# Patient Record
Sex: Female | Born: 1940 | Race: White | Hispanic: No | Marital: Married | Smoking: Former smoker
Health system: Southern US, Community
[De-identification: ages and names within clinical notes are randomized; demographics above are authoritative.]

## PROBLEM LIST (undated history)

## (undated) DIAGNOSIS — D638 Anemia in other chronic diseases classified elsewhere: Secondary | ICD-10-CM

## (undated) DIAGNOSIS — I10 Essential (primary) hypertension: Secondary | ICD-10-CM

## (undated) DIAGNOSIS — E079 Disorder of thyroid, unspecified: Secondary | ICD-10-CM

## (undated) DIAGNOSIS — D510 Vitamin B12 deficiency anemia due to intrinsic factor deficiency: Secondary | ICD-10-CM

## (undated) DIAGNOSIS — J449 Chronic obstructive pulmonary disease, unspecified: Secondary | ICD-10-CM

## (undated) DIAGNOSIS — C3490 Malignant neoplasm of unspecified part of unspecified bronchus or lung: Secondary | ICD-10-CM

## (undated) DIAGNOSIS — N189 Chronic kidney disease, unspecified: Secondary | ICD-10-CM

## (undated) DIAGNOSIS — I714 Abdominal aortic aneurysm, without rupture, unspecified: Secondary | ICD-10-CM

## (undated) DIAGNOSIS — C3411 Malignant neoplasm of upper lobe, right bronchus or lung: Secondary | ICD-10-CM

## (undated) DIAGNOSIS — I639 Cerebral infarction, unspecified: Secondary | ICD-10-CM

## (undated) HISTORY — PX: ABDOMINAL HYSTERECTOMY: SHX81

## (undated) HISTORY — DX: Abdominal aortic aneurysm, without rupture, unspecified: I71.40

## (undated) HISTORY — PX: CHOLECYSTECTOMY: SHX55

## (undated) HISTORY — DX: Malignant neoplasm of unspecified part of unspecified bronchus or lung: C34.90

## (undated) HISTORY — PX: TUBAL LIGATION: SHX77

## (undated) HISTORY — DX: Anemia in other chronic diseases classified elsewhere: D63.8

## (undated) HISTORY — DX: Chronic kidney disease, unspecified: N18.9

## (undated) HISTORY — DX: Cerebral infarction, unspecified: I63.9

## (undated) HISTORY — DX: Abdominal aortic aneurysm, without rupture: I71.4

## (undated) HISTORY — DX: Malignant neoplasm of upper lobe, right bronchus or lung: C34.11

## (undated) HISTORY — PX: APPENDECTOMY: SHX54

---

## 2001-02-21 ENCOUNTER — Encounter: Payer: Self-pay | Admitting: Family Medicine

## 2001-02-21 ENCOUNTER — Ambulatory Visit (HOSPITAL_COMMUNITY): Admission: RE | Admit: 2001-02-21 | Discharge: 2001-02-21 | Payer: Self-pay | Admitting: Family Medicine

## 2001-03-21 ENCOUNTER — Encounter: Payer: Self-pay | Admitting: Family Medicine

## 2001-03-21 ENCOUNTER — Ambulatory Visit (HOSPITAL_COMMUNITY): Admission: RE | Admit: 2001-03-21 | Discharge: 2001-03-21 | Payer: Self-pay | Admitting: Family Medicine

## 2001-04-04 ENCOUNTER — Encounter: Payer: Self-pay | Admitting: General Surgery

## 2001-04-10 ENCOUNTER — Ambulatory Visit (HOSPITAL_COMMUNITY): Admission: RE | Admit: 2001-04-10 | Discharge: 2001-04-10 | Payer: Self-pay | Admitting: General Surgery

## 2002-10-04 ENCOUNTER — Ambulatory Visit (HOSPITAL_COMMUNITY): Admission: RE | Admit: 2002-10-04 | Discharge: 2002-10-04 | Payer: Self-pay | Admitting: Family Medicine

## 2002-10-04 ENCOUNTER — Encounter: Payer: Self-pay | Admitting: Family Medicine

## 2002-10-23 ENCOUNTER — Encounter (HOSPITAL_COMMUNITY): Admission: RE | Admit: 2002-10-23 | Discharge: 2002-11-03 | Payer: Self-pay | Admitting: Orthopedic Surgery

## 2002-11-04 ENCOUNTER — Encounter (HOSPITAL_COMMUNITY): Admission: RE | Admit: 2002-11-04 | Discharge: 2002-12-04 | Payer: Self-pay | Admitting: Orthopedic Surgery

## 2002-11-19 ENCOUNTER — Ambulatory Visit (HOSPITAL_COMMUNITY): Admission: RE | Admit: 2002-11-19 | Discharge: 2002-11-19 | Payer: Self-pay | Admitting: Family Medicine

## 2002-11-19 ENCOUNTER — Encounter: Payer: Self-pay | Admitting: Family Medicine

## 2003-04-13 ENCOUNTER — Inpatient Hospital Stay (HOSPITAL_COMMUNITY): Admission: EM | Admit: 2003-04-13 | Discharge: 2003-04-22 | Payer: Self-pay | Admitting: Emergency Medicine

## 2004-01-14 ENCOUNTER — Ambulatory Visit (HOSPITAL_COMMUNITY): Admission: RE | Admit: 2004-01-14 | Discharge: 2004-01-14 | Payer: Self-pay

## 2004-01-14 ENCOUNTER — Inpatient Hospital Stay (HOSPITAL_COMMUNITY): Admission: AD | Admit: 2004-01-14 | Discharge: 2004-01-17 | Payer: Self-pay | Admitting: Family Medicine

## 2005-06-15 ENCOUNTER — Ambulatory Visit (HOSPITAL_COMMUNITY): Admission: RE | Admit: 2005-06-15 | Discharge: 2005-06-15 | Payer: Self-pay | Admitting: Family Medicine

## 2006-01-12 ENCOUNTER — Ambulatory Visit (HOSPITAL_COMMUNITY): Admission: RE | Admit: 2006-01-12 | Discharge: 2006-01-12 | Payer: Self-pay | Admitting: Family Medicine

## 2006-04-10 ENCOUNTER — Inpatient Hospital Stay (HOSPITAL_COMMUNITY): Admission: EM | Admit: 2006-04-10 | Discharge: 2006-04-23 | Payer: Self-pay | Admitting: Emergency Medicine

## 2007-10-23 ENCOUNTER — Ambulatory Visit (HOSPITAL_COMMUNITY): Admission: RE | Admit: 2007-10-23 | Discharge: 2007-10-23 | Payer: Self-pay | Admitting: Family Medicine

## 2009-02-21 ENCOUNTER — Ambulatory Visit (HOSPITAL_COMMUNITY): Admission: RE | Admit: 2009-02-21 | Discharge: 2009-02-21 | Payer: Self-pay | Admitting: Family Medicine

## 2010-08-21 NOTE — Discharge Summary (Signed)
NAME:  [name], [name]                     ACCOUNT NO.:  [id number]   MEDICAL RECORD NO.:  [id number]          PATIENT TYPE:  INP   LOCATION:  A311                          FACILITY:  [facility]   PHYSICIAN:  Patrica Duel, M.D.    DATE OF BIRTH:  [birth year]   DATE OF ADMISSION:  01/14/2004  DATE OF DISCHARGE:  10/14/2005LH                                 DISCHARGE SUMMARY   DISCHARGE DIAGNOSES:  1.  Severe hypokalemia apparently secondary to long-term hydrochlorothiazide      therapy.  2.  Chronic edema which is most likely secondary to cor pulmonale component      of calcium channel blocker effect.  3.  Severe chronic obstructive pulmonary disease with frequent      exacerbations.  4.  History of hypertension.  5.  History of electrolyte disturbances.  6.  Chronic anemia.  7.  Stable coronary artery disease.   For details regarding admission, please refer to the admitting note but  briefly this 70 year old female with the above history presented to Dr.  [name] for routine follow up.  She complained of some headache and  generalized weakness.  Routine laboratory revealed a sodium of 115.  We  admitted the patient for hyponatremic.  Of note upon admission a repeat MET-  7 revealed a potassium of 3 and a sodium that was further depressed at 110.   COURSE IN THE HOSPITAL:  The patient has done very well with prudent sodium  supplementation and discontinuation of hydrochlorothiazide.  Her respiratory  status has remained stable on her home medications.  She has remained active  and has no complaints at this time.  MET-7 today shows a sodium of 129 and a  potassium 4.5.  Other parameters stable.  Urine osmolality was appropriately  low.  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  Currently she is stable for discharge.   DISPOSITION:  Medications include Diovan 80 mg daily.  We will discontinue  HCTZ as well as Norvasc.  She will continue theophylline 300 mg daily, Xanax  t.i.d. p.r.n., albuterol and Combivent inhalers and Advair 250/50.  She will  be followed and treated expectantly as an outpatient.     Mark   MC/MEDQ  D:  01/17/2004  T:  01/17/2004  Job:  [id number]

## 2010-08-21 NOTE — Op Note (Signed)
[facility]  Patient:    [name], [name] Visit Number: [id number] MRN: [id number]          Service Type: DSU Location: DAY Attending Physician:  Dalia Heading Dictated by:   Franky Macho, M.D. Proc. Date: 04/10/01 Admit Date:  04/10/2001   CC:         Patrica Duel, M.D.   Operative Report  PATIENT AGE:  70 years old.  PREOPERATIVE DIAGNOSIS:  Cholecystitis, cholelithiasis.  POSTOPERATIVE DIAGNOSIS:  Cholecystitis, cholelithiasis.  OPERATION:  Laparoscopic cholecystectomy.  SURGEON:  Franky Macho, M.D.  ASSISTANT:  Arna Snipe, M.D.  ANESTHESIA:  General endotracheal anesthesia.  INDICATIONS:  The patient is a 70 year old white female who presents with biliary colic secondary to cholelithiasis.  The risks and benefits of the procedure including bleeding, infection, hepatobiliary injury, and the possibility of an open procedure were fully explained to the patient who gave informed consent.  DESCRIPTION OF PROCEDURE:  The patient was placed in the supine position. After induction of general endotracheal anesthesia, the abdomen was prepped and draped using the usual sterile technique with Betadine.  A supraumbilical incision was made down to the fascia.  A Veress needle was introduced into the abdominal cavity, and confirmation of placement was done using the saline drop test.  The abdomen was then insufflated with 16 mmHg pressure.  An 11 mm trocar was introduced into the abdominal cavity under direct visualization without difficulty.  The patient was placed in reverse Trendelenburg position, and an additional 11 mm trocar was placed in the epigastric region, and 5 mm trocars were placed in the right upper quadrant and right flank regions.  The liver was inspected and noted to be within normal limits.  The gallbladder was retracted superiorly and laterally.  The dissection was begun around the infundibulum of the gallbladder.  The  cystic duct was first identified.  Its juncture to the infundibulum was fully identified.  Endoclips were placed proximally and distally on the cystic duct, and the cystic duct was divided.  This was likewise done on the cystic artery. The gallbladder was then freed away from the gallbladder fossa using Bovie electrocautery.  The gallbladder was delivered through the epigastric trocar site using Endocatch bag without difficulty.  The gallbladder fossa was inspected, and no abnormal bleeding or bile leakage was noted.  All fluid and air were then evacuated from the abdominal cavity prior to removal of the trocars.  All wounds were irrigated with normal saline.  All wounds were injected with 0.5% Marcaine.  The supraumbilical fascia as well as epigastric fascia were reapproximated using an 0 Vicryl interrupted suture.  All skin incisions were closed using staples.  Betadine ointment and dry sterile dressings were applied.  All tape and needle counts were correct at the end of the procedure.  The patient was extubated in the operating room and went back to the recovery room awake and in stable condition.  COMPLICATIONS:  None.  SPECIMEN:  Gallbladder with stones.  ESTIMATED BLOOD LOSS:  Minimal. Dictated by:   Franky Macho, M.D. Attending Physician:  Dalia Heading DD:  04/10/01 TD:  04/10/01 Job: [id number]

## 2010-08-21 NOTE — H&P (Signed)
[facility]  Patient:    [name], [name] Visit Number: [id number] MRN: [id number]          Service Type: OUT Location: RAD Attending Physician:  Patrica Duel Dictated by:   Franky Macho, M.D. Admit Date:  03/21/2001 Discharge Date: 03/21/2001                           History and Physical  INCOMPLETE  AGE:  70 years old  CHIEF COMPLAINT:  Biliary colic.  HISTORY OF PRESENT ILLNESS:  Patient is a 71 year old white female who was referred for evaluation and treatment of biliary colic.  She has been having right upper quadrant abdominal pain, bloating, fatty food intolerance, and nausea for many months.  No fever, chills, or jaundice have been noted.  The symptoms are now daily in nature.  Prevacid has not been helpful.  PAST MEDICAL HISTORY:  Chronic bronchitis, emphysema, hypertension, hypothyroidism.  PAST SURGICAL HISTORY: Dictated by:   Franky Macho, M.D. Attending Physician:  Patrica Duel DD:  04/04/01 TD:  04/04/01 Job: [id number]

## 2010-08-21 NOTE — Group Therapy Note (Signed)
NAME:  COXZendaya, Groseclose                     ACCOUNT NO.:  [id number]   MEDICAL RECORD NO.:  [id number]          PATIENT TYPE:  INP   LOCATION:  A201                          FACILITY:  [facility]   PHYSICIAN:  Edward L. [name], M.D.DATE OF BIRTH:  08/22/1940   DATE OF PROCEDURE:  DATE OF DISCHARGE:                                 PROGRESS NOTE   Ms. Gruszka continues not doing well.  According to the nursing staff.  They  feel that she did better on Ativan than on Xanax.  Her heart rate has  been up.  She is still very tight.   PHYSICAL EXAM:  Shows temperature is 97.6, pulse 108, respirations 26,  blood pressure 101/70, O2 sats 94% on 3L.   Her white count 8900, hemoglobin is 10.9, platelets 205,000. All that is  stable.  Her sodium is gone down a bit probably because of her Lasix,  BUN is 18, creatinine 0.87, magnesium is 1.4.   ASSESSMENT:  She is very little different.  Continues with significant  problems.  I had a long discussion with her family and explained to them  her overall very bad situation.  I do not plan to change anything today.      Edward L. [name], M.D.  Electronically Signed     ELH/MEDQ  D:  04/17/2006  T:  04/17/2006  Job:  [id number]

## 2010-08-21 NOTE — Procedures (Signed)
[facility]  Patient:    [name], [name] Visit Number: [id number] MRN: [id number]          Service Type: OUT Location: RAD Attending Physician:  Patrica Duel Dictated by:   Kari Baars, M.D. Proc. Date: 04/04/01 Admit Date:  03/21/2001 Discharge Date: 03/21/2001                            EKG Interpretations  TIME:  1356  INTERPRETATION:  Rhythm is a sinus rhythm with a rate in the 80s.  There are T waves inferiorly which may indicate a previous inferior infarction and clinical correlation is suggested.  Q-T interval is prolonged which may be due to drug effect or atrial imbalance or primary myocardial disease.  There are ST-T wave changes inferiorly and laterally which are nonspecific and could indicate ischemia and clinical correlation is suggested.  IMPRESSION:  Abnormal electrocardiogram. Dictated by:   Kari Baars, M.D. Attending Physician:  Patrica Duel DD:  04/04/01 TD:  04/05/01 Job: [id number]

## 2010-08-21 NOTE — Discharge Summary (Signed)
NAME:  [name], [name]                               ACCOUNT NO.:  [id number]   MEDICAL RECORD NO.:  [id number]                   PATIENT TYPE:  INP   LOCATION:  A331                                 FACILITY:  [facility]   PHYSICIAN:  Kirk Ruths, M.D.            DATE OF BIRTH:  Jan 03, 1941   DATE OF ADMISSION:  04/13/2003  DATE OF DISCHARGE:  04/22/2003                                 DISCHARGE SUMMARY   DISCHARGE DIAGNOSES:  1. Exacerbation of chronic obstructive pulmonary disease.  2. Hyponatremia.  3. Hypokalemia.  4. Mild anemia.  5. Tobacco abuse.  6. Coronary artery disease, stable.   HOSPITAL COURSE:  This is a 70 year old white female with the first time she  has required admission for her COPD.  She is a long-term smoker.  Initial  presentation in the emergency room, the patient complained of significant  shortness of breath.  In the emergency room, she was noted to have a sodium  low at 114 and potassium of 4.3.  The patient also had initial blood gases  showing on room air pH 7.41, 64 O2, and 36 CO2.  White count was 2700 on  admission with a hemoglobin of 11.3.  After hydration, the hemoglobin  dropped to 10.3.  Anemia profile was performed which basically was normal.  The patient also underwent a D-dimer which was only at 1.44.  She underwent  CT of chest which ruled out pulmonary embolus.  The patient was begun on IV  steroids, extensive nebulizer treatments, O2, and was seen in consultation  by cardiology, underwent an echocardiogram which showed some mild  nonspecific changes.  Chest x-ray showed COPD with acute disease.  The  patient's sodium improved with hydration, IV fluids, back up to 133, dropped  to 129 at discharge.  Potassium at one time was 3.4, but this was corrected  back within normal range.  It was 4.8 at discharge.  TSH was normal at 1.22.  The patient was slow to respond to treatment, though the lungs sounded  better.  She was extremely short of breath  just walking a few feet to the  bathroom.  She was seen in consultation by Dr. Kari Baars who increased  her Humibid and she was placed on Theo-Dur.  The patient's blood sugars were  very slightly elevated throughout her stay, but all staying under 100.  This  was felt to be secondary to the steroids.  Finally, the patient was able to  maintain a pulse oximetry greater than 90 on room air.  She was still  somewhat short of breath at the time of her discharge.  She was discharged.  Theo-Dur was begun.  Ventolins were discontinued.  Phenergan, Xanax.     ___________________________________________  Kirk Ruths, M.D.   WMM/MEDQ  D:  05/06/2003  T:  05/06/2003  Job:  [id number]

## 2010-08-21 NOTE — Group Therapy Note (Signed)
NAME:  [name], [name]                     ACCOUNT NO.:  [id number]   MEDICAL RECORD NO.:  [id number]          PATIENT TYPE:  INP   LOCATION:  A201                          FACILITY:  [facility]   PHYSICIAN:  Edward L. [name], M.D.DATE OF BIRTH:  [birth year]   DATE OF PROCEDURE:  DATE OF DISCHARGE:                                 PROGRESS NOTE   Ms. Wunder says she does not seem to be breathing as well today.  She is  having more problems.  She he is trying to use her inhalers from home,  and I have asked her not to do that.   PHYSICAL EXAMINATION:  GENERAL:  Her exam this morning shows that she  does not look too uncomfortable but not totally comfortable either.  VITAL SIGNS:  Her temperature is 96.6, pulse 86, respirations 16, blood  pressure 85/52, O2 saturation 92% on 3 liters.   Her BMET this morning shows that her sodium is up to 129.   ASSESSMENT:  She is better.  I agree with Dr. 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   PLAN:  Continue with her medications and treatments and follow.      Edward L. [name], M.D.  Electronically Signed     ELH/MEDQ  D:  04/12/2006  T:  04/12/2006  Job:  [id number]

## 2010-08-21 NOTE — Group Therapy Note (Signed)
NAME:  [name], [name]                     ACCOUNT NO.:  [id number]   MEDICAL RECORD NO.:  [id number]          PATIENT TYPE:  INP   LOCATION:  A201                          FACILITY:  [facility]   PHYSICIAN:  Edward L. [name], M.D.DATE OF BIRTH:  [birth year]   DATE OF PROCEDURE:  DATE OF DISCHARGE:                                 PROGRESS NOTE   [name] is overall better.  She is moving air better.  She says she  wants to go home.  She continues to have some shortness of breath,  continues to require intensive treatment and still has a lot of fluid in  her legs, so it is difficult to say if she is going to be able to go  home anytime real soon.  However, she does look better.   Her exam today shows her temperature is 99.1, pulse 91, respirations 20,  blood pressure 90/56, O2 sat is 96% on 4 L.  CHEST:  Her chest is clearer than it has been and she is moving air  better.  HEART:  Regular.  Her white blood cell count now is down to 7900, hemoglobin 10.1,  platelets 165, sodium is 129, glucose 122.   ASSESSMENT:  She seems to be better.   PLAN:  Continue with her treatment.  She wants to see about getting the  Foley catheter out but she have significant edema and she might need  more Lasix.  I will leave that decision to the primary care team.  Otherwise, continue with her meds and follow.      Edward L. [name], M.D.  Electronically Signed     ELH/MEDQ  D:  04/19/2006  T:  04/19/2006  Job:  [id number]

## 2010-08-21 NOTE — Group Therapy Note (Signed)
NAME:  COXMindel, Friscia                     ACCOUNT NO.:  [id number]   MEDICAL RECORD NO.:  [id number]          PATIENT TYPE:  INP   LOCATION:  A201                          FACILITY:  [facility]   PHYSICIAN:  Edward L. [name], M.D.DATE OF BIRTH:  1940-04-24   DATE OF PROCEDURE:  DATE OF DISCHARGE:                                 PROGRESS NOTE   PROGRESS NOTE   SUBJECTIVE:  Ms. Cornick is overall about the same.  She remains congested  and short of breath.  There has been some confusion about her  respiratory treatments, so Dr. Nobie Putnam has ordered that she get her  treatments every 4 hours around-the-clock, which I think it is entirely  appropriate.   PHYSICAL EXAMINATION:  GENERAL:  Her physical examination today shows  that she is awake and alert, looks somewhat dyspneic.  VITAL SIGNS:  Temperature is 98.2, pulse 83, respirations 20, blood pressure 85/47, O2  sats 98% on 3 liters.  CHEST:  Her chest is clearer than it has been, but she is coughing up  some sputum.   LABORATORY DATA:  Her white count is 5800, hemoglobin is 9.9.  She does  have anemia of chronic disease.  Platelets 230.  Electrolytes show her  potassium 3.4 now, up from 3.1.  Sodium 131 which is about the same,  glucose 125.   MEDICATIONS:  She is off her IV theophylline and is a little less  nauseated.   ASSESSMENT:  She is better but still quite sick.   PLAN:  Continue with treatments and follow.      Edward L. [name], M.D.  Electronically Signed     ELH/MEDQ  D:  04/14/2006  T:  04/14/2006  Job:  [id number]

## 2010-08-21 NOTE — Group Therapy Note (Signed)
NAME:  COXMisheel, Gowans                     ACCOUNT NO.:  [id number]   MEDICAL RECORD NO.:  [id number]          PATIENT TYPE:  INP   LOCATION:  A201                          FACILITY:  [facility]   PHYSICIAN:  Edward L. [name], M.D.DATE OF BIRTH:  01-16-1941   DATE OF PROCEDURE:  04/18/2006  DATE OF DISCHARGE:                                 PROGRESS NOTE   PROBLEMS:  1. End-stage chronic obstructive pulmonary disease.  2. Cor pulmonale.  3. Depression.   SUBJECTIVE:  Ms. Goelz is, I think, a little bit better.  Her heart rate  is a bit better.  She does not seem quite as dyspneic and I think she is  moving air a little better as well.   PHYSICAL EXAMINATION:  VITAL SIGNS:  Her exam this morning shows  temperature is 97.4, pulse has been to 126, but now about 100, her  respirations 20, blood pressure 105/71, O2 sats 90% on 2 liters.  CHEST:  As mentioned, I think she is moving a little bit more air.  HEART:  Regular.   White count 12,800 now, hemoglobin 11.8.  BUN 21, creatinine 0.6.  Sodium is down to 130.   ASSESSMENT:  She is, I think, a little better.   PLAN:  Continue with her current treatments, medications and follow.      Edward L. [name], M.D.  Electronically Signed     ELH/MEDQ  D:  04/18/2006  T:  04/18/2006  Job:  [id number]

## 2010-08-21 NOTE — H&P (Signed)
NAME:  [name], [name]                     ACCOUNT NO.:  [id number]   MEDICAL RECORD NO.:  [id number]          PATIENT TYPE:  INP   LOCATION:  A311                          FACILITY:  [facility]   PHYSICIAN:  Patrica Duel, M.D.    DATE OF BIRTH:  Mar 01, 1941   DATE OF ADMISSION:  01/14/2004  DATE OF DISCHARGE:  LH                                HISTORY & PHYSICAL   CHIEF COMPLAINT:  Staggering.   HISTORY OF PRESENT ILLNESS:  This is a 70 year old female.  She has a  history of severe COPD with frequent exacerbations.  She also has  hypertension and history of hyponatremia as well as hypokalemia, chronic  anemia, and stable coronary artery disease.  She has been doing fairly well  since the discharge in January of this year after having been admitted for  hyponatremia and hypokalemia.   The  patient was seen by Dr. [name] for routine follow-up.  She complained  of some headache and generalized weakness.  He obtained routine labs which  revealed sodium of 115.  He contacted me.  We decided to admit the patient  for sodium replenishment and further evaluation and therapy as indicated.   The patient is remarkably devoid of significant symptoms except for mild  generalized weakness and occasional staggering gait.  She has had no  neurologic deficits, chest pains, increased shortness of breath, nausea,  vomiting, diarrhea, melena, hematemesis, hematochezia, or genitourinary  symptoms.   The patient is admitted for definitive treatment and further evaluation of  extreme hyponatremia.   CURRENT MEDICATIONS:  1.  Diovan/HCT 80/12.5 daily.  2.  K-Dur 20 mEq daily.  3.  Norvasc 5 daily.  4.  Theophylline 300 daily.  5.  Xanax 0.5 mg t.i.d.  6.  Albuterol and Combivent inhalers.  7.  She also takes Advair 250/50 one puff b.i.d.   PAST MEDICAL HISTORY:  As noted above.  She is also status post hysterectomy  as well as appendectomy.   SOCIAL HISTORY:  The patient is a smoker and uses alcohol  socially only.  She denies other substance problems.   ALLERGIES:  1.  PENICILLIN.  2.  CODEINE.   FAMILY HISTORY:  Father died of intestinal problems, mother brain tumor.  Four children are healthy.  One brother has coronary disease.   REVIEW OF SYSTEMS:  Negative except as mentioned.   PHYSICAL EXAMINATION:  GENERAL:  This is a very pleasant, fully alert female  who is in no acute distress.  VITAL SIGNS:  Within normal limits.  HEENT:  Normocephalic, atraumatic.  Pupils are equal.  Ears, nose, and  throat are benign.  There are no audible carotid bruits.  NECK:  Supple.  No masses or thyromegaly noted.  LUNGS:  Reveal very distant breath sounds with very fine and expiratory  wheezes, particularly on the right.  HEART:  Heart sounds are distant, no apparent murmurs, rubs, or gallops.  ABDOMEN:  Nontender, nondistended.  Bowel sounds are intact.  EXTREMITIES:  No clubbing, cyanosis, or edema.  NEUROLOGIC:  Completely nonfocal.   LABORATORY  DATA:  Repeat MET 7:  Sodium is 110, potassium 3, chloride 72,  CO2 28, BUN/creatinine 19 and 1.6 with blood sugar 109.  H&H 11 and 31  respectively.  Alkaline phosphatase 134, theophylline level 9.1.   Chest x-ray obtained earlier today revealed severe COPD with new bibasilar  atelectasis, increased bronchovascular markings.   ASSESSMENT:  Severe hyponatremia, as well as hypokalemia, in 70 year old  female with severe chronic obstructive pulmonary disease.  This is most  likely related to hydrochlorothiazide use.  Consider SIADH or other.  She  specifically denies excessive water intake.   PLAN:  Will use normal saline for very slow sodium replenishment as well as  potassium supplementation.  Continue her home medications except for  discontinuation of hydrochlorothiazide.  Will check serum urine  ___________and follow and treat expectantly.      MC/MEDQ  D:  01/14/2004  T:  01/14/2004  Job:  [id number]

## 2010-08-21 NOTE — Group Therapy Note (Signed)
NAME:  COXNathalee, Smarr                     ACCOUNT NO.:  [id number]   MEDICAL RECORD NO.:  [id number]          PATIENT TYPE:  INP   LOCATION:  A201                          FACILITY:  [facility]   PHYSICIAN:  Edward L. [name], M.D.DATE OF BIRTH:  07-Jan-1941   DATE OF PROCEDURE:  DATE OF DISCHARGE:                                 PROGRESS NOTE   SUBJECTIVE:  Ms. Hargrove says she is feeling fairly well.  She is still  miserable, but she is breathing better.  Her physical examination  shows she does look a bit more comfortable and she is clearly moving  more air than she has been.  She is eating a little bit better as well.   PHYSICAL EXAMINATION:  VITAL SIGNS: Temperature is 98.1, pulse 86 now,  respirations 20, blood pressure 98/55, O2 sats 93% on 4 liters.  CHEST:  Her chest shows decreased breath sounds, but better air  movement.  HEART: Her heart is regular with a little less of a tachycardia.  ABDOMEN:  Her abdomen is soft.  EXTREMITIES:  She still has edema of her extremities mostly at the feet.   LABORATORY DATA:  Her white blood count 7700, hemoglobin is 10.3,  platelets 161, sodium is 130, BUN 22, creatinine 0.74.   ASSESSMENT:  She is, I think, improving. Her daughter is asking about  home care and I have told we can get to discharge planning group to  discuss things with her.  She will clearly need home health and she may  need some equipment.      Edward L. [name], M.D.  Electronically Signed     ELH/MEDQ  D:  04/20/2006  T:  04/20/2006  Job:  [id number]

## 2010-08-21 NOTE — Group Therapy Note (Signed)
NAME:  [name], [name]                     ACCOUNT NO.:  [id number]   MEDICAL RECORD NO.:  [id number]           PATIENT TYPE:   LOCATION:                                 FACILITY:   PHYSICIAN:  Edward L. [name], M.D.DATE OF BIRTH:  23-Jul-1940   DATE OF PROCEDURE:  DATE OF DISCHARGE:                                 PROGRESS NOTE   A patient of [employer], [employer]. [name] says she does not feel  well. She has made fairly slow progress. This morning her O2 sat was  down, but apparently she had her oxygen off but I am not sure for how  long. Her exam this morning shows that she is still sitting up. Temp 97,  pulse 101, respirations 18, blood pressure 121/54, O2 sats 99% on 2  liters, weight yesterday 131.3; today's is pending. Her chest with  decreased breath sounds with no wheezes.  Her white count 6300, hemoglobin 9.9, platelets 235. Sodium is 137,  glucose 125. Calcium 7.6. She looks like she may have some thrush in her  mouth. Her cardiac enzymes done yesterday and today thus far are  negative for any sort of infarction. She does have an elevated troponin,  but I suspect that is related to her severe lung disease.   ASSESSMENT:  My assessment then is that she has end-stage lung disease  not responding particularly well to maximum medical therapy. She has  probably yeast in her mouth. Will go ahead and treat that. She has cor  pulmonale, and she is developing some peripheral edema now. She had  chest tightness which I suspect is also related to her chronic lung  disease, and I think she is getting depressed. I discussed all this at  some length with her daughter today. I do not think that either [name]  or her daughter understands quite how bad her lungs are despite our  multiple discussions partially because she has been able to manage at  home without being in the hospital for sometime. I do not think there is  a great deal else to add at this point. I agree with Dr. Nobie Putnam who  I  discussed her situation with to discontinue IV fluids, give her some  Lasix, and continue everything else and follow.      Edward L. [name], M.D.  Electronically Signed     ELH/MEDQ  D:  04/15/2006  T:  04/15/2006  Job:  [id number]

## 2010-08-21 NOTE — Group Therapy Note (Signed)
NAME:  [name], [name]                     ACCOUNT NO.:  [id number]   MEDICAL RECORD NO.:  [id number]          PATIENT TYPE:  INP   LOCATION:  A201                          FACILITY:  [facility]   PHYSICIAN:  Edward L. [name], M.D.DATE OF BIRTH:  1940/05/01   DATE OF PROCEDURE:  DATE OF DISCHARGE:                                 PROGRESS NOTE   Ms. Sealey is a bit better.  She continues slow improvement.  She has no  new complaints, says she would like to get her Foley catheter out but  not until she stops getting Lasix and I have told her that she is  probably going to continue on Lasix pretty much indefinitely because she  does have significant peripheral edema.  However, when she gets to the  point that she can get up at get to the bedside commode, she could  probably get rid of the catheter anyway.   Her exam today shows she is more awake and alert.  Her temperature is  98.4, pulse 80, respirations 18, blood pressure 98/57, O2 saturation 96%  on to 2-1/2 liters.  Her chest is much clearer.  She is moving air  better.   Her white count 7300, hemoglobin is 10.8, platelets 135,000.  Sodium is  down to 129 now, potassium is 3.5, chloride 79, CO2 43, glucose 118, BUN  24, creatinine 0.7.   ASSESSMENT:  She is overall better.   PLAN:  Continue with her current medications and treatments.  No changes  today.      Edward L. [name], M.D.  Electronically Signed     ELH/MEDQ  D:  04/21/2006  T:  04/21/2006  Job:  [id number]

## 2010-08-21 NOTE — Group Therapy Note (Signed)
NAME:  [name], [name]                     ACCOUNT NO.:  [id number]   MEDICAL RECORD NO.:  [id number]          PATIENT TYPE:  INP   LOCATION:  A201                          FACILITY:  [facility]   PHYSICIAN:  Edward L. [name], M.D.DATE OF BIRTH:  Nov 30, 1940   DATE OF PROCEDURE:  DATE OF DISCHARGE:                                 PROGRESS NOTE   Patient of  [facility].  [name] is overall much better.  She is  feeling better.  She has been eating better and overall has improved.   PHYSICAL EXAMINATION:  Today shows temperature is 97.3, her pulse is 73,  respirations 20, blood pressure 110/56, O2 sat 100% on 1.5 liters.  CHEST:  is clear with decreased breath sounds.  HEART:  Is regular.   ASSESSMENT:  She is much improved.   PLAN:  Dr. Sherwood Gambler has ordered to discontinue her IV medications and  switch her to oral medications, which I think is totally appropriate and  hopefully she will be ready for discharge fairly soon.      Edward L. [name], M.D.  Electronically Signed     ELH/MEDQ  D:  04/22/2006  T:  04/22/2006  Job:  [id number]

## 2010-08-21 NOTE — H&P (Signed)
[facility]  Patient:    Rose, Reese Visit Number: [id number] MRN: [id number]          Service Type: OUT Location: RAD Attending Physician:  Patrica Duel Dictated by:   Franky Macho, M.D. Admit Date:  03/21/2001 Discharge Date: 03/21/2001   CC:         [name], M.D.   History and Physical  AGE:  70 years old.  CHIEF COMPLAINT:  Biliary colic.  HISTORY OF PRESENT ILLNESS:  The patient is a 70 year old white female whose referred for evaluation and treatment of biliary colic. She has been having right upper quadrant abdominal pain, bloating, some fatty food intolerance, nausea for many months. No fever, chills, or jaundice had been noted. The symptoms are now daily in nature. Prevacid has not been helpful.  PAST MEDICAL HISTORY:  Hypothyroidism, hypertension, COPD, chronic bronchitis.  PAST SURGICAL HISTORY:  Hysterectomy in the remote past.  CURRENT MEDICATIONS:  Synthroid 75 mcg p.o. q.d., Norvasc 2.5 mg p.o. q.d., Xopenex 0.63 mg/3 cc three times a day, Combivent two puffs q. 4h p.r.n., albuterol two puffs q. 4h p.r.n., Advair 250/50 one tablet p.o. b.i.d., Levaquin 500 mg p.o. q.d. which she is finishing.  ALLERGIES:  PENICILLIN and CODEINE.  REVIEW OF SYSTEMS:  The patient denies any recent MI, chest pain, CVA, diabetes mellitus. As stated above, she does suffer from emphysema.  The patient does smoke a pack of cigarettes a day. She denies significant alcohol use.  PHYSICAL EXAMINATION:  GENERAL:  The patient is a well-developed, well-nourished, white female in no acute distress.  VITAL SIGNS:  She is afebrile and vital signs are stable.  HEENT:  Reveals no scleral icterus.  LUNGS:  Clear to auscultation with equal breath sounds bilaterally. No wheezing is noted.  HEART:  Reveals a regular rate and rhythm without S3, S4, or murmurs.  ABDOMEN:  Soft and nondistended. She is tender in the right upper quadrant  to palpation. No hepatosplenomegaly, masses, or hernias are identified. Ultrasound of the gallbladder in the past reportedly shows cholelithiasis. Hepatobiliary scan reveals a low gallbladder ejection fraction with reproducible pain with fatty meal.  IMPRESSION:  Chronic cholecystitis, cholelithiasis.  PLAN:  The patient is scheduled for laparoscopic cholecystectomy on April 10, 2001. The risks and benefits of the procedure including bleeding, infection, hepatobiliary injury, and the possibility of an open procedure were fully explained to the patient, who gave informed consent. Dictated by:   Franky Macho, M.D. Attending Physician:  Patrica Duel DD:  04/04/01 TD:  04/04/01 Job: [id number]

## 2010-08-21 NOTE — Procedures (Signed)
NAME:  [name], BASHOR                               ACCOUNT NO.:  [id number]   MEDICAL RECORD NO.:  [id number]                   PATIENT TYPE:  INP   LOCATION:  A223                                 FACILITY:  [facility]   PHYSICIAN:  Nicki Guadalajara, M.D.                  DATE OF BIRTH:  1940/08/22   DATE OF PROCEDURE:  04/18/2003  DATE OF DISCHARGE:                                  ECHOCARDIOGRAM   INDICATIONS FOR PROCEDURE:  Ms. [name] is a 70 year old patient of Dr.  Regino Schultze with a history of hypertension, anemia, as well as hypothyroidism.  She has a history of chronic obstructive pulmonary disease and was admitted  with chronic obstructive pulmonary disease exacerbation. An ECG suggested  possible old inferior infarction, age indeterminate. The patient is referred  for an echocardiogram Doppler study to evaluate left ventricular and right  ventricular function as well as possibility of coronary artery disease.   FINDINGS:  1. Technically, this is an adequate M-mode 2-dimensional comprehensive     echocardiogram Doppler study.  2. There is evidence for concentric left ventricular hypertrophy with wall     thickness measuring 16 mm septally and 12 mm posteriorly. Systolic     function was excellent with an estimated ejection fraction of at least     55%. There were no focal segmental wall motion abnormalities. End     diastolic and end systolic dimensions were 32 and 20 mm, respectively.  3. Left atrial dimension was normal at 36 mm. Right atrium was normal. Right     ventricle was not dilated but was upper normal in size.  4. Aortic root dimension was normal at 34 mm.  5. Aortic valve was trileaflet and delicate. Systolic excursion was     excellent.  6. There was very mild mitral annular calcification. Mitral valve leaflets     were otherwise delicate. Mitral valve inflow signal suggested reduction     in diastolic compliance.  7. There was mild to moderate tricuspid regurgitation.  8.  Pulmonic valve was not well visualized.  9. There were no intra-myocardial masses, thrombi, or effusions seen.   IMPRESSION:  Technically, this is an adequate echocardiogram Doppler study  demonstrating mild concentric left ventricular hypertrophy with normal  systolic function and mild delay in diastolic relaxation. There is evidence  for upper normal right ventricular size with mild to moderate tricuspid  regurgitation and an estimated right ventricular systolic pressure of 38 mm.  There is very mild mitral annular calcification.      ___________________________________________                                            Nicki Guadalajara, M.D.   TK/MEDQ  D:  04/18/2003  T:  04/18/2003  Job:  [id number]

## 2010-08-21 NOTE — Group Therapy Note (Signed)
NAME:  [name], [name]                     ACCOUNT NO.:  [id number]   MEDICAL RECORD NO.:  [id number]          PATIENT TYPE:  INP   LOCATION:  A201                          FACILITY:  [facility]   PHYSICIAN:  Edward L. [name], M.D.DATE OF BIRTH:  07/21/1940   DATE OF PROCEDURE:  04/13/2006  DATE OF DISCHARGE:                                 PROGRESS NOTE   Patient of the [facility].  Ms. Better says she is doing  okay, but she still has problems with nausea.  She just does not seem to  feel as well as she would like, and I have discussed all this with her  family.  This may be from her theophylline.  Her theophylline level is  about 14, so she may be ending up with some problems from that.   Her physical exam today shows she is awake, alert.  Temperature is 96.9,  pulse is 89, respirations 18, blood pressure 103/71, O2 saturation 93%  on 3 liters.  Her weight is 115.5 two days ago.  Her chest is fairly clear with decreased breath sounds.  She just does  not look like she is very comfortable.   Sodium 130, potassium 3.1.   ASSESSMENT:  She just does not seem to feel well.   PLAN:  After discussion with Dr. Sherwood Gambler, I am going to switch her back to  oral theophylline.  I do not think it has helped her a great deal, and  she seems more nauseated.  I will see how she does.  I do not plan to  change anything else today and will have her follow up.      Edward L. [name], M.D.  Electronically Signed     ELH/MEDQ  D:  04/13/2006  T:  04/13/2006  Job:  [id number]

## 2010-08-21 NOTE — Consult Note (Signed)
NAME:  [name], [name]                               ACCOUNT NO.:  [id number]   MEDICAL RECORD NO.:  [id number]                   PATIENT TYPE:  INP   LOCATION:  A223                                 FACILITY:  [facility]   PHYSICIAN:  Edward L. [name], M.D.             DATE OF BIRTH:  04-13-1940   DATE OF CONSULTATION:  DATE OF DISCHARGE:                                   CONSULTATION   REASON FOR CONSULTATION:  Chronic obstructive pulmonary disease.   SUBJECTIVE:  [name] is a 70 year old with a long known history of COPD. She  has been on treatment at home including home nebulizers, Singulair, Advair,  albuterol, and Combivent. She had been seen in Mr. Edison Simon office earlier  in the week before admission after developing increasing shortness of  breath. She was coughing some, just did not feel well, and she has got worse  despite being treated with a Z-Pack. When she came to the emergency room,  she was treated with nebulizers and IV steroids with really no relief. Blood  gas showed a pH of 7.41, pCO2 of 37, pO2 60. She was going to leave the  emergency room and became much worse.   PAST MEDICAL HISTORY:  1. Positive for coronary disease although the extent of that is unknown.  2. She does have hypertension and hypothyroidism.   MEDICATIONS ON ADMISSION:  1. Singulair 10 mg daily.  2. Advair 250/50 one puff b.i.d.  3. Synthroid 75 mcg daily.  4. Diovan 80 mg daily.  5. Dyazide 25 one daily.  6. Norvasc 5 mg daily.   PAST SURGICAL HISTORY:  1. Hysterectomy.  2. Cholecystectomy.   ALLERGIES:  PENICILLIN and CODEINE.   FAMILY HISTORY:  Positive for asthma in a sister. No other known lung  disease.   PHYSICAL EXAMINATION:  GENERAL:  Shows that she is mild respiratory distress  even now.  VITAL SIGNS:  Blood pressure 120/70, pulse 80 and regular, respirations are  18.  HEENT:  Shows her pupils are reactive to light and accommodation.  CHEST:  Shows decreased breath sounds. No  wheezes now.  HEART:  Regular without gallop.  ABDOMEN:  Soft.   ASSESSMENT:  She has chronic obstructive pulmonary disease. She has a very  long smoking history, and this of course may simply be COPD, but she has not  responded at this point as I would expect. She is slightly anemic with a  hemoglobin of 10, white count is 9,400. Her BMET shows her sodium level is  down at 126, her potassium is also down at 3.4. Her potassium may be low  enough to cause her some problem with her energy level. Chest x-ray shows  COPD. CT chest does not show pulmonary embolism which of course is a  diagnostic possibility.   She is just not responding as would be expected but no evidence of a  secondary problem such as pulmonary embolus. We will plan to go ahead with  replacement of her potassium. She is off her hydrochlorothiazide and her  Norvasc now, so we may be able to bring her sodium level up. I am going to  increase her guaifenesin to 1,200 mg b.i.d. I am going to have her get a  nicotine patch and see if that will make a difference.   Thanks for allowing me to see her with you.      ___________________________________________                                            Oneal Deputy. [name], M.D.   ELH/MEDQ  D:  04/17/2003  T:  04/17/2003  Job:  [id number]

## 2010-08-21 NOTE — H&P (Signed)
NAME:  [name], [name]                     ACCOUNT NO.:  [id number]   MEDICAL RECORD NO.:  [id number]          PATIENT TYPE:  INP   LOCATION:  A201                          FACILITY:  [facility]   PHYSICIAN:  Kirk Ruths, M.D.DATE OF BIRTH:  [birth year]   DATE OF ADMISSION:  04/10/2006  DATE OF DISCHARGE:  LH                              HISTORY & PHYSICAL   CHIEF COMPLAINT:  Shortness of breath.   PRESENTING ILLNESS:  This is a 70 year old female with longstanding  severe COPD.  The patient's problems began approximately three weeks ago  with a cold, which seemed to progress.  She also had some vomiting  approximately a week ago.  She states she has been unable to eat, mainly  because she has no appetite.  The patient denies fever.  In the  emergency room, she was found to be hypoxic and somewhat acidotic on her  blood gases.  The patient was admitted for exacerbation of COPD.   PAST MEDICAL HISTORY:  1. Severe COPD.  2. Hypothyroidism.  3. History of hypertension.   She is allergic to DARVOCET and CODEINE.   MEDICATIONS:  1. Albuterol inhaler.  2. Advair Diskus.  3. Synthroid 88 mcg.  4. Iron.  5. Oral theophylline.  6. Potassium.  7. Diovan.  Unsure of the dose at this time.   REVIEW OF SYSTEMS:  Denies chest pain or diarrhea.  She does, as  mentioned above, have no appetite.  She denies leg pain.  She does have  severe dyspnea on exertion and rest.   PHYSICAL EXAMINATION:  GENERAL:  Cachectic female who appears older than  her age, in mild respiratory distress.  VITAL SIGNS:  She is afebrile, blood pressure 120/70, pulse is 100 and  regular, respiratory rate is 24 and slightly labored.  HEENT:  TMs are normal.  Pupils equal, round, and reactive to light and  accommodation.  Oropharynx benign.  NECK:  Supple, without JVD, bruits, or thyromegaly.  LUNGS:  Coarse rhonchi and mild respiratory wheeze.  HEART:  With a regular sinus rhythm, without murmur, gallop, or rub.  ABDOMEN:  Soft, nontender.  EXTREMITIES:  Without clubbing or cyanosis.  There is a trace of edema.  NEUROLOGIC:  Grossly intact.   ASSESSMENT:  1. Exacerbation of chronic obstructive pulmonary disease.  2. Hypothyroidism.  3. History of hypertension.      Kirk Ruths, M.D.  Electronically Signed     WMM/MEDQ  D:  04/11/2006  T:  04/11/2006  Job:  [id number]

## 2010-08-21 NOTE — Consult Note (Signed)
NAME:  COXNatasja, [name]                     ACCOUNT NO.:  [id number]   MEDICAL RECORD NO.:  [id number]          PATIENT TYPE:  INP   LOCATION:  A201                          FACILITY:  [facility]   PHYSICIAN:  Edward L. [name], M.D.DATE OF BIRTH:  10-May-1940   DATE OF CONSULTATION:  04/11/2006  DATE OF DISCHARGE:                                 CONSULTATION   HISTORY OF PRESENT ILLNESS:  Ms. Manalang is a 70 year old with severe COPD  which is O2 dependent and pretty much end stage.  She has been sick for  about 3 weeks, had been trying to keep from going into the hospital, but  developed increasing problems with shortness of breath and came to the  emergency room.  She says that she has been coughing, she has felt  chilly but has not had any chills.  She has not had any documented  fever.  She said she felt like she had fever but she would check and it  was normal.  She has been coughing non-productively.  She has been more  short of breath.  She has had some swelling of her legs.   PAST MEDICAL HISTORY:  1. COPD.  2. Hypertension.  3. Hypothyroidism.  4. She actually also has had a hospitalization for hyponatremia in the      past.   PAST SURGICAL HISTORY:  1. Cholecystectomy.  2. Hysterectomy.   MEDICATIONS AT HOME:  1. Albuterol.  2. Advair.  3. Synthroid.  4. Theophylline.  5. Potassium chloride.  6. Vitamin B-12.   SOCIAL HISTORY:  She has about a 60 pack-year smoking history but  stopped some years ago.  She does not drink any alcohol.  She does not  use any other illicit drugs.   ALLERGIES:  1. CODEINE.  2. DARVOCET.   FAMILY HISTORY:  Negative for COPD.   PHYSICAL EXAMINATION:  GENERAL:  A thin female who does not appear to be  in acute distress but does look chronically ill.  VITAL SIGNS:  Her temperature 97, pulse is 90, respirations 16, blood  pressure 97/60, O2 saturation 88% on 2 liters, height 63 inches, weight  115.  HEENT:  Her pupils are reactive.  Her nose and  throat are clear.  Mucous  membranes are slightly dry.  NECK:  Supple without masses.  CHEST:  Rhonchi bilaterally; decreased breath sounds in general.  HEART:  Regular without gallop.  ABDOMEN:  Soft.  EXTREMITIES:  Showed 2+ edema bilaterally, and her CNS is grossly  intact.   LABORATORY WORK:  Lab work this morning revealed a sodium of 120.  She  is again hypokalemic.  Potassium 5, chloride 91, CO2 of 24, glucose 129,  BUN 17, creatinine 0.9.  CBC shows white count of 9600, hemoglobin of  12.1, platelets 237.  Blood gas yesterday revealed pH of 7.21, pCO2 of  62, pO2 of 59.  D-dimer was 0.73.  BNP was 224.  Her sodium yesterday  was 117.   ASSESSMENT/PLAN:  She has severe chronic obstructive pulmonary disease  with exacerbation.  She has  again become hyponatremic.  She has an  elevated D-dimer, and Dr. Regino Schultze has ordered a CT chest.  She is on  standard treatment.  I am going to add Advair.  I had originally planned  to give her 2 doses of Lasix, but with her sodium being as low as it is,  we probably ought to hold that.  The plan then is to continue with  treatments otherwise and follow.      Edward L. [name], M.D.  Electronically Signed     ELH/MEDQ  D:  04/11/2006  T:  04/11/2006  Job:  [id number]

## 2010-08-21 NOTE — Group Therapy Note (Signed)
NAME:  [name], [name]                               ACCOUNT NO.:  [id number]   MEDICAL RECORD NO.:  [id number]                   PATIENT TYPE:  INP   LOCATION:  A223                                 FACILITY:  [facility]   PHYSICIAN:  Edward L. [name], M.D.             DATE OF BIRTH:  Oct 15, 1940   DATE OF PROCEDURE:  DATE OF DISCHARGE:                                   PROGRESS NOTE   PROBLEM:  Chronic obstructive pulmonary disease with exacerbation and  stiffness.   SUBJECTIVE:  [name] says she feels much better today.  She has no new  complaints.  She says she feels like getting up and moving around, and she  is moving air better today as well.  Her exam shows that her temperature is  98.6, pulse 95, respirations 20.  Blood pressure 102/63.  O2 saturations is  94% on two liters.  Chest is clearer, and she is definitely moving more air.   ASSESSMENT:  She seems to be improving.   PLAN:  Continue her treatments and follow.      ___________________________________________                                            Oneal Deputy [name], M.D.   ELH/MEDQ  D:  04/19/2003  T:  04/19/2003  Job:  [id number]

## 2010-08-21 NOTE — Discharge Summary (Signed)
NAME:  [name], [name]                     ACCOUNT NO.:  [id number]   MEDICAL RECORD NO.:  [id number]          PATIENT TYPE:  INP   LOCATION:  A201                          FACILITY:  [facility]   PHYSICIAN:  Kirk Ruths, M.D.DATE OF BIRTH:  1940/04/30   DATE OF ADMISSION:  04/10/2006  DATE OF DISCHARGE:  01/19/2008LH                               DISCHARGE SUMMARY   DISCHARGE DIAGNOSES:  1. Pneumonia.  2. 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  3. Hyponatremia.  4. Hypokalemia.  5. Oral thrush.  6. Hypothyroidism.  7. Hypertension controlled.  8. Mild steroid induced diabetes.   HOSPITAL COURSE:  This 70 year old female was admitted with a several-  day history of progressive respiratory infection. On admission patient's  chest x-ray showed COPD, possible pneumonia. She was admitted and begun  on intravenous antibiotics, IV steroids, nebulizations, albuterol. The  patient had a low sodium of 117 on admission thus was improved with IV  therapy. Patient also had a slightly elevated D-dimer of 0.73.  Subsequent CT showed no PE, just severe COPD. The patient was slow to  improve. Dr. [name] of pulmonology was consulted and added Lasix to her  regimen for her cor pulmonale. The patient also was on oral theophylline  with therapeutic levels. The patient also had some low blood pressures  during her stay, off and on evidence of pulmonary edema secondary to cor  pulmonale which responded to Lasix but we had difficulty maintaining her  sodium above 130. The patient was receiving all pulmonary modalities  known without significant improvement but she finally did start to  respond slightly and finally had reached maximum hospital benefit, back  to her baseline. She was discharged to home with Home Health, continued  on the following.   DISCHARGE MEDICINE:  1. Albuterol inhaler.  2. Advair Diskus.  3. Synthroid.  4.Theophylline 200 mg daily.  1. Potassium  supplement.  2. Diflucan for a week.  3. Nystatin suspension for a week.  4. Lasix 20 daily.  5. Protonix 40 daily.  6. She is also treated for depression with Zoloft 50 daily.   FOLLOW UP:  The patient is to be followed in the office as needed. Home  Health will be following mainly.      Kirk Ruths, M.D.  Electronically Signed     WMM/MEDQ  D:  05/23/2006  T:  05/23/2006  Job:  [id number]

## 2010-08-21 NOTE — Group Therapy Note (Signed)
NAME:  [name], [name]                               ACCOUNT NO.:  [id number]   MEDICAL RECORD NO.:  [id number]                   PATIENT TYPE:  INP   LOCATION:  A223                                 FACILITY:  [facility]   PHYSICIAN:  Edward L. [name], M.D.             DATE OF BIRTH:  March 19, 1941   DATE OF PROCEDURE:  DATE OF DISCHARGE:                                   PROGRESS NOTE   PROBLEM:  COPD with exacerbation.   SUBJECTIVE:  Ms. Shoberg says she is better and has no new complaints.  She  denies any nausea or vomiting, fever or chills but she is still short of  breath.   PHYSICAL EXAMINATION:  Her physical examination today shows that her chest  is clearer with decreased breath sounds, no wheezes but she just does not  feel much better.   ASSESSMENT:  She still has problems with COPD.   PLAN:  No changes in her treatment today.  She is already on nebulizers, she  is on inhaled bronchodilators, steroids, etc.  I do not think there is  __________ to add.  I plan to continue her treatments and follow.      ___________________________________________                                            Oneal Deputy [name], M.D.   ELH/MEDQ  D:  04/18/2003  T:  04/18/2003  Job:  [id number]

## 2010-08-21 NOTE — H&P (Signed)
NAME:  [name], [name]                               ACCOUNT NO.:  [id number]   MEDICAL RECORD NO.:  [id number]                   PATIENT TYPE:  INP   LOCATION:  A223                                 FACILITY:  [facility]   PHYSICIAN:  Kirk Ruths, M.D.            DATE OF BIRTH:  [birth year]   DATE OF ADMISSION:  04/13/2003  DATE OF DISCHARGE:                                HISTORY & PHYSICAL   CHIEF COMPLAINT:  Short of breath.   PRESENTING ILLNESS:  This is a 70 year old white female with a longstanding  history of COPD for which she is on home nebulizers as well as Singulair and  Advair. The patient was seen in the office earlier in the week, treated for  bronchitis with Z-Pak and has continued to get more distant. She was seen in  the emergency room and treated with nebulizer treatments and IV steroids  without significant relief. Her blood gases on room air were pH 7.41, pCO2  of 37 and an pO2 of 60.  After the patient had been treated, she arose to  leave the ER and she got acutely dyspneic and sweaty. She is admitted for  further therapy for her exacerbation of COPD.   PAST MEDICAL HISTORY:  She has history of hyperthyroidism, history of  coronary artery disease, although denies MI, history of hypertension.   MEDICATIONS:  1. Singulair 10.  2. Advair 250.  3. Synthroid 75 mcg daily.  4. Diovan 80 mg daily.  5. Diazide 25 daily.  6. Norvasc 500 mg daily.  7. Recently has been on Zithromax.   PAST SURGICAL HISTORY:  The patient is also status post hysterectomy and  cholecystectomy.   ALLERGIES:  The patient is allergic to PENICILLIN and CODEINE.   PHYSICAL EXAMINATION:  GENERAL:  An elderly, white female who is in mild  respiratory distress.  HEENT:  TMs are normal.  Pupils are equal, and reacted to light and  accommodation. Oropharynx benign.  NECK:  Supple without JVD, bruits or thyromegaly.  LUNGS:  With some mild expiratory wheezes.  HEART:  Irregular sinus rhythm  without murmur, gallop, or rub.  ABDOMEN:  Soft and nontender.  EXTREMITIES:  Without clubbing, cyanosis, or edema.  NEUROLOGIC:  Examination is grossly intact.   ASSESSMENT:  1. Exacerbation of COPD.  2. History of hypertension.  3. History of coronary artery disease.  4. History of hypothyroidism.     ___________________________________________                                         Kirk Ruths, M.D.   WMM/MEDQ  D:  04/14/2003  T:  04/14/2003  Job:  [id number]

## 2010-08-21 NOTE — Group Therapy Note (Signed)
NAME:  [name], [name]                     ACCOUNT NO.:  [id number]   MEDICAL RECORD NO.:  [id number]          PATIENT TYPE:  INP   LOCATION:  A201                          FACILITY:  [facility]   PHYSICIAN:  Edward L. [name], M.D.DATE OF BIRTH:  27-Oct-1940   DATE OF PROCEDURE:  DATE OF DISCHARGE:                                 PROGRESS NOTE   [name] is still seriously short of breath, congested and depressed.  Rose pulse is in the 110 range, temperature is 97.8, respirations about  20, blood pressure 136/72, O2 sats 94% on 4 liters.  Rose chest shows  decreased breath sounds.  She is not moving much air.  Rose heart is  regular with a tachycardia.  She has less edema of Rose legs.  Rose white  count is 6400, hemoglobin is 10.2.  Electrolytes were normal.  Glucose  127.   ASSESSMENT:  She has severe end-stage chronic obstructive pulmonary  disease with exacerbation.  She is having a very slow response to  treatment.  She is already on steroids, antibiotics, inhaled  bronchodilators, Diflucan, Lasix, guaifenesin none of which seems to be  making a great deal of difference.  She is clearly depressed and I have  asked Rose to start on Zoloft and will plan to continue with Rose other  treatments in the meantime.  Unfortunately, I do not have a lot to add.  I am going to have Rose try a flutter valve from respiratory to see if it  makes any difference in Rose ability to cough stuff up and I reminded she  and [name] about using pursed lip breathing.      Edward L. [name], M.D.  Electronically Signed     ELH/MEDQ  D:  04/16/2006  T:  04/16/2006  Job:  [id number]

## 2011-03-10 ENCOUNTER — Other Ambulatory Visit (HOSPITAL_COMMUNITY): Payer: Self-pay | Admitting: Family Medicine

## 2011-03-10 DIAGNOSIS — Z139 Encounter for screening, unspecified: Secondary | ICD-10-CM

## 2011-03-11 ENCOUNTER — Telehealth: Payer: Self-pay

## 2011-03-11 NOTE — Telephone Encounter (Signed)
Pt called. Said she is not having any problems and no family hx of colon cancer. She wants to check with her insurance and see how much it will pay, and she said she will call me back sometime next week.

## 2011-03-11 NOTE — Telephone Encounter (Signed)
LMOM for pt to call. 

## 2011-03-15 ENCOUNTER — Ambulatory Visit (HOSPITAL_COMMUNITY)
Admission: RE | Admit: 2011-03-15 | Discharge: 2011-03-15 | Disposition: A | Discharge status: Home / Self Care | Payer: Medicare HMO | Source: Ambulatory Visit | Attending: Family Medicine | Admitting: Family Medicine

## 2011-03-15 DIAGNOSIS — M899 Disorder of bone, unspecified: Secondary | ICD-10-CM | POA: Insufficient documentation

## 2011-03-15 DIAGNOSIS — Z780 Asymptomatic menopausal state: Secondary | ICD-10-CM | POA: Insufficient documentation

## 2011-03-15 DIAGNOSIS — Z139 Encounter for screening, unspecified: Secondary | ICD-10-CM

## 2011-03-23 NOTE — Telephone Encounter (Signed)
Letter mailed for pt to call.  

## 2011-10-12 ENCOUNTER — Telehealth: Payer: Self-pay

## 2011-10-12 NOTE — Telephone Encounter (Signed)
LM for pt to call

## 2011-11-03 NOTE — Telephone Encounter (Signed)
LMOM for pt to call. 

## 2011-11-04 ENCOUNTER — Telehealth: Payer: Self-pay

## 2011-11-04 ENCOUNTER — Other Ambulatory Visit: Payer: Self-pay

## 2011-11-04 DIAGNOSIS — Z139 Encounter for screening, unspecified: Secondary | ICD-10-CM

## 2011-11-04 MED ORDER — PEG 3350-KCL-NA BICARB-NACL 420 G PO SOLR: ORAL | Status: AC

## 2011-11-04 NOTE — Telephone Encounter (Signed)
Rx and instructions mailed to pt.  

## 2011-11-04 NOTE — Telephone Encounter (Signed)
Ok to schedule.

## 2011-11-04 NOTE — Telephone Encounter (Signed)
Faxed note to PCP.

## 2011-11-04 NOTE — Telephone Encounter (Signed)
Gastroenterology Pre-Procedure Form    Request Date: 11/04/2011      Requesting Physician: Dr. Sherwood Gambler     PATIENT INFORMATION:  [name] is a 71 y.o., female (DOB=[birth year]).  PROCEDURE: Procedure(s) requested: colonoscopy Procedure Reason: screening for colon cancer  PATIENT REVIEW QUESTIONS: The patient reports the following:   1. Diabetes Melitis: no 2. Joint replacements in the past 12 months: no 3. Major health problems in the past 3 months: no 4. Has an artificial valve or MVP:no 5. Has been advised in past to take antibiotics in advance of a procedure like teeth cleaning: no}    MEDICATIONS & ALLERGIES:    Patient reports the following regarding taking any blood thinners:   Plavix? no Aspirin?yes  Coumadin?  no  Patient confirms/reports the following medications:  Current Outpatient Prescriptions  Medication Sig Dispense Refill  . Aclidinium Bromide (TUDORZA PRESSAIR) 400 MCG/ACT AEPB Inhale into the lungs 2 (two) times daily.      Marland Kitchen albuterol (PROVENTIL) (2.5 MG/3ML) 0.083% nebulizer solution Take 2.5 mg by nebulization 4 (four) times daily.      Marland Kitchen ALPRAZolam (XANAX) 1 MG tablet Take 1 mg by mouth at bedtime as needed. Pt takes 1/2 - 1 tablet up to three times daily      . aspirin 81 MG tablet Take 81 mg by mouth daily.      . Fluticasone-Salmeterol (ADVAIR) 250-50 MCG/DOSE AEPB Inhale 1 puff into the lungs every 12 (twelve) hours.      Marland Kitchen guaiFENesin (MUCINEX) 600 MG 12 hr tablet Take 600 mg by mouth once.      Marland Kitchen ibuprofen (ADVIL,MOTRIN) 200 MG tablet Take 200 mg by mouth every 6 (six) hours as needed.      Marland Kitchen levothyroxine (SYNTHROID, LEVOTHROID) 88 MCG tablet Take 88 mcg by mouth daily.      . NON FORMULARY OXYGEN      24/7      . NON FORMULARY Vitamin B12 injection     monthly      . NON FORMULARY OTC Allergy Relief  10mg     24 hour relief      . potassium chloride (KLOR-CON) 20 MEQ packet Take 20 mEq by mouth daily.      . theophylline (THEODUR) 200 MG 12 hr tablet  Take 200 mg by mouth 2 (two) times daily.        Patient confirms/reports the following allergies:  Allergies  Allergen Reactions  . Codeine Nausea And Vomiting    Projectile vomiting  . Penicillins Rash    Patient is appropriate to schedule for requested procedure(s): yes  AUTHORIZATION INFORMATION Primary Insurance:   ID #:   Group #:  Pre-Cert / Auth required: Pre-Cert / Auth #:   Secondary Insurance:   ID #:  Group #:  Pre-Cert / Auth required: Pre-Cert / Auth #:   No orders of the defined types were placed in this encounter.    SCHEDULE INFORMATION: Procedure has been scheduled as follows:  Date: 11/29/2011   Time: 1:00 PM  Location: [facility] Short Stay  This Gastroenterology Pre-Precedure Form is being routed to the following provider(s) for review: R. Roetta Sessions, MD

## 2011-11-15 ENCOUNTER — Encounter (HOSPITAL_COMMUNITY): Payer: Self-pay | Admitting: Pharmacy Technician

## 2011-11-29 ENCOUNTER — Ambulatory Visit (HOSPITAL_COMMUNITY): Admission: RE | Admit: 2011-11-29 | Payer: Medicare HMO | Source: Ambulatory Visit | Admitting: Internal Medicine

## 2011-11-29 ENCOUNTER — Encounter (HOSPITAL_COMMUNITY): Admission: RE | Payer: Self-pay | Source: Ambulatory Visit

## 2011-11-29 SURGERY — COLONOSCOPY
Anesthesia: Moderate Sedation

## 2012-03-07 ENCOUNTER — Inpatient Hospital Stay (HOSPITAL_COMMUNITY): Payer: Medicare HMO

## 2012-03-07 ENCOUNTER — Inpatient Hospital Stay (HOSPITAL_COMMUNITY)
Admission: EM | Admit: 2012-03-07 | Discharge: 2012-03-24 | DRG: 164 | Disposition: A | Discharge status: Home / Self Care | Payer: Medicare HMO | Attending: Emergency Medicine | Admitting: Emergency Medicine

## 2012-03-07 ENCOUNTER — Other Ambulatory Visit: Payer: Self-pay

## 2012-03-07 ENCOUNTER — Ambulatory Visit (HOSPITAL_COMMUNITY)
Admission: RE | Admit: 2012-03-07 | Discharge: 2012-03-07 | Disposition: A | Discharge status: Home / Self Care | Payer: Medicare HMO | Source: Ambulatory Visit | Attending: Pulmonary Disease | Admitting: Pulmonary Disease

## 2012-03-07 ENCOUNTER — Other Ambulatory Visit (HOSPITAL_COMMUNITY): Payer: Self-pay | Admitting: Pulmonary Disease

## 2012-03-07 ENCOUNTER — Encounter (HOSPITAL_COMMUNITY): Payer: Self-pay | Admitting: Emergency Medicine

## 2012-03-07 ENCOUNTER — Emergency Department (HOSPITAL_COMMUNITY): Payer: Medicare HMO

## 2012-03-07 DIAGNOSIS — K5900 Constipation, unspecified: Secondary | ICD-10-CM | POA: Diagnosis present

## 2012-03-07 DIAGNOSIS — I10 Essential (primary) hypertension: Secondary | ICD-10-CM | POA: Diagnosis present

## 2012-03-07 DIAGNOSIS — J930 Spontaneous tension pneumothorax: Secondary | ICD-10-CM

## 2012-03-07 DIAGNOSIS — J9383 Other pneumothorax: Secondary | ICD-10-CM | POA: Insufficient documentation

## 2012-03-07 DIAGNOSIS — R0602 Shortness of breath: Secondary | ICD-10-CM

## 2012-03-07 DIAGNOSIS — Z79899 Other long term (current) drug therapy: Secondary | ICD-10-CM

## 2012-03-07 DIAGNOSIS — J4489 Other specified chronic obstructive pulmonary disease: Secondary | ICD-10-CM | POA: Diagnosis present

## 2012-03-07 DIAGNOSIS — J449 Chronic obstructive pulmonary disease, unspecified: Secondary | ICD-10-CM | POA: Diagnosis present

## 2012-03-07 DIAGNOSIS — J939 Pneumothorax, unspecified: Secondary | ICD-10-CM

## 2012-03-07 DIAGNOSIS — D62 Acute posthemorrhagic anemia: Secondary | ICD-10-CM | POA: Diagnosis present

## 2012-03-07 DIAGNOSIS — Z87891 Personal history of nicotine dependence: Secondary | ICD-10-CM

## 2012-03-07 DIAGNOSIS — Z9981 Dependence on supplemental oxygen: Secondary | ICD-10-CM

## 2012-03-07 DIAGNOSIS — E876 Hypokalemia: Secondary | ICD-10-CM | POA: Diagnosis present

## 2012-03-07 DIAGNOSIS — J90 Pleural effusion, not elsewhere classified: Secondary | ICD-10-CM | POA: Insufficient documentation

## 2012-03-07 DIAGNOSIS — Z7982 Long term (current) use of aspirin: Secondary | ICD-10-CM

## 2012-03-07 HISTORY — DX: Chronic obstructive pulmonary disease, unspecified: J44.9

## 2012-03-07 HISTORY — DX: Essential (primary) hypertension: I10

## 2012-03-07 HISTORY — DX: Disorder of thyroid, unspecified: E07.9

## 2012-03-07 HISTORY — DX: Vitamin B12 deficiency anemia due to intrinsic factor deficiency: D51.0

## 2012-03-07 LAB — CBC WITH DIFFERENTIAL/PLATELET
Basophils Absolute: 0 10*3/uL (ref 0.0–0.1)
Basophils Relative: 0 % (ref 0–1)
Eosinophils Relative: 1 % (ref 0–5)
HCT: 35.8 % — ABNORMAL LOW (ref 36.0–46.0)
MCHC: 33.5 g/dL (ref 30.0–36.0)
MCV: 84.8 fL (ref 78.0–100.0)
Monocytes Absolute: 0.6 10*3/uL (ref 0.1–1.0)
RDW: 13.2 % (ref 11.5–15.5)

## 2012-03-07 LAB — BASIC METABOLIC PANEL
Calcium: 9.6 mg/dL (ref 8.4–10.5)
Creatinine, Ser: 0.82 mg/dL (ref 0.50–1.10)
GFR calc Af Amer: 82 mL/min — ABNORMAL LOW (ref >90)

## 2012-03-07 MED ORDER — GUAIFENESIN ER 600 MG PO TB12
600.0000 mg | ORAL_TABLET | Freq: Two times a day (BID) | ORAL | Status: DC
  Administered 2012-03-08 – 2012-03-24 (×32): 600 mg via ORAL
  Filled 2012-03-07 (×36): qty 1

## 2012-03-07 MED ORDER — LEVALBUTEROL HCL 0.63 MG/3ML IN NEBU
0.6300 mg | INHALATION_SOLUTION | Freq: Four times a day (QID) | RESPIRATORY_TRACT | Status: DC | PRN
  Administered 2012-03-08: 0.63 mg via RESPIRATORY_TRACT
  Filled 2012-03-07: qty 3

## 2012-03-07 MED ORDER — HYDROCODONE-ACETAMINOPHEN 5-325 MG PO TABS
1.0000 | ORAL_TABLET | ORAL | Status: DC | PRN
  Administered 2012-03-07: 2 via ORAL
  Administered 2012-03-08 – 2012-03-09 (×5): 1 via ORAL
  Administered 2012-03-10 (×2): 2 via ORAL
  Administered 2012-03-10 – 2012-03-11 (×4): 1 via ORAL
  Administered 2012-03-12 (×3): 2 via ORAL
  Administered 2012-03-12 – 2012-03-13 (×3): 1 via ORAL
  Administered 2012-03-13: 2 via ORAL
  Administered 2012-03-13 – 2012-03-16 (×8): 1 via ORAL
  Administered 2012-03-16 – 2012-03-17 (×3): 2 via ORAL
  Filled 2012-03-07: qty 1
  Filled 2012-03-07: qty 2
  Filled 2012-03-07 (×2): qty 1
  Filled 2012-03-07: qty 2
  Filled 2012-03-07 (×2): qty 1
  Filled 2012-03-07: qty 2
  Filled 2012-03-07 (×4): qty 1
  Filled 2012-03-07: qty 2
  Filled 2012-03-07 (×4): qty 1
  Filled 2012-03-07 (×4): qty 2
  Filled 2012-03-07: qty 1
  Filled 2012-03-07 (×2): qty 2
  Filled 2012-03-07: qty 1
  Filled 2012-03-07: qty 2
  Filled 2012-03-07 (×2): qty 1
  Filled 2012-03-07 (×2): qty 2
  Filled 2012-03-07: qty 1

## 2012-03-07 MED ORDER — ADULT MULTIVITAMIN W/MINERALS CH
1.0000 | ORAL_TABLET | Freq: Every day | ORAL | Status: DC
  Filled 2012-03-07: qty 1

## 2012-03-07 MED ORDER — TIOTROPIUM BROMIDE MONOHYDRATE 18 MCG IN CAPS
18.0000 ug | ORAL_CAPSULE | Freq: Every day | RESPIRATORY_TRACT | Status: DC
  Administered 2012-03-08 – 2012-03-24 (×15): 18 ug via RESPIRATORY_TRACT
  Filled 2012-03-07 (×3): qty 5

## 2012-03-07 MED ORDER — MOMETASONE FURO-FORMOTEROL FUM 100-5 MCG/ACT IN AERO
2.0000 | INHALATION_SPRAY | Freq: Two times a day (BID) | RESPIRATORY_TRACT | Status: DC
  Administered 2012-03-08 – 2012-03-24 (×30): 2 via RESPIRATORY_TRACT
  Filled 2012-03-07: qty 8.8

## 2012-03-07 MED ORDER — THIAMINE HCL 100 MG/ML IJ SOLN
Freq: Once | INTRAVENOUS | Status: AC
  Administered 2012-03-08: 07:00:00 via INTRAVENOUS
  Filled 2012-03-07: qty 1000

## 2012-03-07 MED ORDER — DOCUSATE SODIUM 100 MG PO CAPS
100.0000 mg | ORAL_CAPSULE | Freq: Two times a day (BID) | ORAL | Status: DC
  Administered 2012-03-08 – 2012-03-16 (×17): 100 mg via ORAL
  Filled 2012-03-07 (×16): qty 1

## 2012-03-07 MED ORDER — ENOXAPARIN SODIUM 40 MG/0.4ML ~~LOC~~ SOLN
40.0000 mg | Freq: Every day | SUBCUTANEOUS | Status: DC
  Filled 2012-03-07 (×2): qty 0.4

## 2012-03-07 MED ORDER — PROMETHAZINE HCL 25 MG PO TABS
12.5000 mg | ORAL_TABLET | Freq: Four times a day (QID) | ORAL | Status: DC | PRN
  Filled 2012-03-07 (×4): qty 1

## 2012-03-07 MED ORDER — ACETAMINOPHEN 650 MG RE SUPP: 650.0000 mg | Freq: Four times a day (QID) | RECTAL | Status: DC | PRN

## 2012-03-07 MED ORDER — SENNA 8.6 MG PO TABS
1.0000 | ORAL_TABLET | Freq: Two times a day (BID) | ORAL | Status: DC
  Administered 2012-03-09 – 2012-03-16 (×13): 8.6 mg via ORAL
  Filled 2012-03-07 (×21): qty 1

## 2012-03-07 MED ORDER — SODIUM CHLORIDE 0.9 % IJ SOLN
3.0000 mL | Freq: Two times a day (BID) | INTRAMUSCULAR | Status: DC
  Administered 2012-03-08 – 2012-03-16 (×18): 3 mL via INTRAVENOUS

## 2012-03-07 MED ORDER — ALPRAZOLAM 0.5 MG PO TABS
0.5000 mg | ORAL_TABLET | Freq: Three times a day (TID) | ORAL | Status: DC
  Administered 2012-03-08 – 2012-03-15 (×19): 0.5 mg via ORAL
  Filled 2012-03-07 (×19): qty 1

## 2012-03-07 MED ORDER — ACETAMINOPHEN 325 MG PO TABS: 650.0000 mg | ORAL_TABLET | Freq: Four times a day (QID) | ORAL | Status: DC | PRN

## 2012-03-07 MED ORDER — LEVOTHYROXINE SODIUM 88 MCG PO TABS
88.0000 ug | ORAL_TABLET | Freq: Every day | ORAL | Status: DC
  Administered 2012-03-09 – 2012-03-24 (×15): 88 ug via ORAL
  Filled 2012-03-07 (×20): qty 1

## 2012-03-07 MED ORDER — IOHEXOL 300 MG/ML  SOLN
75.0000 mL | Freq: Once | INTRAMUSCULAR | Status: AC | PRN
  Administered 2012-03-07: 75 mL via INTRAVENOUS

## 2012-03-07 MED ORDER — LEVALBUTEROL HCL 0.63 MG/3ML IN NEBU
0.6300 mg | INHALATION_SOLUTION | Freq: Four times a day (QID) | RESPIRATORY_TRACT | Status: DC
  Administered 2012-03-08 – 2012-03-14 (×23): 0.63 mg via RESPIRATORY_TRACT
  Filled 2012-03-07 (×31): qty 3

## 2012-03-07 MED ORDER — ASPIRIN EC 81 MG PO TBEC
81.0000 mg | DELAYED_RELEASE_TABLET | Freq: Every day | ORAL | Status: DC
  Administered 2012-03-09 – 2012-03-24 (×15): 81 mg via ORAL
  Filled 2012-03-07 (×18): qty 1

## 2012-03-07 NOTE — Procedures (Signed)
Chest Tube Insertion Procedure Note  Indications:  Clinically significant Pneumothorax - right spontaneous  Pre-operative Diagnosis: Pneumothorax-right  Post-operative Diagnosis: Pneumothorax-right  Procedure Details  Informed consent was obtained for the procedure, including sedation.  Risks of lung perforation, hemorrhage, arrhythmia, and adverse drug reaction were discussed.   After sterile skin prep, using standard technique, a 8 French tube was placed in the right lateral 4th rib space.  Findings: None  Estimated Blood Loss:  Minimal         Specimens:  None              Complications:  None; patient tolerated the procedure well.         Disposition: Med-surg floor         Condition: stable  Attending Attestation: I performed the procedure.  CXR pending.  Airleak present at end of procedure.  Connected to -20cm suction.

## 2012-03-07 NOTE — ED Notes (Signed)
Maudie Flakes, MD at bedside

## 2012-03-07 NOTE — Progress Notes (Signed)
  Subjective: severe COPD on home O2 No prior hx spont pneumothorax Increased SOB for 4 weeks Quit smokes > 2 yrs ago R basilar PNTX noted today treated with small catheter @ [facility] but PNTX still present  Objective: Vital signs in last 24 hours: Temp:  [98 F (36.7 C)-98.3 F (36.8 C)] 98.3 F (36.8 C) (12/03 2014) Pulse Rate:  [94-110] 97  (12/03 2045) Cardiac Rhythm:  [-]  Resp:  [18-30] 24  (12/03 2045) BP: (137-193)/(68-97) 164/84 mmHg (12/03 2045) SpO2:  [91 %-99 %] 96 % (12/03 2045) Weight:  [125 lb (56.7 kg)] 125 lb (56.7 kg) (12/03 1452)  Hemodynamic parameters for last 24 hours:  stable  Intake/Output from previous day:   Intake/Output this shift:    No airleak from chest catheter Dec breath sounds bilaterally, no subQ air Sinus tach Neuro intact  Lab Results:  Basename 03/07/12 1508  WBC 6.3  HGB 12.0  HCT 35.8*  PLT 187   BMET:  Basename 03/07/12 1508  NA 138  K 4.3  CL 96  CO2 32  GLUCOSE 111*  BUN 15  CREATININE 0.82  CALCIUM 9.6    PT/INR: No results found for this basename: LABPROT,INR in the last 72 hours ABG No results found for this basename: phart, pco2, po2, hco3, tco2, acidbasedef, o2sat   CBG (last 3)  No results found for this basename: GLUCAP:3 in the last 72 hours  Assessment/Plan: S/P   Admit Chest CT ,, cont suction to chest catheter Will prob need VATS this admission   LOS: 0 days    VAN TRIGT III,PETER 03/07/2012

## 2012-03-07 NOTE — ED Notes (Signed)
Pt verified, procedure verified, Pt consent signed after discussing procedure with physician.

## 2012-03-07 NOTE — ED Notes (Signed)
PAGED Dr. Donata Clay to [phone] per Dr. Bebe Shaggy

## 2012-03-07 NOTE — ED Notes (Signed)
Dorathy Daft, RN verified Chest tube was secure at pt site, Chest tube system placed on suction at 20 mmHg with no float appearing in the Pleur-Evac system, suction increased to with float present in system indicating the system is working, pt has very small amt of blood in drainage system, EDP notified re: pt status & Chest tube settings

## 2012-03-07 NOTE — Consult Note (Signed)
Chest x-ray after placement of right chest tube showed no resolution of the right pneumothorax. In talking with Dr. [name], the patient states that she has had right-sided chest pain for 6 weeks. This apparently had occurred suddenly. There is a persistent air leak in the right pleura vac. Her oxygen saturations remained at 97-98%. I suspect that the lung may be scarred down and may need more extensive evaluation treatment by thoracic surgery. Dr. [name] and family have been made aware. The patient will be transferred to: For further management and treatment.

## 2012-03-07 NOTE — ED Notes (Signed)
Called Dr. [name] @ [phone]. [facility]

## 2012-03-07 NOTE — ED Notes (Signed)
[facility] called to say they will try to have atruck here between 7 and 7:15.  Nurse, Arline Asp informed.

## 2012-03-07 NOTE — ED Notes (Signed)
Pt tolerated procedure well. Chest tube in place. Sahara drain attached to suction, flow rate set at med per Dr York Ram order.

## 2012-03-07 NOTE — ED Notes (Signed)
Pt seen by PCP today. CXR shows R pneumothorax.

## 2012-03-07 NOTE — ED Notes (Signed)
[facility] called for transport to [facility] ER. Dr. Alla German accepting.  Nurse aware.

## 2012-03-07 NOTE — ED Provider Notes (Signed)
History   This chart was scribed for [name], MD by [name], ED Scribe. This patient was seen in room APA10/APA10 and the patient's care was started at 1452.   CSN: [id number]  Arrival date & time 03/07/12  1440   First MD Initiated Contact with Patient 03/07/12 1452      Chief Complaint  Patient presents with  . Chest Injury    HPI Comments:     Patient is a 71 y.o. female presenting with shortness of breath. The history is provided by the patient and a relative. No language interpreter was used.  Shortness of Breath  The current episode started more than 2 weeks ago. The onset was gradual. The problem occurs continuously. The problem has been gradually worsening. The problem is severe. Nothing relieves the symptoms. Nothing aggravates the symptoms. Associated symptoms include shortness of breath. Pertinent negatives include no fever and no wheezing. There was no intake of a foreign body. She has been behaving normally. There were no sick contacts. Recently, medical care has been given by the PCP.    HPI Comments: [name] is a 71 y.o. female who presents to the Emergency Department complaining of SOB starting more than 2 months ago. Pt was last seen earlier today by Dr. [name] prior to coming in to the ED. Pt has h/o of COPD.   PCP Dr. [name]  No past medical history on file.  No past surgical history on file.  No family history on file.  History  Substance Use Topics  . Smoking status: Not on file  . Smokeless tobacco: Not on file  . Alcohol Use: Not on file    OB History    No data available      Review of Systems  Constitutional: Negative for fever.  Respiratory: Positive for shortness of breath. Negative for wheezing.     Allergies  Codeine and Penicillins  Home Medications   Current Outpatient Rx  Name  Route  Sig  Dispense  Refill  . ACLIDINIUM BROMIDE 400 MCG/ACT IN AEPB   Inhalation   Inhale into the lungs 2 (two) times daily.          . ALBUTEROL SULFATE (2.5 MG/3ML) 0.083% IN NEBU   Nebulization   Take 2.5 mg by nebulization 4 (four) times daily.         Marland Kitchen ALPRAZOLAM 1 MG PO TABS   Oral   Take 1 mg by mouth 3 (three) times daily as needed.          . ASPIRIN 81 MG PO CHEW   Oral   Chew 81 mg by mouth every morning.         Marland Kitchen CYANOCOBALAMIN 1000 MCG/ML IJ SOLN   Intramuscular   Inject 1,000 mcg into the muscle every 30 (thirty) days.         Marland Kitchen FLUTICASONE-SALMETEROL 250-50 MCG/DOSE IN AEPB   Inhalation   Inhale 1 puff into the lungs every 12 (twelve) hours.         . GUAIFENESIN ER 600 MG PO TB12   Oral   Take 600 mg by mouth every morning.          . IBUPROFEN 200 MG PO TABS   Oral   Take 600 mg by mouth every 6 (six) hours as needed. For pain         . LEVOTHYROXINE SODIUM 88 MCG PO TABS   Oral   Take 88 mcg by mouth daily  before breakfast.          . LORATADINE 10 MG PO TABS   Oral   Take 10 mg by mouth every morning.         . NON FORMULARY      OXYGEN      24/7         . POTASSIUM CHLORIDE CRYS ER 20 MEQ PO TBCR   Oral   Take 20 mEq by mouth daily.         . THEOPHYLLINE ER 200 MG PO TB12   Oral   Take 200 mg by mouth every morning.            There were no vitals taken for this visit.  Physical Exam  Nursing note and vitals reviewed. Constitutional: She is oriented to person, place, and time. She appears well-developed.  HENT:  Head: Normocephalic and atraumatic.  Eyes: Conjunctivae normal and EOM are normal. No scleral icterus.  Neck: Neck supple. No thyromegaly present.  Cardiovascular: Normal rate and normal heart sounds.  Exam reveals no gallop and no friction rub.   No murmur heard.      Slightly tachycardic.   Pulmonary/Chest: No stridor. She has no wheezes. She has no rales. She exhibits no tenderness.       Decreased breath sounds in right lung.   Abdominal: She exhibits no distension. There is no tenderness. There is no rebound.   Musculoskeletal: Normal range of motion. She exhibits no edema.  Lymphadenopathy:    She has no cervical adenopathy.  Neurological: She is oriented to person, place, and time. Coordination normal.  Skin: No rash noted. No erythema.  Psychiatric: She has a normal mood and affect. Her behavior is normal.    ED Course  Procedures (including critical care time)  DIAGNOSTIC STUDIES: Oxygen Saturation is 98% on room air, normal by my interpretation.    COORDINATION OF CARE:   2:55 PM Discussed treatment plan which includes blood work with pt at bedside and pt agreed to plan.   Labs Reviewed - No data to display Dg Chest 2 View  03/07/2012  *RADIOLOGY REPORT*  Clinical Data: Increased shortness of breath since October 2013 with chest pain.  CHEST - 2 VIEW  Comparison: 02/21/2009  Findings: Exam demonstrates a moderate to large right-sided pneumothorax with small amount of right pleural fluid.  There is mild right lung collapse/atelectasis.  Cardiomediastinal silhouette is within normal.  There is spondylosis of the spine.  IMPRESSION: Moderate to large right-sided pneumothorax with small right-sided effusion.  Findings discussed with Dr. [name] at the time of dictation.   Original Report Authenticated By: [name], M.D.      No diagnosis found.  Dr. [name] put an 8 french chest tube without resolution of pneumonthorax  MDM    Will  Transfer to [facility] at [facility]    The chart was scribed for me under my direct supervision.  I personally performed the history, physical, and medical decision making and all procedures in the evaluation of this patient.[name], MD 03/07/12 [phone]

## 2012-03-07 NOTE — ED Provider Notes (Addendum)
Pt here from [place] to see CT surgery for persistent spontaneous PTX despite tube thoracostomy Pt stable at this time No hypoxia is noted CT surgery has been paged BP 168/78  Pulse 100  Temp 98.3 F (36.8 C) (Oral)  Resp 24  Ht 5' 3.5" (1.613 m)  Wt 125 lb (56.7 kg)  BMI 21.80 kg/m2  SpO2 95%    Date: 03/07/2012  Rate: 98  Rhythm: normal sinus rhythm  QRS Axis: normal  Intervals: normal  ST/T Wave abnormalities: nonspecific ST changes  Conduction Disutrbances:none     Joya Gaskins, MD 03/07/12 2020  Joya Gaskins, MD 03/07/12 2037

## 2012-03-07 NOTE — ED Notes (Addendum)
Pt in from [facility] ED, pt expected by Donata Clay, pt transferred by [facility], pt A&O x 4, follows commands, speaks in complete sentences, BP 161/84, Pulse 95 NSR, O2 Sats 95% on 2L 

## 2012-03-07 NOTE — ED Notes (Signed)
Pt transferred by EMS. Stable at time of transfer. Report given to EMS, report already given to [facility] ED.

## 2012-03-08 ENCOUNTER — Inpatient Hospital Stay (HOSPITAL_COMMUNITY): Payer: Medicare HMO | Admitting: *Deleted

## 2012-03-08 ENCOUNTER — Inpatient Hospital Stay (HOSPITAL_COMMUNITY): Payer: Medicare HMO

## 2012-03-08 ENCOUNTER — Encounter (HOSPITAL_COMMUNITY): Payer: Self-pay | Admitting: *Deleted

## 2012-03-08 ENCOUNTER — Encounter (HOSPITAL_COMMUNITY)
Admission: EM | Disposition: A | Discharge status: Home / Self Care | Payer: Self-pay | Source: Home / Self Care | Attending: Cardiothoracic Surgery

## 2012-03-08 DIAGNOSIS — J930 Spontaneous tension pneumothorax: Secondary | ICD-10-CM

## 2012-03-08 HISTORY — PX: CHEST TUBE INSERTION: SHX231

## 2012-03-08 LAB — BLOOD GAS, ARTERIAL
Acid-Base Excess: 3.2 mmol/L — ABNORMAL HIGH (ref 0.0–2.0)
Bicarbonate: 28.5 mEq/L — ABNORMAL HIGH (ref 20.0–24.0)
O2 Content: 6 L/min
O2 Saturation: 99.6 %
Patient temperature: 98.6
TCO2: 30.2 mmol/L (ref 0–100)
pCO2 arterial: 54 mmHg — ABNORMAL HIGH (ref 35.0–45.0)
pH, Arterial: 7.342 — ABNORMAL LOW (ref 7.350–7.450)
pO2, Arterial: 141 mmHg — ABNORMAL HIGH (ref 80.0–100.0)

## 2012-03-08 LAB — COMPREHENSIVE METABOLIC PANEL
ALT: 10 U/L (ref 0–35)
ALT: 10 U/L (ref 0–35)
AST: 14 U/L (ref 0–37)
AST: 16 U/L (ref 0–37)
Albumin: 3.3 g/dL — ABNORMAL LOW (ref 3.5–5.2)
Albumin: 3.3 g/dL — ABNORMAL LOW (ref 3.5–5.2)
Alkaline Phosphatase: 108 U/L (ref 39–117)
Alkaline Phosphatase: 109 U/L (ref 39–117)
BUN: 18 mg/dL (ref 6–23)
BUN: 18 mg/dL (ref 6–23)
CO2: 30 mEq/L (ref 19–32)
CO2: 29 mEq/L (ref 19–32)
Calcium: 8.9 mg/dL (ref 8.4–10.5)
Calcium: 8.7 mg/dL (ref 8.4–10.5)
Chloride: 95 mEq/L — ABNORMAL LOW (ref 96–112)
Chloride: 97 mEq/L (ref 96–112)
Creatinine, Ser: 0.83 mg/dL (ref 0.50–1.10)
Creatinine, Ser: 0.77 mg/dL (ref 0.50–1.10)
GFR calc Af Amer: 81 mL/min — ABNORMAL LOW (ref >90)
GFR calc Af Amer: >90 mL/min (ref >90)
GFR calc non Af Amer: 70 mL/min — ABNORMAL LOW (ref >90)
GFR calc non Af Amer: 83 mL/min — ABNORMAL LOW (ref >90)
Glucose, Bld: 135 mg/dL — ABNORMAL HIGH (ref 70–99)
Glucose, Bld: 135 mg/dL — ABNORMAL HIGH (ref 70–99)
Potassium: 3.9 mEq/L (ref 3.5–5.1)
Potassium: 3.6 mEq/L (ref 3.5–5.1)
Sodium: 134 mEq/L — ABNORMAL LOW (ref 135–145)
Sodium: 136 mEq/L (ref 135–145)
Total Bilirubin: 0.7 mg/dL (ref 0.3–1.2)
Total Bilirubin: 0.7 mg/dL (ref 0.3–1.2)
Total Protein: 6.1 g/dL (ref 6.0–8.3)
Total Protein: 6.1 g/dL (ref 6.0–8.3)

## 2012-03-08 LAB — CBC
HCT: 32.5 % — ABNORMAL LOW (ref 36.0–46.0)
HCT: 31.7 % — ABNORMAL LOW (ref 36.0–46.0)
Hemoglobin: 10.5 g/dL — ABNORMAL LOW (ref 12.0–15.0)
Hemoglobin: 10.8 g/dL — ABNORMAL LOW (ref 12.0–15.0)
MCH: 27.5 pg (ref 26.0–34.0)
MCH: 28.9 pg (ref 26.0–34.0)
MCHC: 32.3 g/dL (ref 30.0–36.0)
MCHC: 34.1 g/dL (ref 30.0–36.0)
MCV: 85.1 fL (ref 78.0–100.0)
MCV: 84.8 fL (ref 78.0–100.0)
Platelets: 182 10*3/uL (ref 150–400)
Platelets: 181 10*3/uL (ref 150–400)
RBC: 3.82 MIL/uL — ABNORMAL LOW (ref 3.87–5.11)
RBC: 3.74 MIL/uL — ABNORMAL LOW (ref 3.87–5.11)
RDW: 13.4 % (ref 11.5–15.5)
RDW: 13.4 % (ref 11.5–15.5)
WBC: 5.9 10*3/uL (ref 4.0–10.5)
WBC: 5.4 10*3/uL (ref 4.0–10.5)

## 2012-03-08 LAB — PROTIME-INR
INR: 1.03 (ref 0.00–1.49)
Prothrombin Time: 13.4 seconds (ref 11.6–15.2)

## 2012-03-08 LAB — APTT: aPTT: 45 seconds — ABNORMAL HIGH (ref 24–37)

## 2012-03-08 LAB — MRSA PCR SCREENING: MRSA by PCR: NEGATIVE

## 2012-03-08 SURGERY — CHEST TUBE INSERTION
Anesthesia: Monitor Anesthesia Care | Site: Chest | Laterality: Right | Wound class: Clean

## 2012-03-08 MED ORDER — CEFUROXIME SODIUM 1.5 G IJ SOLR
INTRAMUSCULAR | Status: AC
  Filled 2012-03-08: qty 1.5

## 2012-03-08 MED ORDER — LEVALBUTEROL HCL 0.63 MG/3ML IN NEBU: 0.6300 mg | INHALATION_SOLUTION | Freq: Four times a day (QID) | RESPIRATORY_TRACT | Status: DC

## 2012-03-08 MED ORDER — LIDOCAINE HCL (PF) 1 % IJ SOLN
INTRAMUSCULAR | Status: AC
  Filled 2012-03-08: qty 30

## 2012-03-08 MED ORDER — DEXTROSE 5 % IV SOLN
1.5000 g | Freq: Two times a day (BID) | INTRAVENOUS | Status: AC
  Administered 2012-03-08 – 2012-03-09 (×2): 1.5 g via INTRAVENOUS
  Filled 2012-03-08 (×2): qty 1.5

## 2012-03-08 MED ORDER — LACTATED RINGERS IV SOLN
INTRAVENOUS | Status: DC | PRN
  Administered 2012-03-08: 12:00:00 via INTRAVENOUS

## 2012-03-08 MED ORDER — DEXTROSE 5 % IV SOLN
1.5000 g | INTRAVENOUS | Status: AC
  Administered 2012-03-08: 1.5 g via INTRAVENOUS
  Filled 2012-03-08: qty 1.5

## 2012-03-08 MED ORDER — ACETAMINOPHEN 10 MG/ML IV SOLN
1000.0000 mg | Freq: Four times a day (QID) | INTRAVENOUS | Status: AC
  Administered 2012-03-08 – 2012-03-09 (×3): 1000 mg via INTRAVENOUS
  Filled 2012-03-08 (×4): qty 100

## 2012-03-08 MED ORDER — LABETALOL HCL 5 MG/ML IV SOLN: 10.0000 mg | INTRAVENOUS | Status: DC | PRN

## 2012-03-08 MED ORDER — ACETAMINOPHEN 10 MG/ML IV SOLN: 1000.0000 mg | Freq: Once | INTRAVENOUS | Status: DC | PRN

## 2012-03-08 MED ORDER — POTASSIUM CHLORIDE 10 MEQ/100ML IV SOLN
10.0000 meq | Freq: Every day | INTRAVENOUS | Status: DC | PRN
  Administered 2012-03-10 (×2): 10 meq via INTRAVENOUS
  Filled 2012-03-08: qty 300

## 2012-03-08 MED ORDER — ONDANSETRON HCL 4 MG/2ML IJ SOLN: 4.0000 mg | Freq: Once | INTRAMUSCULAR | Status: DC | PRN

## 2012-03-08 MED ORDER — DEXTROSE 5 % IV SOLN
INTRAVENOUS | Status: AC
  Filled 2012-03-08: qty 50

## 2012-03-08 MED ORDER — TRAMADOL HCL 50 MG PO TABS
50.0000 mg | ORAL_TABLET | Freq: Four times a day (QID) | ORAL | Status: DC | PRN
  Administered 2012-03-13 (×2): 100 mg via ORAL
  Filled 2012-03-08: qty 2
  Filled 2012-03-08: qty 1
  Filled 2012-03-08: qty 2

## 2012-03-08 MED ORDER — LIDOCAINE HCL (CARDIAC) 20 MG/ML IV SOLN
INTRAVENOUS | Status: DC | PRN
  Administered 2012-03-08: 20 mg via INTRAVENOUS
  Administered 2012-03-08: 80 mg via INTRAVENOUS

## 2012-03-08 MED ORDER — PROPOFOL INFUSION 10 MG/ML OPTIME
INTRAVENOUS | Status: DC | PRN
  Administered 2012-03-08: 50 ug/kg/min via INTRAVENOUS

## 2012-03-08 MED ORDER — HYDROMORPHONE HCL PF 1 MG/ML IJ SOLN
0.2500 mg | INTRAMUSCULAR | Status: DC | PRN
  Administered 2012-03-08 (×2): 0.25 mg via INTRAVENOUS

## 2012-03-08 MED ORDER — THIAMINE HCL 100 MG/ML IJ SOLN
INTRAVENOUS | Status: DC
  Administered 2012-03-08 – 2012-03-09 (×2): via INTRAVENOUS
  Filled 2012-03-08 (×4): qty 1000

## 2012-03-08 MED ORDER — BISACODYL 5 MG PO TBEC
10.0000 mg | DELAYED_RELEASE_TABLET | Freq: Every day | ORAL | Status: DC
  Administered 2012-03-09 – 2012-03-16 (×7): 10 mg via ORAL
  Filled 2012-03-08 (×8): qty 2

## 2012-03-08 MED ORDER — ONDANSETRON HCL 4 MG/2ML IJ SOLN
4.0000 mg | Freq: Four times a day (QID) | INTRAMUSCULAR | Status: DC | PRN
  Administered 2012-03-09: 4 mg via INTRAVENOUS
  Filled 2012-03-08: qty 2

## 2012-03-08 MED ORDER — SODIUM CHLORIDE 0.9 % IV SOLN
INTRAVENOUS | Status: DC
  Administered 2012-03-08: 50 mL/h via INTRAVENOUS

## 2012-03-08 MED ORDER — HYDROMORPHONE HCL PF 1 MG/ML IJ SOLN
INTRAMUSCULAR | Status: AC
  Administered 2012-03-08: 0.25 mg via INTRAVENOUS
  Filled 2012-03-08: qty 1

## 2012-03-08 MED ORDER — LIDOCAINE HCL (PF) 1 % IJ SOLN
INTRAMUSCULAR | Status: DC | PRN
  Administered 2012-03-08: 9 mL

## 2012-03-08 MED ORDER — LACTATED RINGERS IV SOLN
INTRAVENOUS | Status: DC
  Administered 2012-03-08: 11:00:00 via INTRAVENOUS

## 2012-03-08 SURGICAL SUPPLY — 14 items
CATH THORACIC 28FR (CATHETERS) ×2 IMPLANT
CATH THORACIC 36FR (CATHETERS) IMPLANT
CLOTH BEACON ORANGE TIMEOUT ST (SAFETY) ×3 IMPLANT
GLOVE BIO SURGEON STRL SZ7.5 (GLOVE) ×6 IMPLANT
GOWN PREVENTION PLUS XLARGE (GOWN DISPOSABLE) ×3 IMPLANT
GOWN STRL NON-REIN LRG LVL3 (GOWN DISPOSABLE) ×3 IMPLANT
KIT ROOM TURNOVER OR (KITS) ×3 IMPLANT
PAD ARMBOARD 7.5X6 YLW CONV (MISCELLANEOUS) ×6 IMPLANT
SPONGE GAUZE 4X4 12PLY (GAUZE/BANDAGES/DRESSINGS) ×3 IMPLANT
SUT SILK  1 MH (SUTURE) ×4
SUT SILK 1 MH (SUTURE) ×2 IMPLANT
SYSTEM SAHARA CHEST DRAIN RE-I (WOUND CARE) ×3 IMPLANT
TAPE CLOTH SURG 4X10 WHT LF (GAUZE/BANDAGES/DRESSINGS) ×2 IMPLANT
TOWEL OR 17X24 6PK STRL BLUE (TOWEL DISPOSABLE) ×3 IMPLANT

## 2012-03-08 NOTE — Progress Notes (Signed)
The patient was examined and preop studies reviewed. There has been no change from the prior exam and the patient is ready for surgery.  Plan R chest tube placement today

## 2012-03-08 NOTE — Anesthesia Preprocedure Evaluation (Signed)
Anesthesia Evaluation  Patient identified by MRN, date of birth, ID band Patient awake    Reviewed: Allergy & Precautions, H&P , NPO status , Patient's Chart, lab work & pertinent test results  Airway Mallampati: II      Dental  (+) Edentulous Upper and Partial Lower   Pulmonary    + decreased breath sounds      Cardiovascular Rhythm:Regular Rate:Tachycardia     Neuro/Psych    GI/Hepatic   Endo/Other    Renal/GU      Musculoskeletal   Abdominal   Peds  Hematology   Anesthesia Other Findings   Reproductive/Obstetrics                           Anesthesia Physical Anesthesia Plan  ASA: III  Anesthesia Plan: MAC   Post-op Pain Management:    Induction: Intravenous  Airway Management Planned: Natural Airway and Simple Face Mask  Additional Equipment:   Intra-op Plan:   Post-operative Plan:   Informed Consent: I have reviewed the patients History and Physical, chart, labs and discussed the procedure including the risks, benefits and alternatives for the proposed anesthesia with the patient or authorized representative who has indicated his/her understanding and acceptance.   Dental advisory given  Plan Discussed with: CRNA and Surgeon  Anesthesia Plan Comments: (71 Y/O female with COPD on home O2 with R. Spontaneous pneumothorax. Plan MAC  Kipp Brood, MD)        Anesthesia Quick Evaluation

## 2012-03-08 NOTE — Progress Notes (Addendum)
                   [address]            [name] [address]          [phone]         Subjective: Patient states breathing is no worse than yesterday  Objective: Vital signs in last 24 hours: Temp:  [97.9 F (36.6 C)-98.3 F (36.8 C)] 98.3 F (36.8 C) (12/04 0737) Pulse Rate:  [85-110] 90  (12/04 0737) Cardiac Rhythm:  [-]  Resp:  [18-30] 18  (12/04 0737) BP: (136-193)/(68-97) 136/71 mmHg (12/04 0400) SpO2:  [91 %-100 %] 99 % (12/04 0737) Weight:  [125 lb (56.7 kg)-127 lb 3.3 oz (57.7 kg)] 127 lb 3.3 oz (57.7 kg) (12/03 2237)       Physical Exam:  Cardiovascular: RRR Pulmonary: Clear to auscultation on left; diminished breath sounds on right; no rales, wheezes, or rhonchi. Appears to be breathing heavy Abdomen: Soft, non tender, bowel sounds present. Extremities: No lower extremity edema.   Lab Results: CBC: Basename 03/08/12 0500 03/07/12 1508  WBC 5.9 6.3  HGB 10.5* 12.0  HCT 32.5* 35.8*  PLT 182 187   BMET:  Basename 03/08/12 0500 03/07/12 1508  NA 134* 138  K 3.9 4.3  CL 95* 96  CO2 30 32  GLUCOSE 135* 111*  BUN 18 15  CREATININE 0.83 0.82  CALCIUM 8.9 9.6    PT/INR:  Basename 03/08/12 0500  LABPROT 13.4  INR 1.03   ABG:  INR: Will add last result for INR, ABG once components are confirmed Will add last 4 CBG results once components are confirmed  Assessment/Plan:  1. CV - SR 2.  Pulmonary - No air leak and scant output from right chest tube.On 4L of O2 via [place].Chest x ray appears unchanged this am (persistent large right ptx).CT done yesterday showed large right pneumothorax, small right chest tube extends directly into anterior portion of RUL, 1.9 cm pleural based nodule on the posterior RML,  questionable 1.3 cm RUL, and diffuse bilateral emphysematous changes.Likely will require right VATS. 3.H and H decreased to 10.5 and 32.5 4.Keep NPO until seen by Dr. Donata Clay  ZIMMERMAN,DONIELLE MPA-C 03/08/2012,7:43 AM     patient examined and medical record reviewed,agree with above note.  Will place new chest tube in OR, remove prior catheter and see if lung will re-expand w/o VATS which would be very high risk VAN TRIGT III,Stormee Duda 03/08/2012

## 2012-03-08 NOTE — H&P (Signed)
[address]            [place] [address]          [phone]       [facility] Medical Record #[id number] Date of Birth: Oct 17, 1940  Referring: No ref. provider found Primary Care: Cassell Smiles., MD  Chief Complaint:    Chief Complaint  Patient presents with  . Chest Injury    History of Present Illness:     71 yo exsmoker with end stage COPD home O2 with 3 wks SOB Found to have new R side spont pneumothorax First episode PNTX Chest tube- catheter placed in ED @ [facility] without rexpansion CT chest after transfer shows catheter to be in RUL Possible 1.5 cm densities in RUL.RML w/o mediastinal adenopathy Large remaining PNTX on CT  Current Activity/ Functional Status: Very limited due to COPD   Past Medical History  Diagnosis Date  . COPD (chronic obstructive pulmonary disease)   . Thyroid disease   . Pernicious anemia   . Hypertension     Past Surgical History  Procedure Date  . Abdominal hysterectomy   . Appendectomy   . Cholecystectomy   . Tubal ligation     History  Smoking status  . Former Smoker  . Types: Cigarettes  Smokeless tobacco  . Former Neurosurgeon  . Quit date: 03/07/2006    History  Alcohol Use No  none  History   Social History  . Marital Status: Married    Spouse Name: N/A    Number of Children: N/A  . Years of Education: N/A   Occupational History  . Not on file.   Social History Main Topics  . Smoking status: Former Smoker    Types: Cigarettes  . Smokeless tobacco: Former Neurosurgeon    Quit date: 03/07/2006  . Alcohol Use: No  . Drug Use: No  . Sexually Active:    Other Topics Concern  . Not on file   Social History Narrative  . No narrative on file    Allergies  Allergen Reactions  . Codeine Nausea And Vomiting    Projectile vomiting  . Penicillins Rash    She can take Keflex without a problem  Current Facility-Administered Medications  Medication Dose Route  Frequency Provider Last Rate Last Dose  . acetaminophen (TYLENOL) tablet 650 mg  650 mg Oral Q6H PRN Kerin Perna, MD       Or  . acetaminophen (TYLENOL) suppository 650 mg  650 mg Rectal Q6H PRN Kerin Perna, MD      . ALPRAZolam Prudy Feeler) tablet 0.5 mg  0.5 mg Oral TID Kerin Perna, MD      . aspirin EC tablet 81 mg  81 mg Oral Daily Kerin Perna, MD      . cefUROXime (ZINACEF) 1.5 g in dextrose 5 % 50 mL IVPB  1.5 g Intravenous 60 min Pre-Op Kerin Perna, MD      . [COMPLETED] dextrose 5 % and 0.9% NaCl 1,000 mL with thiamine 100 mg, folic acid 1 mg, multivitamins adult 10 mL infusion   Intravenous Once Kerin Perna, MD 30 mL/hr at 03/08/12 [phone]    . docusate sodium (COLACE) capsule 100 mg  100 mg Oral BID Kerin Perna, MD      . enoxaparin (LOVENOX) injection 40 mg  40 mg Subcutaneous  Daily Kerin Perna, MD      . guaiFENesin [facility]) 12 hr tablet 600 mg  600 mg Oral BID Kerin Perna, MD   600 mg at 03/08/12 0019  . HYDROcodone-acetaminophen (NORCO/VICODIN) 5-325 MG per tablet 1-2 tablet  1-2 tablet Oral Q4H PRN Kerin Perna, MD   2 tablet at 03/07/12 2216  . [COMPLETED] iohexol (OMNIPAQUE) 300 MG/ML solution 75 mL  75 mL Intravenous Once PRN Medication Radiologist, MD   75 mL at 03/07/12 2144  . levalbuterol (XOPENEX) nebulizer solution 0.63 mg  0.63 mg Nebulization Q6H Kerin Perna, MD   0.63 mg at 03/08/12 0734  . levalbuterol (XOPENEX) nebulizer solution 0.63 mg  0.63 mg Nebulization Q6H PRN Kerin Perna, MD   0.63 mg at 03/08/12 0029  . levothyroxine (SYNTHROID, LEVOTHROID) tablet 88 mcg  88 mcg Oral QAC breakfast Kerin Perna, MD      . mometasone-formoterol [facility]) 100-5 MCG/ACT inhaler 2 puff  2 puff Inhalation BID Kerin Perna, MD   2 puff at 03/08/12 0734  . multivitamin with minerals tablet 1 tablet  1 tablet Oral Daily Kerin Perna, MD      . promethazine [facility]) tablet 12.5 mg  12.5 mg Oral Q6H PRN Kerin Perna, MD      . senna  [facility]) tablet 8.6 mg  1 tablet Oral BID Kerin Perna, MD      . sodium chloride 0.9 % injection 3 mL  3 mL Intravenous Q12H Kerin Perna, MD   3 mL at 03/08/12 0020  . tiotropium (SPIRIVA) inhalation capsule 18 mcg  18 mcg Inhalation Daily Kerin Perna, MD   18 mcg at 03/08/12 8295    Prescriptions prior to admission  Medication Sig Dispense Refill  . Aclidinium Bromide (TUDORZA PRESSAIR) 400 MCG/ACT AEPB Inhale into the lungs 2 (two) times daily.      Marland Kitchen albuterol (PROVENTIL) (2.5 MG/3ML) 0.083% nebulizer solution Take 2.5 mg by nebulization 4 (four) times daily. *May use up to every 4 hours as needed for shortness of breath*      . ALPRAZolam (XANAX) 1 MG tablet Take 1 mg by mouth 3 (three) times daily as needed. For anxiety and/or sleep      . aspirin 81 MG chewable tablet Chew 81 mg by mouth every morning.      . cyanocobalamin (,VITAMIN B-12,) 1000 MCG/ML injection Inject 1,000 mcg into the muscle every 30 (thirty) days.      . Fluticasone-Salmeterol (ADVAIR) 250-50 MCG/DOSE AEPB Inhale 1 puff into the lungs every 12 (twelve) hours.      Marland Kitchen guaiFENesin (MUCINEX) 600 MG 12 hr tablet Take 600 mg by mouth every morning.       Marland Kitchen ibuprofen (ADVIL,MOTRIN) 200 MG tablet Take 600 mg by mouth every 6 (six) hours as needed. For pain      . levothyroxine (SYNTHROID, LEVOTHROID) 88 MCG tablet Take 88 mcg by mouth daily before breakfast. BRAND MEDICALLY NECESSARY      . loratadine (CLARITIN) 10 MG tablet Take 10 mg by mouth every morning.      . NON FORMULARY OXYGEN      24/7      . potassium chloride SA (K-DUR,KLOR-CON) 20 MEQ tablet Take 20 mEq by mouth daily.      . theophylline (THEODUR) 200 MG 12 hr tablet Take 200 mg by mouth every morning.         Family History  Problem Relation Age  of Onset  . Cancer Mother   . Heart failure Brother      Review of Systems:     Cardiac Review of Systems: Y or N  Chest Pain [  n  ]  Resting SOB Cove.Etienne   ] Exertional SOB  [ y ]  Pollyann Kennedy Cove.Etienne  ]     Pedal Edema [  n ]    Palpitations Milo.Brash  ] Syncope  [  ]   Presyncope [   ]  General Review of Systems: [Y] = yes [  ]=no Constitional: recent weight change [  ]; anorexia [  ]; fatigue [  ]; nausea [  ]; night sweats [  ]; fever [  ]; or chills [  ];                                                                                                                                          Dental: poor dentition[  ]; Last Dentist visit:< 1 yr  Eye : blurred vision [  ]; diplopia [   ]; vision changes [  ];  Amaurosis fugax[  ]; Resp: cough [  ];  wheezing[ y ];  hemoptysis[  ]; shortness of breath[  ]; paroxysmal nocturnal dyspnea[  ]; dyspnea on exertion[y  ]; or orthopnea[  ];  GI:  gallstones[  ], vomiting[  ];  dysphagia[  ]; melena[  ];  hematochezia [  ]; heartburn[  ];   Hx of  Colonoscopy[  ]; GU: kidney stones [  ]; hematuria[  ];   dysuria [  ];  nocturia[  ];  history of     obstruction [  ];             Skin: rash, swelling[  ];, hair loss[  ];  peripheral edema[  ];  or itching[  ]; Musculosketetal: myalgias[  ];  joint swelling[  ];  joint erythema[  ];  joint pain[  ];  back pain[  ];  Heme/Lymph: bruising[  ];  bleeding[  ];  anemia[  ];  Neuro: TIA[  ];  headaches[  ];  stroke[  ];  vertigo[  ];  seizures[  ];   paresthesias[  ];  difficulty walking[  ];  Psych:depression[  ]; anxiety[  ];  Endocrine: diabetes[  ];  thyroid dysfunction[  ];  Immunizations: Flu [  ]; Pneumococcal[  ];  Other:  Physical Exam: BP 146/78  Pulse 90  Temp 98.3 F (36.8 C) (Oral)  Resp 18  Ht 5' 3.5" (1.613 m)  Wt 127 lb 3.3 oz (57.7 kg)  BMI 22.18 kg/m2  SpO2 99%  Gen chronically ill COPD HEENT normocephalic, pupils equal Neck no JVD, mass,bruit Lungs cath in R chest w/o leak, decreased breath sound bilateraly COR- sinus tach w/o murmur Abd- soft, splenomegaly noted on CT scan Extrem no edema, tenderness  Neuro intact, oriented Vascular pulses intact, no calf tenderness   Diagnostic  Studies & Laboratory data:     Recent Radiology Findings:   Dg Chest 2 View  03/07/2012  *RADIOLOGY REPORT*  Clinical Data: Increased shortness of breath since October 2013 with chest pain.  CHEST - 2 VIEW  Comparison: 02/21/2009  Findings: Exam demonstrates a moderate to large right-sided pneumothorax with small amount of right pleural fluid.  There is mild right lung collapse/atelectasis.  Cardiomediastinal silhouette is within normal.  There is spondylosis of the spine.  IMPRESSION: Moderate to large right-sided pneumothorax with small right-sided effusion.  Findings discussed with Dr. [name] at the time of dictation.   Original Report Authenticated By: [name], M.D.    Ct Chest W Contrast  03/07/2012  *RADIOLOGY REPORT*  Clinical Data: Preoperative chest radiograph for VATS, for right- sided pneumothorax.  CT CHEST WITH CONTRAST  Technique:  Multidetector CT imaging of the chest was performed following the standard protocol during bolus administration of intravenous contrast.  Contrast: 75mL OMNIPAQUE IOHEXOL 300 MG/ML  SOLN  Comparison: Chest radiograph performed earlier today at 04:45 p.m.  Findings: The patient's small right-sided chest tube is noted extending directly into the anterior aspect of the right upper lung lobe; the distal 4-5 cm of the tube resides within the lung, though retracting it further may cause it to leave the pleural space entirely.  This likely explains the lack of re-expansion status post chest tube placement.  The collapsed appearance of the right lung is somewhat atypical, with an apparent 1.9 cm pleural based nodule noted likely along the posterior aspect of the right middle lobe.  There is also somewhat unusual nodularity at the right upper lobe, measuring approximately 1.3 cm, with suggestion of architectural distortion, though some of this may reflect atelectasis.  A large right-sided pneumothorax is seen.  There is diffuse bilateral emphysematous change, more  prominent at the upper lung zones.  The left lung is otherwise clear.  Diffuse coronary artery calcifications are noted.  Trace pericardial fluid remains within normal limits.  The great vessels are grossly unremarkable in appearance, aside from mild soft plaque noted along the proximal left common carotid and right innominate arteries.  No mediastinal lymphadenopathy is seen. The thyroid gland is very diminutive.  No axillary lymphadenopathy is appreciated.  The visualized portions of the liver are unremarkable; the spleen appears somewhat bulky.  Diffuse splenic artery calcification is noted.  Scattered calcific atherosclerotic disease is noted along the proximal abdominal aorta.  No acute osseous abnormalities are seen.  IMPRESSION:  1.  Large right-sided pneumothorax seen. 2.  Small right-sided chest tube noted extending directly into the anterior aspect of the right upper lung lobe; the distal 4-5 cm of the tube resides within the lung, though retracting much further may cause the chest tube to leave the pleural space entirely.  This likely explains the lack of re-expansion status post chest tube placement.  Retraction of the tube could be attempted, or a larger chest tube could be placed. 3.  Somewhat atypical appearance for the collapsed right lung, with an apparent 1.9 cm pleural based nodule likely along the posterior aspect of the right middle lobe, and a possible 1.3 cm nodule at the right upper lobe, with suggestion of architectural distortion. Some of this could reflect atelectasis. 4.  Diffuse bilateral emphysematous change, more prominent at the upper lung zones. 5.  Diffuse coronary artery calcifications seen. 6.  Mild soft plaque noted along the proximal left  common carotid and right innominate arteries.  Scattered calcific atherosclerotic disease along the proximal abdominal aorta. 7.  Suspect mild splenomegaly.  These results were called by telephone on 03/07/2012 at 10:02 p.m. to Dr. Zadie Rhine, who verbally acknowledged these results.   Original Report Authenticated By: Tonia Ghent, M.D.    Portable Chest 1 View  03/08/2012  *RADIOLOGY REPORT*  Clinical Data: Right-sided pneumothorax  PORTABLE CHEST - 1 VIEW  Comparison: March 07, 2012  Findings: The pneumothorax on the right is again noted.  It appears minimally smaller.  There does not appear to be a tension component. There is persistent right base atelectasis.  The previously noted chest tube appears outside of the pleural space on the right.  There is interstitial edema in the bases. There is no consolidation on the left.  The heart size is normal.  Pulmonary vascularity is stable - vascularity on the right in the area of pneumothorax.  No bony lesions.  IMPRESSION:  Pneumothorax on the right may be minimally smaller. There is edema in the left base. Chest tube no longer within the right pleural space.   Original Report Authenticated By: Bretta Bang, M.D.    Dg Chest Port 1 View  03/07/2012  *RADIOLOGY REPORT*  Clinical Data: Chest tube placement  PORTABLE CHEST - 1 VIEW  Comparison: 03/07/2012  Findings: Cardiomediastinal silhouette is stable.  Persistent large right pneumothorax slight increase from prior exam.  Atelectasis of the right lung is noted.  A right upper pleural catheter is noted. The left lung is clear.  IMPRESSION: Persistent large right pneumothorax slight increase from prior exam.  Atelectasis of the right lung is noted.  A right upper pleural catheter is noted.  Critical findings discussed with Dr. [name].   Original Report Authenticated By: Natasha Mead, M.D.       Recent Lab Findings: Lab Results  Component Value Date   WBC 5.9 03/08/2012   HGB 10.5* 03/08/2012   HCT 32.5* 03/08/2012   PLT 182 03/08/2012   GLUCOSE 135* 03/08/2012   ALT 10 03/08/2012   AST 14 03/08/2012   NA 134* 03/08/2012   K 3.9 03/08/2012   CL 95* 03/08/2012   CREATININE 0.83 03/08/2012   BUN 18 03/08/2012   CO2 30 03/08/2012   INR  1.03 03/08/2012      Assessment / Plan:     Admit for chest tube management of spontaneous R pneumothorax    will need new chest and possible VATS if new tube doesnot rexpand lung      @me1 @ 03/08/2012 9:09 AM

## 2012-03-08 NOTE — Op Note (Signed)
[name], [name] NO.:  [id number]  MEDICAL RECORD NO.:  [id number]  LOCATION:  3302                         FACILITY:  [facility]  PHYSICIAN:  Kerin Perna, M.D.  DATE OF BIRTH:  08/24/1940  DATE OF PROCEDURE:  03/08/2012 DATE OF DISCHARGE:                              OPERATIVE REPORT   OPERATION:  Placement of right chest tube, 28-French.  SURGEON:  Kerin Perna, M.D.  ANESTHESIA:  Local 1% lidocaine with MAC, intravenous sedation monitored by anesthesia.  PREOPERATIVE DIAGNOSIS:  Spontaneous right pneumothorax with inadequate re-expansion following chest catheter placement at outside hospital.  POSTOPERATIVE DIAGNOSIS:  Spontaneous right pneumothorax with inadequate re-expansion following chest catheter placement at outside hospital.  PROCEDURE:  The patient was brought to the operating room for placement of a right chest tube for spontaneous right pneumothorax, which was significant and did not respond to catheter placement at an outside hospital.  The patient was placed in the OR table.  The chest was prepped and draped as a sterile field.  A proper time-out was performed. Lidocaine 1% was infiltrated in the third interspace in the midclavicular line after the previously placed chest catheter has been removed.  More lidocaine was infiltrated in the interspace.  The right pleural space was entered with a hemostat and a rush of air exited the pleural space.  A 28-French chest tube was directed into the pleural space, secured to the skin with silk sutures and connected to underwater seal Pleur-evac system.  A sterile dressing was applied.  Chest x-ray in the room demonstrated re-expansion of the lung, resolution of the pneumothorax and the chest tube to be in good position.  The patient then returned to the recovery room.     Kerin Perna, M.D.     PV/MEDQ  D:  03/08/2012  T:  03/08/2012  Job:  [id number]  cc:   Ramon Dredge L. [name], M.D.

## 2012-03-08 NOTE — Progress Notes (Signed)
Utilization review completed.  

## 2012-03-08 NOTE — Progress Notes (Signed)
Patient back from [place], where a chest tube was placed to right upper chest at 20 cm of suction, no drainage and no air leak present at this time. Patient alert and oriented, 98.7,166/76, 83,98% on 3L O2.family at bedside.Will continue to monitor .

## 2012-03-08 NOTE — Anesthesia Postprocedure Evaluation (Signed)
Anesthesia Post Note  Patient: [name]  Procedure(s) Performed: Procedure(s) (LRB): CHEST TUBE INSERTION (Right)  Anesthesia type: MAC  Patient location: PACU  Post pain: Pain level controlled  Post assessment: Patient's Cardiovascular Status Stable  Last Vitals:  Filed Vitals:   03/08/12 1345  BP:   Pulse: 80  Temp:   Resp: 24    Post vital signs: Reviewed and stable  Level of consciousness: sedated  Complications: No apparent anesthesia complications

## 2012-03-08 NOTE — Preoperative (Signed)
Beta Blockers   Reason not to administer Beta Blockers:Not Applicable 

## 2012-03-08 NOTE — Transfer of Care (Signed)
Immediate Anesthesia Transfer of Care Note  Patient: [name]  Procedure(s) Performed: Procedure(s) (LRB) with comments: CHEST TUBE INSERTION (Right)  Patient Location: PACU  Anesthesia Type:MAC  Level of Consciousness: awake, alert  and oriented  Airway & Oxygen Therapy: Patient Spontanous Breathing and Patient connected to face mask oxygen  Post-op Assessment: Report given to PACU RN, Post -op Vital signs reviewed and stable and Patient moving all extremities  Post vital signs: Reviewed and stable  Complications: No apparent anesthesia complications

## 2012-03-08 NOTE — Brief Op Note (Signed)
03/07/2012 - 03/08/2012  1:01 PM  PATIENT:  Donah Driver Grigoryan  71 y.o. female  PRE-OPERATIVE DIAGNOSIS:  need for tube  POST-OPERATIVE DIAGNOSIS:  need for tube  PROCEDURE:  Procedure(s) (LRB) with comments: CHEST TUBE INSERTION (Right)  SURGEON:  Surgeon(s) and Role:    * Kerin Perna, MD - Primary  PHYSICIAN ASSISTANT:   ASSISTANTS: none   ANESTHESIA:   MAC  EBL:  Total I/O In: -  Out: 20 [Chest Tube:20]  BLOOD ADMINISTERED:none  DRAINS: 1 Chest Tube(s) in the  right pleural space , 28 F  LOCAL MEDICATIONS USED:  LIDOCAINE  and Amount: 8 ml  SPECIMEN:  No Specimen  DISPOSITION OF SPECIMEN:  n/a  COUNTS:  YES  TOURNIQUET: 0  DICTATION: .Dragon Dictation  PLAN OF CARE: Admit to inpatient   PATIENT DISPOSITION:  PACU - hemodynamically stable.   Delay start of Pharmacological VTE agent (>24hrs) due to surgical blood loss or risk of bleeding: yes

## 2012-03-09 ENCOUNTER — Encounter (HOSPITAL_COMMUNITY): Payer: Self-pay | Admitting: Cardiothoracic Surgery

## 2012-03-09 ENCOUNTER — Inpatient Hospital Stay (HOSPITAL_COMMUNITY): Payer: Medicare HMO

## 2012-03-09 LAB — BASIC METABOLIC PANEL
BUN: 16 mg/dL (ref 6–23)
CO2: 30 mEq/L (ref 19–32)
Calcium: 8.2 mg/dL — ABNORMAL LOW (ref 8.4–10.5)
Chloride: 98 mEq/L (ref 96–112)
Creatinine, Ser: 0.85 mg/dL (ref 0.50–1.10)
GFR calc Af Amer: 79 mL/min — ABNORMAL LOW (ref >90)
GFR calc non Af Amer: 68 mL/min — ABNORMAL LOW (ref >90)
Glucose, Bld: 107 mg/dL — ABNORMAL HIGH (ref 70–99)
Potassium: 3.7 mEq/L (ref 3.5–5.1)
Sodium: 136 mEq/L (ref 135–145)

## 2012-03-09 LAB — BLOOD GAS, ARTERIAL
Acid-Base Excess: 3.7 mmol/L — ABNORMAL HIGH (ref 0.0–2.0)
Bicarbonate: 29.1 mEq/L — ABNORMAL HIGH (ref 20.0–24.0)
O2 Content: 4 L/min
O2 Saturation: 99.3 %
Patient temperature: 98.6
TCO2: 30.9 mmol/L (ref 0–100)
pCO2 arterial: 56.1 mmHg — ABNORMAL HIGH (ref 35.0–45.0)
pH, Arterial: 7.335 — ABNORMAL LOW (ref 7.350–7.450)
pO2, Arterial: 114 mmHg — ABNORMAL HIGH (ref 80.0–100.0)

## 2012-03-09 LAB — CBC
HCT: 30.3 % — ABNORMAL LOW (ref 36.0–46.0)
Hemoglobin: 10.1 g/dL — ABNORMAL LOW (ref 12.0–15.0)
MCH: 28.1 pg (ref 26.0–34.0)
MCHC: 33.3 g/dL (ref 30.0–36.0)
MCV: 84.2 fL (ref 78.0–100.0)
Platelets: 170 10*3/uL (ref 150–400)
RBC: 3.6 MIL/uL — ABNORMAL LOW (ref 3.87–5.11)
RDW: 13.5 % (ref 11.5–15.5)
WBC: 6 10*3/uL (ref 4.0–10.5)

## 2012-03-09 MED ORDER — FUROSEMIDE 10 MG/ML IJ SOLN
40.0000 mg | Freq: Two times a day (BID) | INTRAMUSCULAR | Status: DC
  Administered 2012-03-09: 40 mg via INTRAVENOUS
  Filled 2012-03-09 (×3): qty 4

## 2012-03-09 MED ORDER — POTASSIUM CHLORIDE 10 MEQ/50ML IV SOLN
10.0000 meq | INTRAVENOUS | Status: AC
  Administered 2012-03-09 (×2): 10 meq via INTRAVENOUS
  Filled 2012-03-09: qty 100

## 2012-03-09 MED ORDER — POTASSIUM CHLORIDE 10 MEQ/100ML IV SOLN
INTRAVENOUS | Status: AC
  Administered 2012-03-09: 10 meq
  Filled 2012-03-09: qty 200

## 2012-03-09 MED ORDER — SODIUM CHLORIDE 0.9 % IV SOLN: INTRAVENOUS | Status: DC

## 2012-03-09 NOTE — Progress Notes (Addendum)
                   [address]            [name] [address]          [phone]    1 Day Post-Op Procedure(s) (LRB): CHEST TUBE INSERTION (Right)  Subjective: Patient with some nausea but no emesis or abdominal pain yesterday. Feeling better this am.  Objective: Vital signs in last 24 hours: Temp:  [98.1 F (36.7 C)-98.9 F (37.2 C)] 98.9 F (37.2 C) (12/05 0400) Pulse Rate:  [69-98] 69  (12/05 0400) Cardiac Rhythm:  [-] Normal sinus rhythm (12/04 2000) Resp:  [19-29] 20  (12/05 0400) BP: (122-180)/(64-86) 122/64 mmHg (12/05 0400) SpO2:  [97 %-100 %] 98 % (12/05 0400)    12/04 0701 - 12/05 0700 In: 1140 [P.O.:240; I.V.:700; IV Piggyback:200] Out: 20 [Chest Tube:20]  Physical Exam:  Cardiovascular: RRR Pulmonary: Mostly clear to auscultation bilaterally; no rales, wheezes, or rhonchi. Appears to be breathing heavy Abdomen: Soft, non tender, bowel sounds present. Extremities: No lower extremity edema. Chest tube: 20 cc of output last 24 hours, no air leak  Lab Results: CBC:  Basename 03/09/12 0439 03/08/12 0919  WBC 6.0 5.4  HGB 10.1* 10.8*  HCT 30.3* 31.7*  PLT 170 181   BMET:   Basename 03/09/12 0439 03/08/12 0919  NA 136 136  K 3.7 3.6  CL 98 97  CO2 30 29  GLUCOSE 107* 135*  BUN 16 18  CREATININE 0.85 0.77  CALCIUM 8.2* 8.7    PT/INR:   Basename 03/08/12 0500  LABPROT 13.4  INR 1.03   ABG:  INR: Will add last result for INR, ABG once components are confirmed Will add last 4 CBG results once components are confirmed  Assessment/Plan:  1. CV - SR 2.  Pulmonary - No air leak and scant output from right chest tube.On 4L of O2 via [place].Chest x ray appears unchanged this am (re expansion of right lung, small opacity right lung may be one of the nodules seen on CT). Hope to place chest tube to water seal soon. Check CXR in am. 3.H and H decreased to 10.1 and 30.3  ZIMMERMAN,DONIELLE MPA-C 03/09/2012,7:45 AM    Re-expansion  pulmonary edema- start running lasix and keep CT to suction  patient examined and medical record reviewed,agree with above note. VAN TRIGT III,Ifrah Vest 03/09/2012

## 2012-03-10 ENCOUNTER — Inpatient Hospital Stay (HOSPITAL_COMMUNITY): Payer: Medicare HMO

## 2012-03-10 LAB — COMPREHENSIVE METABOLIC PANEL
ALT: 11 U/L (ref 0–35)
AST: 19 U/L (ref 0–37)
Albumin: 2.8 g/dL — ABNORMAL LOW (ref 3.5–5.2)
Alkaline Phosphatase: 101 U/L (ref 39–117)
BUN: 15 mg/dL (ref 6–23)
CO2: 31 mEq/L (ref 19–32)
Calcium: 8.1 mg/dL — ABNORMAL LOW (ref 8.4–10.5)
Chloride: 95 mEq/L — ABNORMAL LOW (ref 96–112)
Creatinine, Ser: 0.77 mg/dL (ref 0.50–1.10)
GFR calc Af Amer: >90 mL/min (ref >90)
GFR calc non Af Amer: 83 mL/min — ABNORMAL LOW (ref >90)
Glucose, Bld: 119 mg/dL — ABNORMAL HIGH (ref 70–99)
Potassium: 3 mEq/L — ABNORMAL LOW (ref 3.5–5.1)
Sodium: 135 mEq/L (ref 135–145)
Total Bilirubin: 0.5 mg/dL (ref 0.3–1.2)
Total Protein: 5.3 g/dL — ABNORMAL LOW (ref 6.0–8.3)

## 2012-03-10 LAB — CBC
HCT: 28.7 % — ABNORMAL LOW (ref 36.0–46.0)
Hemoglobin: 9.6 g/dL — ABNORMAL LOW (ref 12.0–15.0)
MCH: 28.1 pg (ref 26.0–34.0)
MCHC: 33.4 g/dL (ref 30.0–36.0)
MCV: 83.9 fL (ref 78.0–100.0)
Platelets: 178 10*3/uL (ref 150–400)
RBC: 3.42 MIL/uL — ABNORMAL LOW (ref 3.87–5.11)
RDW: 13.6 % (ref 11.5–15.5)
WBC: 5.4 10*3/uL (ref 4.0–10.5)

## 2012-03-10 MED ORDER — ADULT MULTIVITAMIN W/MINERALS CH
1.0000 | ORAL_TABLET | Freq: Every day | ORAL | Status: DC
  Administered 2012-03-10 – 2012-03-24 (×12): 1 via ORAL
  Filled 2012-03-10 (×15): qty 1

## 2012-03-10 MED ORDER — VITAMIN B-1 100 MG PO TABS
100.0000 mg | ORAL_TABLET | Freq: Every day | ORAL | Status: DC
  Administered 2012-03-10 – 2012-03-24 (×14): 100 mg via ORAL
  Filled 2012-03-10 (×15): qty 1

## 2012-03-10 MED ORDER — FOLIC ACID 1 MG PO TABS
1.0000 mg | ORAL_TABLET | Freq: Every day | ORAL | Status: DC
  Administered 2012-03-10 – 2012-03-19 (×9): 1 mg via ORAL
  Filled 2012-03-10 (×11): qty 1

## 2012-03-10 MED ORDER — ENSURE PO LIQD: 237.0000 mL | Freq: Three times a day (TID) | ORAL | Status: DC

## 2012-03-10 MED ORDER — ENSURE COMPLETE PO LIQD
237.0000 mL | Freq: Three times a day (TID) | ORAL | Status: DC
  Administered 2012-03-11 – 2012-03-24 (×19): 237 mL via ORAL

## 2012-03-10 MED ORDER — DEXTROSE-NACL 5-0.9 % IV SOLN
INTRAVENOUS | Status: DC
  Administered 2012-03-10 – 2012-03-11 (×2): via INTRAVENOUS

## 2012-03-10 NOTE — Evaluation (Signed)
Physical Therapy Evaluation Patient Details Name: [name] MRN: [id number] DOB: 06-09-1940 Today's Date: 03/10/2012 Time: 4098-1191 PT Time Calculation (min): 31 min  PT Assessment / Plan / Recommendation Clinical Impression  Pt is 71 y/o female admitted for pneuomothorax with chest tube insertion.  Pt willing to work however limited due to decondition.  Pt able to ambulate however need intermittent standing rest breaks due to decrease in Sa02.  Pt will benefit from acute PT services to improve overall mobilty and endurance to prepare for safe d/c home.      PT Assessment  Patient needs continued PT services    Follow Up Recommendations  Home health PT;Supervision - Intermittent    Does the patient have the potential to tolerate intense rehabilitation      Barriers to Discharge None      Equipment Recommendations  Tub/shower seat    Recommendations for Other Services     Frequency Min 3X/week    Precautions / Restrictions Precautions Precautions: Fall   Pertinent Vitals/Pain Decrease Sa02 with ambulation to 85-87%.  Pt able improve with pursed lip breathing technique and increase to 91%. Pt ambulated on 4L O2.       Mobility  Bed Mobility Bed Mobility: Supine to Sit Supine to Sit: 5: Supervision;HOB elevated Details for Bed Mobility Assistance: Supervision for safety Transfers Transfers: Sit to Stand;Stand to Sit Sit to Stand: 4: Min guard;From bed Stand to Sit: 4: Min guard;To chair/3-in-1 Details for Transfer Assistance: Minguard for safety with cues for hand placement.  Pt tends to keep hands on RW during transfer. Ambulation/Gait Ambulation/Gait Assistance: 4: Min guard Ambulation Distance (Feet): 100 Feet Assistive device: Rolling walker Ambulation/Gait Assistance Details: Minguard for safety with cues for RW placement Gait Pattern: Step-through pattern;Decreased stride length;Shuffle;Narrow base of support Stairs: No    Shoulder Instructions      Exercises     PT Diagnosis: Difficulty walking;Abnormality of gait;Generalized weakness  PT Problem List: Decreased strength;Decreased activity tolerance;Decreased balance;Decreased mobility;Decreased knowledge of use of DME PT Treatment Interventions: DME instruction;Gait training;Stair training;Functional mobility training;Therapeutic activities;Therapeutic exercise;Balance training;Cognitive remediation;Patient/family education   PT Goals Acute Rehab PT Goals PT Goal Formulation: With patient Time For Goal Achievement: 03/24/12 Potential to Achieve Goals: Good Pt will go Sit to Stand: Independently PT Goal: Sit to Stand - Progress: Goal set today Pt will go Stand to Sit: Independently PT Goal: Stand to Sit - Progress: Goal set today Pt will Ambulate: 51 - 150 feet;with modified independence;with rolling walker PT Goal: Ambulate - Progress: Goal set today Pt will Go Up / Down Stairs: 3-5 stairs;with supervision;with least restrictive assistive device PT Goal: Up/Down Stairs - Progress: Goal set today  Visit Information  Last PT Received On: 03/10/12 Assistance Needed: +1 (+2 helpful with lines)    Subjective Data  Subjective: "I'm usually walking all around." Patient Stated Goal: To go home   Prior Functioning  Home Living Lives With: Daughter (son in law) Available Help at Discharge: Family;Available 24 hours/day Type of Home: House Home Access: Stairs to enter [employer] of Steps: 3 Entrance Stairs-Rails: Left Home Layout: One level Bathroom Shower/Tub: Forensic scientist: Standard Bathroom Accessibility: Yes How Accessible: Accessible via walker Home Adaptive Equipment: Walker - rolling Prior Function Level of Independence: Independent Able to Take Stairs?: Yes Driving: Yes Vocation: Retired Musician: No difficulties Dominant Hand: Right    Cognition  Overall Cognitive Status: Appears within functional  limits for tasks assessed/performed Arousal/Alertness: Awake/alert Orientation Level: Appears intact for  tasks assessed Behavior During Session: [facility] for tasks performed    Extremity/Trunk Assessment Right Lower Extremity Assessment RLE ROM/Strength/Tone: Within functional levels Left Lower Extremity Assessment LLE ROM/Strength/Tone: Within functional levels Trunk Assessment Trunk Assessment: Kyphotic   Balance    End of Session PT - End of Session Equipment Utilized During Treatment: Gait belt;Oxygen (4L O2, chest tube) Activity Tolerance: Patient tolerated treatment well Patient left: in bed;with call bell/phone within reach;with family/visitor present Nurse Communication: Mobility status  GP     Mohannad Olivero 03/10/2012, 4:13 PM Jake Shark, PT DPT [phone]

## 2012-03-10 NOTE — Progress Notes (Signed)
2 Days Post-Op Procedure(s) (LRB): CHEST TUBE INSERTION (Right) Subjective: End stage COPD w/ spont PNTX, outside chest tube replaced Small air leak today with new subq air on CXR, re-expansion pulmonary edema better  Objective: Vital signs in last 24 hours: Temp:  [97.9 F (36.6 C)-98.8 F (37.1 C)] 98.5 F (36.9 C) (12/06 0739) Pulse Rate:  [74-77] 76  (12/06 0400) Cardiac Rhythm:  [-] Normal sinus rhythm (12/05 2000) Resp:  [16-20] 20  (12/06 0400) BP: (92-135)/(44-72) 106/44 mmHg (12/06 0400) SpO2:  [92 %-100 %] 100 % (12/06 0739)  Hemodynamic parameters for last 24 hours:  nsr  Intake/Output from previous day: 12/05 0701 - 12/06 0700 In: 60 [I.V.:60] Out: 0  Intake/Output this shift:    Distant breath sounds Small air leak  Lab Results:  Basename 03/10/12 0515 03/09/12 0439  WBC 5.4 6.0  HGB 9.6* 10.1*  HCT 28.7* 30.3*  PLT 178 170   BMET:  Basename 03/10/12 0515 03/09/12 0439  NA 135 136  K 3.0* 3.7  CL 95* 98  CO2 31 30  GLUCOSE 119* 107*  BUN 15 16  CREATININE 0.77 0.85  CALCIUM 8.1* 8.2*    PT/INR:  Basename 03/08/12 0500  LABPROT 13.4  INR 1.03   ABG    Component Value Date/Time   PHART 7.335* 03/09/2012 0330   HCO3 29.1* 03/09/2012 0330   TCO2 30.9 03/09/2012 0330   O2SAT 99.3 03/09/2012 0330   CBG (last 3)  No results found for this basename: GLUCAP:3 in the last 72 hours  Assessment/Plan: S/P Procedure(s) (LRB): CHEST TUBE INSERTION (Right) supp K CT to water seal   LOS: 3 days    VAN TRIGT III,PETER 03/10/2012

## 2012-03-11 ENCOUNTER — Inpatient Hospital Stay (HOSPITAL_COMMUNITY): Payer: Medicare HMO

## 2012-03-11 DIAGNOSIS — J930 Spontaneous tension pneumothorax: Secondary | ICD-10-CM

## 2012-03-11 LAB — BASIC METABOLIC PANEL
BUN: 13 mg/dL (ref 6–23)
CO2: 30 mEq/L (ref 19–32)
Calcium: 7.8 mg/dL — ABNORMAL LOW (ref 8.4–10.5)
Chloride: 100 mEq/L (ref 96–112)
Creatinine, Ser: 0.81 mg/dL (ref 0.50–1.10)
GFR calc Af Amer: 83 mL/min — ABNORMAL LOW (ref >90)
GFR calc non Af Amer: 72 mL/min — ABNORMAL LOW (ref >90)
Glucose, Bld: 115 mg/dL — ABNORMAL HIGH (ref 70–99)
Potassium: 3.6 mEq/L (ref 3.5–5.1)
Sodium: 137 mEq/L (ref 135–145)

## 2012-03-11 LAB — CBC
HCT: 26.6 % — ABNORMAL LOW (ref 36.0–46.0)
Hemoglobin: 8.8 g/dL — ABNORMAL LOW (ref 12.0–15.0)
MCH: 28.1 pg (ref 26.0–34.0)
MCHC: 33.1 g/dL (ref 30.0–36.0)
MCV: 85 fL (ref 78.0–100.0)
Platelets: 171 10*3/uL (ref 150–400)
RBC: 3.13 MIL/uL — ABNORMAL LOW (ref 3.87–5.11)
RDW: 13.8 % (ref 11.5–15.5)
WBC: 4.3 10*3/uL (ref 4.0–10.5)

## 2012-03-11 MED ORDER — POTASSIUM CHLORIDE CRYS ER 20 MEQ PO TBCR
20.0000 meq | EXTENDED_RELEASE_TABLET | Freq: Two times a day (BID) | ORAL | Status: DC
  Administered 2012-03-11 – 2012-03-16 (×12): 20 meq via ORAL
  Filled 2012-03-11 (×14): qty 1

## 2012-03-11 NOTE — Progress Notes (Addendum)
                    [address]            [place] [address]          [phone]     3 Days Post-Op Procedure(s) (LRB): CHEST TUBE INSERTION (Right)  Subjective: No complaints.  Breathing stable.   Objective: Vital signs in last 24 hours: Patient Vitals for the past 24 hrs:  BP Temp Temp src Pulse Resp SpO2 Weight  03/11/12 0600 - - - - - - 134 lb 7.7 oz (61 kg)  03/11/12 0325 - - - - - 100 % -  03/11/12 0300 98/53 mmHg 98 F (36.7 C) Oral 73  20  100 % -  03/10/12 2300 95/44 mmHg 98.1 F (36.7 C) Oral 71  24  98 % -  03/10/12 2055 - - - - - 95 % -  03/10/12 2000 - 98.2 F (36.8 C) - - - - -  03/10/12 1945 110/50 mmHg - - 72  20  100 % -  03/10/12 1630 132/61 mmHg 98.8 F (37.1 C) Oral - - 98 % -  03/10/12 1437 - - Oral - - 100 % -  03/10/12 1200 - 98.5 F (36.9 C) Oral - - - -   Current Weight  03/11/12 134 lb 7.7 oz (61 kg)     Intake/Output from previous day: 12/06 0701 - 12/07 0700 In: 1663 [P.O.:480; I.V.:1183] Out: 351 [Urine:1; Chest Tube:350]    PHYSICAL EXAM:  Heart: RRR Lungs: Slightly decreased BS on R, + small amount of subQ air in R chest  Chest tube: + 1/7 air leak   Lab Results: CBC: Basename 03/11/12 0550 03/10/12 0515  WBC 4.3 5.4  HGB 8.8* 9.6*  HCT 26.6* 28.7*  PLT 171 178   BMET:  Basename 03/10/12 0515 03/09/12 0439  NA 135 136  K 3.0* 3.7  CL 95* 98  CO2 31 30  GLUCOSE 119* 107*  BUN 15 16  CREATININE 0.77 0.85  CALCIUM 8.1* 8.2*    PT/INR: No results found for this basename: LABPROT,INR in the last 72 hours  CXR:  Stable, no obvious ptx, stable to slightly decreased SQ air  Assessment/Plan: S/P Procedure(s) (LRB): CHEST TUBE INSERTION (Right) Continue CTs to water seal for now. Hypokalemia- replace K+ Pulm- stable.  Continue current care. Expected postoperative blood loss anemia- watch H/H  LOS: 4 days    [name],[name] 03/11/2012   chest xray stable Still with air leak I have seen and  examined [name] and agree with the above assessment  and plan.  Delight Ovens MD Beeper [phone] Office [phone] 03/11/2012 11:15 AM

## 2012-03-11 NOTE — Progress Notes (Signed)
Pt complains of burning near chest tube site with gushing sounds noted from chest tube site. Upon assessment new subcutaneous air was felt and heard. Pt VS remain stable. Site was redressed and MD was notified.

## 2012-03-12 ENCOUNTER — Inpatient Hospital Stay (HOSPITAL_COMMUNITY): Payer: Medicare HMO

## 2012-03-12 LAB — BASIC METABOLIC PANEL
CO2: 32 mEq/L (ref 19–32)
Chloride: 103 mEq/L (ref 96–112)
GFR calc Af Amer: >90 mL/min (ref >90)
Potassium: 3.7 mEq/L (ref 3.5–5.1)
Sodium: 140 mEq/L (ref 135–145)

## 2012-03-12 NOTE — Progress Notes (Addendum)
                    [address]            [name] [address]          [phone]     4 Days Post-Op Procedure(s) (LRB): CHEST TUBE INSERTION (Right)  Subjective: Sore at chest tube site this am.  Anxious because of discomfort.  Breathing stable otherwise.   Objective: Vital signs in last 24 hours: Patient Vitals for the past 24 hrs:  BP Temp Temp src Pulse Resp SpO2 Weight  03/12/12 0738 - 98.4 F (36.9 C) Oral - - - -  03/12/12 0500 - - - - - - 136 lb 0.4 oz (61.7 kg)  03/12/12 0340 - - - - - - 136 lb 0.4 oz (61.7 kg)  03/12/12 0332 117/54 mmHg 98.1 F (36.7 C) Oral 72  18  100 % -  03/12/12 0128 - - - 67  20  100 % -  03/12/12 0007 110/48 mmHg 97.8 F (36.6 C) Oral 75  16  98 % -  03/11/12 2007 120/54 mmHg - - 77  - 100 % -  03/11/12 1941 120/54 mmHg 98.3 F (36.8 C) Oral 82  25  98 % -  03/11/12 1606 125/58 mmHg 98.2 F (36.8 C) Oral 84  27  99 % -  03/11/12 1445 - - - - - 96 % -  03/11/12 1211 119/63 mmHg 98.1 F (36.7 C) Oral 76  23  100 % -  03/11/12 0810 - - - - - 98 % -   Current Weight  03/12/12 136 lb 0.4 oz (61.7 kg)     Intake/Output from previous day: 12/07 0701 - 12/08 0700 In: 723 [P.O.:660; I.V.:63] Out: 55 [Urine:5; Chest Tube:50]    PHYSICAL EXAM:  Heart: RRR Lungs: Clear Wound: CT site dressed and dry.  Stable anterior SQ air Chest tube: Some tidaling in chamber, but no obvious air leak with cough    Lab Results: CBC: Basename 03/11/12 0550 03/10/12 0515  WBC 4.3 5.4  HGB 8.8* 9.6*  HCT 26.6* 28.7*  PLT 171 178   BMET:  Basename 03/12/12 0450 03/11/12 0550  NA 140 137  K 3.7 3.6  CL 103 100  CO2 32 30  GLUCOSE 95 115*  BUN 13 13  CREATININE 0.71 0.81  CALCIUM 8.3* 7.8*    PT/INR: No results found for this basename: LABPROT,INR in the last 72 hours  CXR: stable SQ air on R, no obvious ptx  Assessment/Plan: S/P Procedure(s) (LRB): CHEST TUBE INSERTION (Right) No definite air leak, but some  tidaling in the chamber.  Will leave CT to water seal. Hypokalemia- replace K+. Follow up labs in am. Continue current care.   LOS: 5 days    [name],[name] 03/12/2012   small air leak with cough Will leave to water seal and tube in place I have seen and examined [name] and agree with the above assessment  and plan.  Delight Ovens MD Beeper [phone] Office [phone] 03/12/2012 10:53 AM

## 2012-03-13 ENCOUNTER — Inpatient Hospital Stay (HOSPITAL_COMMUNITY): Payer: Medicare HMO

## 2012-03-13 LAB — CBC
HCT: 27.7 % — ABNORMAL LOW (ref 36.0–46.0)
Hemoglobin: 9 g/dL — ABNORMAL LOW (ref 12.0–15.0)
MCH: 28.1 pg (ref 26.0–34.0)
MCHC: 32.5 g/dL (ref 30.0–36.0)
MCV: 86.6 fL (ref 78.0–100.0)
Platelets: 210 K/uL (ref 150–400)
RBC: 3.2 MIL/uL — ABNORMAL LOW (ref 3.87–5.11)
RDW: 14.4 % (ref 11.5–15.5)
WBC: 4 K/uL (ref 4.0–10.5)

## 2012-03-13 NOTE — Progress Notes (Signed)
Physical Therapy Treatment Patient Details Name: [name] MRN: [id number] DOB: 01/01/1941 Today's Date: 03/13/2012 Time: 4098-1191 PT Time Calculation (min): 24 min  PT Assessment / Plan / Recommendation Comments on Treatment Session  Pt with increase SOB this session and noticeable decrease in SaO2 to 85% on 3L with ambulation.  Pt able to return to 91% on 3L with standing rest break and slow deep pursed lip breathing technique.  Educated pt on LAQ, seated March and glut sets.  However unable to perform due to MD entering room.  Next visit plan to further educate on energy conservation.    Follow Up Recommendations  Home health PT;Supervision - Intermittent     Does the patient have the potential to tolerate intense rehabilitation     Barriers to Discharge        Equipment Recommendations       Recommendations for Other Services    Frequency Min 3X/week   Plan Discharge plan remains appropriate;Frequency remains appropriate    Precautions / Restrictions Precautions Precautions: Fall   Pertinent Vitals/Pain C/o chest at chest tube insertion but does not rate    Mobility  Bed Mobility Bed Mobility: Not assessed Transfers Transfers: Sit to Stand;Stand to Sit Sit to Stand: 4: Min guard;From chair/3-in-1 Stand to Sit: 4: Min guard;To chair/3-in-1 Details for Transfer Assistance: Minguard for safety with cues for hand placement Ambulation/Gait Ambulation/Gait Assistance: 4: Min guard Ambulation Distance (Feet): 150 Feet Assistive device: Rolling walker Ambulation/Gait Assistance Details: Minguard for safety with cues for RW placement.  Pt with intermittent rest breaks due to SOB Gait Pattern: Step-through pattern;Decreased stride length;Shuffle;Narrow base of support Stairs: No    Exercises     PT Diagnosis:    PT Problem List:   PT Treatment Interventions:     PT Goals Acute Rehab PT Goals PT Goal Formulation: With patient Time For Goal Achievement:  03/24/12 Potential to Achieve Goals: Good Pt will go Sit to Stand: Independently PT Goal: Sit to Stand - Progress: Progressing toward goal Pt will go Stand to Sit: Independently PT Goal: Stand to Sit - Progress: Progressing toward goal Pt will Ambulate: 51 - 150 feet;with modified independence;with rolling walker PT Goal: Ambulate - Progress: Progressing toward goal  Visit Information  Last PT Received On: 03/13/12 Assistance Needed: +1    Subjective Data  Subjective: "There is still a leak in the chest tube." Patient Stated Goal: To go home hopefully without a chest tube   Cognition  Overall Cognitive Status: Appears within functional limits for tasks assessed/performed Arousal/Alertness: Awake/alert Orientation Level: Appears intact for tasks assessed Behavior During Session: [facility] for tasks performed    Balance     End of Session PT - End of Session Equipment Utilized During Treatment: Gait belt;Oxygen (3L) Activity Tolerance: Patient tolerated treatment well Patient left: in chair;with call bell/phone within reach;with family/visitor present Nurse Communication: Mobility status   GP     Raydon Chappuis 03/13/2012, 12:13 PM Jake Shark, PT DPT [phone]

## 2012-03-13 NOTE — Progress Notes (Signed)
Radiology called with [facility] results, spoke with Gershon Crane, PA about results. Will continue to monitor.

## 2012-03-13 NOTE — Progress Notes (Signed)
Utilization review completed.  

## 2012-03-13 NOTE — Progress Notes (Addendum)
[address]            [place] [address]          [phone]     5 Days Post-Op  Procedure(s) (LRB): CHEST TUBE INSERTION (Right) Subjective: Some pain , intermittent  Objective  Telemetry sinus rhythm  Temp:  [97.9 F (36.6 C)-98.7 F (37.1 C)] 98.7 F (37.1 C) (12/09 0800) Pulse Rate:  [68-84] 79  (12/09 0632) Resp:  [17-26] 25  (12/09 0632) BP: (100-162)/(52-66) 124/60 mmHg (12/09 0800) SpO2:  [94 %-100 %] 97 % (12/09 9147)   Intake/Output Summary (Last 24 hours) at 03/13/12 0926 Last data filed at 03/13/12 0919  Gross per 24 hour  Intake    723 ml  Output     48 ml  Net    675 ml       General appearance: alert, cooperative and no distress Heart: regular rate and rhythm Lungs: sim in right base Abdomen: soft, nontender Extremities: no edema Wound: dressing CDI  Lab Results:  Basename 03/12/12 0450 03/11/12 0550  NA 140 137  K 3.7 3.6  CL 103 100  CO2 32 30  GLUCOSE 95 115*  BUN 13 13  CREATININE 0.71 0.81  CALCIUM 8.3* 7.8*  MG -- --  PHOS -- --   No results found for this basename: AST:2,ALT:2,ALKPHOS:2,BILITOT:2,PROT:2,ALBUMIN:2 in the last 72 hours No results found for this basename: LIPASE:2,AMYLASE:2 in the last 72 hours  Basename 03/13/12 0435 03/11/12 0550  WBC 4.0 4.3  NEUTROABS -- --  HGB 9.0* 8.8*  HCT 27.7* 26.6*  MCV 86.6 85.0  PLT 210 171   No results found for this basename: CKTOTAL:4,CKMB:4,TROPONINI:4 in the last 72 hours No components found with this basename: POCBNP:3 No results found for this basename: DDIMER in the last 72 hours No results found for this basename: HGBA1C in the last 72 hours No results found for this basename: CHOL,HDL,LDLCALC,TRIG,CHOLHDL in the last 72 hours No results found for this basename: TSH,T4TOTAL,FREET3,T3FREE,THYROIDAB in the last 72 hours No results found for this basename: VITAMINB12,FOLATE,FERRITIN,TIBC,IRON,RETICCTPCT in the last 72  hours  Medications: Scheduled    . ALPRAZolam  0.5 mg Oral TID  . aspirin EC  81 mg Oral Daily  . bisacodyl  10 mg Oral Daily  . docusate sodium  100 mg Oral BID  . feeding supplement  237 mL Oral TID WC  . folic acid  1 mg Oral Daily  . guaiFENesin  600 mg Oral BID  . levalbuterol  0.63 mg Nebulization Q6H  . levothyroxine  88 mcg Oral QAC breakfast  . mometasone-formoterol  2 puff Inhalation BID  . multivitamin with minerals  1 tablet Oral Daily  . potassium chloride  20 mEq Oral BID  . senna  1 tablet Oral BID  . sodium chloride  3 mL Intravenous Q12H  . thiamine  100 mg Oral Daily  . tiotropium  18 mcg Inhalation Daily     Radiology/Studies:  Dg Chest Port 1 View  03/13/2012  *RADIOLOGY REPORT*  Clinical Data: Postop chest tube check  PORTABLE CHEST - 1 VIEW  Comparison: Chest radiograph 12/08 1013  Findings: Stable cardiac silhouette.  Right chest tube in place unchanged.  There is a small right apical pneumothorax measuring 5 mm from apical chest wall which is not evident on comparison exam. Extensive subcutaneous emphysema on the right lateral chest wall. No  pulmonary edema.  Right basilar atelectasis noted.  IMPRESSION:  1.  Small right apical pneumothorax was not evident on comparison exam.  Chest tube in place. 2.  Otherwise no interval change. 3.  Right basilar atelectasis and extensive right chest wall emphysema.  This was made a call report.   Original Report Authenticated By: Genevive Bi, M.D.    Dg Chest Port 1 View  03/12/2012  *RADIOLOGY REPORT*  Clinical Data: Follow up right chest tube.  Right chest pain and back pain earlier today shortness of breath.  PORTABLE CHEST - 1 VIEW  Comparison: 03/11/2012  Findings: Right chest tube and right-sided subcutaneous emphysema is stable.  No pneumothorax.  Coarse reticular lung opacities mostly at the bases are also stable likely a combination scarring and subsegmental atelectasis.  No convincing infiltrate.  IMPRESSION: No  significant change from the previous day's study.  No pneumothorax.   Original Report Authenticated By: Amie Portland, M.D.     Chest tube + air leak, 70 cc drainage    INR: Will add last result for INR, ABG once components are confirmed Will add last 4 CBG results once components are confirmed  There is no problem list on file for this patient.  Assessment/Plan: S/P Procedure(s) (LRB): CHEST TUBE INSERTION (Right)  1. Small pntx and air leak- may have to place back to suction but will observe for now    LOS: 6 days    GOLD,WAYNE E 12/9/20139:26 AM     Chart reviewed, patient examined, agree with above. She has a tiny apical ptx that is unchanged. There is a little more subcutaneous air over the past couple days. There is air leak with each breath so it seems to be coming out of the tube adequately.Will continue to water seal for now to minimize the intra-pleural pressure gradient.

## 2012-03-14 ENCOUNTER — Inpatient Hospital Stay (HOSPITAL_COMMUNITY): Payer: Medicare HMO

## 2012-03-14 MED ORDER — DEXTROSE 5 % IV SOLN
1.0000 g | INTRAVENOUS | Status: DC
  Administered 2012-03-14 – 2012-03-16 (×3): 1 g via INTRAVENOUS
  Filled 2012-03-14 (×4): qty 10

## 2012-03-14 MED ORDER — LEVALBUTEROL HCL 0.63 MG/3ML IN NEBU
0.6300 mg | INHALATION_SOLUTION | Freq: Three times a day (TID) | RESPIRATORY_TRACT | Status: DC
  Administered 2012-03-15 – 2012-03-24 (×27): 0.63 mg via RESPIRATORY_TRACT
  Filled 2012-03-14 (×31): qty 3

## 2012-03-14 MED ORDER — IOHEXOL 350 MG/ML SOLN
100.0000 mL | Freq: Once | INTRAVENOUS | Status: AC | PRN
  Administered 2012-03-14: 76 mL via INTRAVENOUS

## 2012-03-14 NOTE — Progress Notes (Addendum)
[address]            [place] [address]          [phone]     6 Days Post-Op  Procedure(s) (LRB): CHEST TUBE INSERTION (Right) Subjective: Feels ok  Objective  Telemetry sinus rhythm  Temp:  [98 F (36.7 C)-98.5 F (36.9 C)] 98.5 F (36.9 C) (12/10 0728) Pulse Rate:  [71] 71  (12/10 0342) Resp:  [16-26] 16  (12/10 0342) BP: (102-148)/(61-71) 122/70 mmHg (12/10 0342) SpO2:  [98 %-100 %] 99 % (12/10 0728) Weight:  [128 lb 12 oz (58.4 kg)] 128 lb 12 oz (58.4 kg) (12/10 0600)   Intake/Output Summary (Last 24 hours) at 03/14/12 0838 Last data filed at 03/13/12 2000  Gross per 24 hour  Intake    423 ml  Output     24 ml  Net    399 ml       General appearance: alert, cooperative and no distress Heart: regular rate and rhythm Lungs: dim in bases Abdomen: soft, nontender Wound: ok  Lab Results:  Basename 03/12/12 0450  NA 140  K 3.7  CL 103  CO2 32  GLUCOSE 95  BUN 13  CREATININE 0.71  CALCIUM 8.3*  MG --  PHOS --   No results found for this basename: AST:2,ALT:2,ALKPHOS:2,BILITOT:2,PROT:2,ALBUMIN:2 in the last 72 hours No results found for this basename: LIPASE:2,AMYLASE:2 in the last 72 hours  Basename 03/13/12 0435  WBC 4.0  NEUTROABS --  HGB 9.0*  HCT 27.7*  MCV 86.6  PLT 210   No results found for this basename: CKTOTAL:4,CKMB:4,TROPONINI:4 in the last 72 hours No components found with this basename: POCBNP:3 No results found for this basename: DDIMER in the last 72 hours No results found for this basename: HGBA1C in the last 72 hours No results found for this basename: CHOL,HDL,LDLCALC,TRIG,CHOLHDL in the last 72 hours No results found for this basename: TSH,T4TOTAL,FREET3,T3FREE,THYROIDAB in the last 72 hours No results found for this basename: VITAMINB12,FOLATE,FERRITIN,TIBC,IRON,RETICCTPCT in the last 72 hours  Medications: Scheduled    . ALPRAZolam  0.5 mg Oral TID  . aspirin EC  81 mg Oral  Daily  . bisacodyl  10 mg Oral Daily  . docusate sodium  100 mg Oral BID  . feeding supplement  237 mL Oral TID WC  . folic acid  1 mg Oral Daily  . guaiFENesin  600 mg Oral BID  . levalbuterol  0.63 mg Nebulization Q6H  . levothyroxine  88 mcg Oral QAC breakfast  . mometasone-formoterol  2 puff Inhalation BID  . multivitamin with minerals  1 tablet Oral Daily  . potassium chloride  20 mEq Oral BID  . senna  1 tablet Oral BID  . sodium chloride  3 mL Intravenous Q12H  . thiamine  100 mg Oral Daily  . tiotropium  18 mcg Inhalation Daily     Radiology/Studies:  Dg Chest Port 1 View  03/13/2012  *RADIOLOGY REPORT*  Clinical Data: Postop chest tube check  PORTABLE CHEST - 1 VIEW  Comparison: Chest radiograph 12/08 1013  Findings: Stable cardiac silhouette.  Right chest tube in place unchanged.  There is a small right apical pneumothorax measuring 5 mm from apical chest wall which is not evident on comparison exam. Extensive subcutaneous emphysema on the right lateral chest wall. No pulmonary edema.  Right basilar atelectasis noted.  IMPRESSION:  1.  Small right apical pneumothorax was not evident on comparison exam.  Chest tube in place. 2.  Otherwise no interval change. 3.  Right basilar atelectasis and extensive right chest wall emphysema.  This was made a call report.   Original Report Authenticated By: Genevive Bi, M.D.     INR: Will add last result for INR, ABG once components are confirmed Will add last 4 CBG results once components are confirmed  Chest tube- + air leak, minimal drainage Chest xray- tiny right apical cap Assessment/Plan: S/P Procedure(s) (LRB): CHEST TUBE INSERTION (Right)  1. Keep chest tube to H2O seal, may ultimately require VATS     LOS: 7 days    GOLD,WAYNE E 12/10/20138:38 AM   patient examined and medical record reviewed,agree with above note.  Patient with inc subQ air and persistent air leak- no resp distress Will repeat chest CT with lung  expanded and plan VATS 12-13 if leak not resolved Plan d/w patient and family VAN TRIGT III,PETER 03/14/2012

## 2012-03-14 NOTE — Progress Notes (Signed)
Pt called RN to room. Pt stated she coughed and now has tightness around neck and trouble breathing. Pt is swollen around neck. sats are 98% on 3L. Resp rate is 24. Notified MD, received order to place back on 20 of suction and get portable stat chest xray. Chest tube is now on 20 of suction with air leak. Xray ordered. Deniece Portela is at bedside. Will continue to monitor.

## 2012-03-14 NOTE — Care Management Note (Signed)
    Page 1 of 2   03/20/2012     2:58:09 PM   CARE MANAGEMENT NOTE 03/20/2012  Patient:  [name], [name]   Account Number:  [id number]  Date Initiated:  03/08/2012  Documentation initiated by:  Donn Pierini  Subjective/Objective Assessment:   Pt admitted from [facility] with pneumothorax and chest tube- for OR today for VATS     Action/Plan:   PTA pt lived at home with home O2   Anticipated DC Date:  03/16/2012   Anticipated DC Plan:  HOME W HOME HEALTH SERVICES      DC Planning Services  CM consult      [facility] Choice  Resumption Of Svcs/PTA Provider   Choice offered to / List presented to:             [facility] agency  [facility]   Status of service:  In process, will continue to follow Medicare Important Message given?   (If response is "NO", the following Medicare IM given date fields will be blank) Date Medicare IM given:   Date Additional Medicare IM given:    Discharge Disposition:    Per UR Regulation:  Reviewed for med. necessity/level of care/duration of stay  If discussed at Long Length of Stay Meetings, dates discussed:   03/14/2012    Comments:  03-20-12 Charlane Ferretti, RNBSN [phone] Talked with patient and daughter at bedside.  12-13 - post op VATS.  Plans to go home to daughters,  Has RW, will need tub seat.  Has had [facility] in past but would like to change to [facility].  Have already checked with insurance and they say this is in network and ok to use. CM will continue to follow for discharge needs.  03/15/11- 1215- Donn Pierini RN, BSN [phone] Pt active with [facility] for HH-RN- per PT recommending HH-PT- pt will need resumption orders for [facility]- with the addition of HH-PT- please order HH prior to discharge. NCM to continue to follow  03/13/12- 1525- Donn Pierini RN, BSN [phone] Pt still with Chest tube- has subQ air- PT eval ordered- recommending HH- NCM to cont. to follow pt progression - anticipate home with [facility] when  stable.  03/08/12- 1100- Donn Pierini RN, BSN [phone] Pt to OR today for chest tube placement ? need for VATS if chest tube does not resolve pntx- NCM to follow post op for pt progression and d/c needs.

## 2012-03-15 ENCOUNTER — Inpatient Hospital Stay (HOSPITAL_COMMUNITY): Payer: Medicare HMO

## 2012-03-15 DIAGNOSIS — I517 Cardiomegaly: Secondary | ICD-10-CM

## 2012-03-15 LAB — BLOOD GAS, ARTERIAL
Acid-Base Excess: 6.3 mmol/L — ABNORMAL HIGH (ref 0.0–2.0)
Bicarbonate: 31.2 mEq/L — ABNORMAL HIGH (ref 20.0–24.0)
Drawn by: 35135
O2 Content: 4 L/min
O2 Saturation: 98.8 %
Patient temperature: 98.2
TCO2: 32.9 mmol/L (ref 0–100)
pCO2 arterial: 52.5 mmHg — ABNORMAL HIGH (ref 35.0–45.0)
pH, Arterial: 7.391 (ref 7.350–7.450)
pO2, Arterial: 93.6 mmHg (ref 80.0–100.0)

## 2012-03-15 MED ORDER — ALPRAZOLAM 0.5 MG PO TABS
1.0000 mg | ORAL_TABLET | Freq: Three times a day (TID) | ORAL | Status: DC | PRN
  Administered 2012-03-15 – 2012-03-19 (×6): 1 mg via ORAL
  Administered 2012-03-19: 0.5 mg via ORAL
  Administered 2012-03-20 – 2012-03-23 (×5): 1 mg via ORAL
  Filled 2012-03-15: qty 1
  Filled 2012-03-15 (×3): qty 2
  Filled 2012-03-15: qty 1
  Filled 2012-03-15 (×10): qty 2

## 2012-03-15 NOTE — Progress Notes (Signed)
Physical Therapy Treatment Patient Details Name: DIANNA EWALD MRN: [id number] DOB: [birth year] Today's Date: 03/15/2012 Time: 4098-1191 PT Time Calculation (min): 28 min  PT Assessment / Plan / Recommendation Comments on Treatment Session  Pt able to increase ambulation distance with occasional standing breaks.  Pts Sa02 maintained ~94% throughout ambulation on 4L.  Educated on energy conservation and handout given.  Pt very receptive on energy conservation and asked several questions.      Follow Up Recommendations  Home health PT;Supervision - Intermittent     Does the patient have the potential to tolerate intense rehabilitation     Barriers to Discharge        Equipment Recommendations  Other (comment) (recommend rollater for energy conservation )    Recommendations for Other Services    Frequency Min 3X/week   Plan Discharge plan remains appropriate;Frequency remains appropriate    Precautions / Restrictions Precautions Precautions: Fall   Pertinent Vitals/Pain No c/o pain just mild discomfort at chest tube site.    Mobility  Bed Mobility Bed Mobility: Supine to Sit;Sitting - Scoot to Edge of Bed;Sit to Supine Supine to Sit: 5: Supervision;HOB elevated Sitting - Scoot to Edge of Bed: 5: Supervision Sit to Supine: 5: Supervision Details for Bed Mobility Assistance: Supervision for safety Transfers Transfers: Sit to Stand;Stand to Sit Sit to Stand: 4: Min guard;From chair/3-in-1 Stand to Sit: 4: Min guard;To chair/3-in-1 Details for Transfer Assistance: Minguard for safety with cues for hand placement Ambulation/Gait Ambulation/Gait Assistance: 4: Min guard Ambulation Distance (Feet): 200 Feet Assistive device: Rolling walker Ambulation/Gait Assistance Details: Minguard for safety with cues for RW placement Gait Pattern: Step-through pattern;Decreased stride length;Shuffle;Narrow base of support Stairs: No    Exercises     PT Diagnosis:    PT Problem List:   PT  Treatment Interventions:     PT Goals Acute Rehab PT Goals PT Goal Formulation: With patient Time For Goal Achievement: 03/24/12 Potential to Achieve Goals: Good Pt will go Sit to Stand: Independently PT Goal: Sit to Stand - Progress: Progressing toward goal Pt will go Stand to Sit: Independently PT Goal: Stand to Sit - Progress: Progressing toward goal Pt will Ambulate: 51 - 150 feet;with modified independence;with rolling walker PT Goal: Ambulate - Progress: Progressing toward goal  Visit Information  Last PT Received On: 03/15/12 Assistance Needed: +1    Subjective Data  Subjective: "I'm doing ok just wondering what they are going to decide." Patient Stated Goal: To eventually go home   Cognition  Overall Cognitive Status: Appears within functional limits for tasks assessed/performed Arousal/Alertness: Awake/alert Orientation Level: Appears intact for tasks assessed Behavior During Session: [facility] for tasks performed    Balance     End of Session PT - End of Session Equipment Utilized During Treatment: Gait belt;Oxygen (4L) Activity Tolerance: Patient tolerated treatment well Patient left: in bed;with call bell/phone within reach Nurse Communication: Mobility status   GP     Luccas Towell 03/15/2012, 3:08 PM

## 2012-03-15 NOTE — Progress Notes (Addendum)
                   [address]            [place] [address]          [phone]    7 Days Post-Op Procedure(s) (LRB): CHEST TUBE INSERTION (Right)  Subjective: Patient states breathing is no worse than in previous days.  Objective: Vital signs in last 24 hours: Temp:  [97.6 F (36.4 C)-98.6 F (37 C)] 97.6 F (36.4 C) (12/11 1100) Pulse Rate:  [76-95] 95  (12/11 1100) Cardiac Rhythm:  [-] Normal sinus rhythm (12/11 1100) Resp:  [12-25] 21  (12/11 1100) BP: (99-146)/(46-80) 146/70 mmHg (12/11 1100) SpO2:  [97 %-100 %] 99 % (12/11 1100) Weight:  [128 lb 4.9 oz (58.2 kg)] 128 lb 4.9 oz (58.2 kg) (12/11 0429)    12/10 0701 - 12/11 0700 In: 770 [P.O.:720; IV Piggyback:50] Out: 86 [Urine:5; Stool:1; Chest Tube:80]  Physical Exam:  Cardiovascular: RRR Pulmonary: Clear to auscultation on left; diminished breath sounds on right; no rales, wheezes, or rhonchi. Subcutaneous emphysema bilateral neck and right chest wall Abdomen: Soft, non tender, bowel sounds present. Extremities: No lower extremity edema.   Lab Results: CBC:  Basename 03/13/12 0435  WBC 4.0  HGB 9.0*  HCT 27.7*  PLT 210   BMET: No results found for this basename: NA:2,K:2,CL:2,CO2:2,GLUCOSE:2,BUN:2,CREATININE:2,CALCIUM:2 in the last 72 hours  PT/INR: No results found for this basename: LABPROT,INR in the last 72 hours ABG:  INR: Will add last result for INR, ABG once components are confirmed Will add last 4 CBG results once components are confirmed  Assessment/Plan:  1. CV - SR. Echo done-await results 2.  Pulmonary -Still with air leak.On 4L of O2 via [place].Chest x ray appears unchanged this am (no pneumothorax, extensive subcutaneous emphysema, hyperinflation of lungs (COPD), and mild cardiomegaly. May need VATS if air leak continues 3.Last H and H 9 and 27.7   ZIMMERMAN,DONIELLE MPA-C 03/15/2012,1:30 PM      patient examined and medical record reviewed,agree with above  note.Plan VATS Fri am, stapling blebs and poss resection of RUL mass VAN TRIGT III,PETER 03/15/2012

## 2012-03-15 NOTE — Progress Notes (Signed)
  Echocardiogram 2D Echocardiogram has been performed.  Adrain Nesbit 03/15/2012, 1:08 PM

## 2012-03-16 ENCOUNTER — Inpatient Hospital Stay (HOSPITAL_COMMUNITY): Payer: Medicare HMO

## 2012-03-16 LAB — COMPREHENSIVE METABOLIC PANEL
ALT: 21 U/L (ref 0–35)
AST: 22 U/L (ref 0–37)
Albumin: 2.8 g/dL — ABNORMAL LOW (ref 3.5–5.2)
Alkaline Phosphatase: 116 U/L (ref 39–117)
BUN: 12 mg/dL (ref 6–23)
CO2: 32 mEq/L (ref 19–32)
Calcium: 8.8 mg/dL (ref 8.4–10.5)
Chloride: 96 mEq/L (ref 96–112)
Creatinine, Ser: 0.78 mg/dL (ref 0.50–1.10)
GFR calc Af Amer: >90 mL/min (ref >90)
GFR calc non Af Amer: 83 mL/min — ABNORMAL LOW (ref >90)
Glucose, Bld: 191 mg/dL — ABNORMAL HIGH (ref 70–99)
Potassium: 4.2 mEq/L (ref 3.5–5.1)
Sodium: 137 mEq/L (ref 135–145)
Total Bilirubin: 0.3 mg/dL (ref 0.3–1.2)
Total Protein: 5.5 g/dL — ABNORMAL LOW (ref 6.0–8.3)

## 2012-03-16 LAB — URINALYSIS, ROUTINE W REFLEX MICROSCOPIC
Bilirubin Urine: NEGATIVE
Glucose, UA: NEGATIVE mg/dL
Hgb urine dipstick: NEGATIVE
Ketones, ur: 15 mg/dL — AB
Nitrite: NEGATIVE
Protein, ur: NEGATIVE mg/dL
Specific Gravity, Urine: 1.034 — ABNORMAL HIGH (ref 1.005–1.030)
Urobilinogen, UA: 0.2 mg/dL (ref 0.0–1.0)
pH: 7.5 (ref 5.0–8.0)

## 2012-03-16 LAB — CBC
HCT: 28.2 % — ABNORMAL LOW (ref 36.0–46.0)
HCT: 28.8 % — ABNORMAL LOW (ref 36.0–46.0)
Hemoglobin: 9.3 g/dL — ABNORMAL LOW (ref 12.0–15.0)
Hemoglobin: 9.2 g/dL — ABNORMAL LOW (ref 12.0–15.0)
MCH: 28.6 pg (ref 26.0–34.0)
MCH: 27.8 pg (ref 26.0–34.0)
MCHC: 33 g/dL (ref 30.0–36.0)
MCHC: 31.9 g/dL (ref 30.0–36.0)
MCV: 86.8 fL (ref 78.0–100.0)
MCV: 87 fL (ref 78.0–100.0)
Platelets: 235 10*3/uL (ref 150–400)
Platelets: 259 10*3/uL (ref 150–400)
RBC: 3.25 MIL/uL — ABNORMAL LOW (ref 3.87–5.11)
RBC: 3.31 MIL/uL — ABNORMAL LOW (ref 3.87–5.11)
RDW: 14.9 % (ref 11.5–15.5)
RDW: 15.1 % (ref 11.5–15.5)
WBC: 3.8 10*3/uL — ABNORMAL LOW (ref 4.0–10.5)
WBC: 4.1 10*3/uL (ref 4.0–10.5)

## 2012-03-16 LAB — URINE MICROSCOPIC-ADD ON

## 2012-03-16 LAB — PROTIME-INR
INR: 1 (ref 0.00–1.49)
Prothrombin Time: 13.1 seconds (ref 11.6–15.2)

## 2012-03-16 LAB — BASIC METABOLIC PANEL
BUN: 13 mg/dL (ref 6–23)
CO2: 34 mEq/L — ABNORMAL HIGH (ref 19–32)
Calcium: 8.7 mg/dL (ref 8.4–10.5)
Chloride: 97 mEq/L (ref 96–112)
Creatinine, Ser: 0.84 mg/dL (ref 0.50–1.10)
GFR calc Af Amer: 80 mL/min — ABNORMAL LOW (ref >90)
GFR calc non Af Amer: 69 mL/min — ABNORMAL LOW (ref >90)
Glucose, Bld: 103 mg/dL — ABNORMAL HIGH (ref 70–99)
Potassium: 4.5 mEq/L (ref 3.5–5.1)
Sodium: 137 mEq/L (ref 135–145)

## 2012-03-16 LAB — APTT: aPTT: 38 seconds — ABNORMAL HIGH (ref 24–37)

## 2012-03-16 LAB — PREPARE RBC (CROSSMATCH)

## 2012-03-16 LAB — ABO/RH: ABO/RH(D): A NEG

## 2012-03-16 MED ORDER — DEXTROSE 5 % IV SOLN
1.5000 g | INTRAVENOUS | Status: AC
  Administered 2012-03-17: 1.5 g via INTRAVENOUS
  Filled 2012-03-16: qty 1.5

## 2012-03-16 MED ORDER — IOHEXOL 300 MG/ML  SOLN
100.0000 mL | Freq: Once | INTRAMUSCULAR | Status: AC | PRN
  Administered 2012-03-16: 100 mL via INTRAVENOUS

## 2012-03-16 NOTE — Progress Notes (Addendum)
                   [address]            [place] [address]          [phone]    8 Days Post-Op Procedure(s) (LRB): CHEST TUBE INSERTION (Right)  Subjective: Patient states breathing is ok as long as she does not have to exert herself.  Objective: Vital signs in last 24 hours: Temp:  [97.5 F (36.4 C)-98.2 F (36.8 C)] 98 F (36.7 C) (12/12 0315) Pulse Rate:  [72-95] 95  (12/12 0315) Cardiac Rhythm:  [-] Normal sinus rhythm (12/12 0315) Resp:  [19-28] 28  (12/12 0315) BP: (102-146)/(54-70) 136/57 mmHg (12/12 0315) SpO2:  [96 %-100 %] 99 % (12/12 0740) Weight:  [124 lb (56.246 kg)] 124 lb (56.246 kg) (12/12 0300)    12/11 0701 - 12/12 0700 In: 770 [P.O.:720; IV Piggyback:50] Out: 54 [Urine:3; Stool:1; Chest Tube:50]  Physical Exam:  Cardiovascular: RRR Pulmonary: Clear to auscultation on left; diminished breath sounds on right; no rales, wheezes, or rhonchi. Subcutaneous emphysema bilateral neck and right chest wall and down right arm Abdomen: Soft, non tender, bowel sounds present.    Lab Results: CBC:  Basename 03/16/12 0427  WBC 3.8*  HGB 9.3*  HCT 28.2*  PLT 235   BMET:   Basename 03/16/12 0427  NA 137  K 4.5  CL 97  CO2 34*  GLUCOSE 103*  BUN 13  CREATININE 0.84  CALCIUM 8.7    PT/INR: No results found for this basename: LABPROT,INR in the last 72 hours ABG:  INR: Will add last result for INR, ABG once components are confirmed Will add last 4 CBG results once components are confirmed  Assessment/Plan:  1. CV - SR. Echo done yesterday. Results showed LVEF 55-60%, no significant valvular disease, ascending aorta mildly dilated, and no pericardial effusion. 2.  Pulmonary -Still with air leak.On 4L of O2 via [place].Chest x ray appears unchanged this am (no pneumothorax, extensive subcutaneous emphysema, hyperinflation of lungs (COPD), and mild cardiomegaly. Right VATS in am 3.Last H and H 9 and 27.7   ZIMMERMAN,DONIELLE  MPA-C 03/16/2012,7:51 AM   Persistent air leak > 1 week need VATS CT shows RUL nodule, no nodes or other dz, head CT pending 2D echo good LV fx PFT's severe COPD FEV1 600 cc Plan R VATS, staple blebs, excisional bx RUL nodule- at inc risk due to COPD- d/w pt and daughter possibility of postop vent OR in AM

## 2012-03-17 ENCOUNTER — Encounter (HOSPITAL_COMMUNITY): Payer: Self-pay | Admitting: Anesthesiology

## 2012-03-17 ENCOUNTER — Encounter (HOSPITAL_COMMUNITY): Payer: Self-pay | Admitting: Certified Registered Nurse Anesthetist

## 2012-03-17 ENCOUNTER — Inpatient Hospital Stay (HOSPITAL_COMMUNITY): Payer: Medicare HMO

## 2012-03-17 ENCOUNTER — Encounter (HOSPITAL_COMMUNITY)
Admission: EM | Disposition: A | Discharge status: Home / Self Care | Payer: Self-pay | Source: Home / Self Care | Attending: Cardiothoracic Surgery

## 2012-03-17 ENCOUNTER — Inpatient Hospital Stay (HOSPITAL_COMMUNITY): Payer: Medicare HMO | Admitting: Anesthesiology

## 2012-03-17 DIAGNOSIS — D381 Neoplasm of uncertain behavior of trachea, bronchus and lung: Secondary | ICD-10-CM

## 2012-03-17 DIAGNOSIS — J930 Spontaneous tension pneumothorax: Secondary | ICD-10-CM

## 2012-03-17 HISTORY — PX: RESECTION OF APICAL BLEB: SHX5078

## 2012-03-17 HISTORY — PX: VIDEO ASSISTED THORACOSCOPY: SHX5073

## 2012-03-17 LAB — POCT I-STAT 3, ART BLOOD GAS (G3+)
Acid-Base Excess: 3 mmol/L — ABNORMAL HIGH (ref 0.0–2.0)
Acid-Base Excess: 6 mmol/L — ABNORMAL HIGH (ref 0.0–2.0)
Acid-Base Excess: 6 mmol/L — ABNORMAL HIGH (ref 0.0–2.0)
Bicarbonate: 30.3 mEq/L — ABNORMAL HIGH (ref 20.0–24.0)
Bicarbonate: 31.9 mEq/L — ABNORMAL HIGH (ref 20.0–24.0)
Bicarbonate: 32.8 mEq/L — ABNORMAL HIGH (ref 20.0–24.0)
O2 Saturation: 97 %
O2 Saturation: 99 %
O2 Saturation: 99 %
Patient temperature: 98.1
Patient temperature: 98.2
Patient temperature: 98.2
TCO2: 32 mmol/L (ref 0–100)
TCO2: 34 mmol/L (ref 0–100)
TCO2: 34 mmol/L (ref 0–100)
pCO2 arterial: 59.6 mmHg (ref 35.0–45.0)
pCO2 arterial: 51.7 mmHg — ABNORMAL HIGH (ref 35.0–45.0)
pCO2 arterial: 57 mmHg — ABNORMAL HIGH (ref 35.0–45.0)
pH, Arterial: 7.313 — ABNORMAL LOW (ref 7.350–7.450)
pH, Arterial: 7.398 (ref 7.350–7.450)
pH, Arterial: 7.367 (ref 7.350–7.450)
pO2, Arterial: 99 mmHg (ref 80.0–100.0)
pO2, Arterial: 130 mmHg — ABNORMAL HIGH (ref 80.0–100.0)
pO2, Arterial: 136 mmHg — ABNORMAL HIGH (ref 80.0–100.0)

## 2012-03-17 LAB — BLOOD GAS, ARTERIAL
Acid-Base Excess: 4 mmol/L — ABNORMAL HIGH (ref 0.0–2.0)
TCO2: 32.3 mmol/L (ref 0–100)
pCO2 arterial: 66.7 mmHg (ref 35.0–45.0)
pO2, Arterial: 253 mmHg — ABNORMAL HIGH (ref 80.0–100.0)

## 2012-03-17 LAB — GLUCOSE, CAPILLARY: Glucose-Capillary: 120 mg/dL — ABNORMAL HIGH (ref 70–99)

## 2012-03-17 SURGERY — VIDEO ASSISTED THORACOSCOPY
Anesthesia: General | Site: Chest | Laterality: Right | Wound class: Clean Contaminated

## 2012-03-17 MED ORDER — OXYCODONE HCL 5 MG PO TABS: 5.0000 mg | ORAL_TABLET | Freq: Once | ORAL | Status: DC | PRN

## 2012-03-17 MED ORDER — FUROSEMIDE 10 MG/ML IJ SOLN
INTRAMUSCULAR | Status: AC
  Administered 2012-03-17: 20 mg via INTRAVENOUS
  Filled 2012-03-17: qty 2

## 2012-03-17 MED ORDER — PANTOPRAZOLE SODIUM 40 MG PO TBEC
40.0000 mg | DELAYED_RELEASE_TABLET | Freq: Every day | ORAL | Status: DC
  Administered 2012-03-17 – 2012-03-24 (×8): 40 mg via ORAL
  Filled 2012-03-17 (×8): qty 1

## 2012-03-17 MED ORDER — LORATADINE 10 MG PO TABS
10.0000 mg | ORAL_TABLET | Freq: Every morning | ORAL | Status: DC
  Administered 2012-03-17 – 2012-03-24 (×8): 10 mg via ORAL
  Filled 2012-03-17 (×8): qty 1

## 2012-03-17 MED ORDER — TRAMADOL HCL 50 MG PO TABS
50.0000 mg | ORAL_TABLET | Freq: Four times a day (QID) | ORAL | Status: DC | PRN
  Administered 2012-03-19: 100 mg via ORAL
  Administered 2012-03-20: 50 mg via ORAL
  Administered 2012-03-20 – 2012-03-23 (×9): 100 mg via ORAL
  Filled 2012-03-17 (×8): qty 2
  Filled 2012-03-17: qty 1
  Filled 2012-03-17 (×2): qty 2

## 2012-03-17 MED ORDER — ONDANSETRON HCL 4 MG/2ML IJ SOLN
INTRAMUSCULAR | Status: DC | PRN
  Administered 2012-03-17: 4 mg via INTRAVENOUS

## 2012-03-17 MED ORDER — VECURONIUM BROMIDE 10 MG IV SOLR
INTRAVENOUS | Status: DC | PRN
  Administered 2012-03-17: 2 mg via INTRAVENOUS

## 2012-03-17 MED ORDER — BUPIVACAINE 0.5 % ON-Q PUMP SINGLE CATH 400 ML
400.0000 mL | INJECTION | Status: DC
  Administered 2012-03-17: 400 mL
  Filled 2012-03-17: qty 400

## 2012-03-17 MED ORDER — MEPERIDINE HCL 25 MG/ML IJ SOLN: 6.2500 mg | INTRAMUSCULAR | Status: DC | PRN

## 2012-03-17 MED ORDER — LACTATED RINGERS IV SOLN
INTRAVENOUS | Status: DC | PRN
  Administered 2012-03-17: 08:00:00 via INTRAVENOUS

## 2012-03-17 MED ORDER — PHENYLEPHRINE HCL 10 MG/ML IJ SOLN
INTRAMUSCULAR | Status: DC | PRN
  Administered 2012-03-17 (×3): 80 ug via INTRAVENOUS

## 2012-03-17 MED ORDER — ONDANSETRON HCL 4 MG/2ML IJ SOLN
4.0000 mg | Freq: Four times a day (QID) | INTRAMUSCULAR | Status: DC | PRN
  Administered 2012-03-18 – 2012-03-23 (×6): 4 mg via INTRAVENOUS
  Filled 2012-03-17 (×6): qty 2

## 2012-03-17 MED ORDER — NALOXONE HCL 0.4 MG/ML IJ SOLN
0.4000 mg | INTRAMUSCULAR | Status: DC | PRN
  Filled 2012-03-17: qty 1

## 2012-03-17 MED ORDER — ROCURONIUM BROMIDE 100 MG/10ML IV SOLN
INTRAVENOUS | Status: DC | PRN
  Administered 2012-03-17: 50 mg via INTRAVENOUS

## 2012-03-17 MED ORDER — SODIUM CHLORIDE 0.9 % IJ SOLN
3.0000 mL | Freq: Two times a day (BID) | INTRAMUSCULAR | Status: DC
  Administered 2012-03-17 – 2012-03-24 (×14): 3 mL via INTRAVENOUS

## 2012-03-17 MED ORDER — PROMETHAZINE HCL 25 MG/ML IJ SOLN: 6.2500 mg | INTRAMUSCULAR | Status: DC | PRN

## 2012-03-17 MED ORDER — VANCOMYCIN HCL IN DEXTROSE 1-5 GM/200ML-% IV SOLN
1000.0000 mg | INTRAVENOUS | Status: DC
  Administered 2012-03-17 – 2012-03-19 (×3): 1000 mg via INTRAVENOUS
  Filled 2012-03-17 (×4): qty 200

## 2012-03-17 MED ORDER — SODIUM CHLORIDE 0.9 % IJ SOLN: 9.0000 mL | INTRAMUSCULAR | Status: DC | PRN

## 2012-03-17 MED ORDER — FENTANYL CITRATE 0.05 MG/ML IJ SOLN
INTRAMUSCULAR | Status: DC | PRN
  Administered 2012-03-17 (×3): 50 ug via INTRAVENOUS
  Administered 2012-03-17 (×2): 25 ug via INTRAVENOUS

## 2012-03-17 MED ORDER — MIDAZOLAM HCL 5 MG/5ML IJ SOLN
INTRAMUSCULAR | Status: DC | PRN
  Administered 2012-03-17 (×2): 2 mg via INTRAVENOUS

## 2012-03-17 MED ORDER — DIPHENHYDRAMINE HCL 12.5 MG/5ML PO ELIX
12.5000 mg | ORAL_SOLUTION | Freq: Four times a day (QID) | ORAL | Status: DC | PRN
  Filled 2012-03-17: qty 5

## 2012-03-17 MED ORDER — KCL IN DEXTROSE-NACL 20-5-0.9 MEQ/L-%-% IV SOLN
INTRAVENOUS | Status: DC
  Administered 2012-03-17 – 2012-03-18 (×2): via INTRAVENOUS
  Filled 2012-03-17 (×5): qty 1000

## 2012-03-17 MED ORDER — GLYCOPYRROLATE 0.2 MG/ML IJ SOLN
INTRAMUSCULAR | Status: DC | PRN
  Administered 2012-03-17: 0.6 mg via INTRAVENOUS

## 2012-03-17 MED ORDER — OXYCODONE HCL 5 MG/5ML PO SOLN: 5.0000 mg | Freq: Once | ORAL | Status: DC | PRN

## 2012-03-17 MED ORDER — BUPIVACAINE ON-Q PAIN PUMP (FOR ORDER SET NO CHG)
INJECTION | Status: DC
  Filled 2012-03-17: qty 1

## 2012-03-17 MED ORDER — ACETAMINOPHEN 10 MG/ML IV SOLN
1000.0000 mg | Freq: Four times a day (QID) | INTRAVENOUS | Status: AC
  Administered 2012-03-17 – 2012-03-18 (×4): 1000 mg via INTRAVENOUS
  Filled 2012-03-17 (×5): qty 100

## 2012-03-17 MED ORDER — HYDROMORPHONE HCL PF 1 MG/ML IJ SOLN
INTRAMUSCULAR | Status: AC
  Filled 2012-03-17: qty 1

## 2012-03-17 MED ORDER — HYDROMORPHONE HCL PF 1 MG/ML IJ SOLN
0.2500 mg | INTRAMUSCULAR | Status: DC | PRN
  Administered 2012-03-17 (×2): 0.5 mg via INTRAVENOUS

## 2012-03-17 MED ORDER — BISACODYL 5 MG PO TBEC
10.0000 mg | DELAYED_RELEASE_TABLET | Freq: Every day | ORAL | Status: DC
  Administered 2012-03-17 – 2012-03-24 (×8): 10 mg via ORAL
  Filled 2012-03-17 (×8): qty 2

## 2012-03-17 MED ORDER — ONDANSETRON HCL 4 MG/2ML IJ SOLN
4.0000 mg | Freq: Four times a day (QID) | INTRAMUSCULAR | Status: DC | PRN
  Filled 2012-03-17: qty 2

## 2012-03-17 MED ORDER — SENNOSIDES-DOCUSATE SODIUM 8.6-50 MG PO TABS
1.0000 | ORAL_TABLET | Freq: Every evening | ORAL | Status: DC | PRN
  Filled 2012-03-17: qty 1

## 2012-03-17 MED ORDER — DIPHENHYDRAMINE HCL 50 MG/ML IJ SOLN
12.5000 mg | Freq: Four times a day (QID) | INTRAMUSCULAR | Status: DC | PRN
  Filled 2012-03-17: qty 0.25

## 2012-03-17 MED ORDER — FUROSEMIDE 10 MG/ML IJ SOLN
20.0000 mg | Freq: Once | INTRAMUSCULAR | Status: AC
  Administered 2012-03-17: 20 mg via INTRAVENOUS

## 2012-03-17 MED ORDER — NEOSTIGMINE METHYLSULFATE 1 MG/ML IJ SOLN
INTRAMUSCULAR | Status: DC | PRN
  Administered 2012-03-17: 4 mg via INTRAVENOUS

## 2012-03-17 MED ORDER — PROPOFOL 10 MG/ML IV BOLUS
INTRAVENOUS | Status: DC | PRN
  Administered 2012-03-17: 150 mg via INTRAVENOUS

## 2012-03-17 MED ORDER — POTASSIUM CHLORIDE 10 MEQ/50ML IV SOLN: 10.0000 meq | Freq: Every day | INTRAVENOUS | Status: DC | PRN

## 2012-03-17 MED ORDER — PHENYLEPHRINE HCL 10 MG/ML IJ SOLN
10.0000 mg | INTRAVENOUS | Status: DC | PRN
  Administered 2012-03-17: 25 ug/min via INTRAVENOUS

## 2012-03-17 MED ORDER — FENTANYL 10 MCG/ML IV SOLN
INTRAVENOUS | Status: DC
  Administered 2012-03-17: 110 ug via INTRAVENOUS
  Administered 2012-03-17: 120 ug via INTRAVENOUS
  Administered 2012-03-17 – 2012-03-18 (×2): via INTRAVENOUS
  Administered 2012-03-18: 200 ug via INTRAVENOUS
  Administered 2012-03-18: 120 ug via INTRAVENOUS
  Administered 2012-03-18: 160 ug via INTRAVENOUS
  Administered 2012-03-18: 180 ug via INTRAVENOUS
  Administered 2012-03-18: 06:00:00 via INTRAVENOUS
  Administered 2012-03-18: 140 ug via INTRAVENOUS
  Administered 2012-03-18: 70.09 ug via INTRAVENOUS
  Administered 2012-03-19: 190 ug via INTRAVENOUS
  Administered 2012-03-19: 127.9 ug via INTRAVENOUS
  Administered 2012-03-19: 120 ug via INTRAVENOUS
  Administered 2012-03-19: 110 ug via INTRAVENOUS
  Administered 2012-03-19: 40 ug via INTRAVENOUS
  Administered 2012-03-19: 07:00:00 via INTRAVENOUS
  Administered 2012-03-19: 130 ug via INTRAVENOUS
  Administered 2012-03-20: 40 ug via INTRAVENOUS
  Administered 2012-03-20: 70 ug via INTRAVENOUS
  Administered 2012-03-20: 130 ug via INTRAVENOUS
  Administered 2012-03-20: 10 ug via INTRAVENOUS
  Administered 2012-03-20: 50 ug via INTRAVENOUS
  Administered 2012-03-20: 130 ug via INTRAVENOUS
  Administered 2012-03-21: 60 ug via INTRAVENOUS
  Administered 2012-03-21: 50 ug via INTRAVENOUS
  Filled 2012-03-17 (×7): qty 50

## 2012-03-17 SURGICAL SUPPLY — 64 items
BAG DECANTER FOR FLEXI CONT (MISCELLANEOUS) IMPLANT
BLADE SURG 11 STRL SS (BLADE) ×4 IMPLANT
CANISTER SUCTION 2500CC (MISCELLANEOUS) ×4 IMPLANT
CATH KIT ON Q 5IN SLV (PAIN MANAGEMENT) ×2 IMPLANT
CATH ROBINSON RED A/P 22FR (CATHETERS) IMPLANT
CATH THORACIC 28FR (CATHETERS) IMPLANT
CATH THORACIC 36FR (CATHETERS) IMPLANT
CATH THORACIC 36FR RT ANG (CATHETERS) IMPLANT
CLEANER TIP ELECTROSURG 2X2 (MISCELLANEOUS) ×2 IMPLANT
CLOTH BEACON ORANGE TIMEOUT ST (SAFETY) ×4 IMPLANT
CONN ST 1/4X3/8  BEN (MISCELLANEOUS) ×2
CONN ST 1/4X3/8 BEN (MISCELLANEOUS) IMPLANT
CONN Y 3/8X3/8X3/8  BEN (MISCELLANEOUS) ×2
CONN Y 3/8X3/8X3/8 BEN (MISCELLANEOUS) IMPLANT
CONT SPEC 4OZ CLIKSEAL STRL BL (MISCELLANEOUS) ×12 IMPLANT
COVER SURGICAL LIGHT HANDLE (MISCELLANEOUS) ×8 IMPLANT
DRAPE LAPAROSCOPIC ABDOMINAL (DRAPES) ×4 IMPLANT
DRAPE WARM FLUID 44X44 (DRAPE) ×4 IMPLANT
ELECT REM PT RETURN 9FT ADLT (ELECTROSURGICAL) ×4
ELECTRODE REM PT RTRN 9FT ADLT (ELECTROSURGICAL) ×2 IMPLANT
GLOVE BIO SURGEON STRL SZ7.5 (GLOVE) ×8 IMPLANT
GOWN STRL NON-REIN LRG LVL3 (GOWN DISPOSABLE) ×12 IMPLANT
HANDLE STAPLE ENDO GIA SHORT (STAPLE) ×2
KIT BASIN OR (CUSTOM PROCEDURE TRAY) ×4 IMPLANT
KIT ROOM TURNOVER OR (KITS) ×4 IMPLANT
KIT SUCTION CATH 14FR (SUCTIONS) ×4 IMPLANT
NS IRRIG 1000ML POUR BTL (IV SOLUTION) ×8 IMPLANT
PACK CHEST (CUSTOM PROCEDURE TRAY) ×4 IMPLANT
PAD ARMBOARD 7.5X6 YLW CONV (MISCELLANEOUS) ×8 IMPLANT
RELOAD EGIA 45 MED/THCK PURPLE (STAPLE) ×10 IMPLANT
RELOAD EGIA 60 MED/THCK PURPLE (STAPLE) ×8 IMPLANT
RELOAD STAPLE 60 MED/THCK ART (STAPLE) IMPLANT
SEALANT PROGEL (MISCELLANEOUS) ×4 IMPLANT
SEALANT SURG COSEAL 4ML (VASCULAR PRODUCTS) IMPLANT
SOLUTION ANTI FOG 6CC (MISCELLANEOUS) ×4 IMPLANT
SPONGE GAUZE 4X4 12PLY (GAUZE/BANDAGES/DRESSINGS) ×4 IMPLANT
SPONGE TONSIL 1.25 RF SGL STRG (GAUZE/BANDAGES/DRESSINGS) ×6 IMPLANT
STAPLER ENDO GIA 12 SHRT THIN (STAPLE) IMPLANT
STAPLER ENDO GIA 12MM SHORT (STAPLE) ×2 IMPLANT
SUT CHROMIC 3 0 SH 27 (SUTURE) ×4 IMPLANT
SUT ETHILON 3 0 PS 1 (SUTURE) ×2 IMPLANT
SUT PROLENE 3 0 SH DA (SUTURE) IMPLANT
SUT PROLENE 4 0 RB 1 (SUTURE)
SUT PROLENE 4-0 RB1 .5 CRCL 36 (SUTURE) IMPLANT
SUT SILK  1 MH (SUTURE) ×4
SUT SILK 1 MH (SUTURE) ×4 IMPLANT
SUT SILK 2 0SH CR/8 30 (SUTURE) IMPLANT
SUT SILK 3 0SH CR/8 30 (SUTURE) IMPLANT
SUT VIC AB 1 CTX 18 (SUTURE) ×4 IMPLANT
SUT VIC AB 2 TP1 27 (SUTURE) ×2 IMPLANT
SUT VIC AB 2-0 CTX 36 (SUTURE) ×2 IMPLANT
SUT VIC AB 3-0 X1 27 (SUTURE) ×2 IMPLANT
SUT VICRYL 0 UR6 27IN ABS (SUTURE) IMPLANT
SUT VICRYL 2 TP 1 (SUTURE) IMPLANT
SWAB COLLECTION DEVICE MRSA (MISCELLANEOUS) IMPLANT
SYSTEM SAHARA CHEST DRAIN ATS (WOUND CARE) ×4 IMPLANT
TAPE CLOTH SOFT 2X10 (GAUZE/BANDAGES/DRESSINGS) ×2 IMPLANT
TIP APPLICATOR SPRAY EXTEND 16 (VASCULAR PRODUCTS) ×2 IMPLANT
TOWEL OR 17X24 6PK STRL BLUE (TOWEL DISPOSABLE) ×4 IMPLANT
TOWEL OR 17X26 10 PK STRL BLUE (TOWEL DISPOSABLE) ×8 IMPLANT
TRAP SPECIMEN MUCOUS 40CC (MISCELLANEOUS) IMPLANT
TRAY FOLEY CATH 14FR (SET/KITS/TRAYS/PACK) ×4 IMPLANT
TUBE ANAEROBIC SPECIMEN COL (MISCELLANEOUS) IMPLANT
WATER STERILE IRR 1000ML POUR (IV SOLUTION) ×8 IMPLANT

## 2012-03-17 NOTE — Progress Notes (Signed)
PT Cancellation Note  Patient Details Name: [name] MRN: [id number] DOB: [birth year]   Cancelled Treatment:    Reason Eval/Treat Not Completed: Patient at procedure or test/unavailable (Pt currently in OR.  Will hold PT session today.)    [name] 03/17/2012, 1:09 PM [place], PT DPT [phone]

## 2012-03-17 NOTE — Progress Notes (Signed)
TCTS BRIEF SICU PROGRESS NOTE  Day of Surgery  S/P Procedure(s) (LRB): VIDEO ASSISTED THORACOSCOPY (Right) RESECTION OF APICAL BLEB (Right)   Awake and alert Good pain control NSR w/ stable BP O2 sats 99-100% on BiPAP ABG 7.40/52/130 (improved) + air leak  Plan: Continue BiPAP for now and monitor in ICU  [name] 03/17/2012 6:38 PM

## 2012-03-17 NOTE — Anesthesia Procedure Notes (Signed)
Procedure Name: Intubation Date/Time: 03/17/2012 7:57 AM Performed by: Hermelinda Dellen A Pre-anesthesia Checklist: Patient identified, Emergency Drugs available, Suction available, Patient being monitored and Timeout performed Patient Re-evaluated:Patient Re-evaluated prior to inductionOxygen Delivery Method: Circle system utilized Preoxygenation: Pre-oxygenation with 100% oxygen Intubation Type: IV induction Ventilation: Oral airway inserted - appropriate to patient size and Mask ventilation without difficulty Laryngoscope Size: Mac and 3 Grade View: Grade I Tube type: Oral Endobronchial tube: Left and Double lumen EBT and 35 Fr Number of attempts: 2 (difficulty advancing tube into L main bronchus) Placement Confirmation: ETT inserted through vocal cords under direct vision,  positive ETCO2 and breath sounds checked- equal and bilateral Secured at: 30 cm Tube secured with: Tape Dental Injury: Teeth and Oropharynx as per pre-operative assessment

## 2012-03-17 NOTE — Transfer of Care (Signed)
Immediate Anesthesia Transfer of Care Note  Patient: [name]  Procedure(s) Performed: Procedure(s) (LRB) with comments: VIDEO ASSISTED THORACOSCOPY (Right) RESECTION OF APICAL BLEB (Right) - stapling of bleb  Patient Location: PACU  Anesthesia Type:General  Level of Consciousness: awake, alert  and oriented  Airway & Oxygen Therapy: Patient Spontanous Breathing and Patient connected to face mask oxygen  Post-op Assessment: Report given to PACU RN, Post -op Vital signs reviewed and stable and Patient moving all extremities X 4  Post vital signs: Reviewed and stable  Complications: No apparent anesthesia complications

## 2012-03-17 NOTE — Progress Notes (Signed)
The patient was examined and preop studies reviewed. There has been no change from the prior exam and the patient is ready for surgery.  Plan R VATS this am on [place]

## 2012-03-17 NOTE — Preoperative (Signed)
Beta Blockers   Reason not to administer Beta Blockers:Not Applicable 

## 2012-03-17 NOTE — Anesthesia Postprocedure Evaluation (Signed)
  Anesthesia Post-op Note  Patient: [name]  Procedure(s) Performed: Procedure(s) (LRB) with comments: VIDEO ASSISTED THORACOSCOPY (Right) RESECTION OF APICAL BLEB (Right) - stapling of bleb  Patient Location: PACU  Anesthesia Type:General  Level of Consciousness: awake  Airway and Oxygen Therapy: Patient connected to nasal cannula oxygen  Post-op Pain: mild  Post-op Assessment: Post-op Vital signs reviewed, Patient's Cardiovascular Status Stable and Respiratory Function Stable  Post-op Vital Signs: stable  Complications: No apparent anesthesia complications

## 2012-03-17 NOTE — Anesthesia Preprocedure Evaluation (Addendum)
Anesthesia Evaluation  Patient identified by MRN, date of birth, ID band Patient awake    Reviewed: Allergy & Precautions, H&P , NPO status , Patient's Chart, lab work & pertinent test results  History of Anesthesia Complications Negative for: history of anesthetic complications  Airway Mallampati: II TM Distance: >3 FB Neck ROM: Full    Dental  (+) Edentulous Upper, Dental Advisory Given and Edentulous Lower   Pulmonary shortness of breath and with exertion, COPD oxygen dependent,  + rhonchi   + decreased breath sounds      Cardiovascular hypertension, Pt. on medications Rhythm:Regular Rate:Normal     Neuro/Psych    GI/Hepatic negative GI ROS, Neg liver ROS,   Endo/Other  negative endocrine ROS  Renal/GU      Musculoskeletal   Abdominal   Peds  Hematology negative hematology ROS (+)   Anesthesia Other Findings   Reproductive/Obstetrics                          Anesthesia Physical Anesthesia Plan  ASA: III  Anesthesia Plan: General   Post-op Pain Management:    Induction:   Airway Management Planned: Double Lumen EBT  Additional Equipment: CVP and Arterial line  Intra-op Plan:   Post-operative Plan: Extubation in OR  Informed Consent: I have reviewed the patients History and Physical, chart, labs and discussed the procedure including the risks, benefits and alternatives for the proposed anesthesia with the patient or authorized representative who has indicated his/her understanding and acceptance.   Dental advisory given  Plan Discussed with: CRNA and Surgeon  Anesthesia Plan Comments:         Anesthesia Quick Evaluation

## 2012-03-17 NOTE — Brief Op Note (Signed)
03/07/2012 - 03/17/2012  10:12 AM  PATIENT:  [name]  71 y.o. female  PRE-OPERATIVE DIAGNOSIS: 1. Spontaneous right pneumothorax  2. RUL lung nodules   POST-OPERATIVE DIAGNOSIS: 1. Spontaneous right pneumothorax  2. RUL lung nodules    PROCEDURE: RIGHT VIDEO ASSISTED THORACOSCOPY, RIGHT MINI THORACOTOMY, MECHANICAL PLEURODESIS, WEDGE RESECTION of RUL, STAPELING?WDGE RESECTIONS OF RUL APICAL BLEBS x 2, ON Q PLACEMENT  SURGEON:  Surgeon(s) and Role:    * Kerin Perna, MD - Primary  PHYSICIAN ASSISTANT: Doree Fudge PA-C   ANESTHESIA:   general  EBL:  Total I/O In: 1250 [I.V.:1250] Out: -   BLOOD ADMINISTERED:none  DRAINS: ONE 28 French Chest Tube(s) in the right pleural space and one 28 Bard drain in the right pleural space    SPECIMEN:  Source of Specimen:  RUL wedge resection  DISPOSITION OF SPECIMEN:  PATHOLOGY. Frozen section showed NSCC.  COUNTS CORRECT:  YES  DICTATION: .Dragon Dictation  PLAN OF CARE: Admit to inpatient   PATIENT DISPOSITION:  PACU - hemodynamically stable.   Delay start of Pharmacological VTE agent (>24hrs) due to surgical blood loss or risk of bleeding: yes

## 2012-03-18 ENCOUNTER — Inpatient Hospital Stay (HOSPITAL_COMMUNITY): Payer: Medicare HMO

## 2012-03-18 LAB — CBC
HCT: 28.4 % — ABNORMAL LOW (ref 36.0–46.0)
Hemoglobin: 9.2 g/dL — ABNORMAL LOW (ref 12.0–15.0)
MCH: 28.2 pg (ref 26.0–34.0)
MCHC: 32.4 g/dL (ref 30.0–36.0)
MCV: 87.1 fL (ref 78.0–100.0)
Platelets: 284 10*3/uL (ref 150–400)
RBC: 3.26 MIL/uL — ABNORMAL LOW (ref 3.87–5.11)
RDW: 15.3 % (ref 11.5–15.5)
WBC: 8.3 10*3/uL (ref 4.0–10.5)

## 2012-03-18 LAB — POCT I-STAT 3, ART BLOOD GAS (G3+)
Acid-Base Excess: 7 mmol/L — ABNORMAL HIGH (ref 0.0–2.0)
Bicarbonate: 33.3 mEq/L — ABNORMAL HIGH (ref 20.0–24.0)
O2 Saturation: 99 %
Patient temperature: 98.4
TCO2: 35 mmol/L (ref 0–100)
pCO2 arterial: 58.3 mmHg (ref 35.0–45.0)
pH, Arterial: 7.364 (ref 7.350–7.450)
pO2, Arterial: 171 mmHg — ABNORMAL HIGH (ref 80.0–100.0)

## 2012-03-18 LAB — BASIC METABOLIC PANEL
BUN: 11 mg/dL (ref 6–23)
CO2: 29 mEq/L (ref 19–32)
Calcium: 7.9 mg/dL — ABNORMAL LOW (ref 8.4–10.5)
Chloride: 97 mEq/L (ref 96–112)
Creatinine, Ser: 0.82 mg/dL (ref 0.50–1.10)
GFR calc Af Amer: 82 mL/min — ABNORMAL LOW (ref >90)
GFR calc non Af Amer: 71 mL/min — ABNORMAL LOW (ref >90)
Glucose, Bld: 143 mg/dL — ABNORMAL HIGH (ref 70–99)
Potassium: 4.6 mEq/L (ref 3.5–5.1)
Sodium: 133 mEq/L — ABNORMAL LOW (ref 135–145)

## 2012-03-18 NOTE — Progress Notes (Signed)
TCTS BRIEF SICU PROGRESS NOTE  1 Day Post-Op  S/P Procedure(s) (LRB): VIDEO ASSISTED THORACOSCOPY (Right) RESECTION OF APICAL BLEB (Right)   Stable day Sitting up eating supper O2 sats 94% on 2 L/min  Plan: Continue current plan  Mandeep Ferch H 03/18/2012 5:48 PM

## 2012-03-18 NOTE — Progress Notes (Signed)
   CARDIOTHORACIC SURGERY PROGRESS NOTE  1 Day Post-Op  S/P Procedure(s) (LRB): VIDEO ASSISTED THORACOSCOPY (Right) RESECTION OF APICAL BLEB (Right)  Subjective: Looks good.  Mild soreness. Breathing comfortably.  Objective: Vital signs in last 24 hours: Temp:  [97.4 F (36.3 C)-98.8 F (37.1 C)] 98.8 F (37.1 C) (12/14 0744) Pulse Rate:  [67-92] 90  (12/14 1000) Cardiac Rhythm:  [-] Normal sinus rhythm (12/14 0800) Resp:  [9-27] 19  (12/14 1000) BP: (87-168)/(50-87) 113/51 mmHg (12/14 1000) SpO2:  [88 %-100 %] 97 % (12/14 1000) Arterial Line BP: (111-172)/(44-70) 126/48 mmHg (12/14 1000) FiO2 (%):  [40 %-50 %] 50 % (12/14 0428) Weight:  [57.4 kg (126 lb 8.7 oz)-57.7 kg (127 lb 3.3 oz)] 57.7 kg (127 lb 3.3 oz) (12/14 0500)  Physical Exam:  Rhythm:   sinus  Breath sounds: clear  Heart sounds:  RRR  Incisions:  Dressings dry  Abdomen:  Soft, non-distended, non-tender  Extremities:  Warm, well-perfused  Chest tube(s):  No air leak, low volume serosanguinous drainage   Intake/Output from previous day: 12/13 0701 - 12/14 0700 In: 3444.3 [P.O.:50; I.V.:2894.3; IV Piggyback:500] Out: 2410 [Urine:1990; Chest Tube:420] Intake/Output this shift: Total I/O In: 514 [I.V.:514] Out: 310 [Urine:300; Chest Tube:10]  Lab Results:  [facility] 03/18/12 0341 03/16/12 0830  WBC 8.3 4.1  HGB 9.2* 9.2*  HCT 28.4* 28.8*  PLT 284 259   BMET:  Basename 03/18/12 0341 03/16/12 0830  NA 133* 137  K 4.6 4.2  CL 97 96  CO2 29 32  GLUCOSE 143* 191*  BUN 11 12  CREATININE 0.82 0.78  CALCIUM 7.9* 8.8    CBG (last 3)   Basename 03/17/12 1427  GLUCAP 120*    CXR:   *RADIOLOGY REPORT*  Clinical Data: Right chest tube, postop VATS  PORTABLE CHEST - 1 VIEW  Comparison: 03/17/2012  Findings: Surgical changes/suture lines in the right upper  hemithorax. Two right apical chest tubes. No definite  pneumothorax is seen.  Mild bibasilar atelectasis. Left lung is otherwise clear.  The  heart is normal in size.  Stable right IJ venous catheter.  Subcutaneous emphysema along the right lateral chest wall and  bilateral neck, stable versus mildly decreased.  IMPRESSION:  Postsurgical changes in the right hemithorax.  Two right apical chest tubes. No pneumothorax is seen.  Original Report Authenticated By: Charline Bills, M.D.  Assessment/Plan: S/P Procedure(s) (LRB): VIDEO ASSISTED THORACOSCOPY (Right) RESECTION OF APICAL BLEB (Right)  Doing well POD1 Mobilize Decrease IV fluids pulm toilet D/C aline  Ruthella Kirchman H 03/18/2012 10:37 AM

## 2012-03-19 ENCOUNTER — Inpatient Hospital Stay (HOSPITAL_COMMUNITY): Payer: Medicare HMO

## 2012-03-19 LAB — COMPREHENSIVE METABOLIC PANEL
AST: 24 U/L (ref 0–37)
CO2: 29 mEq/L (ref 19–32)
Calcium: 7.6 mg/dL — ABNORMAL LOW (ref 8.4–10.5)
Creatinine, Ser: 0.82 mg/dL (ref 0.50–1.10)
GFR calc Af Amer: 82 mL/min — ABNORMAL LOW (ref >90)
GFR calc non Af Amer: 71 mL/min — ABNORMAL LOW (ref >90)
Total Protein: 4.3 g/dL — ABNORMAL LOW (ref 6.0–8.3)

## 2012-03-19 LAB — CBC
MCH: 28.6 pg (ref 26.0–34.0)
MCHC: 32.6 g/dL (ref 30.0–36.0)
MCV: 87.7 fL (ref 78.0–100.0)
Platelets: 227 10*3/uL (ref 150–400)
RBC: 2.76 MIL/uL — ABNORMAL LOW (ref 3.87–5.11)

## 2012-03-19 MED ORDER — FUROSEMIDE 10 MG/ML IJ SOLN
20.0000 mg | Freq: Once | INTRAMUSCULAR | Status: AC
  Administered 2012-03-19: 20 mg via INTRAVENOUS
  Filled 2012-03-19: qty 2

## 2012-03-19 NOTE — Progress Notes (Signed)
   CARDIOTHORACIC SURGERY PROGRESS NOTE  2 Days Post-Op  S/P Procedure(s) (LRB): VIDEO ASSISTED THORACOSCOPY (Right) RESECTION OF APICAL BLEB (Right)  Subjective: Feels sore in chest but good analgesia with PCA.  + productive cough  Objective: Vital signs in last 24 hours: Temp:  [98.4 F (36.9 C)-98.8 F (37.1 C)] 98.7 F (37.1 C) (12/15 0736) Pulse Rate:  [73-108] 80  (12/15 0900) Cardiac Rhythm:  [-] Normal sinus rhythm (12/15 0800) Resp:  [14-23] 22  (12/15 0900) BP: (91-143)/(44-90) 97/50 mmHg (12/15 0800) SpO2:  [87 %-100 %] 95 % (12/15 0929) Weight:  [58.7 kg (129 lb 6.6 oz)] 58.7 kg (129 lb 6.6 oz) (12/15 0500)  Physical Exam:  Rhythm:   sinus  Breath sounds: Scattered rhonchi  Heart sounds:  RRR  Incisions:  Dressings dry  Abdomen:  soft  Extremities:  warm  Chest tube(s):  Low volume serosanguinous output, no air leak   Intake/Output from previous day: 12/14 0701 - 12/15 0700 In: 1623.8 [P.O.:360; I.V.:1063.8; IV Piggyback:200] Out: 1805 [Urine:1585; Chest Tube:220] Intake/Output this shift: Total I/O In: 293 [P.O.:240; I.V.:53] Out: 35 [Urine:35]  Lab Results:  Basename 03/19/12 0400 03/18/12 0341  WBC 5.7 8.3  HGB 7.9* 9.2*  HCT 24.2* 28.4*  PLT 227 284   BMET:  Basename 03/19/12 0400 03/18/12 0341  NA 133* 133*  K 4.3 4.6  CL 99 97  CO2 29 29  GLUCOSE 99 143*  BUN 12 11  CREATININE 0.82 0.82  CALCIUM 7.6* 7.9*    CBG (last 3)   Basename 03/17/12 1427  GLUCAP 120*    CXR:  *RADIOLOGY REPORT*  Clinical Data: Right-sided chest tube  PORTABLE CHEST - 1 VIEW  Comparison: Chest radiograph 03/18/2012  Findings: Two right chest tubes are in place. Right venous line  unchanged. There is no pneumothorax evident on the right. There  is volume loss in the right hemithorax. Left lung is clear.  Stable cardiac silhouette.  IMPRESSION:  1. No significant change.  2. Volume loss in the right hemithorax with two chest tubes in  place.  .    Original Report Authenticated By: Genevive Bi, M.D.    Assessment/Plan: S/P Procedure(s) (LRB): VIDEO ASSISTED THORACOSCOPY (Right) RESECTION OF APICAL BLEB (Right)  Overall doing well POD2 Expected post op acute blood loss anemia, worse   Mobilize  Diuresis  Chest tubes to water seal   Watch anemia closely   [name],[name] 03/19/2012 10:34 AM

## 2012-03-20 ENCOUNTER — Inpatient Hospital Stay (HOSPITAL_COMMUNITY): Payer: Medicare HMO

## 2012-03-20 ENCOUNTER — Encounter (HOSPITAL_COMMUNITY): Payer: Self-pay | Admitting: Cardiothoracic Surgery

## 2012-03-20 LAB — BASIC METABOLIC PANEL
CO2: 32 mEq/L (ref 19–32)
Glucose, Bld: 98 mg/dL (ref 70–99)
Potassium: 4.5 mEq/L (ref 3.5–5.1)
Sodium: 131 mEq/L — ABNORMAL LOW (ref 135–145)

## 2012-03-20 LAB — CBC
HCT: 24 % — ABNORMAL LOW (ref 36.0–46.0)
Hemoglobin: 7.8 g/dL — ABNORMAL LOW (ref 12.0–15.0)
MCH: 28.4 pg (ref 26.0–34.0)
RBC: 2.75 MIL/uL — ABNORMAL LOW (ref 3.87–5.11)

## 2012-03-20 LAB — TYPE AND SCREEN
ABO/RH(D): A NEG
Antibody Screen: NEGATIVE
Unit division: 0
Unit division: 0
Unit division: 0

## 2012-03-20 MED ORDER — FE FUMARATE-B12-VIT C-FA-IFC PO CAPS
1.0000 | ORAL_CAPSULE | Freq: Three times a day (TID) | ORAL | Status: DC
  Administered 2012-03-20 – 2012-03-22 (×6): 1 via ORAL
  Filled 2012-03-20 (×10): qty 1

## 2012-03-20 NOTE — Progress Notes (Deleted)
Transferred to 3306 via wheelchair. Portable monitor and oxygen on. No changes. 

## 2012-03-20 NOTE — Progress Notes (Signed)
3 Days Post-Op Procedure(s) (LRB): VIDEO ASSISTED THORACOSCOPY (Right) RESECTION OF APICAL BLEB (Right) Subjective: Wedge resection RUL Ca Resection of blebs -preop spont PNTX- persistent No air leak, min ct drainage  Objective: Vital signs in last 24 hours: Temp:  [98.2 F (36.8 C)-98.9 F (37.2 C)] 98.9 F (37.2 C) (12/16 0743) Pulse Rate:  [74-119] 119  (12/16 0700) Cardiac Rhythm:  [-] Normal sinus rhythm (12/15 2000) Resp:  [17-27] 18  (12/16 0752) BP: (93-132)/(45-103) 128/65 mmHg (12/16 0700) SpO2:  [93 %-100 %] 93 % (12/16 0752) Weight:  [130 lb 4.7 oz (59.1 kg)] 130 lb 4.7 oz (59.1 kg) (12/16 0500)  Hemodynamic parameters for last 24 hours:   stable Intake/Output from previous day: 12/15 0701 - 12/16 0700 In: 1788 [P.O.:1060; I.V.:526; IV Piggyback:202] Out: 2425 [Urine:2275; Chest Tube:150] Intake/Output this shift:    Lungs clar No air leak  Lab Results:  Basename 03/20/12 0430 03/19/12 0400  WBC 6.0 5.7  HGB 7.8* 7.9*  HCT 24.0* 24.2*  PLT 266 227   BMET:  Basename 03/20/12 0430 03/19/12 0400  NA 131* 133*  K 4.5 4.3  CL 94* 99  CO2 32 29  GLUCOSE 98 99  BUN 12 12  CREATININE 0.85 0.82  CALCIUM 8.0* 7.6*    PT/INR: No results found for this basename: LABPROT,INR in the last 72 hours ABG    Component Value Date/Time   PHART 7.364 03/18/2012 0352   HCO3 33.3* 03/18/2012 0352   TCO2 35 03/18/2012 0352   O2SAT 99.0 03/18/2012 0352   CBG (last 3)   Basename 03/17/12 1427  GLUCAP 120*    Assessment/Plan: S/P Procedure(s) (LRB): VIDEO ASSISTED THORACOSCOPY (Right) RESECTION OF APICAL BLEB (Right) Plan   DC anterior chest tube, po Fe for acute/chronic anemia tx 3300    LOS: 13 days    [name],[name] 03/20/2012

## 2012-03-20 NOTE — Op Note (Signed)
[name], [name] NO.:  [id number]  MEDICAL RECORD NO.:  [id number]  LOCATION:                                 FACILITY:  PHYSICIAN:  Kerin Perna, M.D.  DATE OF BIRTH:  Jan 22, 1941  DATE OF PROCEDURE:  03/17/2012 DATE OF DISCHARGE:                              OPERATIVE REPORT   OPERATION: 1. Right VATS (video-assisted thoracoscopic surgery) with resection of     right upper lobe blebs. 2. 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 3. Pleurodesis, right hemithorax.  PREOPERATIVE DIAGNOSES: 1. Spontaneous right pneumothorax, persistent following chest tube     placement with persistent air leak. 2. Right upper lobe nodule on CT scan. 3. Severe chronic obstructive pulmonary disease, FEV1 550 mL.  POSTOPERATIVE DIAGNOSES: 1. Spontaneous right pneumothorax, persistent following chest tube     placement with persistent air leak. 2. Right upper lobe nodule on CT scan. 3. Severe chronic obstructive pulmonary disease, FEV1 550 mL.  SURGEON:  Kerin Perna, MD  ASSISTANT:  Doree Fudge, PA-C  ANESTHESIA:  General by Dr. Jacklynn Bue.  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  Attempted chest catheter placed at that facility was unsuccessful and she was transferred to [facility] where standard bore chest tube was placed. This resulted in re-expansion of the lung, however, she had persistent air leak.  CT scan of the chest after re-expansion of the lungs showed a suspicious right upper lobe nodule as well as some scarring and a second density of the medial right upper lobe.  There was no mediastinal adenopathy or evidence of upper abdominal metastatic disease.  A brain CT scan was negative for metastatic disease.  Because of her  persistent pneumothorax requiring chest tube placement, she was felt to be candidate for VATS, stapling of blebs, and pleurodesis, and also wedge resection of the lung nodule with minimal resection of lung parenchyma due to her end-stage COPD.  I discussed the procedure with the patient and her family including the potential postoperative course requiring mechanical ventilation, the risk of pneumonia, the risks of bleeding, and death.  She understood and agreed to proceed.  PROCEDURE:  The patient was brought to the operating room and placed supine on the operating table where general anesthesia was induced.  A double-lumen endotracheal tube was positioned by the anesthesiologist. The patient was turned to expose the right chest which was prepped and draped as a sterile field.  A proper time-out was performed.  A small incision was made in the 5th interspace and a VATS scope was inserted. The apex of the upper lobe was involved with adhesions to the chest wall.  These were taken down and the lung was completely mobilized.  I took down the inferior ligament.  There was some bleb disease of the upper lobe.  The nodule in the posterior aspect of the upper lobe was also noted.  This was resected with Endo GIA  stapling devices and an adequate gross margin was taken but preserving as much as long as possible.  We then identified 2-3 areas of bullous bleb disease and 2 of these were excised with the Endo-GIA stapling device and 2 bleb sites were oversewn with 3-0 chromic suture due to the location of the lesions.  Next, a pleurodesis of the parietal pleura of the right thorax was performed. At this point, the pathology report on the frozen section of the lung nodule came back positive for non-small-cell carcinoma with extension up to the margin.  A 2nd margin was then taken where the lesion had been wedged and this was sent for further pathologic examination.  We then gently reinflated the  right lung under water and there was no significant air leak.  Anterior and posterior chest tubes were placed and brought through separate incisions.  The chest wall was closed with #2 Vicryl pericostal.  The muscle layers were closed with #1 interrupted Vicryl.  The subcutaneous and skin layers were closed in running Vicryl. The old chest tube site was closed with interrupted nylon sutures.  An On-Q catheter was placed between the main incision and the chest tube sites and connected to a Marcaine reservoir 0.5%.  The patient was then prepared for potential extubation by the anesthesia team in the operating room.     Kerin Perna, M.D.     PV/MEDQ  D:  03/17/2012  T:  03/17/2012  Job:  [id number]  cc:   Ramon Dredge L. [name], M.D.

## 2012-03-20 NOTE — Progress Notes (Signed)
Transferred to 3306 via wheelchair. Portable monitor and oxygen on. No changes.

## 2012-03-20 NOTE — Progress Notes (Signed)
Physical Therapy Treatment Patient Details Name: [name] MRN: [id number] DOB: July 11, 1940 Today's Date: 03/20/2012 Time: 6578-4696 PT Time Calculation (min): 38 min  PT Assessment / Plan / Recommendation Comments on Treatment Session  Pt's gait was steady and though she walked a good distance she had trouble bring her sats up to a normal level  EHR on 3L 73%, 4L up to 85% and had to incr to 6L [place]  to reach 90% walking.  Slowly increased to 98% on 6L  EHR up to 114bpm    Follow Up Recommendations  Home health PT;Supervision - Intermittent     Does the patient have the potential to tolerate intense rehabilitation     Barriers to Discharge        Equipment Recommendations       Recommendations for Other Services    Frequency Min 3X/week   Plan Discharge plan remains appropriate;Frequency remains appropriate    Precautions / Restrictions Restrictions Weight Bearing Restrictions: No   Pertinent Vitals/Pain  See comments above.    Mobility  Bed Mobility Bed Mobility: Supine to Sit;Sitting - Scoot to Edge of Bed;Sit to Supine Supine to Sit: 5: Supervision;HOB elevated Sitting - Scoot to Edge of Bed: 5: Supervision Sit to Supine: 5: Supervision Details for Bed Mobility Assistance: Supervision for safety Transfers Transfers: Sit to Stand;Stand to Sit Sit to Stand: 4: Min guard;From chair/3-in-1 Stand to Sit: 4: Min guard;To chair/3-in-1 Details for Transfer Assistance: Minguard for safety with cues for hand placement Ambulation/Gait Ambulation/Gait Assistance: 4: Min guard Ambulation Distance (Feet): 250 Feet Assistive device: Other (Comment) (w/c to push) Ambulation/Gait Assistance Details: generally steady gait also steady holding to assistive device during standing rest breaks.  More fatigue today due to having trouble getting sats up. Gait Pattern: Decreased step length - right;Decreased step length - left;Decreased stride length;Narrow base of support Stairs: No     Exercises     PT Diagnosis:    PT Problem List:   PT Treatment Interventions:     PT Goals Acute Rehab PT Goals Time For Goal Achievement: 03/24/12 Potential to Achieve Goals: Good PT Goal: Sit to Stand - Progress: Progressing toward goal PT Goal: Stand to Sit - Progress: Progressing toward goal PT Goal: Ambulate - Progress: Progressing toward goal  Visit Information  Last PT Received On: 03/20/12 Assistance Needed: +1 (+2 helpful with lines)    Subjective Data  Subjective: Oh , I can't ever relax, this is just me.   Cognition  Overall Cognitive Status: Appears within functional limits for tasks assessed/performed Arousal/Alertness: Awake/alert Orientation Level: Appears intact for tasks assessed Behavior During Session: [facility] for tasks performed    Balance  Balance Balance Assessed: Yes Dynamic Standing Balance Dynamic Standing - Balance Support: Bilateral upper extremity supported;During functional activity Dynamic Standing - Level of Assistance: 5: Stand by assistance  End of Session PT - End of Session Equipment Utilized During Treatment: Gait belt;Oxygen Activity Tolerance: Patient limited by fatigue;Patient tolerated treatment well Patient left: Other (comment) (left in w/c for transport to 3300) Nurse Communication: Mobility status   GP     Topher Buenaventura, Eliseo Gum 03/20/2012, 3:50 PM 03/20/2012  [place] Bing, PT [phone] (pager)

## 2012-03-21 ENCOUNTER — Inpatient Hospital Stay (HOSPITAL_COMMUNITY): Payer: Medicare HMO

## 2012-03-21 LAB — BASIC METABOLIC PANEL
BUN: 13 mg/dL (ref 6–23)
CO2: 31 mEq/L (ref 19–32)
Calcium: 8.3 mg/dL — ABNORMAL LOW (ref 8.4–10.5)
Chloride: 90 mEq/L — ABNORMAL LOW (ref 96–112)
Creatinine, Ser: 0.81 mg/dL (ref 0.50–1.10)
GFR calc Af Amer: 83 mL/min — ABNORMAL LOW (ref >90)
GFR calc non Af Amer: 72 mL/min — ABNORMAL LOW (ref >90)
Glucose, Bld: 93 mg/dL (ref 70–99)
Potassium: 4.3 mEq/L (ref 3.5–5.1)
Sodium: 127 mEq/L — ABNORMAL LOW (ref 135–145)

## 2012-03-21 LAB — CBC
HCT: 24.4 % — ABNORMAL LOW (ref 36.0–46.0)
Hemoglobin: 8 g/dL — ABNORMAL LOW (ref 12.0–15.0)
MCH: 28.4 pg (ref 26.0–34.0)
MCHC: 32.8 g/dL (ref 30.0–36.0)
MCV: 86.5 fL (ref 78.0–100.0)
Platelets: 300 10*3/uL (ref 150–400)
RBC: 2.82 MIL/uL — ABNORMAL LOW (ref 3.87–5.11)
RDW: 15.6 % — ABNORMAL HIGH (ref 11.5–15.5)
WBC: 6.9 10*3/uL (ref 4.0–10.5)

## 2012-03-21 MED ORDER — LACTULOSE 10 GM/15ML PO SOLN
20.0000 g | Freq: Every day | ORAL | Status: DC | PRN
  Administered 2012-03-21: 20 g via ORAL
  Filled 2012-03-21: qty 30

## 2012-03-21 MED ORDER — MAGIC MOUTHWASH
5.0000 mL | Freq: Four times a day (QID) | ORAL | Status: DC
  Administered 2012-03-21 – 2012-03-23 (×9): 5 mL via ORAL
  Filled 2012-03-21 (×14): qty 5

## 2012-03-21 NOTE — Progress Notes (Addendum)
[address]            [place] [address]          [phone]     4 Days Post-Op Procedure(s) (LRB): VIDEO ASSISTED THORACOSCOPY (Right) RESECTION OF APICAL BLEB (Right)  Subjective: Nauseated this am.  OOB in chair.  Breathing stable.  Walking in halls without difficulty.   Objective: Vital signs in last 24 hours: Patient Vitals for the past 24 hrs:  BP Temp Temp src Pulse Resp SpO2 Weight  03/21/12 0839 - - - - 18  97 % -  03/21/12 0800 - 97.9 F (36.6 C) Oral - - - -  03/21/12 0730 - - - - - 93 % -  03/21/12 0500 - - - - - - 131 lb 6.3 oz (59.6 kg)  03/21/12 0400 - - - - 22  97 % -  03/21/12 0320 107/70 mmHg 98.8 F (37.1 C) Oral 83  25  95 % -  03/21/12 0000 - - - - 21  98 % -  03/20/12 2349 121/55 mmHg 98.7 F (37.1 C) Oral 80  21  98 % -  03/20/12 2039 - - - - - 96 % -  03/20/12 2001 - - - - 23  97 % -  03/20/12 1951 145/58 mmHg 98.6 F (37 C) Oral 91  21  93 % -  03/20/12 1749 - - - - - 94 % -  03/20/12 1654 129/69 mmHg - - 88  27  93 % -  03/20/12 1600 - - - 87  23  94 % -  03/20/12 1500 140/74 mmHg - - 92  26  90 % -  03/20/12 1400 134/63 mmHg - - 87  21  96 % -  03/20/12 1300 135/60 mmHg - - 90  25  94 % -  03/20/12 1200 120/66 mmHg - - 84  21  96 % -  03/20/12 1146 - 98.7 F (37.1 C) Oral - - - -  03/20/12 1100 133/60 mmHg - - 100  27  93 % -  03/20/12 1000 106/67 mmHg - - 93  24  93 % -  03/20/12 0944 - - - - - 96 % -   Current Weight  03/21/12 131 lb 6.3 oz (59.6 kg)     Intake/Output from previous day: 12/16 0701 - 12/17 0700 In: 543 [P.O.:120; I.V.:423] Out: 1210 [Urine:1110; Chest Tube:100]    PHYSICAL EXAM:  Heart: RRR Lungs: slightly decreased BS in R base Wound: Clean and dry Chest tube: no air leak    Lab Results: CBC: Basename 03/21/12 0300 03/20/12 0430  WBC 6.9 6.0  HGB 8.0* 7.8*  HCT 24.4* 24.0*  PLT 300 266   BMET:  Basename 03/21/12 0300 03/20/12 0430  NA 127* 131*  K 4.3  4.5  CL 90* 94*  CO2 31 32  GLUCOSE 93 98  BUN 13 12  CREATININE 0.81 0.85  CALCIUM 8.3* 8.0*    PT/INR: No results found for this basename: LABPROT,INR in the last 72 hours  CXR: Findings: Grossly unchanged cardiac silhouette and mediastinal  contours. Interval removal of the more medially positioned right-  sided chest tube. Remaining laterally positioned right-sided chest  tube and jugular approach central venous catheter are unchanged.  Interval development of a tiny / small right basilar pneumothorax.  Postsurgical change of the right  lung and associated adjacent  heterogeneous opacities are grossly unchanged. No new focal  airspace opacities. Unchanged bones. The amount of right lateral  chest wall subcutaneous emphysema is grossly unchanged.  IMPRESSION:  1. Interval removal of one of two right-sided chest tubes with  development of a tiny / small right basilar pneumothorax.  2. Stable positioning of remaining support apparatus.  3. Stable postsurgical change of the right lung.    Assessment/Plan: S/P Procedure(s) (LRB): VIDEO ASSISTED THORACOSCOPY (Right) RESECTION OF APICAL BLEB (Right) CT no air leak on water seal and CXR stable.  Hopefully can d/c CT soon. Continue pulm toilet, ambulation. Expected postop blood loss anemia- continue Fe.    LOS: 14 days    [name],[name] 03/21/2012   DC chest tube tomorrow Path of wedge resection pending Dropped O2 sat with ambulation and needed 6 L Will ask for CIR evaluation

## 2012-03-21 NOTE — Progress Notes (Signed)
Thank you for consult on [name]. Note that she is progressing well with therapy and is min to guard assitance for mobility. She is limited by her respiratory status. Therapy notes reviewed and we concur with  HHPT past discharge.

## 2012-03-22 ENCOUNTER — Inpatient Hospital Stay (HOSPITAL_COMMUNITY): Payer: Medicare HMO

## 2012-03-22 LAB — CBC
HCT: 24.8 % — ABNORMAL LOW (ref 36.0–46.0)
Hemoglobin: 8.2 g/dL — ABNORMAL LOW (ref 12.0–15.0)
MCH: 28.6 pg (ref 26.0–34.0)
MCHC: 33.1 g/dL (ref 30.0–36.0)
MCV: 86.4 fL (ref 78.0–100.0)
Platelets: 345 10*3/uL (ref 150–400)
RBC: 2.87 MIL/uL — ABNORMAL LOW (ref 3.87–5.11)
RDW: 15.7 % — ABNORMAL HIGH (ref 11.5–15.5)
WBC: 6.5 10*3/uL (ref 4.0–10.5)

## 2012-03-22 MED ORDER — LACTULOSE 10 GM/15ML PO SOLN
20.0000 g | Freq: Once | ORAL | Status: AC
  Administered 2012-03-22: 20 g via ORAL
  Filled 2012-03-22: qty 30

## 2012-03-22 MED ORDER — SODIUM CHLORIDE 0.9 % IJ SOLN
INTRAMUSCULAR | Status: AC
  Filled 2012-03-22: qty 10

## 2012-03-22 MED ORDER — TIOTROPIUM BROMIDE MONOHYDRATE 18 MCG IN CAPS: 18.0000 ug | ORAL_CAPSULE | Freq: Every day | RESPIRATORY_TRACT | Status: DC

## 2012-03-22 MED ORDER — ADULT MULTIVITAMIN W/MINERALS CH: 1.0000 | ORAL_TABLET | Freq: Every day | ORAL | Status: DC

## 2012-03-22 MED ORDER — TRAMADOL HCL 50 MG PO TABS: 50.0000 mg | ORAL_TABLET | Freq: Four times a day (QID) | ORAL | Status: DC | PRN

## 2012-03-22 NOTE — Progress Notes (Signed)
Physical Therapy Treatment Patient Details Name: [name] MRN: [id number] DOB: [birth year] Today's Date: 03/22/2012 Time: 0865-7846 PT Time Calculation (min): 24 min  PT Assessment / Plan / Recommendation Comments on Treatment Session  Pt able to ambulate with decrease O2 to 4L.  Pts Sa02 decreased as low as 80% however with standing rest break pt able to improve Sa02 to 89%.      Follow Up Recommendations  Home health PT;Supervision - Intermittent     Does the patient have the potential to tolerate intense rehabilitation     Barriers to Discharge        Equipment Recommendations   (continue to recommend rollater)    Recommendations for Other Services    Frequency Min 3X/week   Plan Discharge plan remains appropriate;Frequency remains appropriate    Precautions / Restrictions Restrictions Weight Bearing Restrictions: No   Pertinent Vitals/Pain No c/o pain    Mobility  Bed Mobility Bed Mobility: Not assessed Transfers Transfers: Sit to Stand;Stand to Sit Sit to Stand: 4: Min guard;From chair/3-in-1 Stand to Sit: 4: Min guard;To chair/3-in-1 Details for Transfer Assistance: Minguard for safety with cues for hand placement Ambulation/Gait Ambulation/Gait Assistance: 4: Min guard Ambulation Distance (Feet): 200 Feet Assistive device: Rolling walker Ambulation/Gait Assistance Details: Minguard for safety with multiple standing rest breaks due to SOB. Gait Pattern: Decreased step length - right;Decreased step length - left;Decreased stride length;Narrow base of support Stairs: No    Exercises     PT Diagnosis:    PT Problem List:   PT Treatment Interventions:     PT Goals Acute Rehab PT Goals PT Goal Formulation: With patient Time For Goal Achievement: 03/24/12 Potential to Achieve Goals: Good Pt will go Sit to Stand: Independently PT Goal: Sit to Stand - Progress: Progressing toward goal Pt will go Stand to Sit: Independently PT Goal: Stand to Sit - Progress:  Progressing toward goal Pt will Ambulate: 51 - 150 feet;with modified independence;with rolling walker PT Goal: Ambulate - Progress: Progressing toward goal  Visit Information  Last PT Received On: 03/22/12 Assistance Needed: +1    Subjective Data  Subjective: "I'm feeling ok.  I can try to walk." Patient Stated Goal: To eventually go home   Cognition  Overall Cognitive Status: Appears within functional limits for tasks assessed/performed Arousal/Alertness: Awake/alert Orientation Level: Appears intact for tasks assessed Behavior During Session: [facility] for tasks performed    Balance     End of Session PT - End of Session Equipment Utilized During Treatment: Gait belt;Oxygen (4L) Activity Tolerance: Patient tolerated treatment well Patient left: in chair;with call bell/phone within reach Nurse Communication: Mobility status   GP     Virlee Stroschein 03/22/2012, 9:52 AM Jake Shark, PT DPT [phone]

## 2012-03-22 NOTE — Discharge Summary (Signed)
Physician Discharge Summary  Patient ID: [name] MRN: [id number] DOB/AGE: [birth year] 70 y.o.  Admit date: 03/07/2012 Discharge date: 03/24/2012  Admission Diagnoses: 1.Spontaneuos right pneumothorax 2. Persistent air leak and pneumothorax after chest tube placement 12/3 3. History of COPD 4.History of  tobacco abuse 5.History of hypertension 6.History of pernicious anemia 7.History of thyroid disease  Discharge Diagnoses:   1.Spontaneuos right pneumothorax 2. Persistent air leak and pneumothorax after chest tube placement 12/3 3. History of COPD 4.History of  tobacco abuse 5.History of hypertension 6.History of pernicious anemia 7.History of thyroid disease 8.RUL adenocarcinoma   Procedure (s):  1.Placement of right chest tube, 28-French by Dr. Donata Clay on 03/08/2012 2.Right VATS (video-assisted thoracoscopic surgery) with resection of  right upper lobe blebs, right mini thoracotomy, wedge resection of right upper lobe nodule,  non-small cell carcinoma by frozen, and pleurodesis, right hemithorax by Dr. Donata Clay on 03/17/2012.  Pathology: 1. Lung, wedge biopsy/resection, right upper lobe - INVASIVE WELL TO MODERATELY DIFFERENTIATED ADENOCARCINOMA, SPANNING 2.1 CM IN GREATEST DIMENSION. - TUMOR INVADES INTO VICERAL PLEURA. - LYMPH/VASCULAR INVASION IS IDENTIFIED. - INITIAL MARGIN IS POSITIVE ON ORIGINAL LUMPECTOMY SPECIMEN (PLEASE SEE SPECIMEN FOUR FOR FINAL MARGIN STATUS). - SEE ONCOLOGY TEMPLATE. 2. Lung, bleb(s) / bullae, Right upper lobe - FINDINGS CONSISTENT WITH BLEB / BULLAE - NO TUMOR SEEN 3. Lung, bleb(s) / bullae, Right upper lobe, #2 - FINDINGS CONSISTENT WITH BLEB / BULLAE. - NO TUMOR SEEN. 4. Lung, wedge biopsy/resection, secondary margin, right upper lobe - BENIGN LUNG PARENCHYMA.  TNM CODE: pT2,pNX,PMX  History of Presenting Illness: This is a 71 yo Caucasian, ex smoker, with end stage COPD, on home oxygen who presented to [facility] emergency  room with a complaint of three weeks of shortness of breath. She was found to have new right sided spontaneous pneumothorax on chest x ray. This was her first episode of a spontaneous pneumothorax. Dr. [name] placed an 8 French tube catheter placed in the emergency department. Follow up chest x ray showed the right lung did not re expand and she still had a right pneumothorax. In addition, there was a persistent air leak from the chest tube.Her oxygen saturation remained at 97-98%. Dr. Donata Clay then accepted her in transfer to [facility] ([facility]) for further evaluation and treatment. A CT scan of the chest was done after her arrival here. This showed the right catheter to be in RUL, a possible 1.5 cm densities in right upper and right middle lobes (w/o mediastinal adenopathy), and a large remaining right pneumothorax. Her oxygen saturation remained stable on 2 litres via nasal cannula and she was in no acute distress.  Brief Hospital Course:   Dr. Donata Clay discussed the findings of the CT scan.It was decided she needed to undergo placement of a new right chest tube. A 28 French chest tube was placed on 12/04 in the operating a room. Follow up chest x ray showed re expansion of the right lung. There was no air leak initially from the chest tube. It was placed to water seal. However, she subsequently, did develop a small air leak as well as subcutaneous air on chest x ray. The chest tube continued to have an air leak, remained to water seal, and chest x rays remained stable. It was ultimately decided she would require a right VATS, stapling of blebs, and wedge resection of right upper lobe. Pre op echo done showed no significant valvular disease, ascending aorta with mild dilatation, no pericardial effusion,  and  a preserved LVEF. Pulmonary function tests showed severe COPD and a FEV1 600. Potential risks, complications, and benefits of the surgery were discussed with the patient and she agreed to proceed. She underwent  a right VATS, mini thoracotomy, pleurodesis, wedge RUL, and resection of blebs RUL on 03/18/2012.      She was extubated following surgery. She did require Bipap and was in the ICU for a couple of days post operatively. Her a line and foley were removed early in her post operative course. She was weaned to oxygen via nasal cannula.Chest tubes were placed to water seal on 03/19/2012. She had minimal chest tube output. Her anterior chest tube was removed on 12/16. She did have ABL anemia (also has a history of pernicious anemia). It went as low as 7.8 and 24. She was started on Trinsicon for her anemia. She did not require a transfusion. Her last H and H was up to 8.2 and 24.8.  She was transferred from the ICU to 3300 for further convalescence on 12/16.Remaining chest tube did not have an air leak and chest x ray remained stable. This chest tube was removed on 12/18. She is ambulating with 3 litres oxygen via nasal cannula . She will require oxygen upon discharge (has been on home oxygen).She has been tolerating a diet and has had a bowel movement. She has had nausea for the last few days. She denied abdominal pain or emesis. Trinsicon has been stopped, as this may be the etiology. Provided she remains afebrile, hemodynamically stable, and pending morning round evaluation, she will be surgically stable for discharge on 03/23/2012.   Latest Vital Signs: Blood pressure 133/68, pulse 86, temperature 98.1 F (36.7 C), temperature source Oral, resp. rate 24, height 5\' 4"  (1.626 m), weight 61.2 kg (134 lb 14.7 oz), SpO2 94.00%.  Physical Exam: Cardiovascular: RRR  Pulmonary: Mostly clear on left;slightly diminished at right base; no rales, wheezes, or rhonchi.  Abdomen: Soft, non tender, bowel sounds present.  Extremities: No lower extremity edema.  Wounds: Clean and dry. No erythema or signs of infection.  Chest Tube: no air leak   Discharge Condition:Stable  Recent laboratory studies:  Lab Results   Component Value Date   WBC 6.5 03/22/2012   HGB 8.2* 03/22/2012   HCT 24.8* 03/22/2012   MCV 86.4 03/22/2012   PLT 345 03/22/2012   Lab Results  Component Value Date   NA 127* 03/21/2012   K 4.3 03/21/2012   CL 90* 03/21/2012   CO2 31 03/21/2012   CREATININE 0.81 03/21/2012   GLUCOSE 93 03/21/2012      Diagnostic Studies:   Ct Head W Wo Contrast  03/16/2012  *RADIOLOGY REPORT*  Clinical Data: Lung mass.  Rule out metastatic disease.  CT HEAD WITHOUT AND WITH CONTRAST  Technique:  Contiguous axial images were obtained from the base of the skull through the vertex without and with intravenous contrast.  Contrast: OMNIPAQUE IOHEXOL 300 MG/ML  SOLN  Comparison: CT head 01/15/2004  Findings: Age appropriate atrophy.  Chronic microvascular ischemic changes in the cerebral white matter.  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  Small chronic infarct left cerebellum is unchanged.  No acute infarct. Negative for hemorrhage or mass.  Postcontrast imaging reveals no enhancing mass lesion.  Negative for skull lesion.  There is extensive soft tissue gas in the para pharyngeal fat around the masseter  muscle bilaterally. This is felt to be due to recent chest  surgery.  IMPRESSION: Chronic microvascular ischemic change.  No acute infarct.  Negative for metastatic disease to the brain.   Original Report Authenticated By: Janeece Riggers, M.D.    Ct Chest W Contrast  03/07/2012  *RADIOLOGY REPORT*  Clinical Data: Preoperative chest radiograph for VATS, for right- sided pneumothorax.  CT CHEST WITH CONTRAST  Technique:  Multidetector CT imaging of the chest was performed following the standard protocol during bolus administration of intravenous contrast.  Contrast: 75mL OMNIPAQUE IOHEXOL 300 MG/ML  SOLN  Comparison: Chest radiograph performed earlier today at 04:45 p.m.  Findings: The patient's small right-sided chest tube is noted extending directly into the  anterior aspect of the right upper lung lobe; the distal 4-5 cm of the tube resides within the lung, though retracting it further may cause it to leave the pleural space entirely.  This likely explains the lack of re-expansion status post chest tube placement.  The collapsed appearance of the right lung is somewhat atypical, with an apparent 1.9 cm pleural based nodule noted likely along the posterior aspect of the right middle lobe.  There is also somewhat unusual nodularity at the right upper lobe, measuring approximately 1.3 cm, with suggestion of architectural distortion, though some of this may reflect atelectasis.  A large right-sided pneumothorax is seen.  There is diffuse bilateral emphysematous change, more prominent at the upper lung zones.  The left lung is otherwise clear.  Diffuse coronary artery calcifications are noted.  Trace pericardial fluid remains within normal limits.  The great vessels are grossly unremarkable in appearance, aside from mild soft plaque noted along the proximal left common carotid and right innominate arteries.  No mediastinal lymphadenopathy is seen. The thyroid gland is very diminutive.  No axillary lymphadenopathy is appreciated.  The visualized portions of the liver are unremarkable; the spleen appears somewhat bulky.  Diffuse splenic artery calcification is noted.  Scattered calcific atherosclerotic disease is noted along the proximal abdominal aorta.  No acute osseous abnormalities are seen.  IMPRESSION:  1.  Large right-sided pneumothorax seen. 2.  Small right-sided chest tube noted extending directly into the anterior aspect of the right upper lung lobe; the distal 4-5 cm of the tube resides within the lung, though retracting much further may cause the chest tube to leave the pleural space entirely.  This likely explains the lack of re-expansion status post chest tube placement.  Retraction of the tube could be attempted, or a larger chest tube could be placed. 3.   Somewhat atypical appearance for the collapsed right lung, with an apparent 1.9 cm pleural based nodule likely along the posterior aspect of the right middle lobe, and a possible 1.3 cm nodule at the right upper lobe, with suggestion of architectural distortion. Some of this could reflect atelectasis. 4.  Diffuse bilateral emphysematous change, more prominent at the upper lung zones. 5.  Diffuse coronary artery calcifications seen. 6.  Mild soft plaque noted along the proximal left common carotid and right innominate arteries.  Scattered calcific atherosclerotic disease along the proximal abdominal aorta. 7.  Suspect mild splenomegaly.  These results were called by telephone on 03/07/2012 at 10:02 p.m. to Dr. Zadie Rhine, who verbally acknowledged these results.   Original Report Authenticated By: Tonia Ghent, M.D.    Ct Angio Chest Pe W/cm &/or Wo Cm  03/14/2012  *RADIOLOGY REPORT*  Clinical Data: Short of breath  CT ANGIOGRAPHY CHEST  Technique:  Multidetector CT imaging of the chest using the standard protocol during bolus administration of intravenous  contrast. Multiplanar reconstructed images including MIPs were obtained and reviewed to evaluate the vascular anatomy.  Contrast: 76mL OMNIPAQUE IOHEXOL 350 MG/ML SOLN  Comparison: 03/07/2012  Findings: The previous small caliber thoracostomy has been removed. A large-bore right anterolateral thoracostomy tube has been placed with its tip in the anterior upper right hemithorax. The tube indents the anterior aspect of the right upper lobe but it is within the pleural space. Less than 5% right apical pneumothorax remains.  Large amount of pneumomediastinum.  A large amount of subcutaneous emphysema within the neck, supraclavicular region, and primarily extending over the right anterior and posterior chest wall.  Gas is interposition between mild fascial planes in the right axilla and over the right chest wall.  Negative abnormal mediastinal adenopathy.  No  evidence of aortic dissection.  Aortic valve and aortic arch calcifications are noted. Atherosclerotic vascular calcifications involving the left main and left anterior descending coronary arteries is present. Atherosclerotic changes of the descending aorta are present.  No obvious filling defects in the pulmonary arterial tree to suggest acute pulmonary thromboembolism.  Innominate artery, left subclavian artery, right common carotid artery, right subclavian artery and grossly patent. There is persistent irregular plaque in the left common carotid artery on image 12 causing mild narrowing.  Lung windows redemonstrates these to suspicious lesions in the right lung.  There is a spiculated right upper lobe lesion measuring 1.9 x 0.7 cm on image 24.  There is an ill-defined peripheral and posterior right upper lobe mass measuring 1.7 x 1.6 centimeters on image 41.  Both of these are worrisome for bronchogenic carcinoma.  Severe centrilobular emphysema is present throughout both lungs.  Small right pleural effusion.  Volume loss of the posterior right lung base.  Bronchiectasis at the right lung base.  Lungs remain hyperaerated with thoracic kyphosis.  No obvious destructive bone lesion.  Extensive calcifications within the pancreas and pancreatic atrophy are likely related to chronic pancreatitis.  Post cholecystectomy.  IMPRESSION: Large bore right chest tube placement with markedly improved right pneumothorax.  Large amount of soft tissue emphysema in the supraclavicular regions, neck, and over the right chest wall.  Large amount of pneumomediastinum.  No evidence of aortic dissection or pulmonary thromboembolism.  There are two worrisome lesions in the right upper lobe as described.  Both are worrisome for bronchogenic carcinoma.  PET CT can be performed to further delineate.   Original Report Authenticated By: Jolaine Click, M.D.    Dg Chest Port 1 View  03/22/2012  *RADIOLOGY REPORT*  Clinical Data: Status post  VATS  PORTABLE CHEST - 1 VIEW  Comparison: 03/21/2012  Findings: Right IJ catheter tip is in the cavoatrial junction. There is a right-sided chest tube in place.  Tiny right apical pneumothorax is identified.  Heart size is normal.  Postsurgical change of the right lung with associated adjacent heterogeneous opacities are again noted.  No pleural effusion or edema.  Stable appearance of the right lateral chest wall subcutaneous emphysema.  IMPRESSION:  1.  Right-sided chest tube in place with tiny right apical pneumothorax. 2.  Stable postsurgical change involving the right lung.   Original Report Authenticated By: Signa Kell, M.D.     Discharge Orders    Future Appointments: Provider: Department: Dept Phone: Center:   04/12/2012 1:00 PM Kerin Perna, MD [facility] [phone] [phone]      Discharge Medications:   Medication List     As of 03/22/2012 10:37 AM  STOP taking these medications         theophylline 200 MG 12 hr tablet   Commonly known as: THEODUR      TUDORZA PRESSAIR 400 MCG/ACT Aepb   Generic drug: Aclidinium Bromide      TAKE these medications         albuterol (2.5 MG/3ML) 0.083% nebulizer solution   Commonly known as: PROVENTIL   Take 2.5 mg by nebulization 4 (four) times daily. *May use up to every 4 hours as needed for shortness of breath*      ALPRAZolam 1 MG tablet   Commonly known as: XANAX   Take 1 mg by mouth 3 (three) times daily as needed. For anxiety and/or sleep      aspirin 81 MG chewable tablet   Chew 81 mg by mouth every morning.      cyanocobalamin 1000 MCG/ML injection   Commonly known as: (VITAMIN B-12)   Inject 1,000 mcg into the muscle every 30 (thirty) days.      Fluticasone-Salmeterol 250-50 MCG/DOSE Aepb   Commonly known as: ADVAIR   Inhale 1 puff into the lungs every 12 (twelve) hours.      guaiFENesin 600 MG 12 hr tablet   Commonly known as: MUCINEX   Take 600 mg by mouth every  morning.      ibuprofen 200 MG tablet   Commonly known as: ADVIL,MOTRIN   Take 600 mg by mouth every 6 (six) hours as needed. For pain      levothyroxine 88 MCG tablet   Commonly known as: SYNTHROID, LEVOTHROID   Take 88 mcg by mouth daily before breakfast. BRAND MEDICALLY NECESSARY      loratadine 10 MG tablet   Commonly known as: CLARITIN   Take 10 mg by mouth every morning.      multivitamin with minerals Tabs   Take 1 tablet by mouth daily.      NON FORMULARY   OXYGEN      24/7      potassium chloride SA 20 MEQ tablet   Commonly known as: K-DUR,KLOR-CON   Take 20 mEq by mouth daily.      tiotropium 18 MCG inhalation capsule   Commonly known as: SPIRIVA   Place 1 capsule (18 mcg total) into inhaler and inhale daily.      traMADol 50 MG tablet   Commonly known as: ULTRAM   Take 1-2 tablets (50-100 mg total) by mouth every 6 (six) hours as needed for pain.        Follow Up Appointments:     Follow-up Information    Follow up with VAN Dinah Beers, MD. (PA/LAT CXR to be taken (at [facility] which is in the same building as Dr. Zenaida Niece Trigt's office) on 04/12/2012 at 12:00 pm;Appointment with Dr. Donata Clay is on 04/12/2012 at 1:00 pm)    Contact information:   [address] [employer] [address] [place] [address] [phone]       Follow up with Cassell Smiles., MD. (Call for a follow up appointment 1-2 weeks)    Contact information:   [address] [place] [address] [phone]          Signed: ZIMMERMAN,DONIELLE MPA-C 03/22/2012, 10:37 AM

## 2012-03-22 NOTE — Progress Notes (Signed)
Chest tube d/c'd per MD order.  Pt premedicated with pain meds, pt tolerated well.  CXR ordered to follow up.  Roselie Awkward, RN

## 2012-03-22 NOTE — Progress Notes (Addendum)
                   [address]            [name] [address]          [phone]    5 Days Post-Op Procedure(s) (LRB): VIDEO ASSISTED THORACOSCOPY (Right) RESECTION OF APICAL BLEB (Right)  Subjective: Patient still with occasional nausea. She denies emesis or abdominal pain. Is passing flatus but no bowel movement yet.  Objective: Vital signs in last 24 hours: Temp:  [97.8 F (36.6 C)-98.5 F (36.9 C)] 98.1 F (36.7 C) (12/18 0800) Pulse Rate:  [85-88] 86  (12/18 0800) Cardiac Rhythm:  [-] Normal sinus rhythm (12/18 0800) Resp:  [11-24] 24  (12/18 0800) BP: (116-133)/(54-78) 133/68 mmHg (12/18 0800) SpO2:  [91 %-96 %] 94 % (12/18 0800) Weight:  [61.2 kg (134 lb 14.7 oz)] 61.2 kg (134 lb 14.7 oz) (12/18 0443)     Intake/Output from previous day: 12/17 0701 - 12/18 0700 In: -  Out: 695 [Urine:525; Chest Tube:170]   Physical Exam:  Cardiovascular: RRR Pulmonary: Mostly clear on left;slightly diminished at right base; no rales, wheezes, or rhonchi. Abdomen: Soft, non tender, bowel sounds present. Extremities: No lower extremity edema. Wounds: Clean and dry.  No erythema or signs of infection. Chest Tube: no air leak  Lab Results: CBC: Basename 03/22/12 0500 03/21/12 0300  WBC 6.5 6.9  HGB 8.2* 8.0*  HCT 24.8* 24.4*  PLT 345 300   BMET:  Basename 03/21/12 0300 03/20/12 0430  NA 127* 131*  K 4.3 4.5  CL 90* 94*  CO2 31 32  GLUCOSE 93 98  BUN 13 12  CREATININE 0.81 0.85  CALCIUM 8.3* 8.0*    PT/INR: No results found for this basename: LABPROT,INR in the last 72 hours ABG:  INR: Will add last result for INR, ABG once components are confirmed Will add last 4 CBG results once components are confirmed  Assessment/Plan:  1. CV - SR 2.  Pulmonary - Chest tube with 170 cc of output. It is on water seal. CXR this am shows tiny right apical pneumothorax, and stable right lateral wall subcutaneous emphysema. Will remove remaining chest tube.  Wean off O2 if tolerates. She is currently at 3L via [place].Pathology results showed invasive adenocarcinoma of RUL wedge, tumor invades visceral pleural and lymph/vascular invasion seen. Second margin of RUL wedge showed no tumor. Above NOT discussed with patient as Dr. Donata Clay will make aware. 3.ABL anemia- H and H stable at 8.2 and 24.8 4.LOC constipation 5.Regarding nausea, could be secondary to Trinsicon. Will stop. 6.Hopefully, home soon  ZIMMERMAN,DONIELLE MPA-C 03/22/2012,9:49 AM  patient examined and medical record reviewed,agree with above note. [facility] when her sats are stable on 2-3 L/min O2 VAN TRIGT III,Maigen Mozingo 03/22/2012

## 2012-03-23 ENCOUNTER — Inpatient Hospital Stay (HOSPITAL_COMMUNITY): Payer: Medicare HMO

## 2012-03-23 MED ORDER — FLUCONAZOLE 100 MG PO TABS
100.0000 mg | ORAL_TABLET | Freq: Every day | ORAL | Status: DC
  Administered 2012-03-23 – 2012-03-24 (×2): 100 mg via ORAL
  Filled 2012-03-23 (×2): qty 1

## 2012-03-23 MED ORDER — POLYETHYLENE GLYCOL 3350 17 G PO PACK
17.0000 g | PACK | Freq: Once | ORAL | Status: AC
  Administered 2012-03-23: 17 g via ORAL
  Filled 2012-03-23: qty 1

## 2012-03-23 MED ORDER — SORBITOL 70 % SOLN
460.0000 mL | TOPICAL_OIL | Freq: Once | ORAL | Status: AC
  Administered 2012-03-23: 460 mL via RECTAL
  Filled 2012-03-23: qty 115

## 2012-03-23 NOTE — Discharge Instructions (Signed)
ACTIVITY:  1.Increase activity slowly. 2.Walk daily and increase frequency and duration as tolerates. 3.May walk up steps. 4.No lifting more than ten pounds for two weeks. 5.No driving for two weeks. 6.Avoid straining. 7.STOP any activity that causes chest pain, shortness of breath, dizziness,sweating,     or excessive weakness. 8.Continue with breathing exercises daily.  DIET:  Low salt  diet   WOUND:  1.May shower. 2.Clean wounds with mild soap and water.  Call the office at [phone] if any problems arise.   Thoracotomy Care After Refer to this sheet in the next few weeks. These instructions provide you with information on caring for yourself after your procedure. Your caregiver may also give you more specific instructions. Your treatment has been planned according to current medical practices, but problems sometimes occur. Call your caregiver if you have any problems or questions after your procedure. HOME CARE INSTRUCTIONS  Remove the bandage (dressing) over your chest tube site as directed by your caregiver.  It is normal to be sore for a couple weeks following surgery. See your caregiver if this seems to be getting worse rather than better.  Only take over-the-counter or prescription medicines for pain, discomfort, or fever as directed by your caregiver. It is very important to take pain medicine when you need it so that you will cough and breathe deeply enough to clear mucus (phlegm) and expand your lungs. This helps prevent a lung infection (pneumonia).  If it hurts to cough, hold a pillow against your chest when you cough. This may help with the discomfort. In spite of the discomfort, cough frequently.  Taking deep breaths keeps lungs inflated and protects against pneumonia. Most patients will go home with a device called an incentive spirometer that encourages deep breathing.  You may resume a normal diet and activities as directed.  Use showers for bathing until  you see your caregiver, or as instructed.  Change dressings if necessary or as directed.  Avoid lifting or driving until you are instructed otherwise.  Make an appointment to see your caregiver for stitch (suture) or staple removal when instructed.  Do not travel by airplane for 2 weeks after the chest tube is removed. SEEK MEDICAL CARE IF:  You are bleeding from your wounds.  Your heartbeat seems irregular.  You have redness, swelling, or increasing pain in the wounds.  There is pus coming from your wounds.  There is a bad smell coming from the wound or dressing. SEEK IMMEDIATE MEDICAL CARE IF:  You have a fever.  You develop a rash.  You have difficulty breathing.  You develop any reaction or side effects to medicines given.  You develop lightheadedness or feel faint.  You develop shortness of breath or chest pain. MAKE SURE YOU:  Understand these instructions.  Will watch your condition.  Will get help right away if you are not doing well or get worse. Document Released: 09/04/2010 Document Revised: 06/14/2011 Document Reviewed: 09/04/2010 [facility] Patient Information 2013 [place], [place].    Home Health RN and PT arranged with [facility]- [phone]

## 2012-03-23 NOTE — Progress Notes (Signed)
   CARE MANAGEMENT NOTE 03/23/2012  Patient:  [name], [name]   Account Number:  [id number]  Date Initiated:  03/08/2012  Documentation initiated by:  Donn Pierini  Subjective/Objective Assessment:   Pt admitted from [facility] with pneumothorax and chest tube- for OR today for VATS     Action/Plan:   PTA pt lived at home with home O2   Anticipated DC Date:  03/16/2012   Anticipated DC Plan:  HOME W HOME HEALTH SERVICES      DC Planning Services  CM consult      [facility] Choice  Resumption Of Svcs/PTA Provider  HOME HEALTH   Choice offered to / List presented to:  C-1 Patient        HH arranged  HH-1 RN  HH-2 PT      [facility] agency  [facility]   Status of service:  Completed, signed off Medicare Important Message given?   (If response is "NO", the following Medicare IM given date fields will be blank) Date Medicare IM given:   Date Additional Medicare IM given:    Discharge Disposition:  HOME W HOME HEALTH SERVICES  Per UR Regulation:  Reviewed for med. necessity/level of care/duration of stay  If discussed at Long Length of Stay Meetings, dates discussed:   03/14/2012  03/23/2012    Comments:  03/23/12- 1030- Kyannah Climer RN, BSN [phone] Pt with HH orders for possible d/c tomorrow- spoke with pt at bedside- per conversation pt states that her daughter wants her to use a different HH agency- pt in agreement and states that she does not want to continue services with [facility] but wants to use [facility] for services. Call made to [facility] with [facility] for discontinuation of [facility] services and referral made to Lupita Leash with [facility] for [facility] services at discharge. Pt states that she has needed DME- other than a shower bench that she wants to wait on for the HH-PT to assess in home. HH services to begin within 24-48 hr. post discharge.  03-20-12 [place] - [phone] Talked with patient and daughter at bedside.  12-13 - post op VATS.  Plans to go home to daughters,  Has  RW, will need tub seat.  Has had [facility] in past but would like to change to [facility].  Have already checked with insurance and they say this is in network and ok to use. CM will continue to follow for discharge needs.  03/15/11- 1215- Donn Pierini RN, BSN [phone] Pt active with [facility] for HH-RN- per PT recommending HH-PT- pt will need resumption orders for [facility]- with the addition of HH-PT- please order HH prior to discharge. NCM to continue to follow  03/13/12- 1525- Donn Pierini RN, BSN [phone] Pt still with Chest tube- has subQ air- PT eval ordered- recommending HH- NCM to cont. to follow pt progression - anticipate home with [facility] when stable.  03/08/12- 1100- Donn Pierini RN, BSN [phone] Pt to OR today for chest tube placement ? need for VATS if chest tube does not resolve pntx- NCM to follow post op for pt progression and d/c needs.

## 2012-03-23 NOTE — Progress Notes (Addendum)
                   [address]            [place] [address]          [phone]    6 Days Post-Op Procedure(s) (LRB): VIDEO ASSISTED THORACOSCOPY (Right) RESECTION OF APICAL BLEB (Right)  Subjective: Patient still with occasional nausea. Has not had bowel movement yet, but is passing flatus. She is receiving a breathing treatment this am  Objective: Vital signs in last 24 hours: Temp:  [98 F (36.7 C)-98.6 F (37 C)] 98 F (36.7 C) (12/19 0400) Pulse Rate:  [80-93] 80  (12/19 0400) Cardiac Rhythm:  [-] Normal sinus rhythm (12/19 0400) Resp:  [16-26] 21  (12/19 0400) BP: (96-133)/(45-68) 105/45 mmHg (12/19 0400) SpO2:  [94 %-96 %] 94 % (12/19 0400) Weight:  [58.9 kg (129 lb 13.6 oz)] 58.9 kg (129 lb 13.6 oz) (12/19 0400)     Intake/Output from previous day: 12/18 0701 - 12/19 0700 In: 1202 [P.O.:1200; IV Piggyback:2] Out: 1200 [Urine:1200]   Physical Exam:  Cardiovascular: RRR Pulmonary: Mostly clear on left;slightly diminished at right base; no rales, wheezes, or rhonchi. Abdomen: Soft, non tender, bowel sounds present. Extremities: No lower extremity edema. Wounds: Dressing is mostly clean and dry.  No erythema or signs of infection.   Lab Results: CBC:  Basename 03/22/12 0500 03/21/12 0300  WBC 6.5 6.9  HGB 8.2* 8.0*  HCT 24.8* 24.4*  PLT 345 300   BMET:   Basename 03/21/12 0300  NA 127*  K 4.3  CL 90*  CO2 31  GLUCOSE 93  BUN 13  CREATININE 0.81  CALCIUM 8.3*    PT/INR: No results found for this basename: LABPROT,INR in the last 72 hours ABG:  INR: Will add last result for INR, ABG once components are confirmed Will add last 4 CBG results once components are confirmed  Assessment/Plan:  1. CV - SR 2.  Pulmonary - Chest tube removed yesterday. CXR this am appear stable ( no pneumothorax, right lung scarring with bibasilar atelectasis.) She is currently on 3 liters via [place]. She was on oxygen at home. She did have  desaturation with exertion into the 70's but now in the 80's. 3.ABL anemia- H and H stable at 8.2 and 24.8 4.LOC constipation - will give Miralax at patient's request 5.Regarding nausea, Trinsicon stopped yesterday. Likely secondary to constipation 6.Remove central line 7.Hopefully, home in am if oxygen saturation stable  ZIMMERMAN,DONIELLE MPA-C 03/23/2012,7:46 AM   patient examined and medical record reviewed,agree with above note. VAN TRIGT III,Stefanie Hodgens 03/23/2012

## 2012-03-24 ENCOUNTER — Inpatient Hospital Stay (HOSPITAL_COMMUNITY): Payer: Medicare HMO

## 2012-03-24 NOTE — Progress Notes (Signed)
Physical Therapy Treatment Patient Details Name: [name] MRN: [id number] DOB: Jan 20, 1941 Today's Date: 03/24/2012 Time: 4098-1191 PT Time Calculation (min): 23 min  PT Assessment / Plan / Recommendation Comments on Treatment Session  Pt dressed ready to d/c home.  Reeducated on energy conservation techniques at home.  Also discussed the need for rollater to conserve energy with ambulation and long distance as well as have somewhere to sit if needed and place O2 tank to be more independent.  Pt continues to need intermittent rest breaks due to increase SOB but able to complete stairs.  Called CM about the need for rollater however family ready to d/c home and has RW at home to use.  Family to discuss with HHPT on rollater.     Follow Up Recommendations  Home health PT;Supervision - Intermittent     Does the patient have the potential to tolerate intense rehabilitation     Barriers to Discharge        Equipment Recommendations   (rollater)    Recommendations for Other Services    Frequency Min 3X/week   Plan Discharge plan remains appropriate;Frequency remains appropriate    Precautions / Restrictions     Pertinent Vitals/Pain No c/o pain;    Mobility  Bed Mobility Bed Mobility: Not assessed Details for Bed Mobility Assistance:  (Pt sitting EOB when entering room) Transfers Transfers: Sit to Stand;Stand to Sit Sit to Stand: 6: Modified independent (Device/Increase time);From bed Stand to Sit: 6: Modified independent (Device/Increase time);To bed Ambulation/Gait Ambulation/Gait Assistance: 5: Supervision Ambulation Distance (Feet): 200 Feet Assistive device: Rolling walker Ambulation/Gait Assistance Details: Supervision for safety with cues to take intermittent rest breaks when needed. Gait Pattern: Step-through pattern;Shuffle;Trunk flexed Stairs: Yes Stairs Assistance: 4: Min assist Stairs Assistance Details (indicate cue type and reason): (A) with HHA for safety with  cues for step sequence Stair Management Technique: One rail Right;Forwards Number of Stairs: 2     Exercises     PT Diagnosis:    PT Problem List:   PT Treatment Interventions:     PT Goals Acute Rehab PT Goals PT Goal Formulation: With patient Time For Goal Achievement: 03/24/12 Potential to Achieve Goals: Good Pt will go Sit to Stand: Independently PT Goal: Sit to Stand - Progress: Progressing toward goal Pt will go Stand to Sit: Independently PT Goal: Stand to Sit - Progress: Progressing toward goal Pt will Ambulate: 51 - 150 feet;with modified independence;with rolling walker PT Goal: Ambulate - Progress: Progressing toward goal Pt will Go Up / Down Stairs: 3-5 stairs;with supervision;with least restrictive assistive device PT Goal: Up/Down Stairs - Progress: Progressing toward goal  Visit Information  Last PT Received On: 03/24/12 Assistance Needed: +1    Subjective Data  Subjective: "I'm ready to go home."   Cognition  Overall Cognitive Status: Appears within functional limits for tasks assessed/performed Arousal/Alertness: Awake/alert Orientation Level: Appears intact for tasks assessed Behavior During Session: [facility] for tasks performed    Balance     End of Session PT - End of Session Equipment Utilized During Treatment: Gait belt;Oxygen (2L) Activity Tolerance: Patient tolerated treatment well Patient left: in bed;with call bell/phone within reach;with family/visitor present (Pt sitting EOB ready to d/c home) Nurse Communication: Mobility status   GP     [name] 03/24/2012, 2:52 PM Jake Shark, PT DPT [phone]

## 2012-03-24 NOTE — Progress Notes (Signed)
                    [address]            [place] [address]          [phone]     7 Days Post-Op Procedure(s) (LRB): VIDEO ASSISTED THORACOSCOPY (Right) RESECTION OF APICAL BLEB (Right)  Subjective: Feels well, breathing stable.  Nausea better and ate better last night. + small BM.  Wants to go home.   Objective: Vital signs in last 24 hours: Patient Vitals for the past 24 hrs:  BP Temp Temp src Pulse Resp SpO2 Weight  03/24/12 0317 100/51 mmHg 98.7 F (37.1 C) Oral 78  24  98 % 137 lb 5.6 oz (62.3 kg)  03/24/12 0000 92/46 mmHg 98.7 F (37.1 C) Oral 76  22  95 % -  03/23/12 2159 - - - - - 97 % -  03/23/12 1955 124/45 mmHg 97.8 F (36.6 C) Oral 81  22  100 % -  03/23/12 1600 127/56 mmHg 97.9 F (36.6 C) Oral 85  24  99 % -  03/23/12 1542 - - - - - 98 % -  03/23/12 1216 108/93 mmHg 98.9 F (37.2 C) Oral 83  21  97 % -   Current Weight  03/24/12 137 lb 5.6 oz (62.3 kg)     Intake/Output from previous day: 12/19 0701 - 12/20 0700 In: 1080 [P.O.:1080] Out: 600 [Urine:600]    PHYSICAL EXAM:  Heart: RRR Lungs: Clear, slightly decreased BS on R Wound:Clean and dry at CT site    Lab Results: CBC: Basename 03/22/12 0500  WBC 6.5  HGB 8.2*  HCT 24.8*  PLT 345   BMET: No results found for this basename: NA:2,K:2,CL:2,CO2:2,GLUCOSE:2,BUN:2,CREATININE:2,CALCIUM:2 in the last 72 hours  PT/INR: No results found for this basename: LABPROT,INR in the last 72 hours    CXR: Findings: Trachea is grossly midline. Heart size stable. Thoracic  aorta is somewhat tortuous. There are postoperative changes in the  upper right hemithorax with associated volume loss, scarring and  underlying emphysema. Posterior segment right upper lobe nodule  seen on 03/14/2012 is not readily appreciated. Small right pleural  effusion and/or thickening. Tiny left pleural effusion. Small  subcutaneous emphysema along the right axilla.  IMPRESSION:  1. Postoperative  changes in the upper right hemithorax with  associated scarring and volume loss. No definite pneumothorax.  2. Small right pleural effusion and/or pleural thickening. Tiny  left pleural effusion.  3. Posterior segment right upper lobe nodule, seen on 03/14/2012,  is not readily appreciated.    Assessment/Plan: S/P Procedure(s) (LRB): VIDEO ASSISTED THORACOSCOPY (Right) RESECTION OF APICAL BLEB (Right) Sats stable on 3L, otherwise doing well. Home today-instructions reviewed with patient.   LOS: 17 days    Mercedes Valeriano H 03/24/2012

## 2012-03-24 NOTE — Progress Notes (Signed)
Pt d/c home per MD order, pt VSS, family at [facility], d/c instructions given, all questions answered

## 2012-03-24 NOTE — Discharge Summary (Signed)
patient examined and medical record reviewed,agree with above note. VAN TRIGT III,Silverio Hagan 03/24/2012

## 2012-03-25 ENCOUNTER — Emergency Department (HOSPITAL_COMMUNITY)
Admission: EM | Admit: 2012-03-25 | Discharge: 2012-03-25 | Disposition: A | Discharge status: Home / Self Care | Payer: Medicare HMO | Attending: Emergency Medicine | Admitting: Emergency Medicine

## 2012-03-25 ENCOUNTER — Emergency Department (HOSPITAL_COMMUNITY): Payer: Medicare HMO

## 2012-03-25 ENCOUNTER — Encounter (HOSPITAL_COMMUNITY): Payer: Self-pay | Admitting: Physical Medicine and Rehabilitation

## 2012-03-25 DIAGNOSIS — Z79899 Other long term (current) drug therapy: Secondary | ICD-10-CM | POA: Insufficient documentation

## 2012-03-25 DIAGNOSIS — R0602 Shortness of breath: Secondary | ICD-10-CM | POA: Insufficient documentation

## 2012-03-25 DIAGNOSIS — D510 Vitamin B12 deficiency anemia due to intrinsic factor deficiency: Secondary | ICD-10-CM | POA: Insufficient documentation

## 2012-03-25 DIAGNOSIS — Z87891 Personal history of nicotine dependence: Secondary | ICD-10-CM | POA: Insufficient documentation

## 2012-03-25 DIAGNOSIS — Z9889 Other specified postprocedural states: Secondary | ICD-10-CM | POA: Insufficient documentation

## 2012-03-25 DIAGNOSIS — J449 Chronic obstructive pulmonary disease, unspecified: Secondary | ICD-10-CM | POA: Insufficient documentation

## 2012-03-25 DIAGNOSIS — I10 Essential (primary) hypertension: Secondary | ICD-10-CM | POA: Insufficient documentation

## 2012-03-25 DIAGNOSIS — J4489 Other specified chronic obstructive pulmonary disease: Secondary | ICD-10-CM | POA: Insufficient documentation

## 2012-03-25 DIAGNOSIS — IMO0002 Reserved for concepts with insufficient information to code with codable children: Secondary | ICD-10-CM | POA: Insufficient documentation

## 2012-03-25 DIAGNOSIS — M7989 Other specified soft tissue disorders: Secondary | ICD-10-CM | POA: Insufficient documentation

## 2012-03-25 DIAGNOSIS — Z7982 Long term (current) use of aspirin: Secondary | ICD-10-CM | POA: Insufficient documentation

## 2012-03-25 DIAGNOSIS — E079 Disorder of thyroid, unspecified: Secondary | ICD-10-CM | POA: Insufficient documentation

## 2012-03-25 LAB — CBC WITH DIFFERENTIAL/PLATELET
Basophils Relative: 1 % (ref 0–1)
Eosinophils Absolute: 1 10*3/uL — ABNORMAL HIGH (ref 0.0–0.7)
HCT: 28 % — ABNORMAL LOW (ref 36.0–46.0)
Hemoglobin: 9.1 g/dL — ABNORMAL LOW (ref 12.0–15.0)
MCH: 28.5 pg (ref 26.0–34.0)
MCHC: 32.5 g/dL (ref 30.0–36.0)
MCV: 87.8 fL (ref 78.0–100.0)
Monocytes Absolute: 1 10*3/uL (ref 0.1–1.0)
Monocytes Relative: 17 % — ABNORMAL HIGH (ref 3–12)
Neutro Abs: 2.7 10*3/uL (ref 1.7–7.7)

## 2012-03-25 LAB — COMPREHENSIVE METABOLIC PANEL
Albumin: 2.4 g/dL — ABNORMAL LOW (ref 3.5–5.2)
BUN: 11 mg/dL (ref 6–23)
Chloride: 91 mEq/L — ABNORMAL LOW (ref 96–112)
Creatinine, Ser: 0.91 mg/dL (ref 0.50–1.10)
GFR calc non Af Amer: 62 mL/min — ABNORMAL LOW (ref >90)
Total Bilirubin: 0.4 mg/dL (ref 0.3–1.2)

## 2012-03-25 LAB — PRO B NATRIURETIC PEPTIDE: Pro B Natriuretic peptide (BNP): 606.3 pg/mL — ABNORMAL HIGH (ref 0–125)

## 2012-03-25 MED ORDER — SODIUM CHLORIDE 0.9 % IN NEBU
3.0000 mL | INHALATION_SOLUTION | Freq: Once | RESPIRATORY_TRACT | Status: AC
  Administered 2012-03-25: 3 mL via RESPIRATORY_TRACT

## 2012-03-25 MED ORDER — IOHEXOL 300 MG/ML  SOLN
75.0000 mL | Freq: Once | INTRAMUSCULAR | Status: AC | PRN
  Administered 2012-03-25: 75 mL via INTRAVENOUS

## 2012-03-25 NOTE — ED Notes (Signed)
Patient had episode of coughing, could not catch her breath, oxygen sat decreased to 80 percent per RT staff.  Patient resolved quickly.  She was able to tolerate her Ct scan and has returned to her room. Patient with anxiety related to cough

## 2012-03-25 NOTE — ED Notes (Signed)
patietn and family educated on d/c instructions and encouraged to return to ED as needed.  She is to follow up with MD as scheduled

## 2012-03-25 NOTE — ED Notes (Signed)
Pt returns from xray. Placed back on monitor, o2, pulse ox.

## 2012-03-25 NOTE — ED Notes (Signed)
Patient has completed the neb treatment,  She states she is feeling better.  Her voice is less hoarse.  She is continuing to perform flutter valve.  Patient aware of plans for CT of her neck.  Family remains at bedside

## 2012-03-25 NOTE — ED Notes (Signed)
Patient with continued sob,  She states she cannot get her mucous up.  Call placed to RT to discuss.  She recommends flutter.

## 2012-03-25 NOTE — ED Notes (Signed)
Pt presents to department via [facility] from home for evaluation of SOB. Was released from hospital yesterday for pneumothorax, became very short of breath with coughing this morning. Also states she feels like she is choking. 94% RA. 20g LAC. Denies pain. She is conscious alert and oriented x4.

## 2012-03-25 NOTE — ED Notes (Signed)
Rt has been to bedside.  Patient receiving neb saline.  Patient continues to be anxious.  Family at bedside.

## 2012-03-25 NOTE — ED Provider Notes (Addendum)
History     CSN: [id number]  Arrival date & time 03/25/12  1113   First MD Initiated Contact with Patient 03/25/12 1118      Chief Complaint  Patient presents with  . Shortness of Breath    (Consider location/radiation/quality/duration/timing/severity/associated sxs/prior treatment) Patient is a 71 y.o. female presenting with shortness of breath. The history is provided by the patient.  Shortness of Breath  The current episode started today. The onset was sudden. The problem occurs continuously. The problem has been rapidly improving. The problem is severe. Relieved by: Was not improved by albuterol or Advair at home but did improve with oxygen. The symptoms are aggravated by a supine position. Associated symptoms include shortness of breath. Pertinent negatives include no chest pain, no chest pressure, no fever, no cough and no wheezing. Associated symptoms comments: States that it feels like she is choking. Past medical history comments: History of COPD and recent spontaneous pneumothorax with chest tube and VATS Procedure. Urine output has been normal. Recently, medical care has been given at this facility.    Past Medical History  Diagnosis Date  . COPD (chronic obstructive pulmonary disease)   . Thyroid disease   . Pernicious anemia   . Hypertension     Past Surgical History  Procedure Date  . Abdominal hysterectomy   . Appendectomy   . Cholecystectomy   . Tubal ligation   . Chest tube insertion 03/08/2012    Procedure: CHEST TUBE INSERTION;  Surgeon: Kerin Perna, MD;  Location: [facility] OR;  Service: Thoracic;  Laterality: Right;  . Video assisted thoracoscopy 03/17/2012    Procedure: VIDEO ASSISTED THORACOSCOPY;  Surgeon: Kerin Perna, MD;  Location: [facility] OR;  Service: Thoracic;  Laterality: Right;  . Resection of apical bleb 03/17/2012    Procedure: RESECTION OF APICAL BLEB;  Surgeon: Kerin Perna, MD;  Location: [facility] OR;  Service: Thoracic;  Laterality: Right;   stapling of bleb    Family History  Problem Relation Age of Onset  . Cancer Mother   . Heart failure Brother     History  Substance Use Topics  . Smoking status: Former Smoker    Types: Cigarettes  . Smokeless tobacco: Former Neurosurgeon    Quit date: 03/07/2006  . Alcohol Use: No    OB History    Grav Para Term Preterm Abortions TAB SAB Ect Mult Living   4 4 2 2             Review of Systems  Constitutional: Negative for fever.  Respiratory: Positive for shortness of breath. Negative for cough and wheezing.   Cardiovascular: Positive for leg swelling. Negative for chest pain.  All other systems reviewed and are negative.    Allergies  Codeine and Penicillins  Home Medications   Current Outpatient Rx  Name  Route  Sig  Dispense  Refill  . ALBUTEROL SULFATE (2.5 MG/3ML) 0.083% IN NEBU   Nebulization   Take 2.5 mg by nebulization 4 (four) times daily. *May use up to every 4 hours as needed for shortness of breath*         . ALPRAZOLAM 1 MG PO TABS   Oral   Take 1 mg by mouth 3 (three) times daily as needed. For anxiety and/or sleep         . ASPIRIN 81 MG PO CHEW   Oral   Chew 81 mg by mouth every morning.         Marland Kitchen CYANOCOBALAMIN 1000 MCG/ML  IJ SOLN   Intramuscular   Inject 1,000 mcg into the muscle every 30 (thirty) days.         Marland Kitchen FLUTICASONE-SALMETEROL 250-50 MCG/DOSE IN AEPB   Inhalation   Inhale 1 puff into the lungs every 12 (twelve) hours.         . GUAIFENESIN ER 600 MG PO TB12   Oral   Take 600 mg by mouth every morning.          . IBUPROFEN 200 MG PO TABS   Oral   Take 600 mg by mouth every 6 (six) hours as needed. For pain         . LEVOTHYROXINE SODIUM 88 MCG PO TABS   Oral   Take 88 mcg by mouth daily before breakfast. BRAND MEDICALLY NECESSARY         . LORATADINE 10 MG PO TABS   Oral   Take 10 mg by mouth every morning.         . ADULT MULTIVITAMIN W/MINERALS CH   Oral   Take 1 tablet by mouth daily.         .  NON FORMULARY      OXYGEN      24/7         . POTASSIUM CHLORIDE CRYS ER 20 MEQ PO TBCR   Oral   Take 20 mEq by mouth daily.         Marland Kitchen TIOTROPIUM BROMIDE MONOHYDRATE 18 MCG IN CAPS   Inhalation   Place 1 capsule (18 mcg total) into inhaler and inhale daily.   30 capsule   1   . TRAMADOL HCL 50 MG PO TABS   Oral   Take 1-2 tablets (50-100 mg total) by mouth every 6 (six) hours as needed for pain.   30 tablet   0     BP 170/76  Pulse 97  Temp 98.2 F (36.8 C) (Oral)  Resp 22  SpO2 100%  Physical Exam  Nursing note and vitals reviewed. Constitutional: She is oriented to person, place, and time. She appears well-developed and well-nourished. No distress.  HENT:  Head: Normocephalic and atraumatic.  Mouth/Throat: Oropharynx is clear and moist.  Eyes: Conjunctivae normal and EOM are normal. Pupils are equal, round, and reactive to light.  Neck: Normal range of motion. Neck supple.  Cardiovascular: Normal rate, regular rhythm and intact distal pulses.   No murmur heard. Pulmonary/Chest: Effort normal. Not tachypneic. No respiratory distress. She has decreased breath sounds in the right upper field and the right middle field. She has no wheezes. She has no rhonchi. She has no rales.    Abdominal: Soft. She exhibits no distension. There is no tenderness. There is no rebound and no guarding.  Musculoskeletal: Normal range of motion. She exhibits edema. She exhibits no tenderness.       1+ bilateral lower ext edema  Neurological: She is alert and oriented to person, place, and time.  Skin: Skin is warm and dry. No rash noted. No erythema.  Psychiatric: She has a normal mood and affect. Her behavior is normal.    ED Course  Procedures (including critical care time)  Labs Reviewed  CBC WITH DIFFERENTIAL - Abnormal; Notable for the following:    RBC 3.19 (*)     Hemoglobin 9.1 (*)     HCT 28.0 (*)     RDW 16.2 (*)     Monocytes Relative 17 (*)     Eosinophils  Relative 19 (*)  Eosinophils Absolute 1.0 (*)     All other components within normal limits  COMPREHENSIVE METABOLIC PANEL - Abnormal; Notable for the following:    Sodium 131 (*)     Chloride 91 (*)     Glucose, Bld 136 (*)     Total Protein 5.4 (*)     Albumin 2.4 (*)     GFR calc non Af Amer 62 (*)     GFR calc Af Amer 72 (*)     All other components within normal limits  PRO B NATRIURETIC PEPTIDE - Abnormal; Notable for the following:    Pro B Natriuretic peptide (BNP) 606.3 (*)     All other components within normal limits   Dg Chest 2 View  03/25/2012  *RADIOLOGY REPORT*  Clinical Data: Very short of breath  CHEST - 2 VIEW  Comparison: Chest x-ray of 03/24/2012  Findings: The lungs are markedly hyperaerated consistent with severe COPD.  Opacity at the right base may represent pleural thickening and/or loculated effusion extending on the right hemithorax.  Mild cardiomegaly is stable.  The bones are osteopenic.  IMPRESSION: Severe COPD.  No change in blunting of the right costophrenic angle and cardiomegaly.   Original Report Authenticated By: Dwyane Dee, M.D.    Dg Chest 2 View  03/24/2012  *RADIOLOGY REPORT*  Clinical Data: Status post right VATS.  Shortness of breath.  CHEST - 2 VIEW  Comparison: 03/23/2012 and CT chest 03/14/2012.  Findings: Trachea is grossly midline.  Heart size stable.  Thoracic aorta is somewhat tortuous.  There are postoperative changes in the upper right hemithorax with associated volume loss, scarring and underlying emphysema. Posterior segment right upper lobe nodule seen on 03/14/2012 is not readily appreciated.  Small right pleural effusion and/or thickening.  Tiny left pleural effusion. Small subcutaneous emphysema along the right axilla.  IMPRESSION:  1.  Postoperative changes in the upper right hemithorax with associated scarring and volume loss.  No definite pneumothorax. 2.  Small right pleural effusion and/or pleural thickening.  Tiny left pleural  effusion. 3.  Posterior segment right upper lobe nodule, seen on 03/14/2012, is not readily appreciated.   Original Report Authenticated By: Leanna Battles, M.D.    Ct Soft Tissue Neck W Contrast  03/25/2012  *RADIOLOGY REPORT*  Clinical Data: Short of breath and stridor.  Recent chest surgery  CT NECK WITH CONTRAST  Technique:  Multidetector CT imaging of the neck was performed with intravenous contrast.  Contrast: 75mL OMNIPAQUE IOHEXOL 300 MG/ML  SOLN  Comparison: Chest CT 03/14/2012  Findings: Small right apical hydropneumothorax.  Apical emphysema is present.  Spiculated density right upper lobe measures approximate 15 x 30 mm and contains calcification.  This is incompletely evaluated on the current study.  There was a lesion in this area on the prior chest CT however it appears larger today. This could represent an area of scar or neoplasm or inflammation.  Filling defect is present in the mid trachea.  This measures approximately 10 x 15 mm and was not present previously.  This is most likely mucus in the airway.  There are  additional filling defects of above the carina compatible with mucus.  The epiglottis and larynx are normal.  Oropharynx is normal. Visualized paranasal sinuses are clear.  Parotid and submandibular glands are normal bilaterally.  No pathologic adenopathy in the neck.  Atherosclerotic aortic arch and great vessels.  There is a filling defect in the left common carotid artery compatible with atherosclerotic plaque.  This is unchanged from the prior study and appears to project into the lumen and could be an unstable plaque. Mild to moderate cervical spondylosis.  Small filling defect right jugular vein.  IMPRESSION: Small right apical hydropneumothorax related to recent surgery.  Spiculated density right upper lobe with calcification.  The prior chest CT revealed additional lesions which are worrisome for carcinoma.  Follow up head is suggested.  Filling defects in the trachea  compatible with mucus.  Atherosclerotic carotid arteries bilaterally.  Filling defect in the left common carotid artery may be an unstable plaque.  This is unchanged from the prior chest CT.  Small filling defect in the right jugular vein compatible with thrombus.   Original Report Authenticated By: Janeece Riggers, M.D.     Date: 03/25/2012  Rate: 93  Rhythm: normal sinus rhythm  QRS Axis: normal  Intervals: normal  ST/T Wave abnormalities: normal  Conduction Disutrbances:none  Narrative Interpretation:   Old EKG Reviewed: unchanged    1. SOB (shortness of breath)       MDM   Patient with abrupt onset of shortness of breath this morning despite using Advair and albuterol. She states she was unable to cough her catch her breath. Patient called EMS and on arrival she was satting 94% on room air and gasping. Patient with prior chest tube placement and removal within the last week spontaneous pneumo from a bleb from COPD. She went home from the hospital yesterday and was doing well until this morning. Patient does have bilateral lower sternum the edema which she states is worse than normal. After oxygen and sitting up patient's symptoms have improved. On exam she does not have any evidence of wheezing but does have decreased breath sounds on the right side.  Possibility for recurrent pneumothorax versus CHF due to the lower chimney edema and worse with lying down versus COPD exacerbation. Patient currently is comfortable and satting 100% nonrebreather. She is able to speak in complete sentences. CBC, CMP, BNP, chest x-ray pending  12:48 PM Labs unremarkable and CXR with no signs of PTX with the chronic persistent changes.  1:23 PM On reevaluation nonrebreather has been DC'd and patient on 2 L of oxygen. However she is stridorous on exam. She states that she feels choked and can't cough anything up. She's been using her incentive spirometer but does not help. Will get a CT of her neck to ensure  no restriction. Patient was also given a saline but to try and break up secretions she is already taking Mucinex.  3:10 PM Soft tissue neck showed mucus in the trachea but no other signs of obstruction. Patient felt much better after a saline neb.  Pt does have a spiculated mass in her lungs which has been seen prior and had wedge resection for further evaluation.  Pt has a small clot in her jugular vein however she had a recent procedure done yesterday which is most likely the cause of the clot and bandage removed today  Gwyneth Sprout, MD 03/25/12 1248  Gwyneth Sprout, MD 03/25/12 1251  Gwyneth Sprout, MD 03/25/12 1310  Gwyneth Sprout, MD 03/25/12 1323  Gwyneth Sprout, MD 03/25/12 1517  Gwyneth Sprout, MD 03/25/12 1521

## 2012-03-28 ENCOUNTER — Telehealth: Payer: Self-pay | Admitting: *Deleted

## 2012-03-28 DIAGNOSIS — B370 Candidal stomatitis: Secondary | ICD-10-CM

## 2012-03-28 MED ORDER — FIRST-DUKES MOUTHWASH MT SUSP: 5.0000 mL | Freq: Three times a day (TID) | OROMUCOSAL | Status: DC

## 2012-03-28 NOTE — Telephone Encounter (Signed)
[facility] nurse called stating that [name] was experiencing mouth soreness, lips are red and soreness with swallowing.  Requested magic mouthwash for her.  This was ordered.

## 2012-03-31 ENCOUNTER — Telehealth: Payer: Self-pay | Admitting: *Deleted

## 2012-03-31 NOTE — Telephone Encounter (Signed)
FISH STUDY = ALK gene rearrangement NOT detected.  Results given to Dr Donnald Garre to review.  SLJ

## 2012-04-06 ENCOUNTER — Encounter: Payer: Self-pay | Admitting: *Deleted

## 2012-04-06 NOTE — Telephone Encounter (Addendum)
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  Pt has not been seen by Dr Donnald Garre at [facility] or in the hospital, will determine whether pt needs to be seen by Dr Donnald Garre.  Most recent hospitalization given to Dr Donnald Garre to review.     Per Dr Donnald Garre, Annabelle Harman will contact Dr Donata Clay and let him know about EGFR and will see pt if needed in the future.  SLJ

## 2012-04-06 NOTE — Progress Notes (Signed)
Sent Dr. Maren Beach email regarding + EGFR

## 2012-04-10 ENCOUNTER — Other Ambulatory Visit: Payer: Self-pay | Admitting: *Deleted

## 2012-04-10 ENCOUNTER — Other Ambulatory Visit (HOSPITAL_COMMUNITY): Payer: Self-pay | Admitting: Internal Medicine

## 2012-04-10 DIAGNOSIS — J439 Emphysema, unspecified: Secondary | ICD-10-CM

## 2012-04-10 DIAGNOSIS — R1310 Dysphagia, unspecified: Secondary | ICD-10-CM

## 2012-04-12 ENCOUNTER — Ambulatory Visit (INDEPENDENT_AMBULATORY_CARE_PROVIDER_SITE_OTHER): Payer: Self-pay | Admitting: Cardiothoracic Surgery

## 2012-04-12 ENCOUNTER — Other Ambulatory Visit (HOSPITAL_COMMUNITY): Payer: Medicare HMO

## 2012-04-12 ENCOUNTER — Ambulatory Visit
Admission: RE | Admit: 2012-04-12 | Discharge: 2012-04-12 | Disposition: A | Discharge status: Home / Self Care | Payer: Medicare HMO | Source: Ambulatory Visit | Attending: Cardiothoracic Surgery | Admitting: Cardiothoracic Surgery

## 2012-04-12 ENCOUNTER — Encounter: Payer: Self-pay | Admitting: Cardiothoracic Surgery

## 2012-04-12 VITALS — BP 125/75 | HR 90 | Resp 16 | Ht 63.0 in | Wt 116.0 lb

## 2012-04-12 DIAGNOSIS — J439 Emphysema, unspecified: Secondary | ICD-10-CM

## 2012-04-12 DIAGNOSIS — R911 Solitary pulmonary nodule: Secondary | ICD-10-CM

## 2012-04-12 DIAGNOSIS — J9383 Other pneumothorax: Secondary | ICD-10-CM

## 2012-04-12 DIAGNOSIS — J939 Pneumothorax, unspecified: Secondary | ICD-10-CM

## 2012-04-12 DIAGNOSIS — Z09 Encounter for follow-up examination after completed treatment for conditions other than malignant neoplasm: Secondary | ICD-10-CM

## 2012-04-12 NOTE — Progress Notes (Signed)
PCP is Cassell Smiles., MD Referring Provider is Dalia Heading, MD  Chief Complaint  Patient presents with  . 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 Severe COPD on home oxygen, maintaining saturation 95% on 3 L Patient complains of postop course was, postoperative mildly productive cough Surgical incisions healing well Pain control with tramadol   Past Medical History  Diagnosis Date  . COPD (chronic obstructive pulmonary disease)   . Thyroid disease   . Pernicious anemia   . Hypertension     Past Surgical History  Procedure Date  . Abdominal hysterectomy   . Appendectomy   . Cholecystectomy   . Tubal ligation   . Chest tube insertion 03/08/2012    Procedure: CHEST TUBE INSERTION;  Surgeon: Kerin Perna, MD;  Location: [facility] OR;  Service: Thoracic;  Laterality: Right;  . Video assisted thoracoscopy 03/17/2012    Procedure: VIDEO ASSISTED THORACOSCOPY;  Surgeon: Kerin Perna, MD;  Location: [facility] OR;  Service: Thoracic;  Laterality: Right;  . Resection of apical bleb 03/17/2012    Procedure: RESECTION OF APICAL BLEB;  Surgeon: Kerin Perna, MD;  Location: [facility] OR;  Service: Thoracic;  Laterality: Right;  stapling of bleb    Family History  Problem Relation Age of Onset  . Cancer Mother   . Heart failure Brother     Social History History  Substance Use Topics  . Smoking status: Former Smoker    Types: Cigarettes  . Smokeless tobacco: Former Neurosurgeon    Quit date: 03/07/2006  . Alcohol Use: No    Current Outpatient Prescriptions  Medication Sig Dispense Refill  . albuterol (PROVENTIL) (2.5 MG/3ML) 0.083% nebulizer solution Take 2.5 mg by nebulization 4 (four) times daily. *May use  up to every 4 hours as needed for shortness of breath*      . ALPRAZolam (XANAX) 1 MG tablet Take 1 mg by mouth 3 (three) times daily as needed. For anxiety and/or sleep      . aspirin 81 MG chewable tablet Chew 81 mg by mouth every morning.      . cyanocobalamin (,VITAMIN B-12,) 1000 MCG/ML injection Inject 1,000 mcg into the muscle every 30 (thirty) days.      . Diphenhyd-Hydrocort-Nystatin (FIRST-DUKES MOUTHWASH) SUSP Use as directed 5 mLs in the mouth or throat 3 (three) times daily.  120 mL  1  . Fluticasone-Salmeterol (ADVAIR) 250-50 MCG/DOSE AEPB Inhale 1 puff into the lungs every 12 (twelve) hours.      Marland Kitchen guaiFENesin (MUCINEX) 600 MG 12 hr tablet Take 600 mg by mouth every morning.       Marland Kitchen ibuprofen (ADVIL,MOTRIN) 200 MG tablet Take 600 mg by mouth every 6 (six) hours as needed. For pain      . levothyroxine (SYNTHROID, LEVOTHROID) 88 MCG tablet Take 100 mcg by mouth daily before breakfast. BRAND MEDICALLY NECESSARY      . loratadine (CLARITIN) 10 MG tablet Take 10 mg by mouth every morning.      . Multiple Vitamin (MULTIVITAMIN WITH MINERALS) TABS Take 1 tablet by mouth daily.      . NON FORMULARY OXYGEN      24/7      . potassium chloride SA (K-DUR,KLOR-CON) 20 MEQ tablet  Take 20 mEq by mouth daily.      . predniSONE (STERAPRED UNI-PAK) 5 MG TABS Take by mouth.      . theophylline (THEO-24) 200 MG 24 hr capsule Take 200 mg by mouth daily.      Marland Kitchen tiotropium (SPIRIVA) 18 MCG inhalation capsule Place 1 capsule (18 mcg total) into inhaler and inhale daily.  30 capsule  1  . traMADol (ULTRAM) 50 MG tablet Take 1-2 tablets (50-100 mg total) by mouth every 6 (six) hours as needed for pain.  30 tablet  0    Allergies  Allergen Reactions  . Codeine Nausea And Vomiting    Projectile vomiting  . 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

## 2012-04-14 ENCOUNTER — Ambulatory Visit (HOSPITAL_COMMUNITY)
Admission: RE | Admit: 2012-04-14 | Discharge: 2012-04-14 | Disposition: A | Discharge status: Home / Self Care | Payer: Medicare HMO | Source: Ambulatory Visit | Attending: Internal Medicine | Admitting: Internal Medicine

## 2012-04-14 DIAGNOSIS — R1310 Dysphagia, unspecified: Secondary | ICD-10-CM

## 2012-04-14 DIAGNOSIS — K224 Dyskinesia of esophagus: Secondary | ICD-10-CM | POA: Insufficient documentation

## 2012-05-10 ENCOUNTER — Encounter: Payer: Self-pay | Admitting: Cardiothoracic Surgery

## 2012-05-10 ENCOUNTER — Ambulatory Visit (INDEPENDENT_AMBULATORY_CARE_PROVIDER_SITE_OTHER): Payer: Self-pay | Admitting: Cardiothoracic Surgery

## 2012-05-10 ENCOUNTER — Other Ambulatory Visit: Payer: Self-pay | Admitting: *Deleted

## 2012-05-10 ENCOUNTER — Ambulatory Visit
Admission: RE | Admit: 2012-05-10 | Discharge: 2012-05-10 | Disposition: A | Discharge status: Home / Self Care | Payer: Medicare HMO | Source: Ambulatory Visit | Attending: Cardiothoracic Surgery | Admitting: Cardiothoracic Surgery

## 2012-05-10 VITALS — BP 147/86 | HR 88 | Resp 20 | Ht 63.0 in | Wt 116.0 lb

## 2012-05-10 DIAGNOSIS — Z09 Encounter for follow-up examination after completed treatment for conditions other than malignant neoplasm: Secondary | ICD-10-CM

## 2012-05-10 DIAGNOSIS — D381 Neoplasm of uncertain behavior of trachea, bronchus and lung: Secondary | ICD-10-CM

## 2012-05-10 DIAGNOSIS — C3411 Malignant neoplasm of upper lobe, right bronchus or lung: Secondary | ICD-10-CM | POA: Insufficient documentation

## 2012-05-10 DIAGNOSIS — R911 Solitary pulmonary nodule: Secondary | ICD-10-CM

## 2012-05-10 DIAGNOSIS — J9383 Other pneumothorax: Secondary | ICD-10-CM

## 2012-05-10 DIAGNOSIS — C3410 Malignant neoplasm of upper lobe, unspecified bronchus or lung: Secondary | ICD-10-CM

## 2012-05-10 DIAGNOSIS — J939 Pneumothorax, unspecified: Secondary | ICD-10-CM

## 2012-05-10 HISTORY — DX: Malignant neoplasm of upper lobe, right bronchus or lung: C34.11

## 2012-05-10 NOTE — Progress Notes (Signed)
PCP is Cassell Smiles., MD Referring Provider is Dalia Heading, MD  Chief Complaint  Patient presents with  . Routine Post Op    1 month f/u with CXR,  surveillance of a second smaller nodule in the right upper lobe    . 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 A second nodular density deep in the right upper lobe was not removed as it would require a lobectomy the patient would not tolerate it to her poor PFTs and long-standing home O2 therapy She is recovered well and progressed well at home. She is on 3 L nasal cannula and will decrease that to 2 L to maintain sats greater than 94%. She is walking. Her incision is healed well. She's had minimal postthoracotomy pain. Her chest x-ray shows no effusion or pneumothorax postop period the residual nodular density in the right upper lobe is not visible on chest x-ray. This will be followed with serial CT scans starting 6 months postop.  Past Medical History  Diagnosis Date  . COPD (chronic obstructive pulmonary disease)   . Thyroid disease   . Pernicious anemia   . Hypertension     Past Surgical History  Procedure Date  . Abdominal hysterectomy   . Appendectomy   . Cholecystectomy   . Tubal ligation   . Chest tube insertion 03/08/2012    Procedure: CHEST TUBE INSERTION;  Surgeon: Kerin Perna, MD;  Location: [facility] OR;  Service: Thoracic;  Laterality: Right;  . Video assisted thoracoscopy 03/17/2012    Procedure: VIDEO ASSISTED THORACOSCOPY;  Surgeon: Kerin Perna, MD;  Location: [facility] OR;  Service: Thoracic;  Laterality: Right;  . Resection of apical bleb 03/17/2012    Procedure: RESECTION OF APICAL BLEB;  Surgeon: Kerin Perna, MD;  Location: [facility] OR;  Service: Thoracic;  Laterality: Right;   stapling of bleb    Family History  Problem Relation Age of Onset  . Cancer Mother   . Heart failure Brother     Social History History  Substance Use Topics  . Smoking status: Former Smoker    Types: Cigarettes  . Smokeless tobacco: Former Neurosurgeon    Quit date: 03/07/2006  . Alcohol Use: No    Current Outpatient Prescriptions  Medication Sig Dispense Refill  . albuterol (PROVENTIL) (2.5 MG/3ML) 0.083% nebulizer solution Take 2.5 mg by nebulization 4 (four) times daily. *May use up to every 4 hours as needed for shortness of breath*      . ALPRAZolam (XANAX) 1 MG tablet Take 1 mg by mouth 3 (three) times daily as needed. For anxiety and/or sleep      . aspirin 81 MG chewable tablet Chew 81 mg by mouth every morning.      . cyanocobalamin (,VITAMIN B-12,) 1000 MCG/ML injection Inject 1,000 mcg into the muscle every 30 (thirty) days.      . Fluticasone-Salmeterol (ADVAIR) 250-50 MCG/DOSE AEPB Inhale 1 puff into the lungs every 12 (twelve) hours.      Marland Kitchen guaiFENesin (MUCINEX) 600 MG 12 hr tablet Take 600 mg by mouth every morning.       . hydrochlorothiazide (HYDRODIURIL) 25 MG tablet Take 25 mg by mouth daily.       Marland Kitchen ibuprofen (ADVIL,MOTRIN) 200 MG  tablet Take 600 mg by mouth every 6 (six) hours as needed. For pain      . levothyroxine (SYNTHROID, LEVOTHROID) 88 MCG tablet Take 100 mcg by mouth daily before breakfast. BRAND MEDICALLY NECESSARY      . loratadine (CLARITIN) 10 MG tablet Take 10 mg by mouth every morning.      . Multiple Vitamin (MULTIVITAMIN WITH MINERALS) TABS Take 1 tablet by mouth daily.      . NON FORMULARY OXYGEN      24/7      . potassium chloride SA (K-DUR,KLOR-CON) 20 MEQ tablet Take 20 mEq by mouth daily.      . theophylline (THEO-24) 200 MG 24 hr capsule Take 200 mg by mouth daily.      Marland Kitchen tiotropium (SPIRIVA) 18 MCG inhalation capsule Place 1 capsule (18 mcg total) into inhaler and inhale daily.  30 capsule  1  . traMADol (ULTRAM) 50 MG tablet Take 1-2 tablets  (50-100 mg total) by mouth every 6 (six) hours as needed for pain.  30 tablet  0    Allergies  Allergen Reactions  . Codeine Nausea And Vomiting    Projectile vomiting  . Penicillins Rash    Tolerates Rocephine    Review of Systems proved appetite and strength no shortness of breath not taking much pain medicine  BP 147/86  Pulse 88  Resp 20  Ht 5\' 3"  (1.6 m)  Wt 116 lb (52.617 kg)  BMI 20.55 kg/m2  SpO2 98% Physical Exam Alert and comfortable Lungs clear Right thoracotomy incision well-healed Cardiac rhythm regular Minimal edema of lower extremities  Diagnostic Tests: Chest x-ray with minimal postop changes no effusion or pneumothorax   Impression: Doing well one month after surgery.  Plan:   Continue current therapy.  We'll follow patient in 2 months with chest x-ray.  CT of chest 6 months postop to follow residual right upper lobe nodule more medial.

## 2012-07-19 ENCOUNTER — Ambulatory Visit: Payer: Medicare HMO | Admitting: Cardiothoracic Surgery

## 2012-07-31 ENCOUNTER — Other Ambulatory Visit: Payer: Self-pay | Admitting: *Deleted

## 2012-07-31 DIAGNOSIS — J9383 Other pneumothorax: Secondary | ICD-10-CM

## 2012-08-02 ENCOUNTER — Ambulatory Visit: Payer: Medicare HMO | Admitting: Cardiothoracic Surgery

## 2012-08-09 ENCOUNTER — Ambulatory Visit (INDEPENDENT_AMBULATORY_CARE_PROVIDER_SITE_OTHER): Payer: Medicare HMO | Admitting: Cardiothoracic Surgery

## 2012-08-09 ENCOUNTER — Ambulatory Visit
Admission: RE | Admit: 2012-08-09 | Discharge: 2012-08-09 | Disposition: A | Discharge status: Home / Self Care | Payer: Medicare HMO | Source: Ambulatory Visit | Attending: Cardiothoracic Surgery | Admitting: Cardiothoracic Surgery

## 2012-08-09 ENCOUNTER — Ambulatory Visit: Payer: Medicare HMO | Admitting: Cardiothoracic Surgery

## 2012-08-09 ENCOUNTER — Encounter: Payer: Self-pay | Admitting: Cardiothoracic Surgery

## 2012-08-09 DIAGNOSIS — Z09 Encounter for follow-up examination after completed treatment for conditions other than malignant neoplasm: Secondary | ICD-10-CM

## 2012-08-09 DIAGNOSIS — C3410 Malignant neoplasm of upper lobe, unspecified bronchus or lung: Secondary | ICD-10-CM

## 2012-08-09 DIAGNOSIS — J9383 Other pneumothorax: Secondary | ICD-10-CM

## 2012-08-09 NOTE — Progress Notes (Signed)
PCP is Cassell Smiles., MD Referring Provider is Dalia Heading, MD  Chief Complaint  Patient presents with  . Routine Post Op    2 month f/u with cxr    HPI: 72 year old Caucasian female with a remote smoking history, severe COPD on home oxygen status post right VATS for resection of blebs and persistent spontaneous pneumothorax December 2013. 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 A second 1.5 cm nodule in the medial aspect of the upper lobe was not resected due to extremely poor PFTs and adhesions in its location to prevent wedge resection. This will be checked with a followup CT scan of chest in July 2014 to see if it is increased in size. .  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  Since last visit the patient has increased her weight by 10 pounds and feels better. She remains on 3 L of nasal cannula. She has not been rehospitalized for COPD flare. She is not taking prednisone. 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 She denies productive cough or fever. Past Medical History  Diagnosis Date  . COPD (chronic obstructive pulmonary disease)   . Thyroid disease   . Pernicious anemia   . Hypertension     Past Surgical History  Procedure Laterality Date  . Abdominal hysterectomy    . Appendectomy    . Cholecystectomy    . Tubal ligation    . Chest tube insertion  03/08/2012    Procedure: CHEST TUBE INSERTION;  Surgeon: Kerin Perna, MD;  Location: [facility] OR;  Service: Thoracic;  Laterality: Right;  . Video assisted thoracoscopy  03/17/2012    Procedure: VIDEO ASSISTED THORACOSCOPY;  Surgeon: Kerin Perna, MD;  Location: [facility] OR;  Service: Thoracic;  Laterality: Right;  . Resection of apical bleb  03/17/2012    Procedure: RESECTION OF APICAL BLEB;  Surgeon: Kerin Perna, MD;   Location: [facility] OR;  Service: Thoracic;  Laterality: Right;  stapling of bleb    Family History  Problem Relation Age of Onset  . Cancer Mother   . Heart failure Brother     Social History History  Substance Use Topics  . Smoking status: Former Smoker    Types: Cigarettes  . Smokeless tobacco: Former Neurosurgeon    Quit date: 03/07/2006  . Alcohol Use: No    Current Outpatient Prescriptions  Medication Sig Dispense Refill  . albuterol (PROVENTIL) (2.5 MG/3ML) 0.083% nebulizer solution Take 2.5 mg by nebulization 4 (four) times daily. *May use up to every 4 hours as needed for shortness of breath*      . ALPRAZolam (XANAX) 1 MG tablet Take 1 mg by mouth 4 (four) times daily as needed. For anxiety and/or sleep      . aspirin 81 MG chewable tablet Chew 81 mg by mouth every morning.      . cyanocobalamin (,VITAMIN B-12,) 1000 MCG/ML injection Inject 1,000 mcg into the muscle every 30 (thirty) days.      . Fluticasone-Salmeterol (ADVAIR) 250-50 MCG/DOSE AEPB Inhale 1 puff into the lungs every 12 (twelve) hours.      Marland Kitchen guaiFENesin (MUCINEX) 600 MG 12 hr tablet Take 600 mg by mouth every morning.       Marland Kitchen ibuprofen (ADVIL,MOTRIN) 200 MG tablet Take 600  mg by mouth every 6 (six) hours as needed. For pain      . levothyroxine (SYNTHROID, LEVOTHROID) 88 MCG tablet Take 100 mcg by mouth daily before breakfast. BRAND MEDICALLY NECESSARY      . loratadine (CLARITIN) 10 MG tablet Take 10 mg by mouth every morning.      . Multiple Vitamin (MULTIVITAMIN WITH MINERALS) TABS Take 1 tablet by mouth daily.      . NON FORMULARY OXYGEN      24/7      . potassium chloride SA (K-DUR,KLOR-CON) 20 MEQ tablet Take 20 mEq by mouth daily.      . theophylline (THEO-24) 200 MG 24 hr capsule Take 200 mg by mouth daily.      . traMADol (ULTRAM) 50 MG tablet Take 1-2 tablets (50-100 mg total) by mouth every 6 (six) hours as needed for pain.  30 tablet  0   No current facility-administered medications for this visit.     Allergies  Allergen Reactions  . Codeine Nausea And Vomiting    Projectile vomiting  . Penicillins Rash    Tolerates Rocephine    Review of Systems No fever, minimal postthoracotomy pain not requiring meds, mild edema of her right greater than left ankles BP 139/77  Pulse 89  Resp 18  Ht 5\' 3"  (1.6 m)  Wt 126 lb (57.153 kg)  BMI 22.33 kg/m2  SpO2 98% Physical Exam Pleasant and alert 72 year old female accompanied by daughter Lungs with distant breath sounds but clear Right VATS incision well-healed Cardiac regular rhythm without murmur Abdomen soft Extremities warm mild pedal edema Neuro intact  Diagnostic Tests: Chest x-ray COPD changes no pneumothorax Scarring from surgery No discrete pulmonary mass noted  Impression: Stable after VATS for pneumothorax with resection of incidental 2 cm adenocarcinoma. Patient return for CT scan of the chest in July to followup the remaining 1.5 cm nodule in the posterior medial aspect of the right upper lobe. This has not been biopsied. A PET scan has not been done due to the recent timing of surgery.  Plan: Return in July 2014 for CTA chest followup right upper lobe nodule

## 2012-09-15 ENCOUNTER — Other Ambulatory Visit: Payer: Self-pay | Admitting: *Deleted

## 2012-10-20 LAB — BUN: BUN: 23 mg/dL (ref 6–23)

## 2012-10-20 LAB — CREATININE, SERUM: Creat: 0.87 mg/dL (ref 0.50–1.10)

## 2012-10-25 ENCOUNTER — Encounter: Payer: Self-pay | Admitting: Cardiothoracic Surgery

## 2012-10-25 ENCOUNTER — Ambulatory Visit (INDEPENDENT_AMBULATORY_CARE_PROVIDER_SITE_OTHER): Payer: Medicare HMO | Admitting: Cardiothoracic Surgery

## 2012-10-25 ENCOUNTER — Ambulatory Visit
Admission: RE | Admit: 2012-10-25 | Discharge: 2012-10-25 | Disposition: A | Discharge status: Home / Self Care | Payer: Medicare HMO | Source: Ambulatory Visit | Attending: Cardiothoracic Surgery | Admitting: Cardiothoracic Surgery

## 2012-10-25 VITALS — BP 154/75 | HR 96 | Resp 20 | Ht 63.0 in | Wt 126.0 lb

## 2012-10-25 DIAGNOSIS — J939 Pneumothorax, unspecified: Secondary | ICD-10-CM

## 2012-10-25 DIAGNOSIS — J9383 Other pneumothorax: Secondary | ICD-10-CM

## 2012-10-25 DIAGNOSIS — J439 Emphysema, unspecified: Secondary | ICD-10-CM

## 2012-10-25 DIAGNOSIS — R911 Solitary pulmonary nodule: Secondary | ICD-10-CM

## 2012-10-25 DIAGNOSIS — Z09 Encounter for follow-up examination after completed treatment for conditions other than malignant neoplasm: Secondary | ICD-10-CM

## 2012-10-25 MED ORDER — IOHEXOL 300 MG/ML  SOLN
75.0000 mL | Freq: Once | INTRAMUSCULAR | Status: AC | PRN
  Administered 2012-10-25: 75 mL via INTRAVENOUS

## 2012-10-25 NOTE — Progress Notes (Signed)
PCP is Cassell Smiles., MD Referring Provider is Dalia Heading, MD  Chief Complaint  Patient presents with  . Routine Post Op    3 month f/u with Chest CT,   . Lung Lesion    surveillance of right upper lobe nodule    HPI: 72 year old female with severe end-stage COPD on home oxygen returns for 6 month followup after undergoing right VATS for recurrent severe pneumothorax with resection of blebs as well as resection of a 2 cm adenocarcinoma E. GFR positive. She had a scarred density in the posterior aspect of the right upper lobe which was not resected do to her extremely poor pulmonary reserve. Clinically she is recovered fro from his surgery. She has not yet had a PET scan. She did have a followup CT scan which showed a residual infiltrated density with central cavitation in the posterior aspect of the right upper lobe to have increased in size since the preop CT scan. In the past month she has also been treated for bronchopneumonia by her primary care physician with oral Z-Pak. She denies weight loss pain or change in her breathing. She does note some ankle swelling.  After her surgery she was discussed at the thoracic oncology conference and further chemotherapy or radiation was not recommended due to her severe lung disease.  Past Medical History  Diagnosis Date  . COPD (chronic obstructive pulmonary disease)   . Thyroid disease   . Pernicious anemia   . Hypertension     Past Surgical History  Procedure Laterality Date  . Abdominal hysterectomy    . Appendectomy    . Cholecystectomy    . Tubal ligation    . Chest tube insertion  03/08/2012    Procedure: CHEST TUBE INSERTION;  Surgeon: Kerin Perna, MD;  Location: [facility] OR;  Service: Thoracic;  Laterality: Right;  . Video assisted thoracoscopy  03/17/2012    Procedure: VIDEO ASSISTED THORACOSCOPY;  Surgeon: Kerin Perna, MD;  Location: [facility] OR;  Service: Thoracic;  Laterality: Right;  . Resection of apical bleb  03/17/2012     Procedure: RESECTION OF APICAL BLEB;  Surgeon: Kerin Perna, MD;  Location: [facility] OR;  Service: Thoracic;  Laterality: Right;  stapling of bleb    Family History  Problem Relation Age of Onset  . Cancer Mother   . Heart failure Brother     Social History History  Substance Use Topics  . Smoking status: Former Smoker    Types: Cigarettes  . Smokeless tobacco: Former Neurosurgeon    Quit date: 03/07/2006  . Alcohol Use: No    Current Outpatient Prescriptions  Medication Sig Dispense Refill  . albuterol (PROVENTIL) (2.5 MG/3ML) 0.083% nebulizer solution Take 2.5 mg by nebulization 4 (four) times daily. *May use up to every 4 hours as needed for shortness of breath*      . ALPRAZolam (XANAX) 1 MG tablet Take 1 mg by mouth 4 (four) times daily as needed. For anxiety and/or sleep      . aspirin 81 MG chewable tablet Chew 81 mg by mouth every morning.      . cyanocobalamin (,VITAMIN B-12,) 1000 MCG/ML injection Inject 1,000 mcg into the muscle every 30 (thirty) days.      . Fluticasone-Salmeterol (ADVAIR) 250-50 MCG/DOSE AEPB Inhale 1 puff into the lungs every 12 (twelve) hours.      Marland Kitchen guaiFENesin (MUCINEX) 600 MG 12 hr tablet Take 600 mg by mouth every morning.       Marland Kitchen  ibuprofen (ADVIL,MOTRIN) 200 MG tablet Take 600 mg by mouth every 6 (six) hours as needed. For pain      . levothyroxine (SYNTHROID, LEVOTHROID) 88 MCG tablet Take 100 mcg by mouth daily before breakfast. BRAND MEDICALLY NECESSARY      . loratadine (CLARITIN) 10 MG tablet Take 10 mg by mouth every morning.      . Multiple Vitamin (MULTIVITAMIN WITH MINERALS) TABS Take 1 tablet by mouth daily.      . NON FORMULARY OXYGEN      24/7      . potassium chloride SA (K-DUR,KLOR-CON) 20 MEQ tablet Take 20 mEq by mouth daily.      . traMADol (ULTRAM) 50 MG tablet Take 1-2 tablets (50-100 mg total) by mouth every 6 (six) hours as needed for pain.  30 tablet  0   No current facility-administered medications for this visit.    Allergies   Allergen Reactions  . Codeine Nausea And Vomiting    Projectile vomiting  . Penicillins Rash    Tolerates Rocephine    Review of Systems no recent fever no hemoptysis baseline productive cough baseline shortness of breath and baseline oxygen requirement unchanged  BP 154/75  Pulse 96  Resp 20  Ht 5\' 3"  (1.6 m)  Wt 126 lb (57.153 kg)  BMI 22.33 kg/m2  SpO2 96% Physical Exam Alert and comfortable accompanied by daughter HEENT normocephalic Neck without mass or JVD Thorax with scattered rhonchi right greater than left well-healed right thoracotomy scar Cardiac increased heart rate without gallop or murmur Abdomen soft without mass Extremities with mild ankle edema no cyanosis or tenderness Neuro intact    Diagnostic Tests: CT scan reviewed with patient and daughter. The infiltrated density in the posterior aspect right upper lobe is increased over the past 6 months but is still without clear margins and appears more inflammatory. However due to the change in size we'll proceed with a PET scan and then discuss potential biopsy. Because of her recent symptoms of bronchopneumonia she will begin a digital course of oral antibiotics-Levaquin 500 mg daily.  Impression: No evidence recurrent pneumothorax but with increased in the infiltrated density in the posterior aspect of the right upper lobe from the previous CT scan 6 months ago.  Plan: Proceed with PET scan and then discuss results with patient

## 2012-10-26 ENCOUNTER — Other Ambulatory Visit: Payer: Self-pay | Admitting: *Deleted

## 2012-10-26 DIAGNOSIS — J9383 Other pneumothorax: Secondary | ICD-10-CM

## 2012-10-31 ENCOUNTER — Encounter (HOSPITAL_COMMUNITY)
Admission: RE | Admit: 2012-10-31 | Discharge: 2012-10-31 | Disposition: A | Discharge status: Home / Self Care | Payer: Medicare HMO | Source: Ambulatory Visit | Attending: Cardiothoracic Surgery | Admitting: Cardiothoracic Surgery

## 2012-10-31 DIAGNOSIS — J9383 Other pneumothorax: Secondary | ICD-10-CM | POA: Insufficient documentation

## 2012-10-31 DIAGNOSIS — C3490 Malignant neoplasm of unspecified part of unspecified bronchus or lung: Secondary | ICD-10-CM | POA: Insufficient documentation

## 2012-10-31 LAB — GLUCOSE, CAPILLARY: Glucose-Capillary: 113 mg/dL — ABNORMAL HIGH (ref 70–99)

## 2012-10-31 MED ORDER — FLUDEOXYGLUCOSE F - 18 (FDG) INJECTION
16.1000 | Freq: Once | INTRAVENOUS | Status: AC | PRN
  Administered 2012-10-31: 16.1 via INTRAVENOUS

## 2012-11-01 ENCOUNTER — Other Ambulatory Visit: Payer: Self-pay

## 2012-11-01 DIAGNOSIS — D381 Neoplasm of uncertain behavior of trachea, bronchus and lung: Secondary | ICD-10-CM

## 2012-11-03 ENCOUNTER — Other Ambulatory Visit: Payer: Self-pay | Admitting: Radiology

## 2012-11-07 ENCOUNTER — Encounter (HOSPITAL_COMMUNITY): Payer: Self-pay | Admitting: Pharmacy Technician

## 2012-11-08 ENCOUNTER — Ambulatory Visit (HOSPITAL_COMMUNITY)
Admission: RE | Admit: 2012-11-08 | Discharge: 2012-11-08 | Disposition: A | Discharge status: Home / Self Care | Payer: Medicare HMO | Source: Ambulatory Visit | Attending: Cardiothoracic Surgery | Admitting: Cardiothoracic Surgery

## 2012-11-08 ENCOUNTER — Ambulatory Visit (HOSPITAL_COMMUNITY)
Admission: RE | Admit: 2012-11-08 | Discharge: 2012-11-08 | Disposition: A | Discharge status: Home / Self Care | Payer: Medicare HMO | Source: Ambulatory Visit | Attending: Interventional Radiology | Admitting: Interventional Radiology

## 2012-11-08 DIAGNOSIS — J8410 Pulmonary fibrosis, unspecified: Secondary | ICD-10-CM | POA: Insufficient documentation

## 2012-11-08 DIAGNOSIS — R222 Localized swelling, mass and lump, trunk: Secondary | ICD-10-CM | POA: Insufficient documentation

## 2012-11-08 DIAGNOSIS — D381 Neoplasm of uncertain behavior of trachea, bronchus and lung: Secondary | ICD-10-CM

## 2012-11-08 DIAGNOSIS — Z87891 Personal history of nicotine dependence: Secondary | ICD-10-CM | POA: Insufficient documentation

## 2012-11-08 DIAGNOSIS — D510 Vitamin B12 deficiency anemia due to intrinsic factor deficiency: Secondary | ICD-10-CM | POA: Insufficient documentation

## 2012-11-08 DIAGNOSIS — I10 Essential (primary) hypertension: Secondary | ICD-10-CM | POA: Insufficient documentation

## 2012-11-08 DIAGNOSIS — J449 Chronic obstructive pulmonary disease, unspecified: Secondary | ICD-10-CM | POA: Insufficient documentation

## 2012-11-08 DIAGNOSIS — J4489 Other specified chronic obstructive pulmonary disease: Secondary | ICD-10-CM | POA: Insufficient documentation

## 2012-11-08 DIAGNOSIS — E079 Disorder of thyroid, unspecified: Secondary | ICD-10-CM | POA: Insufficient documentation

## 2012-11-08 DIAGNOSIS — Z79899 Other long term (current) drug therapy: Secondary | ICD-10-CM | POA: Insufficient documentation

## 2012-11-08 LAB — CBC
HCT: 32.2 % — ABNORMAL LOW (ref 36.0–46.0)
Hemoglobin: 11 g/dL — ABNORMAL LOW (ref 12.0–15.0)
RBC: 3.78 MIL/uL — ABNORMAL LOW (ref 3.87–5.11)
RDW: 13.9 % (ref 11.5–15.5)
WBC: 4.6 10*3/uL (ref 4.0–10.5)

## 2012-11-08 LAB — APTT: aPTT: 35 seconds (ref 24–37)

## 2012-11-08 LAB — PROTIME-INR: INR: 0.97 (ref 0.00–1.49)

## 2012-11-08 MED ORDER — MIDAZOLAM HCL 2 MG/2ML IJ SOLN
INTRAMUSCULAR | Status: DC | PRN
  Administered 2012-11-08: 0.5 mg via INTRAVENOUS
  Administered 2012-11-08: 1 mg via INTRAVENOUS

## 2012-11-08 MED ORDER — FENTANYL CITRATE 0.05 MG/ML IJ SOLN
INTRAMUSCULAR | Status: AC
  Filled 2012-11-08: qty 2

## 2012-11-08 MED ORDER — SODIUM CHLORIDE 0.9 % IV SOLN
Freq: Once | INTRAVENOUS | Status: AC
  Administered 2012-11-08: 10:00:00 via INTRAVENOUS

## 2012-11-08 MED ORDER — FENTANYL CITRATE 0.05 MG/ML IJ SOLN
INTRAMUSCULAR | Status: DC | PRN
  Administered 2012-11-08: 25 ug via INTRAVENOUS
  Administered 2012-11-08: 12.5 ug via INTRAVENOUS

## 2012-11-08 MED ORDER — MIDAZOLAM HCL 2 MG/2ML IJ SOLN
INTRAMUSCULAR | Status: AC
  Filled 2012-11-08: qty 4

## 2012-11-08 NOTE — Procedures (Signed)
Technically successful CT guided biopsy of hypermetabolic mass within the right upper lobe.  No immediate post procedural complications.

## 2012-11-08 NOTE — H&P (Signed)
[name] is an 72 y.o. female.   Chief Complaint: right lung mass HPI: Patient with history of severe COPD,  right VATS for recurrent pneumothorax/resection of blebs/resection of 2 cm adenocarcinoma (approx 6 ms ago) and recent PET scan revealing a hypermetabolic RUL mass. She presents today for CT guided RUL lung mass biopsy.   Past Medical History  Diagnosis Date  . COPD (chronic obstructive pulmonary disease)   . Thyroid disease   . Pernicious anemia   . Hypertension     Past Surgical History  Procedure Laterality Date  . Abdominal hysterectomy    . Appendectomy    . Cholecystectomy    . Tubal ligation    . Chest tube insertion  03/08/2012    Procedure: CHEST TUBE INSERTION;  Surgeon: Kerin Perna, MD;  Location: [facility] OR;  Service: Thoracic;  Laterality: Right;  . Video assisted thoracoscopy  03/17/2012    Procedure: VIDEO ASSISTED THORACOSCOPY;  Surgeon: Kerin Perna, MD;  Location: [facility] OR;  Service: Thoracic;  Laterality: Right;  . Resection of apical bleb  03/17/2012    Procedure: RESECTION OF APICAL BLEB;  Surgeon: Kerin Perna, MD;  Location: [facility] OR;  Service: Thoracic;  Laterality: Right;  stapling of bleb    Family History  Problem Relation Age of Onset  . Cancer Mother   . Heart failure Brother    Social History:  reports that she has quit smoking. Her smoking use included Cigarettes. She smoked 0.00 packs per day. She quit smokeless tobacco use about 6 years ago. She reports that she does not drink alcohol or use illicit drugs.  Allergies:  Allergies  Allergen Reactions  . Codeine Nausea And Vomiting    Projectile vomiting  . Penicillins Rash    Tolerates Rocephine    Current outpatient prescriptions:albuterol (PROVENTIL) (2.5 MG/3ML) 0.083% nebulizer solution, Take 2.5 mg by nebulization every 4 (four) hours. , Disp: , Rfl: ;  ALPRAZolam (XANAX) 1 MG tablet, Take 1 mg by mouth 4 (four) times daily as needed. For anxiety and/or sleep, Disp: , Rfl: ;  aspirin  81 MG chewable tablet, Chew 81 mg by mouth every morning., Disp: , Rfl:  Fluticasone-Salmeterol (ADVAIR) 250-50 MCG/DOSE AEPB, Inhale 1 puff into the lungs every 12 (twelve) hours., Disp: , Rfl: ;  guaiFENesin (MUCINEX) 600 MG 12 hr tablet, Take 600 mg by mouth every morning. , Disp: , Rfl: ;  ibuprofen (ADVIL,MOTRIN) 200 MG tablet, Take 600 mg by mouth 2 (two) times daily as needed. For pain, Disp: , Rfl:  levothyroxine (SYNTHROID, LEVOTHROID) 100 MCG tablet, Take 100 mcg by mouth daily before breakfast. TAKE BRAND NAME ONLY, Disp: , Rfl: ;  loratadine (CLARITIN) 10 MG tablet, Take 10 mg by mouth every morning., Disp: , Rfl: ;  Multiple Vitamin (MULTIVITAMIN WITH MINERALS) TABS, Take 1 tablet by mouth daily., Disp: , Rfl: ;  NON FORMULARY, OXYGEN      24/7, Disp: , Rfl:  potassium chloride SA (K-DUR,KLOR-CON) 20 MEQ tablet, Take 20 mEq by mouth daily., Disp: , Rfl: ;  traMADol (ULTRAM) 50 MG tablet, Take 1-2 tablets (50-100 mg total) by mouth every 6 (six) hours as needed for pain., Disp: 30 tablet, Rfl: 0;  cyanocobalamin (,VITAMIN B-12,) 1000 MCG/ML injection, Inject 1,000 mcg into the muscle every 30 (thirty) days., Disp: , Rfl:    Results for orders placed during the hospital encounter of 10/31/12  GLUCOSE, CAPILLARY      Result Value Range   Glucose-Capillary 113 (*)  70 - 99 mg/dL   04/10/08 labs pending Review of Systems  Constitutional: Negative for fever and chills.  Respiratory: Positive for cough and shortness of breath. Negative for hemoptysis.   Cardiovascular: Negative for chest pain.  Gastrointestinal: Negative for nausea, vomiting and abdominal pain.  Musculoskeletal: Negative for back pain.  Neurological: Negative for headaches.    Blood pressure 179/75, pulse 81, temperature 98 F (36.7 C), temperature source Oral, resp. rate 18, height 5' 3.5" (1.613 m), weight 125 lb (56.7 kg), SpO2 100.00%. Physical Exam  Constitutional: She is oriented to person, place, and time. She  appears well-developed and well-nourished.  Cardiovascular: Normal rate and regular rhythm.   Respiratory: Effort normal. She has no wheezes.  Distant BS bilat  GI: Soft. Bowel sounds are normal. There is no tenderness.  Musculoskeletal: Normal range of motion. She exhibits edema.  Neurological: She is alert and oriented to person, place, and time.     Assessment/Plan Pt with hx severe COPD, prior right VATS for recurrent ptx/resection of blebs/resection of 2 cm adenoca and recent PET revealing hypermetabolic RUL lung mass. Plan is for CT guided biopsy of the RUL lung mass today. Details/risks of procedure d/w pt/daughter with their understanding and consent.  ALLRED,D KEVIN 11/08/2012, 10:17 AM

## 2012-11-14 ENCOUNTER — Other Ambulatory Visit: Payer: Self-pay | Admitting: *Deleted

## 2012-11-15 ENCOUNTER — Ambulatory Visit
Admission: RE | Admit: 2012-11-15 | Discharge: 2012-11-15 | Disposition: A | Discharge status: Home / Self Care | Payer: Medicare HMO | Source: Ambulatory Visit | Attending: Cardiothoracic Surgery | Admitting: Cardiothoracic Surgery

## 2012-11-15 ENCOUNTER — Ambulatory Visit (INDEPENDENT_AMBULATORY_CARE_PROVIDER_SITE_OTHER): Payer: Self-pay | Admitting: Cardiothoracic Surgery

## 2012-11-15 ENCOUNTER — Encounter: Payer: Self-pay | Admitting: Cardiothoracic Surgery

## 2012-11-15 ENCOUNTER — Other Ambulatory Visit: Payer: Self-pay | Admitting: *Deleted

## 2012-11-15 DIAGNOSIS — C3410 Malignant neoplasm of upper lobe, unspecified bronchus or lung: Secondary | ICD-10-CM

## 2012-11-15 DIAGNOSIS — Z09 Encounter for follow-up examination after completed treatment for conditions other than malignant neoplasm: Secondary | ICD-10-CM

## 2012-11-15 NOTE — Progress Notes (Signed)
PCP is Cassell Smiles., MD Referring Provider is Dalia Heading, MD  Chief Complaint  Patient presents with  . Follow-up    to discuss Pet and BX    HPI: Patient returns for discussion of recent transthoracic needle biopsy of right upper lobe density  . Patient status post right VATS, resection of blebs, and wedge resection of a small adenocarcinoma December 2013. A second more central scarlike density in the right upper lobe was not resected at that time because the patient would require lobectomy which due to her COPD cannot be tolerated. A followup CT scan approximately 6 months postop showed increased density but also some postoperative changes. A PET scan was performed which showed the parenchymal lung density to have SUV of 9.5. No mediastinal adenopathy -activity was noted. \ Patient then underwent a CT-guided needle biopsy of the right upper lobe density which returned inflammation and scarring with granulomatous changes angina cells. Fungal and AFB stains were negative.  I discussed the results of the biopsy with the patient and her family and they understand that recurrent malignancy is not documented. SHe understand that she still has to be followed with serial CT scans to check for recurrence. Past Medical History  Diagnosis Date  . COPD (chronic obstructive pulmonary disease)   . Thyroid disease   . Pernicious anemia   . Hypertension     Past Surgical History  Procedure Laterality Date  . Abdominal hysterectomy    . Appendectomy    . Cholecystectomy    . Tubal ligation    . Chest tube insertion  03/08/2012    Procedure: CHEST TUBE INSERTION;  Surgeon: Kerin Perna, MD;  Location: [facility] OR;  Service: Thoracic;  Laterality: Right;  . Video assisted thoracoscopy  03/17/2012    Procedure: VIDEO ASSISTED THORACOSCOPY;  Surgeon: Kerin Perna, MD;  Location: [facility] OR;  Service: Thoracic;  Laterality: Right;  . Resection of apical bleb  03/17/2012    Procedure: RESECTION OF  APICAL BLEB;  Surgeon: Kerin Perna, MD;  Location: [facility] OR;  Service: Thoracic;  Laterality: Right;  stapling of bleb    Family History  Problem Relation Age of Onset  . Cancer Mother   . Heart failure Brother     Social History History  Substance Use Topics  . Smoking status: Former Smoker    Types: Cigarettes  . Smokeless tobacco: Former Neurosurgeon    Quit date: 03/07/2006  . Alcohol Use: No    Current Outpatient Prescriptions  Medication Sig Dispense Refill  . albuterol (PROVENTIL) (2.5 MG/3ML) 0.083% nebulizer solution Take 2.5 mg by nebulization every 4 (four) hours.       . ALPRAZolam (XANAX) 1 MG tablet Take 1 mg by mouth 4 (four) times daily as needed. For anxiety and/or sleep      . aspirin 81 MG chewable tablet Chew 81 mg by mouth every morning.      . cyanocobalamin (,VITAMIN B-12,) 1000 MCG/ML injection Inject 1,000 mcg into the muscle every 30 (thirty) days.      . Fluticasone-Salmeterol (ADVAIR) 250-50 MCG/DOSE AEPB Inhale 1 puff into the lungs every 12 (twelve) hours.      Marland Kitchen guaiFENesin (MUCINEX) 600 MG 12 hr tablet Take 600 mg by mouth every morning.       Marland Kitchen ibuprofen (ADVIL,MOTRIN) 200 MG tablet Take 600 mg by mouth 2 (two) times daily as needed. For pain      . levothyroxine (SYNTHROID, LEVOTHROID) 100 MCG tablet Take 100 mcg  by mouth daily before breakfast. TAKE BRAND NAME ONLY      . loratadine (CLARITIN) 10 MG tablet Take 10 mg by mouth every morning.      . Multiple Vitamin (MULTIVITAMIN WITH MINERALS) TABS Take 1 tablet by mouth daily.      . NON FORMULARY OXYGEN      24/7      . potassium chloride SA (K-DUR,KLOR-CON) 20 MEQ tablet Take 20 mEq by mouth daily.      . traMADol (ULTRAM) 50 MG tablet Take 1-2 tablets (50-100 mg total) by mouth every 6 (six) hours as needed for pain.  30 tablet  0   No current facility-administered medications for this visit.    Allergies  Allergen Reactions  . Codeine Nausea And Vomiting    Projectile vomiting  . Penicillins  Rash    Tolerates Rocephine    Review of Systems chronic COPD on home oxygen limited  activity  tolerance. Minimal transient hemoptysis after lung biopsy now resolved. She did note redness and a rash formation in her left lower lateral leg which is warm and appears to be cellulitis. Do not think that is a contrast reaction    BP 160/83  Pulse 89  Resp 16  Ht 5\' 3"  (1.6 m)  Wt 126 lb (57.153 kg)  BMI 22.33 kg/m2  SpO2 99% Physical Exam Alert and pleasant Breath sounds distant Heart rhythm regular No palpable adenopathy the neck  probable cellulitis left lower extremity  Diagnostic Tests: Biopsy results shared with patient and family-postop inflammation, granulomatous changes and giant cell formation. AFB stains negative  Impression: Status post resection right upper lobe stage I adenocarcinoma December 2013  Plan: Return to review 1 year annual CT scan in December 201. Call primary care physician for review of cellulitis of left lower leg it does not improve with the Z-Pak

## 2013-02-16 ENCOUNTER — Other Ambulatory Visit (HOSPITAL_COMMUNITY): Payer: Self-pay | Admitting: Internal Medicine

## 2013-02-16 ENCOUNTER — Ambulatory Visit (HOSPITAL_COMMUNITY)
Admission: RE | Admit: 2013-02-16 | Discharge: 2013-02-16 | Disposition: A | Discharge status: Home / Self Care | Payer: Medicare HMO | Source: Ambulatory Visit | Attending: Internal Medicine | Admitting: Internal Medicine

## 2013-02-16 DIAGNOSIS — R609 Edema, unspecified: Secondary | ICD-10-CM

## 2013-02-16 DIAGNOSIS — M79609 Pain in unspecified limb: Secondary | ICD-10-CM | POA: Insufficient documentation

## 2013-02-23 ENCOUNTER — Other Ambulatory Visit: Payer: Self-pay | Admitting: *Deleted

## 2013-03-12 ENCOUNTER — Other Ambulatory Visit (HOSPITAL_COMMUNITY): Payer: Self-pay | Admitting: Internal Medicine

## 2013-03-12 DIAGNOSIS — R609 Edema, unspecified: Secondary | ICD-10-CM

## 2013-03-14 ENCOUNTER — Ambulatory Visit (HOSPITAL_COMMUNITY)
Admission: RE | Admit: 2013-03-14 | Discharge: 2013-03-14 | Disposition: A | Discharge status: Home / Self Care | Payer: Medicare HMO | Source: Ambulatory Visit | Attending: Internal Medicine | Admitting: Internal Medicine

## 2013-03-14 ENCOUNTER — Other Ambulatory Visit (HOSPITAL_COMMUNITY): Payer: Self-pay | Admitting: Internal Medicine

## 2013-03-14 ENCOUNTER — Other Ambulatory Visit (HOSPITAL_COMMUNITY): Payer: Self-pay | Admitting: Family Medicine

## 2013-03-14 ENCOUNTER — Ambulatory Visit (HOSPITAL_COMMUNITY)
Admission: RE | Admit: 2013-03-14 | Discharge: 2013-03-14 | Disposition: A | Discharge status: Home / Self Care | Payer: Medicare HMO | Source: Ambulatory Visit | Attending: Family Medicine | Admitting: Family Medicine

## 2013-03-14 DIAGNOSIS — R609 Edema, unspecified: Secondary | ICD-10-CM

## 2013-03-14 DIAGNOSIS — Z139 Encounter for screening, unspecified: Secondary | ICD-10-CM

## 2013-03-14 DIAGNOSIS — I714 Abdominal aortic aneurysm, without rupture, unspecified: Secondary | ICD-10-CM | POA: Insufficient documentation

## 2013-03-14 LAB — BUN: BUN: 23 mg/dL (ref 6–23)

## 2013-03-14 LAB — CREATININE, SERUM: Creat: 1.13 mg/dL — ABNORMAL HIGH (ref 0.50–1.10)

## 2013-03-21 ENCOUNTER — Ambulatory Visit (INDEPENDENT_AMBULATORY_CARE_PROVIDER_SITE_OTHER): Payer: Medicare HMO | Admitting: Cardiothoracic Surgery

## 2013-03-21 ENCOUNTER — Ambulatory Visit
Admission: RE | Admit: 2013-03-21 | Discharge: 2013-03-21 | Disposition: A | Discharge status: Home / Self Care | Payer: Medicare HMO | Source: Ambulatory Visit | Attending: Cardiothoracic Surgery | Admitting: Cardiothoracic Surgery

## 2013-03-21 ENCOUNTER — Encounter: Payer: Self-pay | Admitting: Cardiothoracic Surgery

## 2013-03-21 DIAGNOSIS — C3410 Malignant neoplasm of upper lobe, unspecified bronchus or lung: Secondary | ICD-10-CM

## 2013-03-21 DIAGNOSIS — Z09 Encounter for follow-up examination after completed treatment for conditions other than malignant neoplasm: Secondary | ICD-10-CM

## 2013-03-21 MED ORDER — IOHEXOL 300 MG/ML  SOLN
75.0000 mL | Freq: Once | INTRAMUSCULAR | Status: AC | PRN
  Administered 2013-03-21: 75 mL via INTRAVENOUS

## 2013-03-21 NOTE — Progress Notes (Signed)
PCP is Cassell Smiles., MD Referring Provider is Dalia Heading, MD  Chief Complaint  Patient presents with  . Routine Post Op    4 month f/u with CT CHEST...she is being treated with BACTRIM DS for her right lower leg cellulitis by her PCP and dermatolgist    HPI: One year followup after right VATS, resection of blebs for pneumothorax and resection of a stage I adenocarcinoma. A second more central area of the right upper lobe with hypermetabolism on PET scan was not resected. A subsequent biopsy demonstrated this to be inflammatory. The patient is completed an extended course of oral antibiotics. She returns today with CT scan of chest to followup the right upper lobe infiltrated density.  Symptomatically the patient is improved. She still is on home oxygen. She has not been hospitalized for COPD. She is being actively treated for recurrent cellulitis of the right lower extremity and is current receiving care from a dermatologist as well as taking oral Septra.  Past Medical History  Diagnosis Date  . COPD (chronic obstructive pulmonary disease)   . Thyroid disease   . Pernicious anemia   . Hypertension     Past Surgical History  Procedure Laterality Date  . Abdominal hysterectomy    . Appendectomy    . Cholecystectomy    . Tubal ligation    . Chest tube insertion  03/08/2012    Procedure: CHEST TUBE INSERTION;  Surgeon: Kerin Perna, MD;  Location: [facility] OR;  Service: Thoracic;  Laterality: Right;  . Video assisted thoracoscopy  03/17/2012    Procedure: VIDEO ASSISTED THORACOSCOPY;  Surgeon: Kerin Perna, MD;  Location: [facility] OR;  Service: Thoracic;  Laterality: Right;  . Resection of apical bleb  03/17/2012    Procedure: RESECTION OF APICAL BLEB;  Surgeon: Kerin Perna, MD;  Location: [facility] OR;  Service: Thoracic;  Laterality: Right;  stapling of bleb    Family History  Problem Relation Age of Onset  . Cancer Mother   . Heart failure Brother     Social History History   Substance Use Topics  . Smoking status: Former Smoker    Types: Cigarettes  . Smokeless tobacco: Former Neurosurgeon    Quit date: 03/07/2006  . Alcohol Use: No    Current Outpatient Prescriptions  Medication Sig Dispense Refill  . albuterol (PROVENTIL) (2.5 MG/3ML) 0.083% nebulizer solution Take 2.5 mg by nebulization every 4 (four) hours.       . ALPRAZolam (XANAX) 1 MG tablet Take 1 mg by mouth 4 (four) times daily as needed. For anxiety and/or sleep      . aspirin 81 MG chewable tablet Chew 81 mg by mouth every morning.      . cyanocobalamin (,VITAMIN B-12,) 1000 MCG/ML injection Inject 1,000 mcg into the muscle every 30 (thirty) days.      . Fluticasone-Salmeterol (ADVAIR) 250-50 MCG/DOSE AEPB Inhale 1 puff into the lungs every 12 (twelve) hours.      Marland Kitchen guaiFENesin (MUCINEX) 600 MG 12 hr tablet Take 600 mg by mouth every morning.       Marland Kitchen ibuprofen (ADVIL,MOTRIN) 200 MG tablet Take 600 mg by mouth 2 (two) times daily as needed. For pain      . levothyroxine (SYNTHROID, LEVOTHROID) 100 MCG tablet Take 100 mcg by mouth daily before breakfast. TAKE BRAND NAME ONLY      . loratadine (CLARITIN) 10 MG tablet Take 10 mg by mouth every morning.      . Multiple Vitamin (  MULTIVITAMIN WITH MINERALS) TABS Take 1 tablet by mouth daily.      . NON FORMULARY OXYGEN      24/7      . potassium chloride SA (K-DUR,KLOR-CON) 20 MEQ tablet Take 20 mEq by mouth daily.      Marland Kitchen sulfamethoxazole-trimethoprim (BACTRIM DS,SEPTRA DS) 800-160 MG per tablet Take 1 tablet by mouth once.      . traMADol (ULTRAM) 50 MG tablet Take 1-2 tablets (50-100 mg total) by mouth every 6 (six) hours as needed for pain.  30 tablet  0   No current facility-administered medications for this visit.    Allergies  Allergen Reactions  . Codeine Nausea And Vomiting    Projectile vomiting  . Penicillins Rash    Tolerates Rocephine    Review of Systems tolerating normal activities around the house well on nasal cannula  BP 136/74   Pulse 83  Resp 18  Ht 5\' 3"  (1.6 m)  Wt 126 lb (57.153 kg)  BMI 22.33 kg/m2  SpO2 99% Physical Exam Alert and comfortable Lungs distant but clear Right thoracotomy incision well-healed Heart rhythm regular without murmur Ace wrap dressing over right lower extremity  Diagnostic Tests: CT scan of chest reviewed with patient. The infiltrated density in the central area the right upper lobe has significantly decreased in size is most likely due to inflammatory-granulomatous disease. Since it is not completely resolved we will continue to follow with a annual CT scan of chest, as well as to provide surveillance of possible recurrence of a small adenocarcinoma resected with the bleb disease.  Impression: No evidence of pulmonary malignancy on current scan Baseline severe but compensated COPD \ Plan: Return with CT scan of chest in one year

## 2013-10-08 ENCOUNTER — Other Ambulatory Visit (HOSPITAL_COMMUNITY): Payer: Self-pay | Admitting: Pulmonary Disease

## 2013-10-08 ENCOUNTER — Ambulatory Visit (HOSPITAL_COMMUNITY)
Admission: RE | Admit: 2013-10-08 | Discharge: 2013-10-08 | Disposition: A | Discharge status: Home / Self Care | Payer: Medicare HMO | Source: Ambulatory Visit | Attending: Pulmonary Disease | Admitting: Pulmonary Disease

## 2013-10-08 DIAGNOSIS — R05 Cough: Secondary | ICD-10-CM

## 2013-10-08 DIAGNOSIS — J449 Chronic obstructive pulmonary disease, unspecified: Secondary | ICD-10-CM

## 2013-10-08 DIAGNOSIS — R059 Cough, unspecified: Secondary | ICD-10-CM

## 2013-10-08 DIAGNOSIS — J438 Other emphysema: Secondary | ICD-10-CM | POA: Insufficient documentation

## 2013-12-06 ENCOUNTER — Encounter (HOSPITAL_COMMUNITY): Payer: Self-pay

## 2013-12-16 IMAGING — PT NM PET TUM IMG RESTAG (PS) SKULL BASE T - THIGH
1 of 6 series · 1 of 25 positions shown · non-contrast
Comparison: CT chest 03/14/2012

CLINICAL DATA: Subsequent treatment strategy for lung cancer.

NUCLEAR MEDICINE PET SKULL BASE TO THIGH
Fasting Blood Glucose:  113
TECHNIQUE: 16.1 mCi F-18 FDG was injected intravenously. CT data
was obtained and used for attenuation correction and anatomic
localization only.  (This was not acquired as a diagnostic CT
examination.) Additional exam technical data entered on
technologist worksheet.

[Series 1: pet ac · axial · 3.3mm · 4.69mm/px · 1 of 223 slices shown]
[im 112/223]
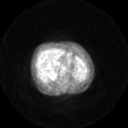

[1 of 25 positions shown; findings below may reference images not displayed]

FINDINGS: Neck: Suture line in the right hemithorax with associated cavitary
appearing soft tissue is again noted.  There is intense FDG uptake
within this area with an SUV max equal to 9.5, image 59.  No
hypermetabolic mediastinal or hilar adenopathy identified.

No axillary or supraclavicular adenopathy noted.  The heart size
appears normal.  There is no pericardial effusion.  Calcifications
involving the LAD, left circumflex and RCA coronary arteries noted.

Chest:  The ascending thoracic aorta measures

Abdomen/Pelvis:  No abnormal hypermetabolic activity within the
liver, pancreas, adrenal glands, or spleen.  No hypermetabolic
lymph nodes in the abdomen or pelvis. Infrarenal abdominal aortic
aneurysm measures 3.4 cm, image 149.

Skeleton:  No focal hypermetabolic activity to suggest skeletal
metastasis.
IMPRESSION: 1.  Examination is positive for intense FDG uptake localizing to
the suspicious area of increased soft tissue and cavitary change
within the right hemithorax in the area of postoperative change.
Findings are concerning for local recurrence of tumor.

2.  No evidence for distant metastatic disease.
3.  Coronary artery calcifications.

## 2014-02-04 ENCOUNTER — Encounter: Payer: Self-pay | Admitting: Cardiothoracic Surgery

## 2014-02-11 ENCOUNTER — Other Ambulatory Visit: Payer: Self-pay | Admitting: *Deleted

## 2014-02-11 DIAGNOSIS — C3411 Malignant neoplasm of upper lobe, right bronchus or lung: Secondary | ICD-10-CM

## 2014-02-18 ENCOUNTER — Other Ambulatory Visit (HOSPITAL_COMMUNITY): Payer: Self-pay | Admitting: Internal Medicine

## 2014-02-18 DIAGNOSIS — Z1231 Encounter for screening mammogram for malignant neoplasm of breast: Secondary | ICD-10-CM

## 2014-02-21 ENCOUNTER — Ambulatory Visit (HOSPITAL_COMMUNITY): Payer: Medicare HMO

## 2014-02-21 ENCOUNTER — Ambulatory Visit (HOSPITAL_COMMUNITY)
Admission: RE | Admit: 2014-02-21 | Discharge: 2014-02-21 | Disposition: A | Discharge status: Home / Self Care | Payer: Commercial Managed Care - HMO | Source: Ambulatory Visit | Attending: Internal Medicine | Admitting: Internal Medicine

## 2014-02-21 DIAGNOSIS — Z1231 Encounter for screening mammogram for malignant neoplasm of breast: Secondary | ICD-10-CM | POA: Diagnosis present

## 2014-02-25 ENCOUNTER — Telehealth: Payer: Self-pay

## 2014-02-25 NOTE — Telephone Encounter (Signed)
Patient called to be triaged for tcs. Please call her back at [phone]

## 2014-02-26 ENCOUNTER — Telehealth: Payer: Self-pay

## 2014-02-26 NOTE — Telephone Encounter (Signed)
Gastroenterology Pre-Procedure Review  Request Date: 02/26/2014 Requesting Physician: Dr. Gerarda Fraction  PATIENT REVIEW QUESTIONS: The patient responded to the following health history questions as indicated:    Pt has COPD and Emphysema  Please advise if OV needed prior to scheduling colonoscopy  1. Diabetes Melitis: no 2. Joint replacements in the past 12 months: no 3. Major health problems in the past 3 months: no 4. Has an artificial valve or MVP: no 5. Has a defibrillator: no 6. Has been advised in past to take antibiotics in advance of a procedure like teeth cleaning: no    MEDICATIONS & ALLERGIES:    Patient reports the following regarding taking any blood thinners:   Plavix? NO ASA    YES Coumadin? no  Patient confirms/reports the following medications:  Current Outpatient Prescriptions  Medication Sig Dispense Refill  . albuterol (PROVENTIL) (2.5 MG/3ML) 0.083% nebulizer solution Take 2.5 mg by nebulization every 4 (four) hours.     . ALPRAZolam (XANAX) 1 MG tablet Take 1 mg by mouth 4 (four) times daily as needed. For anxiety and/or sleep    . aspirin 81 MG chewable tablet Chew 81 mg by mouth every morning.    . cholecalciferol (VITAMIN D) 400 UNITS TABS tablet Take 400 Units by mouth daily.    Marland Kitchen doxycycline (VIBRAMYCIN) 100 MG capsule Take 100 mg by mouth 2 (two) times daily. For cellulitis in her legs    . ferrous sulfate 325 (65 FE) MG tablet Take 325 mg by mouth 3 (three) times daily with meals.    . Fluticasone Furoate (ARNUITY ELLIPTA) 200 MCG/ACT AEPB Inhale into the lungs.    Marland Kitchen guaiFENesin (MUCINEX) 600 MG 12 hr tablet Take 600 mg by mouth every morning.     Marland Kitchen ibuprofen (ADVIL,MOTRIN) 200 MG tablet Take 600 mg by mouth 2 (two) times daily as needed. For pain    . levothyroxine (SYNTHROID, LEVOTHROID) 100 MCG tablet Take 100 mcg by mouth daily before breakfast. TAKE BRAND NAME ONLY    . loratadine (CLARITIN) 10 MG tablet Take 10 mg by mouth every morning.    . NON  FORMULARY OXYGEN      24/7    . NON FORMULARY Co Q 10   One tablet daily    . NON FORMULARY Magnesium 250 mg    One tablet daily    . potassium chloride SA (K-DUR,KLOR-CON) 20 MEQ tablet Take 20 mEq by mouth daily.    Marland Kitchen torsemide (DEMADEX) 20 MG tablet Take 20 mg by mouth daily.    Marland Kitchen Umeclidinium-Vilanterol (ANORO ELLIPTA) 62.5-25 MCG/INH AEPB Inhale into the lungs.    . cyanocobalamin (,VITAMIN B-12,) 1000 MCG/ML injection Inject 1,000 mcg into the muscle every 30 (thirty) days.    . Fluticasone-Salmeterol (ADVAIR) 250-50 MCG/DOSE AEPB Inhale 1 puff into the lungs every 12 (twelve) hours.    . Multiple Vitamin (MULTIVITAMIN WITH MINERALS) TABS Take 1 tablet by mouth daily. (Patient not taking: Reported on 02/26/2014)    . sulfamethoxazole-trimethoprim (BACTRIM DS,SEPTRA DS) 800-160 MG per tablet Take 1 tablet by mouth once.    . traMADol (ULTRAM) 50 MG tablet Take 1-2 tablets (50-100 mg total) by mouth every 6 (six) hours as needed for pain. (Patient not taking: Reported on 02/26/2014) 30 tablet 0   No current facility-administered medications for this visit.    Patient confirms/reports the following allergies:  Allergies  Allergen Reactions  . Codeine Nausea And Vomiting    Projectile vomiting  . Penicillins Rash  Tolerates Rocephine    No orders of the defined types were placed in this encounter.    AUTHORIZATION INFORMATION Primary Insurance:   ID #:   Group #:  Pre-Cert / Auth required:  Pre-Cert / Auth #:   Secondary Insurance:   ID #:   Group #:  Pre-Cert / Auth required:  Pre-Cert / Auth #:   SCHEDULE INFORMATION: Procedure has been scheduled as follows:  Date:         Time:   Location:   This Gastroenterology Pre-Precedure Review Form is being routed to the following provider(s): R. Garfield Cornea, MD

## 2014-02-26 NOTE — Telephone Encounter (Signed)
See separate triage.  

## 2014-02-26 NOTE — Telephone Encounter (Signed)
OV scheduled for 04/16/2014 at 2:00 PM. Pt is aware.

## 2014-02-26 NOTE — Telephone Encounter (Signed)
Yes, let's do an office visit.

## 2014-03-19 ENCOUNTER — Other Ambulatory Visit: Payer: Self-pay | Admitting: *Deleted

## 2014-03-20 ENCOUNTER — Inpatient Hospital Stay: Admission: RE | Admit: 2014-03-20 | Payer: Medicare HMO | Source: Ambulatory Visit

## 2014-03-20 ENCOUNTER — Ambulatory Visit: Payer: Medicare HMO | Admitting: Cardiothoracic Surgery

## 2014-03-27 ENCOUNTER — Ambulatory Visit
Admission: RE | Admit: 2014-03-27 | Discharge: 2014-03-27 | Disposition: A | Discharge status: Home / Self Care | Payer: Medicare HMO | Source: Ambulatory Visit | Attending: Cardiothoracic Surgery | Admitting: Cardiothoracic Surgery

## 2014-03-27 ENCOUNTER — Ambulatory Visit: Payer: Medicare HMO | Admitting: Cardiothoracic Surgery

## 2014-03-27 ENCOUNTER — Ambulatory Visit (INDEPENDENT_AMBULATORY_CARE_PROVIDER_SITE_OTHER): Payer: Medicare HMO | Admitting: Cardiothoracic Surgery

## 2014-03-27 ENCOUNTER — Encounter: Payer: Self-pay | Admitting: Cardiothoracic Surgery

## 2014-03-27 VITALS — BP 165/80 | HR 90 | Resp 20 | Ht 63.0 in | Wt 146.0 lb

## 2014-03-27 DIAGNOSIS — IMO0001 Reserved for inherently not codable concepts without codable children: Secondary | ICD-10-CM

## 2014-03-27 DIAGNOSIS — R911 Solitary pulmonary nodule: Secondary | ICD-10-CM

## 2014-03-27 DIAGNOSIS — J449 Chronic obstructive pulmonary disease, unspecified: Secondary | ICD-10-CM

## 2014-03-27 DIAGNOSIS — Z85118 Personal history of other malignant neoplasm of bronchus and lung: Secondary | ICD-10-CM

## 2014-03-27 DIAGNOSIS — C3411 Malignant neoplasm of upper lobe, right bronchus or lung: Secondary | ICD-10-CM

## 2014-03-27 MED ORDER — IOHEXOL 300 MG/ML  SOLN
75.0000 mL | Freq: Once | INTRAMUSCULAR | Status: AC | PRN
  Administered 2014-03-27: 75 mL via INTRAVENOUS

## 2014-03-27 NOTE — Progress Notes (Signed)
PCP is Glo Herring., MD Referring Provider is Jamesetta So, MD  Chief Complaint  Patient presents with  . Follow-up    1 year f/u with Chest CT    HPI:the patient returns for a cure followup with CT scan of the chest. 2 years ago she had a right VATS with resection of blebs for recurrent pneumothorax. The specimen was a adenocarcinoma. Margins were clear. She had a second nitroglycerin density lower and more medial which was entrapped in adhesions and was not resected. This density with a Foley biopsied with a transthoracic needle biopsy was inflammatory. CT scan of the chest this area last year showed some resolution. CT scan of the chest year later, today, shows increase in size of this density now 9 mm greater measuring 1.9 cm. Clinically she is had some episodes of bronchitis with productive cough. She denies weight loss chest pain. She has developed a swelling of her right lower extremity treated by her primary care physician. Ultrasound was negative for DVT. She is been given diuretic and a course of antibiotic for possible cellulitis.  The patient has severe COPD and is now on 3 L nasal cannula at home. She is very little pulmonary reserve for any activity. She has had a mild increase in weight over the past year. Past Medical History  Diagnosis Date  . COPD (chronic obstructive pulmonary disease)   . Thyroid disease   . Pernicious anemia   . Hypertension     Past Surgical History  Procedure Laterality Date  . Abdominal hysterectomy    . Appendectomy    . Cholecystectomy    . Tubal ligation    . Chest tube insertion  03/08/2012    Procedure: CHEST TUBE INSERTION;  Surgeon: Ivin Poot, MD;  Location: [place];  Service: Thoracic;  Laterality: Right;  . Video assisted thoracoscopy  03/17/2012    Procedure: VIDEO ASSISTED THORACOSCOPY;  Surgeon: Ivin Poot, MD;  Location: [facility] OR;  Service: Thoracic;  Laterality: Right;  . Resection of apical bleb  03/17/2012   Procedure: RESECTION OF APICAL BLEB;  Surgeon: Ivin Poot, MD;  Location: [facility] OR;  Service: Thoracic;  Laterality: Right;  stapling of bleb    Family History  Problem Relation Age of Onset  . Cancer Mother   . Heart failure Brother     Social History History  Substance Use Topics  . Smoking status: Former Smoker    Types: Cigarettes  . Smokeless tobacco: Former Systems developer    Quit date: 03/07/2006  . Alcohol Use: No    Current Outpatient Prescriptions  Medication Sig Dispense Refill  . albuterol (PROVENTIL) (2.5 MG/3ML) 0.083% nebulizer solution Take 2.5 mg by nebulization every 4 (four) hours.     . ALPRAZolam (XANAX) 1 MG tablet Take 1 mg by mouth 4 (four) times daily as needed. For anxiety and/or sleep    . aspirin 81 MG chewable tablet Chew 81 mg by mouth every morning.    . cholecalciferol (VITAMIN D) 400 UNITS TABS tablet Take 400 Units by mouth daily.    . cyanocobalamin (,VITAMIN B-12,) 1000 MCG/ML injection Place 1,000 mcg under the tongue 3 (three) times daily.     . ferrous sulfate 325 (65 FE) MG tablet Take 325 mg by mouth 3 (three) times daily with meals.    . Fluticasone Furoate (ARNUITY ELLIPTA) 200 MCG/ACT AEPB Inhale into the lungs.    Marland Kitchen guaiFENesin (MUCINEX) 600 MG 12 hr tablet Take 600 mg by mouth  every morning.     Marland Kitchen ibuprofen (ADVIL,MOTRIN) 200 MG tablet Take 600 mg by mouth 2 (two) times daily as needed. For pain    . levothyroxine (SYNTHROID, LEVOTHROID) 100 MCG tablet Take 100 mcg by mouth daily before breakfast. TAKE BRAND NAME ONLY    . loratadine (CLARITIN) 10 MG tablet Take 10 mg by mouth every morning.    . NON FORMULARY OXYGEN      24/7    . NON FORMULARY Co Q 10   One tablet daily    . NON FORMULARY Magnesium 250 mg    One tablet daily    . potassium chloride SA (K-DUR,KLOR-CON) 20 MEQ tablet Take 20 mEq by mouth daily.    Marland Kitchen torsemide (DEMADEX) 20 MG tablet Take 20 mg by mouth daily.    Marland Kitchen Umeclidinium-Vilanterol (ANORO ELLIPTA) 62.5-25 MCG/INH AEPB  Inhale into the lungs.    . Fluticasone-Salmeterol (ADVAIR) 250-50 MCG/DOSE AEPB Inhale 1 puff into the lungs every 12 (twelve) hours.     No current facility-administered medications for this visit.    Allergies  Allergen Reactions  . Codeine Nausea And Vomiting    Projectile vomiting  . Penicillins Rash    Tolerates Rocephine    Review of Systemsrecent episode of bronchitis  BP 165/80 mmHg  Pulse 90  Resp 20  Ht 5\' 3"  (1.6 m)  Wt 146 lb (66.225 kg)  BMI 25.87 kg/m2  SpO2 96% Physical Exam Chronically ill but alert and pleasant Diminished breath sounds bilaterally No palpable nodes in the neck Heart rhythm regular without gallop Right leg with woody type edema with induration and erythema below the knee Neurologic nonfocal  Diagnostic Tests: CT chest shows increased in the right upper lobe nodular density from 1.0 to 1.9 cm, suspicious for cancer. There is a second smaller nodular density measuring which increased from 0.3cm to   1.3 cm also suspicious for recurrent cancer.  Impression: Increase in nodular densities of the right upper lobe associated with some flareup of bronchitis. Will give a course of oral antibiotic-Zithromax and a short prednisone taper and review the patient with chest x-ray in 3 weeks. We will discuss repeating the transthoracic needle biopsy of the right upper lobe density which has shown variability  in size over the past 18 months.   Plan:return in 3-4 weeks with chest x-ray after oral antibiotic therapy

## 2014-04-16 ENCOUNTER — Ambulatory Visit (INDEPENDENT_AMBULATORY_CARE_PROVIDER_SITE_OTHER): Payer: PPO | Admitting: Gastroenterology

## 2014-04-16 ENCOUNTER — Encounter: Payer: Self-pay | Admitting: Gastroenterology

## 2014-04-16 ENCOUNTER — Other Ambulatory Visit: Payer: Self-pay

## 2014-04-16 ENCOUNTER — Other Ambulatory Visit: Payer: Self-pay | Admitting: Cardiothoracic Surgery

## 2014-04-16 VITALS — BP 144/78 | HR 100 | Temp 97.5°F | Ht 64.0 in | Wt 142.0 lb

## 2014-04-16 DIAGNOSIS — C3410 Malignant neoplasm of upper lobe, unspecified bronchus or lung: Secondary | ICD-10-CM

## 2014-04-16 DIAGNOSIS — Z1211 Encounter for screening for malignant neoplasm of colon: Secondary | ICD-10-CM

## 2014-04-16 DIAGNOSIS — R1314 Dysphagia, pharyngoesophageal phase: Secondary | ICD-10-CM

## 2014-04-16 NOTE — Patient Instructions (Signed)
We have scheduled you for a colonoscopy, upper endoscopy, and dilation with Dr. Gala Romney in the near future.  Please call if you have any further abdominal pain or issues!

## 2014-04-16 NOTE — Progress Notes (Signed)
Primary Care Physician:  Glo Herring., MD Primary Gastroenterologist:  Dr. Gerarda Fraction Primary GI: Dr. Gala Romney   Chief Complaint  Patient presents with  . Colonoscopy    HPI:   [name] is a 74 y.o. female presenting today at the request of Dr. Gerarda Fraction for a screening colonoscopy.   Was doing well until December when prescribed antibiotics and prednisone. Had extreme constipation with abdominal discomfort. Still feels tender in left side. States she took Miralax and got "straightened out". BM usually now every other day to twice a week. Will take Miralax as needed, which works well for her. In the past had taken Miralax and a stool softener, but this was too much. No rectal bleeding. No N/V. No reflux. Occasional solid food dysphagia. A lot of trouble with bread. BPE in 2014 with mucosal ring at cervical esophagus obstructing a 12.5 mm diameter tablet. Mild age-related esophageal dysmotility noted. Recent CT chest with incidental finding of chronic calcified pancreatitis. Ill-defined stranding around the mid aspect of the main trunk of the SMA with recommendations to correlate with abdominal symptoms. Patient is declining any further work-up for pancreatitis or abdominal imaging at this time unless clinical status changes. On 3 liters  continuously.   Past Medical History  Diagnosis Date  . COPD (chronic obstructive pulmonary disease)   . Thyroid disease   . Pernicious anemia   . Hypertension   . Lung cancer     Past Surgical History  Procedure Laterality Date  . Abdominal hysterectomy    . Appendectomy    . Cholecystectomy    . Tubal ligation    . Chest tube insertion  03/08/2012    Procedure: CHEST TUBE INSERTION;  Surgeon: Ivin Poot, MD;  Location: [place];  Service: Thoracic;  Laterality: Right;  . Video assisted thoracoscopy  03/17/2012    Procedure: VIDEO ASSISTED THORACOSCOPY;  Surgeon: Ivin Poot, MD;  Location: [place];  Service: Thoracic;  Laterality: Right;    . Resection of apical bleb  03/17/2012    Procedure: RESECTION OF APICAL BLEB;  Surgeon: Ivin Poot, MD;  Location: [facility] OR;  Service: Thoracic;  Laterality: Right;  stapling of bleb    Current Outpatient Prescriptions  Medication Sig Dispense Refill  . albuterol (PROVENTIL) (2.5 MG/3ML) 0.083% nebulizer solution Take 2.5 mg by nebulization every 4 (four) hours.     . ALPRAZolam (XANAX) 1 MG tablet Take 1 mg by mouth 4 (four) times daily as needed. For anxiety and/or sleep    . aspirin 81 MG chewable tablet Chew 81 mg by mouth every morning.    . cholecalciferol (VITAMIN D) 400 UNITS TABS tablet Take 400 Units by mouth daily.    . cyanocobalamin (,VITAMIN B-12,) 1000 MCG/ML injection Place 1,000 mcg under the tongue 3 (three) times daily.     . ferrous sulfate 325 (65 FE) MG tablet Take 325 mg by mouth 3 (three) times daily with meals.    . Fluticasone Furoate (ARNUITY ELLIPTA) 200 MCG/ACT AEPB Inhale into the lungs.    . Fluticasone-Salmeterol (ADVAIR) 250-50 MCG/DOSE AEPB Inhale 1 puff into the lungs every 12 (twelve) hours.    Marland Kitchen ibuprofen (ADVIL,MOTRIN) 200 MG tablet Take 600 mg by mouth 2 (two) times daily as needed. For pain    . levothyroxine (SYNTHROID, LEVOTHROID) 100 MCG tablet Take 100 mcg by mouth daily before breakfast. TAKE BRAND NAME ONLY    . loratadine (CLARITIN) 10 MG tablet Take 10 mg by  mouth every morning.    . NON FORMULARY OXYGEN      24/7    . NON FORMULARY Co Q 10   One tablet daily    . NON FORMULARY Magnesium 250 mg    One tablet daily    . potassium chloride SA (K-DUR,KLOR-CON) 20 MEQ tablet Take 20 mEq by mouth daily.    Marland Kitchen torsemide (DEMADEX) 20 MG tablet Take 20 mg by mouth daily.    Marland Kitchen Umeclidinium-Vilanterol (ANORO ELLIPTA) 62.5-25 MCG/INH AEPB Inhale into the lungs.     No current facility-administered medications for this visit.    Allergies as of 04/16/2014 - Review Complete 04/16/2014  Allergen Reaction Noted  . Codeine Nausea And Vomiting  11/04/2011  . Penicillins Rash 11/04/2011    Family History  Problem Relation Age of Onset  . Cancer Mother   . Heart failure Brother   . Colon cancer Neg Hx     History   Social History  . Marital Status: Married    Spouse Name: N/A    Number of Children: N/A  . Years of Education: N/A   Occupational History  . Not on file.   Social History Main Topics  . Smoking status: Former Smoker    Types: Cigarettes  . Smokeless tobacco: Former Systems developer    Quit date: 03/07/2006  . Alcohol Use: No  . Drug Use: No  . Sexual Activity: Not on file   Other Topics Concern  . Not on file   Social History Narrative    Review of Systems: Gen: Denies any fever, chills, fatigue, weight loss, lack of appetite.  CV: Denies chest pain, heart palpitations, peripheral edema, syncope.  Resp: +DOE GI: see HPI GU : rare urinary burning/frequency MS: +joint pain Derm: Denies rash, itching, dry skin Psych: Denies depression, anxiety, memory loss, and confusion Heme: Denies bruising, bleeding, and enlarged lymph nodes.  Physical Exam: BP 144/78 mmHg  Pulse 100  Temp(Src) 97.5 F (36.4 C) (Oral)  Ht 5\' 4"  (1.626 m)  Wt 142 lb (64.411 kg)  BMI 24.36 kg/m2 General:   Alert and oriented. Pleasant and cooperative. Well-nourished and well-developed.  Head:  Normocephalic and atraumatic. Eyes:  Without icterus, sclera clear and conjunctiva pink.  Ears:  Normal auditory acuity. Nose:  No deformity, discharge,  or lesions. Mouth:  No deformity or lesions, oral mucosa pink.  Lungs:  Clear to auscultation bilaterally. Diminished bases Heart:  S1, S2 present without murmurs appreciated.  Abdomen:  +BS, soft, mild TTP LUQ and non-distended. No HSM noted. No guarding or rebound. No masses appreciated.  Rectal:  Deferred  Msk:  Symmetrical without gross deformities. Normal posture. Extremities:  Chronic right lower extremity edema greater than left, negative duplex ultrasound in past Neurologic:   Alert and  oriented x 4 Psych:  Alert and cooperative. Normal mood and affect.  CT chest Dec 2015  IMPRESSION: 1. Interval increase in size of right upper lobe pulmonary nodules, the largest of which within the right lung apex measures 1.9 cm in maximal diameter, previously, 1 cm - findings worrisome for metastatic disease recurrence. Further evaluation with PET-CT could be performed as clinically indicated. 2. Sequela of chronic calcified pancreatitis. 3. Ill-defined stranding surrounds the mid aspect of the main trunk of the SMA, incompletely imaged. Correlation for abdominal symptoms is recommended. Further evaluation could be performed with dedicated abdominal CT as clinically indicated.  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 Prominent  cricopharyngeus muscle incidentally noted. Mild age-related esophageal dysmotility. Small sliding hiatal hernia.

## 2014-04-17 ENCOUNTER — Encounter: Payer: Self-pay | Admitting: Cardiothoracic Surgery

## 2014-04-17 ENCOUNTER — Ambulatory Visit (INDEPENDENT_AMBULATORY_CARE_PROVIDER_SITE_OTHER): Payer: PPO | Admitting: Cardiothoracic Surgery

## 2014-04-17 ENCOUNTER — Ambulatory Visit
Admission: RE | Admit: 2014-04-17 | Discharge: 2014-04-17 | Disposition: A | Discharge status: Home / Self Care | Payer: PPO | Source: Ambulatory Visit | Attending: Cardiothoracic Surgery | Admitting: Cardiothoracic Surgery

## 2014-04-17 ENCOUNTER — Other Ambulatory Visit: Payer: Self-pay | Admitting: *Deleted

## 2014-04-17 VITALS — BP 143/82 | HR 100 | Resp 20 | Ht 64.0 in | Wt 142.0 lb

## 2014-04-17 DIAGNOSIS — C3490 Malignant neoplasm of unspecified part of unspecified bronchus or lung: Secondary | ICD-10-CM

## 2014-04-17 DIAGNOSIS — R911 Solitary pulmonary nodule: Secondary | ICD-10-CM

## 2014-04-17 DIAGNOSIS — Z85118 Personal history of other malignant neoplasm of bronchus and lung: Secondary | ICD-10-CM

## 2014-04-17 DIAGNOSIS — C3410 Malignant neoplasm of upper lobe, unspecified bronchus or lung: Secondary | ICD-10-CM

## 2014-04-17 NOTE — Progress Notes (Signed)
PCP is Glo Herring., MD Referring Provider is Jamesetta So, MD  Chief Complaint  Patient presents with  . Lung Lesion    3 week f/u with CXR    NKN:[id number] returns to discuss enlarging mass posterior segment right upper lobe, now almost 2 cm from 1 cm on previous scan and and familial would've today to history and and and and and and about a peroxide and then and and and and and and a the and and and a young minutes as a. History of severe COPD status post VATS for resection of blebs and recurrent pneumothorax Recent history of cellulitis right lower remedy somewhat improved on antibiotics   Past Medical History  Diagnosis Date  . COPD (chronic obstructive pulmonary disease)   . Thyroid disease   . Pernicious anemia   . Hypertension   . Lung cancer     Past Surgical History  Procedure Laterality Date  . Abdominal hysterectomy    . Appendectomy    . Cholecystectomy    . Tubal ligation    . Chest tube insertion  03/08/2012    Procedure: CHEST TUBE INSERTION;  Surgeon: Ivin Poot, MD;  Location: [facility];  Service: Thoracic;  Laterality: Right;  . Video assisted thoracoscopy  03/17/2012    Procedure: VIDEO ASSISTED THORACOSCOPY;  Surgeon: Ivin Poot, MD;  Location: [facility] OR;  Service: Thoracic;  Laterality: Right;  . Resection of apical bleb  03/17/2012    Procedure: RESECTION OF APICAL BLEB;  Surgeon: Ivin Poot, MD;  Location: [facility] OR;  Service: Thoracic;  Laterality: Right;  stapling of bleb    Family History  Problem Relation Age of Onset  . Cancer Mother   . Heart failure Brother   . Colon cancer Neg Hx     Social History History  Substance Use Topics  . Smoking status: Former Smoker    Types: Cigarettes  . Smokeless tobacco: Former Systems developer    Quit date: 03/07/2006  . Alcohol Use: No    Current Outpatient Prescriptions  Medication Sig Dispense Refill  . albuterol (PROVENTIL) (2.5 MG/3ML) 0.083% nebulizer solution Take 2.5 mg by nebulization every 4  (four) hours.     . ALPRAZolam (XANAX) 1 MG tablet Take 1 mg by mouth 4 (four) times daily as needed. For anxiety and/or sleep    . aspirin 81 MG chewable tablet Chew 81 mg by mouth every morning.    . cholecalciferol (VITAMIN D) 400 UNITS TABS tablet Take 400 Units by mouth daily.    . cyanocobalamin (,VITAMIN B-12,) 1000 MCG/ML injection Place 1,000 mcg under the tongue 3 (three) times daily.     . ferrous sulfate 325 (65 FE) MG tablet Take 325 mg by mouth 3 (three) times daily with meals.    . Fluticasone-Salmeterol (ADVAIR) 250-50 MCG/DOSE AEPB Inhale 1 puff into the lungs every 12 (twelve) hours.    Marland Kitchen ibuprofen (ADVIL,MOTRIN) 200 MG tablet Take 600 mg by mouth 2 (two) times daily as needed. For pain    . levothyroxine (SYNTHROID, LEVOTHROID) 100 MCG tablet Take 100 mcg by mouth daily before breakfast. TAKE BRAND NAME ONLY    . loratadine (CLARITIN) 10 MG tablet Take 10 mg by mouth every morning.    . NON FORMULARY OXYGEN      24/7    . NON FORMULARY Co Q 10   One tablet daily    . NON FORMULARY Magnesium 250 mg    One tablet daily    .  potassium chloride SA (K-DUR,KLOR-CON) 20 MEQ tablet Take 20 mEq by mouth daily.    Marland Kitchen torsemide (DEMADEX) 20 MG tablet Take 20 mg by mouth daily.     No current facility-administered medications for this visit.    Allergies  Allergen Reactions  . Codeine Nausea And Vomiting    Projectile vomiting  . Penicillins Rash    Tolerates Rocephine    Review of Systems   Patient remains On home oxygen with marginal exercise tolerance  BP 143/82 mmHg  Pulse 100  Resp 20  Ht 5\' 4"  (1.626 m)  Wt 142 lb (64.411 kg)  BMI 24.36 kg/m2  SpO2 97% Physical Exam Alert and appropriate Scattered rhonchi on exam No palpable cervical adenopathy Right lower leg still with cellulitis, edema with erythema and mild tenderness  Diagnostic Tests: Chest x-ray today shows COPD, faint opacification of density in medial aspect right upper lobe  Impression: Possible  new or recurrent non-small cell carcinoma right lung  Plan:we'll proceed with CT directed needle biopsy and have patient return for discussion. If she has developed a new focus of cancer she would not be a surgical candidate but potentially could be treated with stereotactic radiation therapy

## 2014-04-18 ENCOUNTER — Other Ambulatory Visit: Payer: Self-pay

## 2014-04-18 ENCOUNTER — Telehealth: Payer: Self-pay | Admitting: Internal Medicine

## 2014-04-18 DIAGNOSIS — Z1211 Encounter for screening for malignant neoplasm of colon: Secondary | ICD-10-CM

## 2014-04-18 DIAGNOSIS — R1314 Dysphagia, pharyngoesophageal phase: Secondary | ICD-10-CM

## 2014-04-18 MED ORDER — PEG-KCL-NACL-NASULF-NA ASC-C 100 G PO SOLR: 1.0000 | Freq: Once | ORAL | Status: DC

## 2014-04-18 NOTE — Telephone Encounter (Signed)
Called pt and was able to schedule procedure for 04/29/2014 @ 1245 with RMR. Instructions mailed.

## 2014-04-18 NOTE — Telephone Encounter (Signed)
Pt was seen on Tuesday and was calling back to set up her colonoscopy. Please return her call at [phone]

## 2014-04-19 ENCOUNTER — Other Ambulatory Visit: Payer: Self-pay

## 2014-04-19 DIAGNOSIS — R1314 Dysphagia, pharyngoesophageal phase: Secondary | ICD-10-CM | POA: Insufficient documentation

## 2014-04-19 DIAGNOSIS — Z1211 Encounter for screening for malignant neoplasm of colon: Secondary | ICD-10-CM | POA: Insufficient documentation

## 2014-04-19 MED ORDER — PEG 3350-KCL-NA BICARB-NACL 420 G PO SOLR: 4000.0000 mL | ORAL | Status: DC

## 2014-04-19 NOTE — Assessment & Plan Note (Addendum)
Chronic occasional solid food dysphagia, with BPE on file from 2014 noting mucosal ring at cervical esophagus obstructing a 12.5 mm tablet. Age-related dysmotility also playing a role. Recommend direct visualization with EGD at time of colonoscopy and therapeutic dilation.   Proceed with upper endoscopy/dilation in the near future with Dr. Gala Romney. The risks, benefits, and alternatives have been discussed in detail with patient. They have stated understanding and desire to proceed.  Patient is maintained on 3 liters O2 nasal cannula continuously

## 2014-04-19 NOTE — Assessment & Plan Note (Addendum)
74 year old female with need for initial screening colonoscopy, with chronic constipation taking Miralax as needed. No overt rectal bleeding noted. As of note, recent CT chest for history of lung cancer was completed, with incidental finding of chronic calcified pancreatitis, but patient denies any known prior history of this. Currently without abdominal pain or concerning signs. Declining further work-up such as dedicated CT with pancreatic protocol or EUS. With other comorbidities to recent noted increase in size of pulmonary nodules, would hold on any significant work-up for pancreas unless concerning signs. Ill-defined stranding around mid aspect of main trunk of SMA noted but not completely imaged; again, patient is declining any further work-up.   Proceed with TCS with Dr. Gala Romney in near future: the risks, benefits, and alternatives have been discussed with the patient in detail. The patient states understanding and desires to proceed. Patient is maintained on 3 liters O2 nasal cannula continuously

## 2014-04-22 NOTE — Progress Notes (Signed)
cc'ed to pcp °

## 2014-04-23 NOTE — Progress Notes (Signed)
cc'ed to pcp °

## 2014-04-29 ENCOUNTER — Ambulatory Visit (HOSPITAL_COMMUNITY)
Admission: RE | Admit: 2014-04-29 | Discharge: 2014-04-29 | Disposition: A | Discharge status: Home / Self Care | Payer: PPO | Source: Ambulatory Visit | Attending: Internal Medicine | Admitting: Internal Medicine

## 2014-04-29 ENCOUNTER — Encounter (HOSPITAL_COMMUNITY)
Admission: RE | Disposition: A | Discharge status: Home / Self Care | Payer: Self-pay | Source: Ambulatory Visit | Attending: Internal Medicine

## 2014-04-29 ENCOUNTER — Encounter (HOSPITAL_COMMUNITY): Payer: Self-pay | Admitting: *Deleted

## 2014-04-29 DIAGNOSIS — J449 Chronic obstructive pulmonary disease, unspecified: Secondary | ICD-10-CM | POA: Insufficient documentation

## 2014-04-29 DIAGNOSIS — Z7982 Long term (current) use of aspirin: Secondary | ICD-10-CM | POA: Diagnosis not present

## 2014-04-29 DIAGNOSIS — K317 Polyp of stomach and duodenum: Secondary | ICD-10-CM | POA: Insufficient documentation

## 2014-04-29 DIAGNOSIS — D510 Vitamin B12 deficiency anemia due to intrinsic factor deficiency: Secondary | ICD-10-CM | POA: Insufficient documentation

## 2014-04-29 DIAGNOSIS — K224 Dyskinesia of esophagus: Secondary | ICD-10-CM | POA: Diagnosis not present

## 2014-04-29 DIAGNOSIS — E079 Disorder of thyroid, unspecified: Secondary | ICD-10-CM | POA: Insufficient documentation

## 2014-04-29 DIAGNOSIS — Z885 Allergy status to narcotic agent status: Secondary | ICD-10-CM | POA: Diagnosis not present

## 2014-04-29 DIAGNOSIS — R1314 Dysphagia, pharyngoesophageal phase: Secondary | ICD-10-CM

## 2014-04-29 DIAGNOSIS — I10 Essential (primary) hypertension: Secondary | ICD-10-CM | POA: Insufficient documentation

## 2014-04-29 DIAGNOSIS — Q394 Esophageal web: Secondary | ICD-10-CM | POA: Insufficient documentation

## 2014-04-29 DIAGNOSIS — K449 Diaphragmatic hernia without obstruction or gangrene: Secondary | ICD-10-CM | POA: Insufficient documentation

## 2014-04-29 DIAGNOSIS — Z1211 Encounter for screening for malignant neoplasm of colon: Secondary | ICD-10-CM | POA: Diagnosis not present

## 2014-04-29 DIAGNOSIS — R918 Other nonspecific abnormal finding of lung field: Secondary | ICD-10-CM | POA: Insufficient documentation

## 2014-04-29 DIAGNOSIS — K222 Esophageal obstruction: Secondary | ICD-10-CM | POA: Insufficient documentation

## 2014-04-29 DIAGNOSIS — Z880 Allergy status to penicillin: Secondary | ICD-10-CM | POA: Insufficient documentation

## 2014-04-29 DIAGNOSIS — K3189 Other diseases of stomach and duodenum: Secondary | ICD-10-CM | POA: Insufficient documentation

## 2014-04-29 DIAGNOSIS — Z85118 Personal history of other malignant neoplasm of bronchus and lung: Secondary | ICD-10-CM | POA: Insufficient documentation

## 2014-04-29 HISTORY — PX: SAVORY DILATION: SHX5439

## 2014-04-29 HISTORY — PX: COLONOSCOPY: SHX5424

## 2014-04-29 HISTORY — PX: MALONEY DILATION: SHX5535

## 2014-04-29 HISTORY — PX: ESOPHAGOGASTRODUODENOSCOPY: SHX5428

## 2014-04-29 SURGERY — COLONOSCOPY
Anesthesia: Moderate Sedation

## 2014-04-29 MED ORDER — MEPERIDINE HCL 100 MG/ML IJ SOLN
INTRAMUSCULAR | Status: DC | PRN
  Administered 2014-04-29: 50 mg
  Administered 2014-04-29 (×4): 12.5 mg via INTRAVENOUS

## 2014-04-29 MED ORDER — MIDAZOLAM HCL 5 MG/5ML IJ SOLN
INTRAMUSCULAR | Status: DC | PRN
  Administered 2014-04-29 (×4): 1 mg via INTRAVENOUS
  Administered 2014-04-29: 2 mg via INTRAVENOUS
  Administered 2014-04-29: 1 mg via INTRAVENOUS
  Administered 2014-04-29: 2 mg via INTRAVENOUS
  Administered 2014-04-29 (×2): 1 mg via INTRAVENOUS

## 2014-04-29 MED ORDER — ONDANSETRON HCL 4 MG/2ML IJ SOLN
INTRAMUSCULAR | Status: DC | PRN
Start: 2014-04-29 — End: 2014-04-29
  Administered 2014-04-29: 4 mg via INTRAVENOUS

## 2014-04-29 MED ORDER — MEPERIDINE HCL 100 MG/ML IJ SOLN
INTRAMUSCULAR | Status: AC
  Filled 2014-04-29: qty 2

## 2014-04-29 MED ORDER — LIDOCAINE VISCOUS 2 % MT SOLN
OROMUCOSAL | Status: DC | PRN
  Administered 2014-04-29: 4 mL via OROMUCOSAL

## 2014-04-29 MED ORDER — MIDAZOLAM HCL 5 MG/5ML IJ SOLN
INTRAMUSCULAR | Status: AC
  Filled 2014-04-29: qty 5

## 2014-04-29 MED ORDER — LIDOCAINE VISCOUS 2 % MT SOLN
OROMUCOSAL | Status: AC
  Filled 2014-04-29: qty 15

## 2014-04-29 MED ORDER — STERILE WATER FOR IRRIGATION IR SOLN
Status: DC | PRN
  Administered 2014-04-29: 13:00:00

## 2014-04-29 MED ORDER — ONDANSETRON HCL 4 MG/2ML IJ SOLN
INTRAMUSCULAR | Status: AC
  Filled 2014-04-29: qty 2

## 2014-04-29 MED ORDER — SODIUM CHLORIDE 0.9 % IV SOLN
INTRAVENOUS | Status: DC
  Administered 2014-04-29: 12:00:00 via INTRAVENOUS

## 2014-04-29 MED ORDER — MIDAZOLAM HCL 5 MG/5ML IJ SOLN
INTRAMUSCULAR | Status: AC
  Filled 2014-04-29: qty 10

## 2014-04-29 NOTE — Interval H&P Note (Signed)
History and Physical Interval Note:  04/29/2014 12:49 PM  Staci Righter Schwan  has presented today for surgery, with the diagnosis of dysphagia, screening colonoscopy  The various methods of treatment have been discussed with the patient and family. After consideration of risks, benefits and other options for treatment, the patient has consented to  Procedure(s) with comments: COLONOSCOPY (N/A) - 1245pm ESOPHAGOGASTRODUODENOSCOPY (EGD) (N/A) SAVORY DILATION (N/A) MALONEY DILATION (N/A) as a surgical intervention .  The patient's history has been reviewed, patient examined, no change in status, stable for surgery.  I have reviewed the patient's chart and labs.  Questions were answered to the patient's satisfaction.     Sven Pinheiro  No change. EGD and colonoscopy per plan.  The risks, benefits, limitations, imponderables and alternatives regarding both EGD and colonoscopy have been reviewed with the patient. Questions have been answered. All parties agreeable.

## 2014-04-29 NOTE — Op Note (Signed)
[facility] [address] [place], [address]   COLONOSCOPY PROCEDURE REPORT  PATIENT: [name], [name]  MR#: [id number] BIRTHDATE: 28-Mar-1941 , 73  yrs. old GENDER: female ENDOSCOPIST: R.  Garfield Cornea, MD FACP [facility] REFERRED PR:XYVOP Gerarda Fraction, M.D. PROCEDURE DATE:  2014/05/01 PROCEDURE:   Colonoscopy, screening INDICATIONS:First ever average risk colorectal cancer screening examination. MEDICATIONS: Versed 11 mg IV and Demerol 100 mg IV in divided doses. Zofran 4 mg IV. ASA CLASS:       Class III  CONSENT: The risks, benefits, alternatives and imponderables including but not limited to bleeding, perforation as well as the possibility of a missed lesion have been reviewed.  The potential for biopsy, lesion removal, etc. have also been discussed. Questions have been answered.  All parties agreeable.  Please see the history and physical in the medical record for more information.  DESCRIPTION OF PROCEDURE:   After the risks benefits and alternatives of the procedure were thoroughly explained, informed consent was obtained.  The digital rectal exam revealed no abnormalities of the rectum.   The [id number] ([id number])  endoscope was introduced through the anus and advanced to the cecum, which was identified by both the appendix and ileocecal valve. No adverse events experienced.   The quality of the prep was adequate.  The instrument was then slowly withdrawn as the colon was fully examined.      COLON FINDINGS: Normal rectum.  Normal-appearing colonic mucosa. Retroflexion was performed. .  Withdrawal time=7 minutes 0 seconds.  The scope was withdrawn and the procedure completed. COMPLICATIONS: There were no immediate complications.  ENDOSCOPIC IMPRESSION: Normal colonoscopy  RECOMMENDATIONS: I do not recommend a future colonoscopy unless she develops new GI symptoms. See EGD report.  eSigned:  R. Garfield Cornea, MD Rosalita Chessman [facility] May 01, 2014 1:46 PM   cc:  CPT  CODES: ICD CODES:  The ICD and CPT codes recommended by this software are interpretations from the data that the clinical staff has captured with the software.  The verification of the translation of this report to the ICD and CPT codes and modifiers is the sole responsibility of the health care institution and practicing physician where this report was generated.  [place]. will not be held responsible for the validity of the ICD and CPT codes included on this report.  AMA assumes no liability for data contained or not contained herein. CPT is a Designer, television/film set of the [employer].  PATIENT NAME:  [name], [name] MR#: [id number]

## 2014-04-29 NOTE — Op Note (Signed)
[facility] [address] [place], [address]   ENDOSCOPY PROCEDURE REPORT  PATIENT: [name], [name]  MR#: [id number] BIRTHDATE: 10-19-1940 , 73  yrs. old GENDER: female ENDOSCOPIST: R.  Garfield Cornea, MD FACP FACG REFERRED BY:  Kerin Perna, M.D. PROCEDURE DATE:  May 20, 2014 PROCEDURE:  EGD w/ biopsy and Maloney dilation of esophagus INDICATIONS:  chronic esophageal dysphagia; abnormal barium pill esophagram. MEDICATIONS: Versed 6 mg IV and Demerol 62.5 mg IV in divided doses. Xylocaine gel orally.  Zofran 4 mg IV. ASA CLASS:      Class III  CONSENT: The risks, benefits, limitations, alternatives and imponderables have been discussed.  The potential for biopsy, esophogeal dilation, etc. have also been reviewed.  Questions have been answered.  All parties agreeable.  Please see the history and physical in the medical record for more information.  DESCRIPTION OF PROCEDURE: After the risks benefits and alternatives of the procedure were thoroughly explained, informed consent was obtained.  The [id number] ([id number]) endoscope was introduced through the mouth and advanced to the second portion of the duodenum , limited by Without limitations. The instrument was slowly withdrawn as the mucosa was fully examined.    Cervical esophageal web present which initially impeded passage of the scope; with gentle pressure on it, I pushed through the web partially disrupting it; the remainder the tubular esophagus appeared patent and normal.  Stomach empty.  Numerous 1-3 mm benign-appearing gastric polyps in the fundus and body; otherwise, the gastric mucosa appeared normal.  Patent pylorus.  Normal first and second portion of the duodenum.  Scope was removed and a 52 [place] Maloney dilator was passed to full insertion with mild to moderate resistance.  A loof back revealed the cervical web had been ruptured with minimal bleeding and without apparent complication.  Subsequently,  biopsies one of the gastric polyps taken for histologic study.  Retroflexed views revealed no abnormalities.     The scope was then withdrawn from the patient and the procedure completed.  COMPLICATIONS: There were no immediate complications.  ENDOSCOPIC IMPRESSION: Cervical esophageal web -  dilated as described above. Multiple gastric polyps?"status post biopsy  RECOMMENDATIONS: Follow-up on pathology. See colonoscopy report.  REPEAT EXAM:  eSigned:  R. Garfield Cornea, MD Rosalita Chessman [facility] 2014-05-20 1:23 PM    CC:  CPT CODES: ICD CODES:  The ICD and CPT codes recommended by this software are interpretations from the data that the clinical staff has captured with the software.  The verification of the translation of this report to the ICD and CPT codes and modifiers is the sole responsibility of the health care institution and practicing physician where this report was generated.  [place]. will not be held responsible for the validity of the ICD and CPT codes included on this report.  AMA assumes no liability for data contained or not contained herein. CPT is a Designer, television/film set of the [employer].

## 2014-04-29 NOTE — H&P (View-Only) (Signed)
Primary Care Physician:  Glo Herring., MD Primary Gastroenterologist:  Dr. Gerarda Fraction Primary GI: Dr. Gala Romney   Chief Complaint  Patient presents with  . Colonoscopy    HPI:   [name] is a 74 y.o. female presenting today at the request of Dr. Gerarda Fraction for a screening colonoscopy.   Was doing well until December when prescribed antibiotics and prednisone. Had extreme constipation with abdominal discomfort. Still feels tender in left side. States she took Miralax and got "straightened out". BM usually now every other day to twice a week. Will take Miralax as needed, which works well for her. In the past had taken Miralax and a stool softener, but this was too much. No rectal bleeding. No N/V. No reflux. Occasional solid food dysphagia. A lot of trouble with bread. BPE in 2014 with mucosal ring at cervical esophagus obstructing a 12.5 mm diameter tablet. Mild age-related esophageal dysmotility noted. Recent CT chest with incidental finding of chronic calcified pancreatitis. Ill-defined stranding around the mid aspect of the main trunk of the SMA with recommendations to correlate with abdominal symptoms. Patient is declining any further work-up for pancreatitis or abdominal imaging at this time unless clinical status changes. On 3 liters [place] continuously.   Past Medical History  Diagnosis Date  . COPD (chronic obstructive pulmonary disease)   . Thyroid disease   . Pernicious anemia   . Hypertension   . Lung cancer     Past Surgical History  Procedure Laterality Date  . Abdominal hysterectomy    . Appendectomy    . Cholecystectomy    . Tubal ligation    . Chest tube insertion  03/08/2012    Procedure: CHEST TUBE INSERTION;  Surgeon: Ivin Poot, MD;  Location: [facility];  Service: Thoracic;  Laterality: Right;  . Video assisted thoracoscopy  03/17/2012    Procedure: VIDEO ASSISTED THORACOSCOPY;  Surgeon: Ivin Poot, MD;  Location: [facility];  Service: Thoracic;  Laterality: Right;    . Resection of apical bleb  03/17/2012    Procedure: RESECTION OF APICAL BLEB;  Surgeon: Ivin Poot, MD;  Location: [facility] OR;  Service: Thoracic;  Laterality: Right;  stapling of bleb    Current Outpatient Prescriptions  Medication Sig Dispense Refill  . albuterol (PROVENTIL) (2.5 MG/3ML) 0.083% nebulizer solution Take 2.5 mg by nebulization every 4 (four) hours.     . ALPRAZolam (XANAX) 1 MG tablet Take 1 mg by mouth 4 (four) times daily as needed. For anxiety and/or sleep    . aspirin 81 MG chewable tablet Chew 81 mg by mouth every morning.    . cholecalciferol (VITAMIN D) 400 UNITS TABS tablet Take 400 Units by mouth daily.    . cyanocobalamin (,VITAMIN B-12,) 1000 MCG/ML injection Place 1,000 mcg under the tongue 3 (three) times daily.     . ferrous sulfate 325 (65 FE) MG tablet Take 325 mg by mouth 3 (three) times daily with meals.    . Fluticasone Furoate (ARNUITY ELLIPTA) 200 MCG/ACT AEPB Inhale into the lungs.    . Fluticasone-Salmeterol (ADVAIR) 250-50 MCG/DOSE AEPB Inhale 1 puff into the lungs every 12 (twelve) hours.    Marland Kitchen ibuprofen (ADVIL,MOTRIN) 200 MG tablet Take 600 mg by mouth 2 (two) times daily as needed. For pain    . levothyroxine (SYNTHROID, LEVOTHROID) 100 MCG tablet Take 100 mcg by mouth daily before breakfast. TAKE BRAND NAME ONLY    . loratadine (CLARITIN) 10 MG tablet Take 10 mg by  mouth every morning.    . NON FORMULARY OXYGEN      24/7    . NON FORMULARY Co Q 10   One tablet daily    . NON FORMULARY Magnesium 250 mg    One tablet daily    . potassium chloride SA (K-DUR,KLOR-CON) 20 MEQ tablet Take 20 mEq by mouth daily.    Marland Kitchen torsemide (DEMADEX) 20 MG tablet Take 20 mg by mouth daily.    Marland Kitchen Umeclidinium-Vilanterol (ANORO ELLIPTA) 62.5-25 MCG/INH AEPB Inhale into the lungs.     No current facility-administered medications for this visit.    Allergies as of 04/16/2014 - Review Complete 04/16/2014  Allergen Reaction Noted  . Codeine Nausea And Vomiting  11/04/2011  . Penicillins Rash 11/04/2011    Family History  Problem Relation Age of Onset  . Cancer Mother   . Heart failure Brother   . Colon cancer Neg Hx     History   Social History  . Marital Status: Married    Spouse Name: N/A    Number of Children: N/A  . Years of Education: N/A   Occupational History  . Not on file.   Social History Main Topics  . Smoking status: Former Smoker    Types: Cigarettes  . Smokeless tobacco: Former Systems developer    Quit date: 03/07/2006  . Alcohol Use: No  . Drug Use: No  . Sexual Activity: Not on file   Other Topics Concern  . Not on file   Social History Narrative    Review of Systems: Gen: Denies any fever, chills, fatigue, weight loss, lack of appetite.  CV: Denies chest pain, heart palpitations, peripheral edema, syncope.  Resp: +DOE GI: see HPI GU : rare urinary burning/frequency MS: +joint pain Derm: Denies rash, itching, dry skin Psych: Denies depression, anxiety, memory loss, and confusion Heme: Denies bruising, bleeding, and enlarged lymph nodes.  Physical Exam: BP 144/78 mmHg  Pulse 100  Temp(Src) 97.5 F (36.4 C) (Oral)  Ht 5\' 4"  (1.626 m)  Wt 142 lb (64.411 kg)  BMI 24.36 kg/m2 General:   Alert and oriented. Pleasant and cooperative. Well-nourished and well-developed.  Head:  Normocephalic and atraumatic. Eyes:  Without icterus, sclera clear and conjunctiva pink.  Ears:  Normal auditory acuity. Nose:  No deformity, discharge,  or lesions. Mouth:  No deformity or lesions, oral mucosa pink.  Lungs:  Clear to auscultation bilaterally. Diminished bases Heart:  S1, S2 present without murmurs appreciated.  Abdomen:  +BS, soft, mild TTP LUQ and non-distended. No HSM noted. No guarding or rebound. No masses appreciated.  Rectal:  Deferred  Msk:  Symmetrical without gross deformities. Normal posture. Extremities:  Chronic right lower extremity edema greater than left, negative duplex ultrasound in past Neurologic:   Alert and  oriented x 4 Psych:  Alert and cooperative. Normal mood and affect.  CT chest Dec 2015  IMPRESSION: 1. Interval increase in size of right upper lobe pulmonary nodules, the largest of which within the right lung apex measures 1.9 cm in maximal diameter, previously, 1 cm - findings worrisome for metastatic disease recurrence. Further evaluation with PET-CT could be performed as clinically indicated. 2. Sequela of chronic calcified pancreatitis. 3. Ill-defined stranding surrounds the mid aspect of the main trunk of the SMA, incompletely imaged. Correlation for abdominal symptoms is recommended. Further evaluation could be performed with dedicated abdominal CT as clinically indicated.  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 Prominent  cricopharyngeus muscle incidentally noted. Mild age-related esophageal dysmotility. Small sliding hiatal hernia.

## 2014-04-29 NOTE — Discharge Instructions (Addendum)
Colonoscopy Discharge Instructions  Read the instructions outlined below and refer to this sheet in the next few weeks. These discharge instructions provide you with general information on caring for yourself after you leave the hospital. Your doctor may also give you specific instructions. While your treatment has been planned according to the most current medical practices available, unavoidable complications occasionally occur. If you have any problems or questions after discharge, call Dr. Gala Romney at [phone]. ACTIVITY  You may resume your regular activity, but move at a slower pace for the next 24 hours.   Take frequent rest periods for the next 24 hours.   Walking will help get rid of the air and reduce the bloated feeling in your belly (abdomen).   No driving for 24 hours (because of the medicine (anesthesia) used during the test).    Do not sign any important legal documents or operate any machinery for 24 hours (because of the anesthesia used during the test).  NUTRITION  Drink plenty of fluids.   You may resume your normal diet as instructed by your doctor.   Begin with a light meal and progress to your normal diet. Heavy or fried foods are harder to digest and may make you feel sick to your stomach (nauseated).   Avoid alcoholic beverages for 24 hours or as instructed.  MEDICATIONS  You may resume your normal medications unless your doctor tells you otherwise.  WHAT YOU CAN EXPECT TODAY  Some feelings of bloating in the abdomen.   Passage of more gas than usual.   Spotting of blood in your stool or on the toilet paper.  IF YOU HAD POLYPS REMOVED DURING THE COLONOSCOPY:  No aspirin products for 7 days or as instructed.   No alcohol for 7 days or as instructed.   Eat a soft diet for the next 24 hours.  FINDING OUT THE RESULTS OF YOUR TEST Not all test results are available during your visit. If your test results are not back during the visit, make an appointment  with your caregiver to find out the results. Do not assume everything is normal if you have not heard from your caregiver or the medical facility. It is important for you to follow up on all of your test results.  SEEK IMMEDIATE MEDICAL ATTENTION IF:  You have more than a spotting of blood in your stool.   Your belly is swollen (abdominal distention).   You are nauseated or vomiting.   You have a temperature over 101.  You have abdominal pain or discomfort that is severe or gets worse throughout the day. EGD Discharge instructions Please read the instructions outlined below and refer to this sheet in the next few weeks. These discharge instructions provide you with general information on caring for yourself after you leave the hospital. Your doctor may also give you specific instructions. While your treatment has been planned according to the most current medical practices available, unavoidable complications occasionally occur. If you have any problems or questions after discharge, please call your doctor. ACTIVITY You may resume your regular activity but move at a slower pace for the next 24 hours.  Take frequent rest periods for the next 24 hours.  Walking will help expel (get rid of) the air and reduce the bloated feeling in your abdomen.  No driving for 24 hours (because of the anesthesia (medicine) used during the test).  You may shower.  Do not sign any important legal documents or operate any machinery for 24  hours (because of the anesthesia used during the test).  NUTRITION Drink plenty of fluids.  You may resume your normal diet.  Begin with a light meal and progress to your normal diet.  Avoid alcoholic beverages for 24 hours or as instructed by your caregiver.  MEDICATIONS You may resume your normal medications unless your caregiver tells you otherwise.  WHAT YOU CAN EXPECT TODAY You may experience abdominal discomfort such as a feeling of fullness or gas pains.   FOLLOW-UP Your doctor will discuss the results of your test with you.  SEEK IMMEDIATE MEDICAL ATTENTION IF ANY OF THE FOLLOWING OCCUR: Excessive nausea (feeling sick to your stomach) and/or vomiting.  Severe abdominal pain and distention (swelling).  Trouble swallowing.  Temperature over 101 F (37.8 C).  Rectal bleeding or vomiting of blood.    Chief foods thoroughly; have liquids on hand to assist with swallowing. Take 30 minutes to eat a meal  Further recommendations to follow pending review of pathology report

## 2014-04-30 ENCOUNTER — Encounter (HOSPITAL_COMMUNITY): Payer: Self-pay | Admitting: Internal Medicine

## 2014-04-30 ENCOUNTER — Other Ambulatory Visit (HOSPITAL_COMMUNITY): Payer: Self-pay | Admitting: Nephrology

## 2014-04-30 DIAGNOSIS — N183 Chronic kidney disease, stage 3 unspecified: Secondary | ICD-10-CM

## 2014-05-01 ENCOUNTER — Encounter: Payer: Self-pay | Admitting: Internal Medicine

## 2014-05-02 ENCOUNTER — Other Ambulatory Visit: Payer: Self-pay | Admitting: Radiology

## 2014-05-06 ENCOUNTER — Ambulatory Visit (HOSPITAL_COMMUNITY)
Admission: RE | Admit: 2014-05-06 | Discharge: 2014-05-06 | Disposition: A | Discharge status: Home / Self Care | Payer: PPO | Source: Ambulatory Visit | Attending: Interventional Radiology | Admitting: Interventional Radiology

## 2014-05-06 ENCOUNTER — Ambulatory Visit (HOSPITAL_COMMUNITY)
Admission: RE | Admit: 2014-05-06 | Discharge: 2014-05-06 | Disposition: A | Discharge status: Home / Self Care | Payer: PPO | Source: Ambulatory Visit | Attending: Cardiothoracic Surgery | Admitting: Cardiothoracic Surgery

## 2014-05-06 ENCOUNTER — Encounter (HOSPITAL_COMMUNITY): Payer: Self-pay

## 2014-05-06 DIAGNOSIS — Z85118 Personal history of other malignant neoplasm of bronchus and lung: Secondary | ICD-10-CM | POA: Diagnosis not present

## 2014-05-06 DIAGNOSIS — Z9889 Other specified postprocedural states: Secondary | ICD-10-CM | POA: Insufficient documentation

## 2014-05-06 DIAGNOSIS — Z87891 Personal history of nicotine dependence: Secondary | ICD-10-CM | POA: Diagnosis not present

## 2014-05-06 DIAGNOSIS — C3490 Malignant neoplasm of unspecified part of unspecified bronchus or lung: Secondary | ICD-10-CM

## 2014-05-06 DIAGNOSIS — R911 Solitary pulmonary nodule: Secondary | ICD-10-CM | POA: Diagnosis present

## 2014-05-06 LAB — CBC
HCT: 30.1 % — ABNORMAL LOW (ref 36.0–46.0)
Hemoglobin: 9.5 g/dL — ABNORMAL LOW (ref 12.0–15.0)
MCH: 28.1 pg (ref 26.0–34.0)
MCHC: 31.6 g/dL (ref 30.0–36.0)
MCV: 89.1 fL (ref 78.0–100.0)
Platelets: 238 10*3/uL (ref 150–400)
RBC: 3.38 MIL/uL — ABNORMAL LOW (ref 3.87–5.11)
RDW: 14.7 % (ref 11.5–15.5)
WBC: 3.7 10*3/uL — ABNORMAL LOW (ref 4.0–10.5)

## 2014-05-06 LAB — APTT: aPTT: 37 seconds (ref 24–37)

## 2014-05-06 LAB — PROTIME-INR
INR: 0.97 (ref 0.00–1.49)
Prothrombin Time: 13 seconds (ref 11.6–15.2)

## 2014-05-06 MED ORDER — SODIUM CHLORIDE 0.9 % IV SOLN
INTRAVENOUS | Status: DC
  Administered 2014-05-06: 10:00:00 via INTRAVENOUS

## 2014-05-06 MED ORDER — FENTANYL CITRATE 0.05 MG/ML IJ SOLN
INTRAMUSCULAR | Status: AC
  Filled 2014-05-06: qty 2

## 2014-05-06 MED ORDER — MIDAZOLAM HCL 2 MG/2ML IJ SOLN
INTRAMUSCULAR | Status: AC | PRN
  Administered 2014-05-06: 1 mg via INTRAVENOUS

## 2014-05-06 MED ORDER — FENTANYL CITRATE 0.05 MG/ML IJ SOLN
INTRAMUSCULAR | Status: AC | PRN
  Administered 2014-05-06: 50 ug via INTRAVENOUS

## 2014-05-06 MED ORDER — MIDAZOLAM HCL 2 MG/2ML IJ SOLN
INTRAMUSCULAR | Status: AC
  Filled 2014-05-06: qty 2

## 2014-05-06 MED ORDER — LIDOCAINE HCL 1 % IJ SOLN
INTRAMUSCULAR | Status: AC
  Filled 2014-05-06: qty 20

## 2014-05-06 NOTE — Sedation Documentation (Signed)
MD spoke to pt- bx went well. Will transfer pt to Short Stay

## 2014-05-06 NOTE — H&P (Signed)
Chief Complaint: "I am here for a lung biopsy."  Referring Physician(s): Lucianne Lei Trigt,Peter  History of Present Illness: [name] is a 74 y.o. 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 Recent imaging revealed enlarging right upper lobe lung mass, she has had a previous biopsy RLL 11/2012 with no malignancy seen. She has been seen by Dr. Prescott Gum in the office on 04/17/14 and scheduled today for image guided RUL lung mass biopsy. The patient has had a H&P performed within the last 30 days, all history, medications, and exam have been reviewed. The patient denies any interval changes since the H&P. She denies any chest pain, worsening shortness of breath or palpitations. She denies any active signs of bleeding or excessive bruising. She denies any recent fever or chills. The patient does use chronic oxygen at 3L. She has previously tolerated sedation without complications. She also c/o mid back pain 7/10 x 1 month with no known injury or recent imaging, she has been taking tylenol for pain control.    Past Medical History  Diagnosis Date  . COPD (chronic obstructive pulmonary disease)   . Thyroid disease   . Pernicious anemia   . Hypertension   . Lung cancer     Past Surgical History  Procedure Laterality Date  . Abdominal hysterectomy    . Appendectomy    . Cholecystectomy    . Tubal ligation    . Chest tube insertion  03/08/2012    Procedure: CHEST TUBE INSERTION;  Surgeon: Ivin Poot, MD;  Location: [facility];  Service: Thoracic;  Laterality: Right;  . Video assisted thoracoscopy  03/17/2012    Procedure: VIDEO ASSISTED THORACOSCOPY;  Surgeon: Ivin Poot, MD;  Location: [facility];  Service: Thoracic;  Laterality: Right;  . Resection of apical bleb  03/17/2012    Procedure: RESECTION OF APICAL BLEB;  Surgeon: Ivin Poot, MD;   Location: [facility];  Service: Thoracic;  Laterality: Right;  stapling of bleb  . Colonoscopy N/A 04/29/2014    Procedure: COLONOSCOPY;  Surgeon: Daneil Dolin, MD;  [facility];  Service: Endoscopy;  Laterality: N/A;  1245pm  . Esophagogastroduodenoscopy N/A 04/29/2014    Procedure: ESOPHAGOGASTRODUODENOSCOPY (EGD);  Surgeon: Daneil Dolin, MD;  [facility];  Service: Endoscopy;  Laterality: N/A;  . Savory dilation N/A 04/29/2014    Procedure: SAVORY DILATION;  Surgeon: Daneil Dolin, MD;  [facility];  Service: Endoscopy;  Laterality: N/A;  Venia Minks dilation N/A 04/29/2014    Procedure: Venia Minks DILATION;  Surgeon: Daneil Dolin, MD;  [facility];  Service: Endoscopy;  Laterality: N/A;    Allergies: Codeine and Penicillins  Medications: Prior to Admission medications   Medication Sig Start Date End Date Taking? Authorizing Provider  acetaminophen (TYLENOL) 500 MG tablet Take 500 mg by mouth every 6 (six) hours as needed.   Yes Historical Provider, MD  albuterol (PROVENTIL) (2.5 MG/3ML) 0.083% nebulizer solution Take 2.5 mg by nebulization every 4 (four) hours.    Yes Historical Provider, MD  ALPRAZolam Duanne Moron) 1 MG tablet Take 1 mg by mouth 4 (four) times daily as needed. For anxiety and/or sleep   Yes Historical Provider, MD  aspirin 81 MG chewable tablet Chew 81 mg by mouth every morning.   Yes Historical Provider, MD  ferrous sulfate 325 (65 FE) MG tablet Take 325 mg by mouth 3 (three) times daily with meals.   Yes Historical Provider, MD  Fluticasone-Salmeterol (ADVAIR) 250-50 MCG/DOSE AEPB Inhale 1 puff into the lungs every 12 (twelve) hours.   Yes Historical Provider, MD  guaiFENesin (MUCINEX) 600 MG 12 hr tablet Take 600 mg by mouth daily as needed for cough.    Yes Historical Provider, MD  ibuprofen (ADVIL,MOTRIN) 200 MG tablet Take 600 mg by mouth 2 (two) times daily as needed. For pain   Yes Historical Provider, MD  levothyroxine  (SYNTHROID, LEVOTHROID) 100 MCG tablet Take 100 mcg by mouth daily before breakfast.    Yes Historical Provider, MD  loratadine (CLARITIN) 10 MG tablet Take 10 mg by mouth every morning.   Yes Historical Provider, MD  NON FORMULARY OXYGEN      24/7   Yes Historical Provider, MD  NON FORMULARY Co Q 10   One tablet daily   Yes Historical Provider, MD  NON FORMULARY Magnesium 250 mg    One tablet daily   Yes Historical Provider, MD  potassium chloride SA (K-DUR,KLOR-CON) 20 MEQ tablet Take 20 mEq by mouth daily.   Yes Historical Provider, MD  torsemide (DEMADEX) 20 MG tablet Take 10 mg by mouth daily.    Yes Historical Provider, MD  vitamin B-12 (CYANOCOBALAMIN) 1000 MCG tablet Take 1,000 mcg by mouth daily.   Yes Historical Provider, MD  Vitamin D, Ergocalciferol, (DRISDOL) 50000 UNITS CAPS capsule Take 50,000 Units by mouth every 7 (seven) days. Fridays   Yes Historical Provider, MD  peg 3350 powder (MOVIPREP) 100 G SOLR Take 1 kit (200 g total) by mouth once. Patient not taking: Reported on 05/03/2014 04/18/14 05/18/14  Orvil Feil, NP  polyethylene glycol-electrolytes (TRILYTE) 420 G solution Take 4,000 mLs by mouth as directed. Patient not taking: Reported on 05/03/2014 04/19/14   Daneil Dolin, MD    Family History  Problem Relation Age of Onset  . Cancer Mother   . Heart failure Brother   . Colon cancer Neg Hx     History   Social History  . Marital Status: Married    Spouse Name: N/A    Number of Children: N/A  . Years of Education: N/A   Social History Main Topics  . Smoking status: Former Smoker    Types: Cigarettes  . Smokeless tobacco: Former Systems developer    Quit date: 03/07/2006  . Alcohol Use: No  . Drug Use: No  . Sexual Activity: None   Other Topics Concern  . None   Social History Narrative   Review of Systems: A 12 point ROS discussed and pertinent positives are indicated in the HPI above.  All other systems are negative.  Review of Systems  Vital Signs: BP 144/74  mmHg  Pulse 81  Temp(Src) 97.6 F (36.4 C) (Oral)  Resp 18  Ht 5' 4"  (1.626 m)  Wt 135 lb (61.236 kg)  BMI 23.16 kg/m2  SpO2 100%  Physical Exam  Constitutional: She is oriented to person, place, and time. No distress.  HENT:  Head: Normocephalic and atraumatic.  Neck: No tracheal deviation present.  Cardiovascular: Normal rate and regular rhythm.  Exam reveals no gallop and no friction rub.   No murmur heard. Pulmonary/Chest: Effort normal and breath sounds normal. No respiratory distress. She has no wheezes.  Abdominal: Soft. Bowel sounds are normal. She exhibits no distension. There is no tenderness.  Neurological: She is alert and oriented  to person, place, and time.  Skin: She is not diaphoretic.   Imaging: Dg Chest 2 View  04/17/2014   CLINICAL DATA:  For most history of right lung carcinoma, chronic shortness of breath intermittent chest pain  EXAM: CHEST  2 VIEW  COMPARISON:  CT chest of 03/27/2014 and chest x-ray of 10/08/2013  FINDINGS: A line of sutures is noted in the right mid upper lung field which appears stable. However, compared to prior lateral view of the chest as well as sagittal images from the CT chest, the rounded nodule noted within the right upper lobe medially positioned may be somewhat larger, worrisome for recurrence of lung carcinoma. This nodular lesion may be overlying the anterior right first rib and the right paratracheal region on the frontal view. If more definitive assessment is necessary repeat CT of the chest would be helpful to a evaluate interval change in size. The lungs are hyperaerated consistent with emphysema. Mild cardiomegaly is stable. No acute bony abnormality is seen.  IMPRESSION: 1. Possible increase in size of the medial right upper lobe nodule described by CT worrisome for recurrence of lung carcinoma. Consider CT of the chest. 2. Emphysema. 3. Stable cardiomegaly.   Electronically Signed   By: Ivar Drape M.D.   On: 04/17/2014 14:17    Labs:  CBC:  Recent Labs  05/06/14 0930  WBC 3.7*  HGB 9.5*  HCT 30.1*  PLT 238   COAGS: No results for input(s): INR, APTT in the last 8760 hours.  Assessment and Plan: Right upper lobe lung mass enlarging, previous biopsy RLL 11/2012 no malignancy seen.  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  Seen by Dr. Prescott Gum in the office on 04/17/14 Scheduled today for image guided RUL lung mass biopsy with moderate sedation Patient has been NPO, no blood thinners taken, labs reviewed Risks and Benefits discussed with the patient including, but not limited to bleeding, infection, pneumothorax or even death . All of the patient's questions were answered, patient is agreeable to proceed. Consent signed and in chart.   SignedHedy Jacob 05/06/2014, 10:06 AM

## 2014-05-06 NOTE — Discharge Instructions (Signed)
Needle Biopsy of Lung, Care After °Refer to this sheet in the next few weeks. These instructions provide you with information on caring for yourself after your procedure. Your health care provider may also give you more specific instructions. Your treatment has been planned according to current medical practices, but problems sometimes occur. Call your health care provider if you have any problems or questions after your procedure. °WHAT TO EXPECT AFTER THE PROCEDURE °· A bandage will be applied over the area where the needle was inserted. You may be asked to apply pressure to the bandage for several minutes to ensure there is minimal bleeding. °· In most cases, you can leave when your needle biopsy procedure is completed. Do not drive yourself home. Someone else should take you home. °· If you received an IV sedative or general anesthetic, you will be taken to a comfortable place to relax while the medicine wears off. °· If you have upcoming travel scheduled, talk to your health care provider about when it is safe to travel by air after the procedure. °HOME CARE INSTRUCTIONS °· Expect to take it easy for the rest of the day. °· Protect the area where you received the needle biopsy by keeping the bandage in place for as long as instructed. °· You may feel some mild pain or discomfort in the area, but this should stop in a day or two. °· Take medicines only as directed by your health care provider. °SEEK MEDICAL CARE IF:  °· You have pain at the biopsy site that worsens or is not helped by medicine. °· You have swelling or drainage at the needle biopsy site. °· You have a fever. °SEEK IMMEDIATE MEDICAL CARE IF:  °· You have new or worsening shortness of breath. °· You have chest pain. °· You are coughing up blood. °· You have bleeding that does not stop with pressure or a bandage. °· You develop light-headedness or fainting. °Document Released: 01/17/2007 Document Revised: 08/06/2013 Document Reviewed:  08/14/2012 °Exit[employer]® Patient Information ©2015 [employer]. This information is not intended to replace advice given to you by your health care provider. Make sure you discuss any questions you have with your health care provider. ° °

## 2014-05-06 NOTE — Sedation Documentation (Signed)
R upper back area- bandaid dry, intact. Coughed up small bit of hemoptysis; will keep pt lying on R side down for 10 min. Per MD. Sats stable. Get chest xray in 1 hr.

## 2014-05-06 NOTE — Procedures (Signed)
Successful CT GUIDED RUL NODULE CORE BX NO COMP STABLE PATH PENDING FULL REPORT IN PACS

## 2014-05-08 ENCOUNTER — Ambulatory Visit: Payer: PPO | Admitting: Cardiothoracic Surgery

## 2014-05-13 ENCOUNTER — Telehealth: Payer: Self-pay | Admitting: *Deleted

## 2014-05-13 ENCOUNTER — Other Ambulatory Visit: Payer: Self-pay | Admitting: *Deleted

## 2014-05-13 DIAGNOSIS — C3410 Malignant neoplasm of upper lobe, unspecified bronchus or lung: Secondary | ICD-10-CM

## 2014-05-13 NOTE — Telephone Encounter (Signed)
Patient called and set up for thoracic clinic on 05/16/14 arrival at 12:30

## 2014-05-14 ENCOUNTER — Telehealth: Payer: Self-pay | Admitting: Physical Therapy

## 2014-05-15 ENCOUNTER — Ambulatory Visit: Payer: PPO | Admitting: Cardiothoracic Surgery

## 2014-05-15 ENCOUNTER — Encounter (HOSPITAL_COMMUNITY): Payer: Self-pay

## 2014-05-16 ENCOUNTER — Other Ambulatory Visit (HOSPITAL_BASED_OUTPATIENT_CLINIC_OR_DEPARTMENT_OTHER): Payer: PPO

## 2014-05-16 ENCOUNTER — Encounter: Payer: Self-pay | Admitting: *Deleted

## 2014-05-16 ENCOUNTER — Encounter: Payer: Self-pay | Admitting: Internal Medicine

## 2014-05-16 ENCOUNTER — Telehealth: Payer: Self-pay | Admitting: Internal Medicine

## 2014-05-16 ENCOUNTER — Ambulatory Visit (HOSPITAL_BASED_OUTPATIENT_CLINIC_OR_DEPARTMENT_OTHER): Payer: PPO | Admitting: Internal Medicine

## 2014-05-16 ENCOUNTER — Ambulatory Visit: Payer: PPO | Attending: Internal Medicine | Admitting: Physical Therapy

## 2014-05-16 VITALS — BP 182/52 | HR 88 | Temp 98.1°F | Resp 18 | Ht 64.0 in | Wt 136.7 lb

## 2014-05-16 DIAGNOSIS — C3410 Malignant neoplasm of upper lobe, unspecified bronchus or lung: Secondary | ICD-10-CM

## 2014-05-16 DIAGNOSIS — C3411 Malignant neoplasm of upper lobe, right bronchus or lung: Secondary | ICD-10-CM | POA: Diagnosis not present

## 2014-05-16 DIAGNOSIS — Z7409 Other reduced mobility: Secondary | ICD-10-CM | POA: Insufficient documentation

## 2014-05-16 DIAGNOSIS — M4004 Postural kyphosis, thoracic region: Secondary | ICD-10-CM | POA: Insufficient documentation

## 2014-05-16 LAB — COMPREHENSIVE METABOLIC PANEL (CC13)
ALT: 16 U/L (ref 0–55)
AST: 35 U/L — ABNORMAL HIGH (ref 5–34)
Albumin: 3.7 g/dL (ref 3.5–5.0)
Alkaline Phosphatase: 206 U/L — ABNORMAL HIGH (ref 40–150)
Anion Gap: 11 mEq/L (ref 3–11)
BUN: 15.4 mg/dL (ref 7.0–26.0)
CALCIUM: 8.8 mg/dL (ref 8.4–10.4)
CHLORIDE: 105 meq/L (ref 98–109)
CO2: 26 meq/L (ref 22–29)
CREATININE: 0.9 mg/dL (ref 0.6–1.1)
EGFR: 61 mL/min/{1.73_m2} — AB (ref >90)
GLUCOSE: 99 mg/dL (ref 70–140)
Potassium: 4.4 mEq/L (ref 3.5–5.1)
Sodium: 141 mEq/L (ref 136–145)
Total Bilirubin: 0.55 mg/dL (ref 0.20–1.20)
Total Protein: 6.3 g/dL — ABNORMAL LOW (ref 6.4–8.3)

## 2014-05-16 LAB — CBC WITH DIFFERENTIAL/PLATELET
BASO%: 0.9 % (ref 0.0–2.0)
Basophils Absolute: 0 10*3/uL (ref 0.0–0.1)
EOS%: 10.8 % — AB (ref 0.0–7.0)
Eosinophils Absolute: 0.5 10*3/uL (ref 0.0–0.5)
HCT: 29.5 % — ABNORMAL LOW (ref 34.8–46.6)
HGB: 9.3 g/dL — ABNORMAL LOW (ref 11.6–15.9)
LYMPH%: 31.5 % (ref 14.0–49.7)
MCH: 27.8 pg (ref 25.1–34.0)
MCHC: 31.5 g/dL (ref 31.5–36.0)
MCV: 88.1 fL (ref 79.5–101.0)
MONO#: 0.4 10*3/uL (ref 0.1–0.9)
MONO%: 8.1 % (ref 0.0–14.0)
NEUT#: 2.1 10*3/uL (ref 1.5–6.5)
NEUT%: 48.7 % (ref 38.4–76.8)
PLATELETS: 239 10*3/uL (ref 145–400)
RBC: 3.34 10*6/uL — AB (ref 3.70–5.45)
RDW: 14.5 % (ref 11.2–14.5)
WBC: 4.4 10*3/uL (ref 3.9–10.3)
lymph#: 1.4 10*3/uL (ref 0.9–3.3)

## 2014-05-16 MED ORDER — ERLOTINIB HCL 150 MG PO TABS: 150.0000 mg | ORAL_TABLET | Freq: Every day | ORAL | Status: DC

## 2014-05-16 NOTE — Therapy (Signed)
[place], [place], [address] Phone: [phone]   Fax:  [phone]  Physical Therapy Evaluation  Patient Details  Name: [name] MRN: [id number] Date of Birth: 1941/02/04 Referring Provider:  Curt Bears, MD  Encounter Date: 05/16/2014      PT End of Session - 05/16/14 1641    Visit Number 1   Number of Visits 1   Date for PT Re-Evaluation 07/14/14   PT Start Time 1325   PT Stop Time 1345   PT Time Calculation (min) 20 min   Equipment Utilized During Treatment Oxygen      Past Medical History  Diagnosis Date  . COPD (chronic obstructive pulmonary disease)   . Thyroid disease   . Pernicious anemia   . Hypertension   . Lung cancer     Past Surgical History  Procedure Laterality Date  . Abdominal hysterectomy    . Appendectomy    . Cholecystectomy    . Tubal ligation    . Chest tube insertion  03/08/2012    Procedure: CHEST TUBE INSERTION;  Surgeon: Ivin Poot, MD;  Location: [facility];  Service: Thoracic;  Laterality: Right;  . Video assisted thoracoscopy  03/17/2012    Procedure: VIDEO ASSISTED THORACOSCOPY;  Surgeon: Ivin Poot, MD;  Location: [facility];  Service: Thoracic;  Laterality: Right;  . Resection of apical bleb  03/17/2012    Procedure: RESECTION OF APICAL BLEB;  Surgeon: Ivin Poot, MD;  Location: [facility];  Service: Thoracic;  Laterality: Right;  stapling of bleb  . Colonoscopy N/A 04/29/2014    Procedure: COLONOSCOPY;  Surgeon: Daneil Dolin, MD;  [facility];  Service: Endoscopy;  Laterality: N/A;  1245pm  . Esophagogastroduodenoscopy N/A 04/29/2014    Procedure: ESOPHAGOGASTRODUODENOSCOPY (EGD);  Surgeon: Daneil Dolin, MD;  [facility];  Service: Endoscopy;  Laterality: N/A;  . Savory dilation N/A 04/29/2014    Procedure: SAVORY DILATION;  Surgeon: Daneil Dolin, MD;  [facility];  Service: Endoscopy;  Laterality: N/A;  Venia Minks dilation N/A 04/29/2014    Procedure: Venia Minks DILATION;  Surgeon: Daneil Dolin, MD;  [facility];  Service: Endoscopy;  Laterality: N/A;    There were no vitals taken for this visit.  Visit Diagnosis:  Postural kyphosis of thoracic region - Plan: PT plan of care cert/re-cert  Impaired functional mobility, balance, gait, and endurance - Plan: PT plan of care cert/re-cert      Subjective Assessment - 05/16/14 1628    Symptoms Has mid-backpain; on 3L O2 fulltime; gets tired late in the day   Pertinent History Pt. with h/o adenocarcinoma of the lung s/p resection 03/2012, now with apparent recurrence noted on x-ray and confirmed with CT biopsy.  Ex-smoker who quit 2007; COPD and on home O2.   Currently in Pain? No/denies  but has mid-back pain up to 10/10   Pain Score 0-No pain   Pain Location Back   Pain Orientation Mid   Pain Onset More than a month ago   Aggravating Factors  Possibly sitting too long.   Pain Relieving Factors tylenol   Multiple Pain Sites No          [facility] PT Assessment - 05/16/14 0001    Assessment   Medical Diagnosis right upper lobe adenocarcinoma   Precautions   Precautions Other (comment)  cancer precautions   Restrictions   Weight Bearing Restrictions No  Balance Screen   Has the patient fallen in the past 6 months No   Has the patient had a decrease in activity level because of a fear of falling?  No   Is the patient reluctant to leave their home because of a fear of falling?  No   Home Environment   Living Enviornment Private residence   Living Arrangements Children  daughter and son-in-law   Type of [place] to enter   Entrance Stairs-Number of Steps [place]  but patient doesn't have to go [place] - single point;Walker - standard  she owns these but hasn't used them to date   Prior Function   Level of Independence Needs assistance with homemaking    Observation/Other Assessments   Observations thin, frail looking older woman on O2   Sensation   Light Touch Not tested  reports episodic discomfort in feet   Posture/Postural Control   Posture/Postural Control Postural limitations   Postural Limitations Forward head;Increased thoracic kyphosis   AROM   Overall AROM Comments trunk AROM in standing mildly limited in all directions except extension, which shows a 75% loss of motion  is slightly SOB with performing this in standing   Ambulation/Gait   Ambulation/Gait Yes   Ambulation/Gait Assistance 6: Modified independent (Device/Increase time)  On O2; has 50 foot cord for O2 to move around the house   Balance   Balance Assessed Yes   Dynamic Standing Balance   Dynamic Standing - Comments reaches 9 inches forward in standing                          PT Education - 05-17-14 1641    Education provided Yes   Education Details posture, breathing, energy conservation, walking   Person(s) Educated Patient;Child(ren)   Methods Explanation;Handout   Comprehension Verbalized understanding               Lung Clinic Goals - 2014-05-17 1645    Patient will be able to verbalize understanding of the benefit of exercise to decrease fatigue.   Status Achieved   Patient will be able to verbalize the importance of posture.   Status Achieved   Patient will be able to demonstrate diaphragmatic breathing for improved lung function.   Status Achieved   Patient will be able to verbalize understanding of the role of physical therapy to prevent functional decline and who to contact if physical therapy is needed.   Status Achieved             Plan - 2014-05-17 1642    Clinical Impression Statement Patient with h/o lung cancer and on oxygen now with recurrence; has back pain, posture dysfunction, and limitations in gait.   Pt will benefit from skilled therapeutic intervention in order to improve on the following deficits  Pain;Decreased endurance;Decreased mobility   Rehab Potential Fair   PT Frequency One time visit   PT Treatment/Interventions Patient/family education   PT Next Visit Plan None at this time; patient may benefit from therapy going forward for mid-back pain and endurance.   Consulted and Agree with Plan of Care Patient          G-Codes - 05-17-14 1646    Functional Assessment Tool Used clinical judgement   Functional Limitation Mobility: Walking and moving around   Mobility: Walking and Moving Around Current Status ([id number]) At least 40 percent but  less than 60 percent impaired, limited or restricted   Mobility: Walking and Moving Around Goal Status [phone]) At least 40 percent but less than 60 percent impaired, limited or restricted   Mobility: Walking and Moving Around Discharge Status [phone]) At least 40 percent but less than 60 percent impaired, limited or restricted       Problem List Patient Active Problem List   Diagnosis Date Noted  . Mucosal abnormality of stomach   . Esophageal ring   . Special screening for malignant neoplasms, colon   . Dysphagia, pharyngoesophageal phase 04/19/2014  . Encounter for screening colonoscopy 04/19/2014  . Spontaneous pneumothorax 05/10/2012  . Lung cancer, upper lobe 05/10/2012    [name] 05/16/2014, 4:48 PM  [place], [place], [address] Phone: [phone]   Fax:  [phone]  Serafina Royals, [place]

## 2014-05-16 NOTE — Progress Notes (Signed)
[name]   Fax:([phone]) [phone] Multidisciplinary thoracic oncology clinic  CONSULT NOTE  REFERRING PHYSICIAN: Dr. Tharon Aquas Trigt  REASON FOR CONSULTATION:  74 years old white female with recurrent lung cancer.  HPI [name] is a 74 y.o. female with past medical history significant for COPD, hypertension, anemia, thyroid disease as well as history of dysphagia status post esophageal dilatation. In December 2013 the patient had evidence for pneumomediastinum and worsening right lateral chest wall as well as bilateral supraclavicular subcutaneous emphysema seen on chest x-ray. CT angiogram of the chest on 03/14/2012 showed large poor right chest tube placement with markedly improved right pneumothorax and large amount of soft tissue emphysema and the supraclavicular region, neck and over the right chest wall as well as large amount of pneumomediastinum. There was also 2 worrisome lesions in the right upper lobe concerning for bronchogenic carcinoma. On 03/17/2012, he underwent right VATS with resection of right upper lobe clips as well as wedge resection of the right upper lobe nodule in addition to pleurodesis of the right hemothorax under the care of Dr. Prescott Gum. The final pathology (Accession #: [id number]) showed invasive well to moderately differentiated adenocarcinoma measuring 2.1 cm in size with tumor invading the visceral pleura. The tissue blocks were sent for molecular studies and it showed positive EGFR mutation in exon 21 (L858R), but negative for an gene translocation. The patient was followed by observation. Repeat CT scan of the chest on 03/27/2014 showed Interval enlargement of two right upper lobe pulmonary nodules withindex nodule within the right lung apex now measuring 1.8 x 1.9 cm, previously, 1.0 x 0.9 cm and additional right upper lobe nodule measuring 1.3 cm in diameter,previously, 0.3 cm in diameter, findings worrisome for  recurrent disease. Punctate (approximately 3 mm) nodule within the left lower lobe is unchanged. These findings were worrisome for metastatic disease recurrence.  On 05/06/2014 the patient underwent CT-guided core biopsy of the right upper lobe lung nodule by interventional radiology. The final pathology (Accession: [id number])  was positive for adenocarcinoma. The adenocarcinoma is compared to the previous case ([id number]) and the tumors are morphologically identical. Dr. Prescott Gum kindly referred the patient to me today for further evaluation and recommendation regarding treatment of her condition. When seen today the patient is feeling fine except for low back ache as well as shortness of breath at baseline and she is currently on home oxygen. She also has mild cough productive of whitish sputum. She denied having any significant chest pain or hemoptysis. The patient denied having any significant weight loss or night sweats. She has mild headache and mild nausea with no visual changes. Her family history significant for mother who died from brain tumor at age 20.  The patient is married and has 4 children. She was accompanied today by HER-2 daughters Jeani Hawking and Butch Penny. She used to work in Scient[employer], research (medical). She has a history of smoking more than one pack per day for around 45 years and quit in 2008. She has no history of alcohol or drug abuse.  HPI  Past Medical History  Diagnosis Date  . COPD (chronic obstructive pulmonary disease)   . Thyroid disease   . Pernicious anemia   . Hypertension   . Lung cancer     Past Surgical History  Procedure Laterality Date  . Abdominal hysterectomy    . Appendectomy    . Cholecystectomy    . Tubal ligation    . Chest tube insertion  03/08/2012    Procedure: CHEST TUBE INSERTION;  Surgeon: Ivin Poot, MD;  Location: [facility];  Service: Thoracic;  Laterality: Right;  . Video assisted thoracoscopy  03/17/2012    Procedure: VIDEO ASSISTED THORACOSCOPY;  Surgeon:  Ivin Poot, MD;  Location: [facility];  Service: Thoracic;  Laterality: Right;  . Resection of apical bleb  03/17/2012    Procedure: RESECTION OF APICAL BLEB;  Surgeon: Ivin Poot, MD;  Location: [place] [facility];  Service: Thoracic;  Laterality: Right;  stapling of bleb  . Colonoscopy N/A 04/29/2014    Procedure: COLONOSCOPY;  Surgeon: Daneil Dolin, MD;  [facility];  Service: Endoscopy;  Laterality: N/A;  1245pm  . Esophagogastroduodenoscopy N/A 04/29/2014    Procedure: ESOPHAGOGASTRODUODENOSCOPY (EGD);  Surgeon: Daneil Dolin, MD;  [facility];  Service: Endoscopy;  Laterality: N/A;  . Savory dilation N/A 04/29/2014    Procedure: SAVORY DILATION;  Surgeon: Daneil Dolin, MD;  [facility];  Service: Endoscopy;  Laterality: N/A;  Venia Minks dilation N/A 04/29/2014    Procedure: Venia Minks DILATION;  Surgeon: Daneil Dolin, MD;  [facility];  Service: Endoscopy;  Laterality: N/A;    Family History  Problem Relation Age of Onset  . Cancer Mother   . Heart failure Brother   . Colon cancer Neg Hx     Social History History  Substance Use Topics  . Smoking status: Former Smoker    Types: Cigarettes    Quit date: 04/05/2006  . Smokeless tobacco: Former Systems developer    Quit date: 03/07/2006  . Alcohol Use: No    Allergies  Allergen Reactions  . Codeine Nausea And Vomiting    Projectile vomiting  . Penicillins Rash    Tolerates Rocephine    Current Outpatient Prescriptions  Medication Sig Dispense Refill  . acetaminophen (TYLENOL) 500 MG tablet Take 500 mg by mouth every 6 (six) hours as needed.    Marland Kitchen albuterol (PROVENTIL) (2.5 MG/3ML) 0.083% nebulizer solution Take 2.5 mg by nebulization every 4 (four) hours.     . ALPRAZolam (XANAX) 1 MG tablet Take 1 mg by mouth 4 (four) times daily as needed. For anxiety and/or sleep    . ferrous sulfate 325 (65 FE) MG tablet Take 325 mg by mouth 3 (three) times daily with meals.    . Fluticasone-Salmeterol  (ADVAIR) 250-50 MCG/DOSE AEPB Inhale 1 puff into the lungs every 12 (twelve) hours.    Marland Kitchen guaiFENesin (MUCINEX) 600 MG 12 hr tablet Take 600 mg by mouth daily as needed for cough.     . levothyroxine (SYNTHROID, LEVOTHROID) 100 MCG tablet Take 100 mcg by mouth daily before breakfast.     . loratadine (CLARITIN) 10 MG tablet Take 10 mg by mouth every morning.    . NON FORMULARY OXYGEN      24/7    . NON FORMULARY Co Q 10   One tablet daily    . NON FORMULARY Magnesium 250 mg    One tablet daily    . potassium chloride SA (K-DUR,KLOR-CON) 20 MEQ tablet Take 20 mEq by mouth daily.    Marland Kitchen torsemide (DEMADEX) 20 MG tablet Take 10 mg by mouth daily.     . vitamin B-12 (CYANOCOBALAMIN) 1000 MCG tablet Take 1,000 mcg by mouth daily.    . Vitamin D, Ergocalciferol, (DRISDOL) 50000 UNITS CAPS capsule Take 50,000 Units by mouth every 7 (seven) days. Fridays    . aspirin 81 MG chewable  tablet Chew 81 mg by mouth every morning.     No current facility-administered medications for this visit.    Review of Systems  Constitutional: negative Eyes: negative Ears, nose, mouth, throat, and face: negative Respiratory: positive for cough, dyspnea on exertion and sputum Cardiovascular: negative Gastrointestinal: negative Genitourinary:negative Integument/breast: negative Hematologic/lymphatic: negative Musculoskeletal:positive for arthralgias Neurological: negative Behavioral/Psych: negative Endocrine: negative Allergic/Immunologic: negative  Physical Exam  WNU:UVOZD, healthy, no distress, well nourished and well developed SKIN: skin color, texture, turgor are normal, no rashes or significant lesions HEAD: Normocephalic, No masses, lesions, tenderness or abnormalities EYES: normal, PERRLA EARS: External ears normal, Canals clear OROPHARYNX:no exudate and no erythema  NECK: supple, no adenopathy, no JVD LYMPH:  no palpable lymphadenopathy, no hepatosplenomegaly BREAST:not examined LUNGS: expiratory  wheezes bilaterally HEART: regular rate & rhythm, no murmurs and no gallops ABDOMEN:abdomen soft, non-tender, normal bowel sounds and no masses or organomegaly BACK: Back symmetric, no curvature., No CVA tenderness EXTREMITIES:no joint deformities, effusion, or inflammation, no edema, no skin discoloration  NEURO: alert & oriented x 3 with fluent speech, no focal motor/sensory deficits  PERFORMANCE STATUS: ECOG 1  LABORATORY DATA: Lab Results  Component Value Date   WBC 4.4 05/16/2014   HGB 9.3* 05/16/2014   HCT 29.5* 05/16/2014   MCV 88.1 05/16/2014   PLT 239 05/16/2014      Chemistry      Component Value Date/Time   NA 141 05/16/2014 1234   NA 131* 03/25/2012 1137   K 4.4 05/16/2014 1234   K 3.8 03/25/2012 1137   CL 91* 03/25/2012 1137   CO2 26 05/16/2014 1234   CO2 31 03/25/2012 1137   BUN 15.4 05/16/2014 1234   BUN 23 03/14/2013 1212   CREATININE 0.9 05/16/2014 1234   CREATININE 1.13* 03/14/2013 1210   CREATININE 0.91 03/25/2012 1137      Component Value Date/Time   CALCIUM 8.8 05/16/2014 1234   CALCIUM 8.4 03/25/2012 1137   ALKPHOS 206* 05/16/2014 1234   ALKPHOS 86 03/25/2012 1137   AST 35* 05/16/2014 1234   AST 28 03/25/2012 1137   ALT 16 05/16/2014 1234   ALT 17 03/25/2012 1137   BILITOT 0.55 05/16/2014 1234   BILITOT 0.4 03/25/2012 1137       RADIOGRAPHIC STUDIES: Dg Chest 1 View  05/06/2014   CLINICAL DATA:  Right lung nodule, status post lung biopsy.  EXAM: CHEST - 1 VIEW  COMPARISON:  05/06/2014  FINDINGS: Severe emphysema. Prior right upper wedge resection. There is some increased density at the right lung apex likely representing a small amount of post biopsy hemorrhage, a common finding. No pneumothorax is identified.  Old right rib fractures. Tortuous thoracic aorta. Mild enlargement of the cardiopericardial silhouette bony demineralization observed.  IMPRESSION: 1. There is a small amount of suspected blood products along the biopsy site, a common  post biopsy finding. No pneumothorax. 2. Emphysema. 3. Tortuous thoracic aorta and mild enlargement of the cardiopericardial silhouette   Electronically Signed   By: Sherryl Barters M.D.   On: 05/06/2014 13:40   Dg Chest 2 View  04/17/2014   CLINICAL DATA:  For most history of right lung carcinoma, chronic shortness of breath intermittent chest pain  EXAM: CHEST  2 VIEW  COMPARISON:  CT chest of 03/27/2014 and chest x-ray of 10/08/2013  FINDINGS: A line of sutures is noted in the right mid upper lung field which appears stable. However, compared to prior lateral view of the chest as well as sagittal images from  the CT chest, the rounded nodule noted within the right upper lobe medially positioned may be somewhat larger, worrisome for recurrence of lung carcinoma. This nodular lesion may be overlying the anterior right first rib and the right paratracheal region on the frontal view. If more definitive assessment is necessary repeat CT of the chest would be helpful to a evaluate interval change in size. The lungs are hyperaerated consistent with emphysema. Mild cardiomegaly is stable. No acute bony abnormality is seen.  IMPRESSION: 1. Possible increase in size of the medial right upper lobe nodule described by CT worrisome for recurrence of lung carcinoma. Consider CT of the chest. 2. Emphysema. 3. Stable cardiomegaly.   Electronically Signed   By: Ivar Drape M.D.   On: 04/17/2014 14:17   Ct Biopsy  05/06/2014   CLINICAL DATA:  History lung cancer, enlarging right upper lobe lung nodule along the surgical resection site concerning for residual or recurrent tumor.  EXAM: CT-guided right upper lobe nodule core biopsy  Date:  2/1/20162/04/2014 12:23 pm  Radiologist:  M. Daryll Brod, MD  Guidance:  CT  FLUOROSCOPY TIME:  None.  MEDICATIONS AND MEDICAL HISTORY: 1 mg Versed, 50 mcg fentanyl  ANESTHESIA/SEDATION: 15 min  CONTRAST:  None.  COMPLICATIONS: None immediate  PROCEDURE: Informed consent was obtained from the  patient following explanation of the procedure, risks, benefits and alternatives. The patient understands, agrees and consents for the procedure. All questions were addressed. A time out was performed.  Maximal barrier sterile technique utilized including caps, mask, sterile gowns, sterile gloves, large sterile drape, hand hygiene, and patent.  Previous imaging reviewed. Patient positioned right side down decubitus. Noncontrast localization CT performed. The right upper lobe enlarging nodule along the surgical resection site was localized with CT. Under sterile conditions and local anesthesia, a 17 gauge 6.8 cm access needle was advanced from a posterior paraspinous approach to the nodule. Needle position confirmed with CT. 3 18 gauge core biopsies obtained and placed in formalin. Needle removed. Postprocedure imaging demonstrates no hemorrhage or hematoma. Patient tolerated the biopsy well. No pneumothorax.  IMPRESSION: Successful CT-guided right upper lobe nodule 18 gauge core biopsy   Electronically Signed   By: Daryll Brod M.D.   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 The patient has multifocal disease including 2 nodules in the right lung with a suspicious tiny lesion in the left lung.   PLAN: I had a lengthy discussion with the patient and her family today about her current disease stage, prognosis and treatment options. The patient is not a good candidate for surgical resection. I discussed with the patient her treatment options including palliative care versus consideration of systemic treatment with EGFR tyrosine kinase inhibitor like Tarceva versus stereotactic radiotherapy to the pulmonary nodules. Taking into consideration the multifocal nature of her disease, we considered the systemic treatment with EGFR tyrosine kinase inhibitor. I will  start the patient on Tarceva 150 mg by mouth daily. I discussed with the patient adverse effect of this treatment including but not limited to skin rash, diarrhea, dry skin as well as liver or renal dysfunction and rare cases of interstitial lung disease. The patient was giving a handout about the medications. She was also given advice about management of skin rash and diarrhea if happen. I will also arrange for the patient to have a chemotherapy education class  before starting the first dose of her treatment. I will send her prescription to [facility] for refill. I would also complete the staging workup by ordering a MRI of the brain as well as a PET scan to rule out any other metastatic disease. The patient would come back for follow-up visit in 2 weeks for reevaluation and management of any adverse effect of her treatment. I gave the patient and her family the time to ask questions and answered them completely to their satisfaction. She was advised to call immediately if she has any concerning symptoms in the interval. The patient was seen during the multidisciplinary thoracic oncology clinic today by medical oncology, thoracic navigator, social worker as well as physical therapist. The patient voices understanding of current disease status and treatment options and is in agreement with the current care plan.  All questions were answered. The patient knows to call the clinic with any problems, questions or concerns. We can certainly see the patient much sooner if necessary.  Thank you so much for allowing me to participate in the care of [name]. I will continue to follow up the patient with you and assist in her care.  I spent 55 minutes counseling the patient face to face. The total time spent in the appointment was 80 minutes.  Disclaimer: This note was dictated with voice recognition software. Similar sounding words can inadvertently be transcribed and may not be corrected upon  review.   Jimmy Stipes K. May 16, 2014, 1:29 PM

## 2014-05-16 NOTE — Progress Notes (Signed)
[facility] Clinical Social Work  Clinical Social Work met with patient/family and Futures trader at [facility] appointment to offer support and assess for psychosocial needs.  Medical oncologist reviewed patient's diagnosis and recommended treatment plan with patient/family.  [name] was accompanied by her two daughters and son-in-law.  [name] lives with her daughter, son in Sports coach, and spouse.  She currently has no concerns about her plan to start Tarceva treatment.  She reported worrying about the cost of the treatment.  CSW encouraged patient to determine insurance benefits and call if she has any concerns.  CSW will connect to managed care team if she identifies any needs.  Clinical Social Work briefly discussed Clinical Social Work role and [employer] support programs/services.  Clinical Social Work encouraged patient to call with any additional questions or concerns.   [name], MSW, LCSW, OSW-C Clinical Social Worker [facility] [phone]

## 2014-05-16 NOTE — Telephone Encounter (Signed)
Pt confirmed labs/ov per 02/11 POF, gave pt AVS.... KJ, also sch pt for chemo edu class.... KJ

## 2014-05-17 ENCOUNTER — Ambulatory Visit (HOSPITAL_COMMUNITY)
Admission: RE | Admit: 2014-05-17 | Discharge: 2014-05-17 | Disposition: A | Discharge status: Home / Self Care | Payer: PPO | Source: Ambulatory Visit | Attending: Nephrology | Admitting: Nephrology

## 2014-05-17 ENCOUNTER — Ambulatory Visit (HOSPITAL_COMMUNITY)
Admission: RE | Admit: 2014-05-17 | Discharge: 2014-05-17 | Disposition: A | Discharge status: Home / Self Care | Payer: PPO | Source: Ambulatory Visit | Attending: Radiation Oncology | Admitting: Radiation Oncology

## 2014-05-17 ENCOUNTER — Encounter: Payer: Self-pay | Admitting: *Deleted

## 2014-05-17 DIAGNOSIS — N183 Chronic kidney disease, stage 3 unspecified: Secondary | ICD-10-CM

## 2014-05-17 DIAGNOSIS — N281 Cyst of kidney, acquired: Secondary | ICD-10-CM | POA: Insufficient documentation

## 2014-05-17 NOTE — [facility] Oncology Navigator Note (Unsigned)
   Thoracic Treatment Summary Name:Dajuana F Mcneil Date:05/17/2014 DOB:Oct 10, 1940 Your Medical Team Medical Oncologist:Dr. Julien Nordmann Surgeon:Dr. VanTrigt Type and Stage of Lung Cancer Non-Small Cell Carcinoma: Adenocarcinoma  Clinical Stage:  Lung cancer, upper lobe   Staging form: Lung, AJCC 7th Edition     Clinical stage from 05/16/2014: Stage IV (T3, N0, M1a) - Signed by Curt Bears, MD on 05/16/2014    Clinical stage is based on radiology exams.  Pathological stage will be determined after surgery.  Staging is based on the size of the tumor, involvement of lymph nodes or not, and whether or not the cancer center has spread. Recommendations Recommendations: Oral biologic  These recommendations are based on information available as of today's consult.  This is subject to change depending further testing or exams. Next Steps Next Step: Medical Oncology will set up follow up appointments Barriers to Care What do you perceive as a potential barrier that may prevent you from receiving your treatment plan? Education information given and explained on lung cancer and oral biologic (Tarceva) Support resources at [facility] given and explained.  Met with CSW on 05/16/14 Financial Gave patient and family [facility] outpatient phone number to help with financial assistance with medication  Resources Given: NCI Booklet on [facility] [place] at [employer].Radonna Ricker [phone] Fall Prevention Patient Safety Plan   Questions Norton Blizzard, RN BSN Thoracic Oncology Nurse Navigator at [facility] is a nurse navigator that is available to assist you through your cancer journey.  She can answer your questions and/or provide resources regarding your treatment plan, emotional support, or financial concerns.

## 2014-05-21 ENCOUNTER — Telehealth: Payer: Self-pay | Admitting: Internal Medicine

## 2014-05-21 ENCOUNTER — Encounter: Payer: Self-pay | Admitting: Internal Medicine

## 2014-05-21 ENCOUNTER — Telehealth: Payer: Self-pay | Admitting: *Deleted

## 2014-05-21 NOTE — Telephone Encounter (Signed)
Called patient back after I spoke with Dr. Julien Nordmann.  Her leg swelling is not a contraindication for taking medication.  She is able to take her medication.  She stated medication will be arriving at [facility] hospital today.  She stated she was nervous about taking medication but she is ready to get started.  I encourage her and asked that she call with any concerns.

## 2014-05-21 NOTE — Telephone Encounter (Signed)
Called to follow up with patient from thoracic clinic.  She has a question about her leg swelling and her new medication.  She would like me to clear up questions with Dr. Julien Nordmann

## 2014-05-21 NOTE — Progress Notes (Signed)
Per Kennyth Lose at [facility] the patient was approved for Tarceva 10,000.00 05/20/14-05/20/15. Sending notice to billing and medical records.

## 2014-05-21 NOTE — Telephone Encounter (Signed)
pt called to question MRI and PET appts....i advised that cs will be contacting her once the precert is recieved

## 2014-05-24 ENCOUNTER — Other Ambulatory Visit: Payer: Self-pay | Admitting: Cardiothoracic Surgery

## 2014-05-28 ENCOUNTER — Other Ambulatory Visit: Payer: PPO

## 2014-05-29 ENCOUNTER — Encounter (HOSPITAL_COMMUNITY)
Admission: RE | Admit: 2014-05-29 | Discharge: 2014-05-29 | Disposition: A | Discharge status: Home / Self Care | Payer: PPO | Source: Ambulatory Visit | Attending: Internal Medicine | Admitting: Internal Medicine

## 2014-05-29 DIAGNOSIS — C3411 Malignant neoplasm of upper lobe, right bronchus or lung: Secondary | ICD-10-CM | POA: Insufficient documentation

## 2014-05-29 LAB — GLUCOSE, CAPILLARY: Glucose-Capillary: 115 mg/dL — ABNORMAL HIGH (ref 70–99)

## 2014-05-29 MED ORDER — FLUDEOXYGLUCOSE F - 18 (FDG) INJECTION
849.0000 | Freq: Once | INTRAVENOUS | Status: AC | PRN
  Administered 2014-05-29: 849 via INTRAVENOUS

## 2014-05-31 ENCOUNTER — Ambulatory Visit (HOSPITAL_COMMUNITY)
Admission: RE | Admit: 2014-05-31 | Discharge: 2014-05-31 | Disposition: A | Discharge status: Home / Self Care | Payer: PPO | Source: Ambulatory Visit | Attending: Internal Medicine | Admitting: Internal Medicine

## 2014-05-31 ENCOUNTER — Other Ambulatory Visit: Payer: Self-pay | Admitting: Physician Assistant

## 2014-05-31 DIAGNOSIS — R51 Headache: Secondary | ICD-10-CM | POA: Insufficient documentation

## 2014-05-31 DIAGNOSIS — R9082 White matter disease, unspecified: Secondary | ICD-10-CM | POA: Insufficient documentation

## 2014-05-31 DIAGNOSIS — I638 Other cerebral infarction: Secondary | ICD-10-CM | POA: Diagnosis not present

## 2014-05-31 DIAGNOSIS — H539 Unspecified visual disturbance: Secondary | ICD-10-CM | POA: Insufficient documentation

## 2014-05-31 DIAGNOSIS — R262 Difficulty in walking, not elsewhere classified: Secondary | ICD-10-CM | POA: Insufficient documentation

## 2014-05-31 DIAGNOSIS — C3411 Malignant neoplasm of upper lobe, right bronchus or lung: Secondary | ICD-10-CM | POA: Diagnosis not present

## 2014-05-31 MED ORDER — GADOBENATE DIMEGLUMINE 529 MG/ML IV SOLN
10.0000 mL | Freq: Once | INTRAVENOUS | Status: AC | PRN
  Administered 2014-05-31: 8 mL via INTRAVENOUS

## 2014-06-03 ENCOUNTER — Telehealth: Payer: Self-pay | Admitting: Internal Medicine

## 2014-06-03 ENCOUNTER — Other Ambulatory Visit (HOSPITAL_BASED_OUTPATIENT_CLINIC_OR_DEPARTMENT_OTHER): Payer: PPO

## 2014-06-03 ENCOUNTER — Encounter: Payer: Self-pay | Admitting: Physician Assistant

## 2014-06-03 ENCOUNTER — Ambulatory Visit (HOSPITAL_BASED_OUTPATIENT_CLINIC_OR_DEPARTMENT_OTHER): Payer: PPO | Admitting: Physician Assistant

## 2014-06-03 ENCOUNTER — Encounter: Payer: Self-pay | Admitting: *Deleted

## 2014-06-03 VITALS — BP 182/84 | HR 87 | Temp 98.3°F | Resp 20 | Ht 64.0 in | Wt 129.8 lb

## 2014-06-03 DIAGNOSIS — C3411 Malignant neoplasm of upper lobe, right bronchus or lung: Secondary | ICD-10-CM

## 2014-06-03 DIAGNOSIS — R197 Diarrhea, unspecified: Secondary | ICD-10-CM

## 2014-06-03 DIAGNOSIS — L988 Other specified disorders of the skin and subcutaneous tissue: Secondary | ICD-10-CM

## 2014-06-03 DIAGNOSIS — E039 Hypothyroidism, unspecified: Secondary | ICD-10-CM

## 2014-06-03 LAB — COMPREHENSIVE METABOLIC PANEL (CC13)
ALBUMIN: 3.9 g/dL (ref 3.5–5.0)
ANION GAP: 10 meq/L (ref 3–11)
AST: 14 U/L (ref 5–34)
Alkaline Phosphatase: 107 U/L (ref 40–150)
BUN: 11 mg/dL (ref 7.0–26.0)
CALCIUM: 9 mg/dL (ref 8.4–10.4)
CHLORIDE: 104 meq/L (ref 98–109)
CO2: 24 meq/L (ref 22–29)
Creatinine: 1.1 mg/dL (ref 0.6–1.1)
EGFR: 53 mL/min/{1.73_m2} — ABNORMAL LOW (ref >90)
Glucose: 115 mg/dl (ref 70–140)
Potassium: 4.3 mEq/L (ref 3.5–5.1)
Sodium: 139 mEq/L (ref 136–145)
TOTAL PROTEIN: 6.6 g/dL (ref 6.4–8.3)
Total Bilirubin: 0.94 mg/dL (ref 0.20–1.20)

## 2014-06-03 LAB — CBC WITH DIFFERENTIAL/PLATELET
BASO%: 1.1 % (ref 0.0–2.0)
Basophils Absolute: 0.1 10*3/uL (ref 0.0–0.1)
EOS%: 10.1 % — AB (ref 0.0–7.0)
Eosinophils Absolute: 0.5 10*3/uL (ref 0.0–0.5)
HCT: 31.6 % — ABNORMAL LOW (ref 34.8–46.6)
HGB: 10.4 g/dL — ABNORMAL LOW (ref 11.6–15.9)
LYMPH%: 24.7 % (ref 14.0–49.7)
MCH: 28 pg (ref 25.1–34.0)
MCHC: 32.7 g/dL (ref 31.5–36.0)
MCV: 85.6 fL (ref 79.5–101.0)
MONO#: 0.4 10*3/uL (ref 0.1–0.9)
MONO%: 8.5 % (ref 0.0–14.0)
NEUT%: 55.6 % (ref 38.4–76.8)
NEUTROS ABS: 2.6 10*3/uL (ref 1.5–6.5)
Platelets: 285 10*3/uL (ref 145–400)
RBC: 3.7 10*6/uL (ref 3.70–5.45)
RDW: 13.5 % (ref 11.2–14.5)
WBC: 4.6 10*3/uL (ref 3.9–10.3)
lymph#: 1.1 10*3/uL (ref 0.9–3.3)

## 2014-06-03 MED ORDER — CLINDAMYCIN PHOSPHATE 1 % EX SOLN: Freq: Two times a day (BID) | CUTANEOUS | Status: AC

## 2014-06-03 NOTE — Telephone Encounter (Signed)
gv and printed appt sched anda vs for pt for March.... °

## 2014-06-03 NOTE — [facility] Oncology Navigator Note (Unsigned)
Spoke with patient today at [facility] during her office visit.  She stated she started on Tarceva on 05/22/14.  She noted some mild rash on abd and face.  Dr. Julien Nordmann stated he would order medication for rash.  I gave her some information on Tarceva when she was seen on 05/16/14 but it was in [place].  I gave her more information today in [place] and apologized for mix up.  She and her family laughed and had no complaints about materials in [place].

## 2014-06-03 NOTE — Progress Notes (Addendum)
No images are attached to the encounter. No scans are attached to the encounter. No scans are attached to the encounter. [place] VISIT PROGRESS NOTE  Glo Herring., MD [place] [id number]  DIAGNOSIS: Lung cancer, upper lobe   Staging form: Lung, AJCC 7th Edition     Clinical stage from 05/16/2014: Stage IV (T3, N0, M1a) - Signed by Curt Bears, MD on 05/16/2014  PRIOR THERAPY: none  CURRENT THERAPY: Tarceva 150 mg by mouth daily. First dose 05/22/2014  INTERVAL HISTORY: [name] 74 y.o. female returns for a scheduled regular symptom management visit for followup of her recurrent lung cancer. She is accompanied by several family members today. She is being treated with Tarceva 150 mg by mouth daily with therapy beginning 05/22/2014. Overall she's tolerating this medication relatively well with the exception of very dry "leathery" skin. She's had a few episodes of diarrhea. No more than 2 or 3 episodes in a given 24 hour timeframe. She does report that she had a large episode of diarrhea after the MRI with contrast. She had a MRI of the brain with and without contrast on 06/01/2014 and a PET scan to 24 2016. She reports pain that she had nerve midsternum area that radiated to her back after taking her second dose of Tarceva. She described it as a burning pain but she's had no recurrence. She occasionally has some right posterior back/side pain. She voiced no other specific complaints today. She denied fever, chills, cough or hemoptysis. She continues on home oxygen.  MEDICAL HISTORY: Past Medical History  Diagnosis Date  . COPD (chronic obstructive pulmonary disease)   . Thyroid disease   . Pernicious anemia   . Hypertension   . Lung cancer     ALLERGIES:  is allergic to codeine and penicillins.  MEDICATIONS:  Current Outpatient Prescriptions  Medication Sig Dispense Refill  . acetaminophen (TYLENOL) 500 MG tablet Take 500 mg by  mouth every 6 (six) hours as needed.    Marland Kitchen albuterol (PROVENTIL) (2.5 MG/3ML) 0.083% nebulizer solution Take 2.5 mg by nebulization every 4 (four) hours.     . ALPRAZolam (XANAX) 1 MG tablet Take 1 mg by mouth 4 (four) times daily as needed. For anxiety and/or sleep    . aspirin 81 MG chewable tablet Chew 81 mg by mouth every morning.    . erlotinib (TARCEVA) 150 MG tablet Take 1 tablet (150 mg total) by mouth daily. Take on an empty stomach 1 hour before meals or 2 hours after. 30 tablet 2  . ferrous sulfate 325 (65 FE) MG tablet Take 325 mg by mouth 3 (three) times daily with meals.    . Fluticasone-Salmeterol (ADVAIR) 250-50 MCG/DOSE AEPB Inhale 1 puff into the lungs every 12 (twelve) hours.    Marland Kitchen guaiFENesin (MUCINEX) 600 MG 12 hr tablet Take 600 mg by mouth daily as needed for cough.     . levothyroxine (SYNTHROID, LEVOTHROID) 100 MCG tablet Take 100 mcg by mouth daily before breakfast.     . loperamide (IMODIUM) 2 MG capsule Take 2 mg by mouth as needed for diarrhea or loose stools.    Marland Kitchen loratadine (CLARITIN) 10 MG tablet Take 10 mg by mouth every morning.    . NON FORMULARY OXYGEN      24/7    . NON FORMULARY Co Q 10   One tablet daily    . NON FORMULARY Magnesium 250 mg    One tablet daily    .  potassium chloride SA (K-DUR,KLOR-CON) 20 MEQ tablet Take 20 mEq by mouth daily.    Marland Kitchen torsemide (DEMADEX) 20 MG tablet Take 10 mg by mouth daily.     . vitamin B-12 (CYANOCOBALAMIN) 1000 MCG tablet Take 1,000 mcg by mouth daily.    . Vitamin D, Ergocalciferol, (DRISDOL) 50000 UNITS CAPS capsule Take 50,000 Units by mouth every 7 (seven) days. Fridays     No current facility-administered medications for this visit.    SURGICAL HISTORY:  Past Surgical History  Procedure Laterality Date  . Abdominal hysterectomy    . Appendectomy    . Cholecystectomy    . Tubal ligation    . Chest tube insertion  03/08/2012    Procedure: CHEST TUBE INSERTION;  Surgeon: Ivin Poot, MD;  Location: [facility];   Service: Thoracic;  Laterality: Right;  . Video assisted thoracoscopy  03/17/2012    Procedure: VIDEO ASSISTED THORACOSCOPY;  Surgeon: Ivin Poot, MD;  Location: [facility];  Service: Thoracic;  Laterality: Right;  . Resection of apical bleb  03/17/2012    Procedure: RESECTION OF APICAL BLEB;  Surgeon: Ivin Poot, MD;  Location: [facility];  Service: Thoracic;  Laterality: Right;  stapling of bleb  . Colonoscopy N/A 04/29/2014    Procedure: COLONOSCOPY;  Surgeon: Daneil Dolin, MD;  [facility];  Service: Endoscopy;  Laterality: N/A;  1245pm  . Esophagogastroduodenoscopy N/A 04/29/2014    Procedure: ESOPHAGOGASTRODUODENOSCOPY (EGD);  Surgeon: Daneil Dolin, MD;  [facility];  Service: Endoscopy;  Laterality: N/A;  . Savory dilation N/A 04/29/2014    Procedure: SAVORY DILATION;  Surgeon: Daneil Dolin, MD;  [facility];  Service: Endoscopy;  Laterality: N/A;  Venia Minks dilation N/A 04/29/2014    Procedure: Venia Minks DILATION;  Surgeon: Daneil Dolin, MD;  [facility];  Service: Endoscopy;  Laterality: N/A;    REVIEW OF SYSTEMS:  Review of Systems  Constitutional: Negative for fever, chills, weight loss, malaise/fatigue and diaphoresis.  HENT: Negative for congestion, ear discharge, ear pain, hearing loss, nosebleeds, sore throat and tinnitus.   Eyes: Negative for blurred vision, double vision, photophobia, pain, discharge and redness.  Respiratory: Positive for shortness of breath. Negative for cough, hemoptysis, sputum production, wheezing and stridor.   Cardiovascular: Negative for chest pain, palpitations, orthopnea, claudication, leg swelling and PND.  Gastrointestinal: Negative for heartburn, nausea, vomiting, abdominal pain, diarrhea, constipation, blood in stool and melena.  Genitourinary: Negative.   Musculoskeletal: Negative.   Skin: Negative.        Dry skin to the point that it feels like "leather".  Neurological: Negative for  dizziness, tingling, focal weakness, seizures, weakness and headaches.  Endo/Heme/Allergies: Does not bruise/bleed easily.  Psychiatric/Behavioral: Negative for depression. The patient is not nervous/anxious and does not have insomnia.      PHYSICAL EXAMINATION: Physical Exam  Constitutional: She is oriented to person, place, and time and well-developed, well-nourished, and in no distress.  HENT:  Head: Normocephalic and atraumatic.  Mouth/Throat: Oropharynx is clear and moist.  Eyes: Pupils are equal, round, and reactive to light.  Neck: Normal range of motion. Neck supple. No JVD present. No tracheal deviation present. No thyromegaly present.  Cardiovascular: Normal rate, regular rhythm, normal heart sounds and intact distal pulses.  Exam reveals no gallop and no friction rub.   No murmur heard. Pulmonary/Chest: Effort normal and breath sounds normal. No respiratory distress. She has no wheezes. She has no rales.  Patient is  on home oxygen via nasal cannula  Abdominal: Soft. Bowel sounds are normal. She exhibits no distension and no mass. There is no tenderness.  Musculoskeletal: Normal range of motion. She exhibits no edema or tenderness.  Lymphadenopathy:    She has no cervical adenopathy.  Neurological: She is alert and oriented to person, place, and time. She has normal reflexes. Gait normal.  Skin: Skin is warm and dry. No rash noted.    ECOG PERFORMANCE STATUS: 1 - Symptomatic but completely ambulatory  Blood pressure 182/84, pulse 87, temperature 98.3 F (36.8 C), temperature source Oral, resp. rate 20, height 5' 4"  (1.626 m), weight 129 lb 12.8 oz (58.877 kg), SpO2 99 %.  LABORATORY DATA: Lab Results  Component Value Date   WBC 4.6 06/03/2014   HGB 10.4* 06/03/2014   HCT 31.6* 06/03/2014   MCV 85.6 06/03/2014   PLT 285 06/03/2014      Chemistry      Component Value Date/Time   NA 139 06/03/2014 0928   NA 131* 03/25/2012 1137   K 4.3 06/03/2014 0928   K 3.8  03/25/2012 1137   CL 91* 03/25/2012 1137   CO2 24 06/03/2014 0928   CO2 31 03/25/2012 1137   BUN 11.0 06/03/2014 0928   BUN 23 03/14/2013 1212   CREATININE 1.1 06/03/2014 0928   CREATININE 1.13* 03/14/2013 1210   CREATININE 0.91 03/25/2012 1137      Component Value Date/Time   CALCIUM 9.0 06/03/2014 0928   CALCIUM 8.4 03/25/2012 1137   ALKPHOS 107 06/03/2014 0928   ALKPHOS 86 03/25/2012 1137   AST 14 06/03/2014 0928   AST 28 03/25/2012 1137   ALT <6 06/03/2014 0928   ALT 17 03/25/2012 1137   BILITOT 0.94 06/03/2014 0928   BILITOT 0.4 03/25/2012 1137       RADIOGRAPHIC STUDIES:  Dg Chest 1 View  05/06/2014   CLINICAL DATA:  Right lung nodule, status post lung biopsy.  EXAM: CHEST - 1 VIEW  COMPARISON:  05/06/2014  FINDINGS: Severe emphysema. Prior right upper wedge resection. There is some increased density at the right lung apex likely representing a small amount of post biopsy hemorrhage, a common finding. No pneumothorax is identified.  Old right rib fractures. Tortuous thoracic aorta. Mild enlargement of the cardiopericardial silhouette bony demineralization observed.  IMPRESSION: 1. There is a small amount of suspected blood products along the biopsy site, a common post biopsy finding. No pneumothorax. 2. Emphysema. 3. Tortuous thoracic aorta and mild enlargement of the cardiopericardial silhouette   Electronically Signed   By: Sherryl Barters M.D.   On: 05/06/2014 13:40   Mr Jeri Cos JD Contrast  06/01/2014   CLINICAL DATA:  Right upper lobe lung cancer. Difficulty walking. Vision problems. Headaches.  EXAM: MRI HEAD WITHOUT AND WITH CONTRAST  TECHNIQUE: Multiplanar, multiecho pulse sequences of the brain and surrounding structures were obtained without and with intravenous contrast.  CONTRAST:  8 mL MultiHance  COMPARISON:  CT head without and with contrast 03/16/2012.  FINDINGS: No acute infarct, hemorrhage, or mass lesion is present. The postcontrast images demonstrate no  pathologic enhancement to suggest metastatic disease of the brain or meninges.  Multiple remote lacunar infarcts are present in the basal ganglia bilaterally, particularly the anterior right lentiform nucleus and caudate head. Extensive periventricular and subcortical white matter changes are advanced for age. The ventricles are proportionate to the degree of atrophy. White matter changes extend into the brainstem. There are remote lacunar infarcts within the left cerebellum.  Flow is present in the major intracranial arteries. The globes and orbits are intact. The paranasal sinuses and mastoid air cells are clear.  Skullbase is within normal limits. Midline structures are normal. The sella is within normal limits. The optic chiasm is normal.  Asymmetric degenerative changes are present at the right atlantooccipital joint.  IMPRESSION: 1. No acute intracranial abnormality or evidence for metastatic disease. 2. 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 3. White matter changes extend into the brainstem. Remote lacunar infarcts are present in the left cerebellum. 4. Extensive atrophy and white matter disease likely reflects the sequela of chronic microvascular ischemia. 5. Asymmetric degenerative changes at the right atlantooccipital joint P   Electronically Signed   By: San Morelle M.D.   On: 06/01/2014 14:06   US Renal  05/17/2014   CLINICAL DATA:  74 year old female with stage 3 chronic kidney disease  EXAM: RENAL/URINARY TRACT ULTRASOUND COMPLETE  COMPARISON:  Prior PET-CT 10/31/2012  FINDINGS: Right Kidney:  Length: 10.4 cm. No hydronephrosis. Parenchymal echogenicity is within normal limits. Very mild cortical renal thinning commensurate for age. No significant scarring.  Left Kidney:  Length: 11.0 cm. No hydronephrosis. Parenchymal echogenicity is within normal limits. Very mild cortical renal thinning  commensurate with age. No significant scarring. 9 mm hypoechoic circumscribed lesion exophytic from the lower pole. Most consistent with a small simple cyst.  Bladder:  Appears normal for degree of bladder distention.  IMPRESSION: 1. No evidence of hydronephrosis. 2. Mild renal cortical thinning bilaterally commensurate with age. 3. Small sub cm cyst off the left lower pole.   Electronically Signed   By: Jacqulynn Cadet M.D.   On: 05/17/2014 17:02   Nm Pet Image Restag (ps) Skull Base To Thigh  05/29/2014   CLINICAL DATA:  Subsequent treatment strategy for lung cancer. Restaging examination.  EXAM: NUCLEAR MEDICINE PET SKULL BASE TO THIGH  TECHNIQUE: 6.8 mCi F-18 FDG was injected intravenously. Full-ring PET imaging was performed from the skull base to thigh after the radiotracer. CT data was obtained and used for attenuation correction and anatomic localization.  FASTING BLOOD GLUCOSE:  Value: 115 mg/dl  COMPARISON:  Chest CT 03/27/2014.  FINDINGS: NECK  No hypermetabolic lymph nodes in the neck.  CHEST  Postoperative changes of wedge resection in the right upper lobe are noted. Adjacent to the suture line there is a hypermetabolic (SUVmax = 27.2)ZDGU that currently measures 3.1 x 2.2 cm (image 15 of series 8), which is also associated with the superior aspect of the major fissure. More inferiorly there are some amorphous areas of hypermetabolism corresponding to areas of ground-glass attenuation (SUVmax = 2.8). On image 27 of series 8 there is an additional 11 mm hypermetabolic (SUVmax = 4.4)IHKVQ upper lobe nodule that appears similar in size to prior study 03/07/2014, also adjacent to the suture line. Additionally, in the medial aspect of the right upper lobe (image 59 of series 4) there is a pleural-based 1.7 x 1.2 cm hypermetabolic (SUVmax = 25.9) nodule. No definite hypermetabolic mediastinal or hilar lymph nodes are noted. Heart size is mildly enlarged. There is no significant pericardial fluid,  thickening or pericardial calcification. There is atherosclerosis of the thoracic aorta, the great vessels of the mediastinum and the coronary arteries, including calcified atherosclerotic plaque in the left main, left anterior descending, left circumflex and right coronary arteries. Dilatation of the pulmonic trunk (4.1 cm in diameter), suggestive of pulmonary arterial hypertension. Moderate  centrilobular emphysema redemonstrated.  ABDOMEN/PELVIS  No abnormal hypermetabolic activity within the liver, pancreas, adrenal glands, or spleen. No hypermetabolic lymph nodes in the abdomen or pelvis. Status post cholecystectomy. Extensive calcifications throughout the pancreatic parenchyma, presumably related to chronic pancreatitis. There are some subtle inflammatory changes around the pancreas on today's examination, which could indicate acute pancreatitis. Additionally, there is a 2.8 x 1.6 cm low-attenuation area in the head of the pancreas, which demonstrates no hypermetabolism on the PET portion of the examination, which may represent a pancreatic pseudocyst, or may simply represent a dilated pancreatic duct. This is new compared to the prior examination, and a cystic neoplasm is felt less likely. Spleen appears borderline enlarged measuring 12.8 x 4.4 x 13.0 cm (estimated splenic volume of 366 mL). Extensive atherosclerosis throughout the abdominal and pelvic vasculature, including a fusiform infrarenal abdominal aortic aneurysm which measures up to 3.5 x 4.5 cm in diameter, only slightly larger than the prior study from 10/31/2012 at which point it measured 3.5 x 4.2 cm when measured in a similar fashion. Pelvic floor laxity with widening of the levator hiatus, suggesting pelvic organ prolapse. Status post hysterectomy. Ovaries are not confidently identified may be surgically absent or atrophic.  SKELETON  No focal hypermetabolic activity to suggest skeletal metastasis.  IMPRESSION: 1. Findings are concerning for  local recurrence of disease adjacent to the suture line in the right upper lobe where there is a pleural-based 3.1 x 2.2 cm hypermetabolic mass, with apparent dissemination along with the pleural lymphatics in the right upper lobe, with at least 2 separate hypermetabolic nodules both medially and laterally, as detailed above. No hilar or mediastinal lymphadenopathy noted at this time. Additionally, there is no evidence of metastatic disease in the neck, abdomen or pelvis. 2. Peripancreatic inflammatory changes, new compared to the prior study. This is concerning for acute on chronic pancreatitis, and clinical correlation is recommended. 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 3. Atherosclerosis, including left main and 3 vessel coronary artery disease. In addition, there is a 4.5 x 3.5 cm fusiform infrarenal abdominal aortic aneurysm. Recommend followup by ultrasound in 1 year. This recommendation follows ACR consensus guidelines: White Paper of the ACR Incidental Findings Committee II on Vascular Findings. J Am Coll Radiol 2013; 10:789-794. 4. Additional incidental findings, as above, similar prior studies.   Electronically Signed   By: Vinnie Langton M.D.   On: 05/29/2014 12:39   Ct Biopsy  05/06/2014   CLINICAL DATA:  History lung cancer, enlarging right upper lobe lung nodule along the surgical resection site concerning for residual or recurrent tumor.  EXAM: CT-guided right upper lobe nodule core biopsy  Date:  2/1/20162/04/2014 12:23 pm  Radiologist:  M. Daryll Brod, MD  Guidance:  CT  FLUOROSCOPY TIME:  None.  MEDICATIONS AND MEDICAL HISTORY: 1 mg Versed, 50 mcg fentanyl  ANESTHESIA/SEDATION: 15 min  CONTRAST:  None.  COMPLICATIONS: None immediate  PROCEDURE: Informed consent was obtained from the patient following explanation of the procedure, risks,  benefits and alternatives. The patient understands, agrees and consents for the procedure. All questions were addressed. A time out was performed.  Maximal barrier sterile technique utilized including caps, mask, sterile gowns, sterile gloves, large sterile drape, hand hygiene, and patent.  Previous imaging reviewed. Patient positioned right side down decubitus. Noncontrast localization CT performed. The right upper lobe enlarging nodule along  the surgical resection site was localized with CT. Under sterile conditions and local anesthesia, a 17 gauge 6.8 cm access needle was advanced from a posterior paraspinous approach to the nodule. Needle position confirmed with CT. 3 18 gauge core biopsies obtained and placed in formalin. Needle removed. Postprocedure imaging demonstrates no hemorrhage or hematoma. Patient tolerated the biopsy well. No pneumothorax.  IMPRESSION: Successful CT-guided right upper lobe nodule 18 gauge core biopsy   Electronically Signed   By: Daryll Brod M.D.   On: 05/06/2014 12:29     ASSESSMENT/PLAN:  No problem-specific assessment & plan notes found for this encounter. Patient is a pleasant 74 year old Caucasian female recently presenting with recurrent lung cancer. She is being treated with Tarceva 150 mg by mouth daily with therapy beginning 05/22/2014. Overall she's tolerating the treatment relatively well with the exception of dry skin and a few episodes of diarrhea. Patient was discussed with also seen by Dr. Julien Nordmann. Results of her PET scan and MRI were reviewed with the family. Her disease is limited to the right upper lobe suture line with apparent dissemination along the pleural lymphatics in the right upper lobe. The MRI of the brain was negative for brain metastasis. She will continue on Tarceva 150 mg by mouth daily. She will follow-up in 2 weeks for another symptom management visit with repeat labs.  Awilda Metro E, PA-C 06/03/2014  All questions were answered. The  patient knows to call the clinic with any problems, questions or concerns. We can certainly see the patient much sooner if necessary.  ADDENDUM: Hematology/Oncology Attending: I had a face to face encounter with the patient. I recommended her care plan. This is a very pleasant 74 years old white female with metastatic non-small cell lung cancer, adenocarcinoma with positive EGFR mutation currently on treatment with Tarceva started 12 days ago and the patient is tolerating her treatment fairly well except for dry skin and mild skin rash on the face. She had MRI of the brain that was negative for metastatic disease. The PET scan showed very similar findings to the previous imaging studies. I discussed the imaging studies with the patient and her family. I recommended for her to continue her current treatment with Tarceva at the same dose. She was advised to take Imodium for diarrhea and we will consider the patient for treatment was clindamycin lotion for skin rash if it gets any worse. She would come back for follow-up visit in 2 weeks for reevaluation. The patient was advised to call immediately if she has any concerning symptoms in the interval.  Disclaimer: This note was dictated with voice recognition software. Similar sounding words can inadvertently be transcribed and may be missed upon review. Eilleen Kempf., MD 06/03/2014

## 2014-06-03 NOTE — Patient Instructions (Signed)
Continue Tarceva 150 mg by mouth daily Follow up in 2 weeks

## 2014-06-06 ENCOUNTER — Encounter: Payer: Self-pay | Admitting: Internal Medicine

## 2014-06-19 ENCOUNTER — Encounter: Payer: Self-pay | Admitting: Nurse Practitioner

## 2014-06-19 ENCOUNTER — Telehealth: Payer: Self-pay | Admitting: Nurse Practitioner

## 2014-06-19 ENCOUNTER — Other Ambulatory Visit (HOSPITAL_BASED_OUTPATIENT_CLINIC_OR_DEPARTMENT_OTHER): Payer: PPO

## 2014-06-19 ENCOUNTER — Ambulatory Visit (HOSPITAL_BASED_OUTPATIENT_CLINIC_OR_DEPARTMENT_OTHER): Payer: PPO | Admitting: Nurse Practitioner

## 2014-06-19 VITALS — BP 152/75 | HR 96 | Temp 98.2°F | Resp 19 | Ht 64.0 in | Wt 126.9 lb

## 2014-06-19 DIAGNOSIS — C3411 Malignant neoplasm of upper lobe, right bronchus or lung: Secondary | ICD-10-CM

## 2014-06-19 DIAGNOSIS — M2550 Pain in unspecified joint: Secondary | ICD-10-CM

## 2014-06-19 DIAGNOSIS — R197 Diarrhea, unspecified: Secondary | ICD-10-CM

## 2014-06-19 DIAGNOSIS — M545 Low back pain: Secondary | ICD-10-CM

## 2014-06-19 LAB — CBC WITH DIFFERENTIAL/PLATELET
BASO%: 0.3 % (ref 0.0–2.0)
Basophils Absolute: 0 10*3/uL (ref 0.0–0.1)
EOS%: 7.2 % — ABNORMAL HIGH (ref 0.0–7.0)
Eosinophils Absolute: 0.3 10*3/uL (ref 0.0–0.5)
HCT: 30.1 % — ABNORMAL LOW (ref 34.8–46.6)
HGB: 9.8 g/dL — ABNORMAL LOW (ref 11.6–15.9)
LYMPH%: 29.2 % (ref 14.0–49.7)
MCH: 28.3 pg (ref 25.1–34.0)
MCHC: 32.6 g/dL (ref 31.5–36.0)
MCV: 87 fL (ref 79.5–101.0)
MONO#: 0.4 10*3/uL (ref 0.1–0.9)
MONO%: 10.6 % (ref 0.0–14.0)
NEUT#: 2 10*3/uL (ref 1.5–6.5)
NEUT%: 52.7 % (ref 38.4–76.8)
Platelets: 194 10*3/uL (ref 145–400)
RBC: 3.46 10*6/uL — AB (ref 3.70–5.45)
RDW: 13.3 % (ref 11.2–14.5)
WBC: 3.9 10*3/uL (ref 3.9–10.3)
lymph#: 1.1 10*3/uL (ref 0.9–3.3)

## 2014-06-19 LAB — COMPREHENSIVE METABOLIC PANEL (CC13)
ALT: 8 U/L (ref 0–55)
AST: 13 U/L (ref 5–34)
Albumin: 3.5 g/dL (ref 3.5–5.0)
Alkaline Phosphatase: 102 U/L (ref 40–150)
Anion Gap: 10 mEq/L (ref 3–11)
BILIRUBIN TOTAL: 0.7 mg/dL (ref 0.20–1.20)
BUN: 15.7 mg/dL (ref 7.0–26.0)
CHLORIDE: 103 meq/L (ref 98–109)
CO2: 26 mEq/L (ref 22–29)
Calcium: 8.7 mg/dL (ref 8.4–10.4)
Creatinine: 1.1 mg/dL (ref 0.6–1.1)
EGFR: 50 mL/min/{1.73_m2} — ABNORMAL LOW (ref >90)
Glucose: 127 mg/dl (ref 70–140)
Potassium: 4 mEq/L (ref 3.5–5.1)
Sodium: 139 mEq/L (ref 136–145)
Total Protein: 6.1 g/dL — ABNORMAL LOW (ref 6.4–8.3)

## 2014-06-19 NOTE — Addendum Note (Signed)
Addended by: Marcelino Duster on: 06/19/2014 06:44 PM   Modules accepted: Orders

## 2014-06-19 NOTE — Progress Notes (Signed)
No images are attached to the encounter. No scans are attached to the encounter. No scans are attached to the encounter. [facility] VISIT PROGRESS NOTE  Glo Herring., MD [place] [address]  DIAGNOSIS: Lung cancer, upper lobe   Staging form: Lung, AJCC 7th Edition     Clinical stage from 05/16/2014: Stage IV (T3, N0, M1a) - Signed by Curt Bears, MD on 05/16/2014  PRIOR THERAPY: none  CURRENT THERAPY: Tarceva 150 mg by mouth daily. First dose 05/22/2014  INTERVAL HISTORY: [name] 74 y.o. female returns for a scheduled regular symptom management visit for followup of her recurrent lung cancer. She is accompanied by several family members today. She is being treated with Tarceva 150 mg by mouth daily with therapy beginning 05/22/2014. She continues on 3LO2. She has some shortness of breath with activity, but no cough or hemoptysis. Her diarrhea is controlled with imodium PRN. Her appetite is at about 75%, but she is eating something at least 3 times daily. The clindamycin cream is helping with her skin. It is smoother and helps with the breakouts on her face. She denies fevers or chills. She has some nausea immediately after taking the tarceva, but it resolves on its own. She has mild headaches and intermittent back and joint pains.   MEDICAL HISTORY: Past Medical History  Diagnosis Date  . COPD (chronic obstructive pulmonary disease)   . Thyroid disease   . Pernicious anemia   . Hypertension   . Lung cancer     ALLERGIES:  is allergic to codeine and penicillins.  MEDICATIONS:  Current Outpatient Prescriptions  Medication Sig Dispense Refill  . acetaminophen (TYLENOL) 500 MG tablet Take 500 mg by mouth every 6 (six) hours as needed.    Marland Kitchen albuterol (PROVENTIL) (2.5 MG/3ML) 0.083% nebulizer solution Take 2.5 mg by nebulization every 4 (four) hours.     . ALPRAZolam (XANAX) 1 MG tablet Take 1 mg by mouth 4 (four) times daily as  needed. For anxiety and/or sleep    . aspirin 81 MG chewable tablet Chew 81 mg by mouth every morning.    . clindamycin (CLEOCIN T) 1 % external solution Apply topically 2 (two) times daily. 30 mL 1  . erlotinib (TARCEVA) 150 MG tablet Take 1 tablet (150 mg total) by mouth daily. Take on an empty stomach 1 hour before meals or 2 hours after. 30 tablet 2  . ferrous sulfate 325 (65 FE) MG tablet Take 325 mg by mouth 3 (three) times daily with meals.    . Fluticasone-Salmeterol (ADVAIR) 250-50 MCG/DOSE AEPB Inhale 1 puff into the lungs every 12 (twelve) hours.    Marland Kitchen guaiFENesin (MUCINEX) 600 MG 12 hr tablet Take 600 mg by mouth daily as needed for cough.     . levothyroxine (SYNTHROID, LEVOTHROID) 100 MCG tablet Take 100 mcg by mouth daily before breakfast.     . loperamide (IMODIUM) 2 MG capsule Take 2 mg by mouth as needed for diarrhea or loose stools.    Marland Kitchen loratadine (CLARITIN) 10 MG tablet Take 10 mg by mouth every morning.    . NON FORMULARY OXYGEN      24/7    . NON FORMULARY Co Q 10   One tablet daily    . NON FORMULARY Magnesium 250 mg    One tablet daily    . potassium chloride SA (K-DUR,KLOR-CON) 20 MEQ tablet Take 20 mEq by mouth daily.    Marland Kitchen torsemide (DEMADEX) 20  MG tablet Take 10 mg by mouth daily.     . vitamin B-12 (CYANOCOBALAMIN) 1000 MCG tablet Take 1,000 mcg by mouth daily.    . Vitamin D, Ergocalciferol, (DRISDOL) 50000 UNITS CAPS capsule Take 50,000 Units by mouth every 7 (seven) days. Fridays     No current facility-administered medications for this visit.    SURGICAL HISTORY:  Past Surgical History  Procedure Laterality Date  . Abdominal hysterectomy    . Appendectomy    . Cholecystectomy    . Tubal ligation    . Chest tube insertion  03/08/2012    Procedure: CHEST TUBE INSERTION;  Surgeon: Ivin Poot, MD;  Location: [facility];  Service: Thoracic;  Laterality: Right;  . Video assisted thoracoscopy  03/17/2012    Procedure: VIDEO ASSISTED THORACOSCOPY;  Surgeon: Ivin Poot, MD;  Location: [facility];  Service: Thoracic;  Laterality: Right;  . Resection of apical bleb  03/17/2012    Procedure: RESECTION OF APICAL BLEB;  Surgeon: Ivin Poot, MD;  Location: [facility];  Service: Thoracic;  Laterality: Right;  stapling of bleb  . Colonoscopy N/A 04/29/2014    Procedure: COLONOSCOPY;  Surgeon: Daneil Dolin, MD;  [facility];  Service: Endoscopy;  Laterality: N/A;  1245pm  . Esophagogastroduodenoscopy N/A 04/29/2014    Procedure: ESOPHAGOGASTRODUODENOSCOPY (EGD);  Surgeon: Daneil Dolin, MD;  [facility];  Service: Endoscopy;  Laterality: N/A;  . Savory dilation N/A 04/29/2014    Procedure: SAVORY DILATION;  Surgeon: Daneil Dolin, MD;  [facility];  Service: Endoscopy;  Laterality: N/A;  Venia Minks dilation N/A 04/29/2014    Procedure: Venia Minks DILATION;  Surgeon: Daneil Dolin, MD;  [facility];  Service: Endoscopy;  Laterality: N/A;    REVIEW OF SYSTEMS:  Review of Systems  Constitutional: Positive for malaise/fatigue. Negative for fever, chills, weight loss and diaphoresis.  HENT: Negative for congestion, nosebleeds and sore throat.   Eyes: Negative for blurred vision, double vision, photophobia, pain, discharge and redness.  Respiratory: Positive for shortness of breath and wheezing. Negative for cough, hemoptysis, sputum production and stridor.   Cardiovascular: Negative for chest pain, palpitations, orthopnea, claudication, leg swelling and PND.  Gastrointestinal: Positive for nausea and diarrhea. Negative for heartburn, vomiting, abdominal pain, constipation, blood in stool and melena.  Genitourinary: Negative.   Musculoskeletal: Positive for back pain and joint pain. Negative for myalgias, falls and neck pain.  Skin:       "leathery skin" improving.   Neurological: Negative.   Psychiatric/Behavioral: Negative for depression. The patient is not nervous/anxious and does not have insomnia.      PHYSICAL  EXAMINATION: Physical Exam  Constitutional: She is oriented to person, place, and time and well-developed, well-nourished, and in no distress.  HENT:  Head: Normocephalic and atraumatic.  Mouth/Throat: Oropharynx is clear and moist.  Eyes: Pupils are equal, round, and reactive to light.  Neck: Normal range of motion. Neck supple. No JVD present. No tracheal deviation present. No thyromegaly present.  Cardiovascular: Normal rate, regular rhythm, normal heart sounds and intact distal pulses.  Exam reveals no gallop and no friction rub.   No murmur heard. Pulmonary/Chest: Effort normal and breath sounds normal. No respiratory distress. She has no wheezes. She has no rales.  Patient is on home oxygen via nasal cannula  Abdominal: Soft. Bowel sounds are normal. She exhibits no distension and no mass. There is no tenderness.  Musculoskeletal: Normal range of motion. She  exhibits no edema or tenderness.  Lymphadenopathy:    She has no cervical adenopathy.  Neurological: She is alert and oriented to person, place, and time. She has normal reflexes. Gait normal.  Skin: Skin is warm and dry. No rash noted.    ECOG PERFORMANCE STATUS: 1 - Symptomatic but completely ambulatory  Blood pressure 152/75, pulse 96, temperature 98.2 F (36.8 C), temperature source Oral, resp. rate 19, height 5\' 4"  (1.626 m), weight 126 lb 14.4 oz (57.561 kg), SpO2 100 %.  LABORATORY DATA: Lab Results  Component Value Date   WBC 3.9 06/19/2014   HGB 9.8* 06/19/2014   HCT 30.1* 06/19/2014   MCV 87.0 06/19/2014   PLT 194 06/19/2014      Chemistry      Component Value Date/Time   NA 139 06/19/2014 0820   NA 131* 03/25/2012 1137   K 4.0 06/19/2014 0820   K 3.8 03/25/2012 1137   CL 91* 03/25/2012 1137   CO2 26 06/19/2014 0820   CO2 31 03/25/2012 1137   BUN 15.7 06/19/2014 0820   BUN 23 03/14/2013 1212   CREATININE 1.1 06/19/2014 0820   CREATININE 1.13* 03/14/2013 1210   CREATININE 0.91 03/25/2012 1137       Component Value Date/Time   CALCIUM 8.7 06/19/2014 0820   CALCIUM 8.4 03/25/2012 1137   ALKPHOS 102 06/19/2014 0820   ALKPHOS 86 03/25/2012 1137   AST 13 06/19/2014 0820   AST 28 03/25/2012 1137   ALT 8 06/19/2014 0820   ALT 17 03/25/2012 1137   BILITOT 0.70 06/19/2014 0820   BILITOT 0.4 03/25/2012 1137       RADIOGRAPHIC STUDIES:  Mr Jeri Cos Wo Contrast  06/01/2014   CLINICAL DATA:  Right upper lobe lung cancer. Difficulty walking. Vision problems. Headaches.  EXAM: MRI HEAD WITHOUT AND WITH CONTRAST  TECHNIQUE: Multiplanar, multiecho pulse sequences of the brain and surrounding structures were obtained without and with intravenous contrast.  CONTRAST:  8 mL MultiHance  COMPARISON:  CT head without and with contrast 03/16/2012.  FINDINGS: No acute infarct, hemorrhage, or mass lesion is present. The postcontrast images demonstrate no pathologic enhancement to suggest metastatic disease of the brain or meninges.  Multiple remote lacunar infarcts are present in the basal ganglia bilaterally, particularly the anterior right lentiform nucleus and caudate head. Extensive periventricular and subcortical white matter changes are advanced for age. The ventricles are proportionate to the degree of atrophy. White matter changes extend into the brainstem. There are remote lacunar infarcts within the left cerebellum.  Flow is present in the major intracranial arteries. The globes and orbits are intact. The paranasal sinuses and mastoid air cells are clear.  Skullbase is within normal limits. Midline structures are normal. The sella is within normal limits. The optic chiasm is normal.  Asymmetric degenerative changes are present at the right atlantooccipital joint.  IMPRESSION: 1. No acute intracranial abnormality or evidence for metastatic disease. 2. 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 3. White matter changes  extend into the brainstem. Remote lacunar infarcts are present in the left cerebellum. 4. Extensive atrophy and white matter disease likely reflects the sequela of chronic microvascular ischemia. 5. Asymmetric degenerative changes at the right atlantooccipital joint P   Electronically Signed   By: San Morelle M.D.   On: 06/01/2014 14:06   Nm Pet Image Restag (ps) Skull Base To Thigh  05/29/2014   CLINICAL DATA:  Subsequent  treatment strategy for lung cancer. Restaging examination.  EXAM: NUCLEAR MEDICINE PET SKULL BASE TO THIGH  TECHNIQUE: 6.8 mCi F-18 FDG was injected intravenously. Full-ring PET imaging was performed from the skull base to thigh after the radiotracer. CT data was obtained and used for attenuation correction and anatomic localization.  FASTING BLOOD GLUCOSE:  Value: 115 mg/dl  COMPARISON:  Chest CT 03/27/2014.  FINDINGS: NECK  No hypermetabolic lymph nodes in the neck.  CHEST  Postoperative changes of wedge resection in the right upper lobe are noted. Adjacent to the suture line there is a hypermetabolic (SUVmax = 30.1)SWFU that currently measures 3.1 x 2.2 cm (image 15 of series 8), which is also associated with the superior aspect of the major fissure. More inferiorly there are some amorphous areas of hypermetabolism corresponding to areas of ground-glass attenuation (SUVmax = 2.8). On image 27 of series 8 there is an additional 11 mm hypermetabolic (SUVmax = 9.3)ATFTD upper lobe nodule that appears similar in size to prior study 03/07/2014, also adjacent to the suture line. Additionally, in the medial aspect of the right upper lobe (image 59 of series 4) there is a pleural-based 1.7 x 1.2 cm hypermetabolic (SUVmax = 32.2) nodule. No definite hypermetabolic mediastinal or hilar lymph nodes are noted. Heart size is mildly enlarged. There is no significant pericardial fluid, thickening or pericardial calcification. There is atherosclerosis of the thoracic aorta, the great vessels of the  mediastinum and the coronary arteries, including calcified atherosclerotic plaque in the left main, left anterior descending, left circumflex and right coronary arteries. Dilatation of the pulmonic trunk (4.1 cm in diameter), suggestive of pulmonary arterial hypertension. Moderate centrilobular emphysema redemonstrated.  ABDOMEN/PELVIS  No abnormal hypermetabolic activity within the liver, pancreas, adrenal glands, or spleen. No hypermetabolic lymph nodes in the abdomen or pelvis. Status post cholecystectomy. Extensive calcifications throughout the pancreatic parenchyma, presumably related to chronic pancreatitis. There are some subtle inflammatory changes around the pancreas on today's examination, which could indicate acute pancreatitis. Additionally, there is a 2.8 x 1.6 cm low-attenuation area in the head of the pancreas, which demonstrates no hypermetabolism on the PET portion of the examination, which may represent a pancreatic pseudocyst, or may simply represent a dilated pancreatic duct. This is new compared to the prior examination, and a cystic neoplasm is felt less likely. Spleen appears borderline enlarged measuring 12.8 x 4.4 x 13.0 cm (estimated splenic volume of 366 mL). Extensive atherosclerosis throughout the abdominal and pelvic vasculature, including a fusiform infrarenal abdominal aortic aneurysm which measures up to 3.5 x 4.5 cm in diameter, only slightly larger than the prior study from 10/31/2012 at which point it measured 3.5 x 4.2 cm when measured in a similar fashion. Pelvic floor laxity with widening of the levator hiatus, suggesting pelvic organ prolapse. Status post hysterectomy. Ovaries are not confidently identified may be surgically absent or atrophic.  SKELETON  No focal hypermetabolic activity to suggest skeletal metastasis.  IMPRESSION: 1. Findings are concerning for local recurrence of disease adjacent to the suture line in the right upper lobe where there is a pleural-based 3.1 x  2.2 cm hypermetabolic mass, with apparent dissemination along with the pleural lymphatics in the right upper lobe, with at least 2 separate hypermetabolic nodules both medially and laterally, as detailed above. No hilar or mediastinal lymphadenopathy noted at this time. Additionally, there is no evidence of metastatic disease in the neck, abdomen or pelvis. 2. Peripancreatic inflammatory changes, new compared to the prior study. This is concerning for acute on  chronic pancreatitis, and clinical correlation is recommended. 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 3. Atherosclerosis, including left main and 3 vessel coronary artery disease. In addition, there is a 4.5 x 3.5 cm fusiform infrarenal abdominal aortic aneurysm. Recommend followup by ultrasound in 1 year. This recommendation follows ACR consensus guidelines: White Paper of the ACR Incidental Findings Committee II on Vascular Findings. J Am Coll Radiol 2013; 10:789-794. 4. Additional incidental findings, as above, similar prior studies.   Electronically Signed   By: Vinnie Langton M.D.   On: 05/29/2014 12:39     ASSESSMENT/PLAN:  No problem-specific assessment & plan notes found for this encounter. Patient is a pleasant 74 year old Caucasian female recently presenting with recurrent lung cancer. She is being treated with Tarceva 150 mg by mouth daily with therapy beginning 05/22/2014. She continues to tolerate this treatment well. Her dry skin and diarrhea are improving with the abovementioned treatments. She will continue on the current regimen as ordered. She will return in 2 weeks for a symptom management visit and labs. She understands and agrees with this plan. She has been encouraged to call with any issues that might arise before her next visit here.   Laurie Panda,  NP 06/19/2014

## 2014-06-19 NOTE — Telephone Encounter (Signed)
appts made and avs printed for pt  anne °

## 2014-07-08 ENCOUNTER — Ambulatory Visit (HOSPITAL_BASED_OUTPATIENT_CLINIC_OR_DEPARTMENT_OTHER): Payer: PPO | Admitting: Physician Assistant

## 2014-07-08 ENCOUNTER — Telehealth: Payer: Self-pay | Admitting: Physician Assistant

## 2014-07-08 ENCOUNTER — Encounter: Payer: Self-pay | Admitting: Physician Assistant

## 2014-07-08 ENCOUNTER — Other Ambulatory Visit (HOSPITAL_BASED_OUTPATIENT_CLINIC_OR_DEPARTMENT_OTHER): Payer: PPO

## 2014-07-08 VITALS — BP 152/57 | HR 78 | Temp 97.7°F | Resp 18 | Ht 64.0 in | Wt 129.9 lb

## 2014-07-08 DIAGNOSIS — R197 Diarrhea, unspecified: Secondary | ICD-10-CM | POA: Diagnosis not present

## 2014-07-08 DIAGNOSIS — R0602 Shortness of breath: Secondary | ICD-10-CM

## 2014-07-08 DIAGNOSIS — C3411 Malignant neoplasm of upper lobe, right bronchus or lung: Secondary | ICD-10-CM

## 2014-07-08 DIAGNOSIS — C3410 Malignant neoplasm of upper lobe, unspecified bronchus or lung: Secondary | ICD-10-CM

## 2014-07-08 DIAGNOSIS — R21 Rash and other nonspecific skin eruption: Secondary | ICD-10-CM | POA: Diagnosis not present

## 2014-07-08 LAB — COMPREHENSIVE METABOLIC PANEL (CC13)
ALT: 8 U/L (ref 0–55)
ANION GAP: 10 meq/L (ref 3–11)
AST: 13 U/L (ref 5–34)
Albumin: 3.5 g/dL (ref 3.5–5.0)
Alkaline Phosphatase: 106 U/L (ref 40–150)
BUN: 12.3 mg/dL (ref 7.0–26.0)
CO2: 25 meq/L (ref 22–29)
CREATININE: 0.9 mg/dL (ref 0.6–1.1)
Calcium: 8.6 mg/dL (ref 8.4–10.4)
Chloride: 106 mEq/L (ref 98–109)
EGFR: 68 mL/min/{1.73_m2} — AB (ref >90)
Glucose: 105 mg/dl (ref 70–140)
POTASSIUM: 4 meq/L (ref 3.5–5.1)
Sodium: 141 mEq/L (ref 136–145)
Total Bilirubin: 0.74 mg/dL (ref 0.20–1.20)
Total Protein: 5.9 g/dL — ABNORMAL LOW (ref 6.4–8.3)

## 2014-07-08 LAB — CBC WITH DIFFERENTIAL/PLATELET
BASO%: 0.4 % (ref 0.0–2.0)
Basophils Absolute: 0 10*3/uL (ref 0.0–0.1)
EOS ABS: 0.5 10*3/uL (ref 0.0–0.5)
EOS%: 10.5 % — ABNORMAL HIGH (ref 0.0–7.0)
HEMATOCRIT: 29.7 % — AB (ref 34.8–46.6)
HGB: 9.4 g/dL — ABNORMAL LOW (ref 11.6–15.9)
LYMPH#: 1.2 10*3/uL (ref 0.9–3.3)
LYMPH%: 25.4 % (ref 14.0–49.7)
MCH: 27.8 pg (ref 25.1–34.0)
MCHC: 31.6 g/dL (ref 31.5–36.0)
MCV: 87.9 fL (ref 79.5–101.0)
MONO#: 0.4 10*3/uL (ref 0.1–0.9)
MONO%: 8.2 % (ref 0.0–14.0)
NEUT#: 2.6 10*3/uL (ref 1.5–6.5)
NEUT%: 55.5 % (ref 38.4–76.8)
PLATELETS: 232 10*3/uL (ref 145–400)
RBC: 3.38 10*6/uL — ABNORMAL LOW (ref 3.70–5.45)
RDW: 13.9 % (ref 11.2–14.5)
WBC: 4.7 10*3/uL (ref 3.9–10.3)

## 2014-07-08 NOTE — Progress Notes (Addendum)
No images are attached to the encounter. No scans are attached to the encounter. No scans are attached to the encounter. [facility] VISIT PROGRESS NOTE  Glo Herring., MD [place] [address]  DIAGNOSIS: Lung cancer, upper lobe   Staging form: Lung, AJCC 7th Edition     Clinical stage from 05/16/2014: Stage IV (T3, N0, M1a) - Signed by Curt Bears, MD on 05/16/2014  PRIOR THERAPY: none  CURRENT THERAPY: Tarceva 150 mg by mouth daily. First dose 05/22/2014  INTERVAL HISTORY: [name] 74 y.o. female returns for a scheduled regular symptom management visit for followup of her recurrent lung cancer. She is accompanied by several family members today. She is being treated with Tarceva 150 mg by mouth daily with therapy beginning 05/22/2014. She is now status post approximate 6 weeks of therapy. Overall she's tolerating this medication without difficulty. She has a few episodes of diarrhea that are well managed with Imodium. She has not had much of a distinct rash however has very dry skin associated with the Tarceva therapy. She reports that she has approximately 2 weeks of Tarceva remaining in her current bottle and only has 1 more refill before her current financial coverage runs out. She is hoping that she will receive another grant or other age in continuing on this medication.  She voiced no other specific complaints today. She denied fever, chills, cough or hemoptysis. She continues on home oxygen.  MEDICAL HISTORY: Past Medical History  Diagnosis Date  . COPD (chronic obstructive pulmonary disease)   . Thyroid disease   . Pernicious anemia   . Hypertension   . Lung cancer     ALLERGIES:  is allergic to codeine and penicillins.  MEDICATIONS:  Current Outpatient Prescriptions  Medication Sig Dispense Refill  . acetaminophen (TYLENOL) 500 MG tablet Take 500 mg by mouth every 6 (six) hours as needed.    Marland Kitchen albuterol (PROVENTIL) (2.5  MG/3ML) 0.083% nebulizer solution Take 2.5 mg by nebulization every 4 (four) hours.     . ALPRAZolam (XANAX) 1 MG tablet Take 1 mg by mouth 4 (four) times daily as needed. For anxiety and/or sleep    . clindamycin (CLEOCIN T) 1 % external solution Apply topically 2 (two) times daily. 30 mL 1  . erlotinib (TARCEVA) 150 MG tablet Take 1 tablet (150 mg total) by mouth daily. Take on an empty stomach 1 hour before meals or 2 hours after. 30 tablet 2  . ferrous sulfate 325 (65 FE) MG tablet Take 325 mg by mouth 3 (three) times daily with meals.    . Fluticasone-Salmeterol (ADVAIR) 250-50 MCG/DOSE AEPB Inhale 1 puff into the lungs every 12 (twelve) hours.    Marland Kitchen guaiFENesin (MUCINEX) 600 MG 12 hr tablet Take 600 mg by mouth daily as needed for cough.     . levothyroxine (SYNTHROID, LEVOTHROID) 100 MCG tablet Take 100 mcg by mouth daily before breakfast.     . loperamide (IMODIUM) 2 MG capsule Take 2 mg by mouth as needed for diarrhea or loose stools.    Marland Kitchen loratadine (CLARITIN) 10 MG tablet Take 10 mg by mouth every morning.    . NON FORMULARY OXYGEN      24/7    . NON FORMULARY Co Q 10   One tablet daily    . NON FORMULARY Magnesium 250 mg    One tablet daily    . potassium chloride SA (K-DUR,KLOR-CON) 20 MEQ tablet Take 20 mEq by mouth  daily.    . torsemide (DEMADEX) 20 MG tablet Take 10 mg by mouth daily.     . vitamin B-12 (CYANOCOBALAMIN) 1000 MCG tablet Take 1,000 mcg by mouth daily.    . Vitamin D, Ergocalciferol, (DRISDOL) 50000 UNITS CAPS capsule Take 50,000 Units by mouth every 7 (seven) days. Fridays     No current facility-administered medications for this visit.    SURGICAL HISTORY:  Past Surgical History  Procedure Laterality Date  . Abdominal hysterectomy    . Appendectomy    . Cholecystectomy    . Tubal ligation    . Chest tube insertion  03/08/2012    Procedure: CHEST TUBE INSERTION;  Surgeon: Ivin Poot, MD;  Location: [facility];  Service: Thoracic;  Laterality: Right;  . Video  assisted thoracoscopy  03/17/2012    Procedure: VIDEO ASSISTED THORACOSCOPY;  Surgeon: Ivin Poot, MD;  Location: [facility];  Service: Thoracic;  Laterality: Right;  . Resection of apical bleb  03/17/2012    Procedure: RESECTION OF APICAL BLEB;  Surgeon: Ivin Poot, MD;  Location: [facility];  Service: Thoracic;  Laterality: Right;  stapling of bleb  . Colonoscopy N/A 04/29/2014    Procedure: COLONOSCOPY;  Surgeon: Daneil Dolin, MD;  [facility];  Service: Endoscopy;  Laterality: N/A;  1245pm  . Esophagogastroduodenoscopy N/A 04/29/2014    Procedure: ESOPHAGOGASTRODUODENOSCOPY (EGD);  Surgeon: Daneil Dolin, MD;  [facility];  Service: Endoscopy;  Laterality: N/A;  . Savory dilation N/A 04/29/2014    Procedure: SAVORY DILATION;  Surgeon: Daneil Dolin, MD;  [facility];  Service: Endoscopy;  Laterality: N/A;  Venia Minks dilation N/A 04/29/2014    Procedure: Venia Minks DILATION;  Surgeon: Daneil Dolin, MD;  [facility];  Service: Endoscopy;  Laterality: N/A;    REVIEW OF SYSTEMS:  Review of Systems  Constitutional: Negative for fever, chills, weight loss, malaise/fatigue and diaphoresis.  HENT: Negative for congestion, ear discharge, ear pain, hearing loss, nosebleeds, sore throat and tinnitus.   Eyes: Negative for blurred vision, double vision, photophobia, pain, discharge and redness.  Respiratory: Positive for shortness of breath. Negative for cough, hemoptysis, sputum production, wheezing and stridor.   Cardiovascular: Negative for chest pain, palpitations, orthopnea, claudication, leg swelling and PND.  Gastrointestinal: Positive for diarrhea. Negative for heartburn, nausea, vomiting, abdominal pain, constipation, blood in stool and melena.  Genitourinary: Negative.   Musculoskeletal: Negative.   Skin: Negative.        Generally very dry skin  Neurological: Negative for dizziness, tingling, focal weakness, seizures, weakness and headaches.   Endo/Heme/Allergies: Does not bruise/bleed easily.  Psychiatric/Behavioral: Negative for depression. The patient is not nervous/anxious and does not have insomnia.      PHYSICAL EXAMINATION: Physical Exam  Constitutional: She is oriented to person, place, and time and well-developed, well-nourished, and in no distress.  HENT:  Head: Normocephalic and atraumatic.  Mouth/Throat: Oropharynx is clear and moist.  Eyes: Pupils are equal, round, and reactive to light.  Neck: Normal range of motion. Neck supple. No JVD present. No tracheal deviation present. No thyromegaly present.  Cardiovascular: Normal rate, regular rhythm, normal heart sounds and intact distal pulses.  Exam reveals no gallop and no friction rub.   No murmur heard. Pulmonary/Chest: Effort normal and breath sounds normal. No respiratory distress. She has no wheezes. She has no rales.  Patient is on home oxygen via nasal cannula  Abdominal: Soft. Bowel sounds are normal. She exhibits no  distension and no mass. There is no tenderness.  Musculoskeletal: Normal range of motion. She exhibits no edema or tenderness.  Lymphadenopathy:    She has no cervical adenopathy.  Neurological: She is alert and oriented to person, place, and time. She has normal reflexes. Gait normal.  Skin: Skin is warm and dry. No rash noted.    ECOG PERFORMANCE STATUS: 1 - Symptomatic but completely ambulatory  Blood pressure 152/57, pulse 78, temperature 97.7 F (36.5 C), temperature source Oral, resp. rate 18, height _0  (1.626 m), weight 129 lb 14.4 oz (58.922 kg), SpO2 100 %, peak flow 3 L/min.  LABORATORY DATA: Lab Results  Component Value Date   WBC 4.7 07/08/2014   HGB 9.4* 07/08/2014   HCT 29.7* 07/08/2014   MCV 87.9 07/08/2014   PLT 232 07/08/2014      Chemistry      Component Value Date/Time   NA 141 07/08/2014 0834   NA 131* 03/25/2012 1137   K 4.0 07/08/2014 0834   K 3.8 03/25/2012 1137   CL 91* 03/25/2012 1137   CO2 25  07/08/2014 0834   CO2 31 03/25/2012 1137   BUN 12.3 07/08/2014 0834   BUN 23 03/14/2013 1212   CREATININE 0.9 07/08/2014 0834   CREATININE 1.13* 03/14/2013 1210   CREATININE 0.91 03/25/2012 1137      Component Value Date/Time   CALCIUM 8.6 07/08/2014 0834   CALCIUM 8.4 03/25/2012 1137   ALKPHOS 106 07/08/2014 0834   ALKPHOS 86 03/25/2012 1137   AST 13 07/08/2014 0834   AST 28 03/25/2012 1137   ALT 8 07/08/2014 0834   ALT 17 03/25/2012 1137   BILITOT 0.74 07/08/2014 0834   BILITOT 0.4 03/25/2012 1137       RADIOGRAPHIC STUDIES:  No results found.   ASSESSMENT/PLAN:  No problem-specific assessment & plan notes found for this encounter. Patient is a pleasant 74 year old Caucasian female recently presenting with recurrent lung cancer. She is being treated with Tarceva 150 mg by mouth daily with therapy beginning 05/22/2014. Overall she's tolerating the treatment relatively well with the exception of very dry skin and a few episodes of diarrhea that are well managed with Imodium. Patient was discussed with also seen by Dr. Julien Nordmann. She has a proximally 6 weeks of Tarceva remaining. I have advised her to contact her Raquel Marlon Pel in her financial advocacy department regarding other means of covering the cost of Tarceva. She'll follow-up in one month for another symptom management visit with a repeat CBC differential and C met.  She will continue on Tarceva 150 mg by mouth daily.   Awilda Metro E, PA-C 07/08/2014  All questions were answered. The patient knows to call the clinic with any problems, questions or concerns. We can certainly see the patient much sooner if necessary.  ADDENDUM: Hematology/Oncology Attending: I had a face to face encounter with the patient. I recommended her care plan. This is a very pleasant 74 years old white female with stage IV non-small cell lung cancer, adenocarcinoma with positive EGFR mutation was currently undergoing treatment with oral Tarceva  150 mg by mouth daily. She is tolerating the treatment fairly well with no significant adverse effects except for mild skin rash. She also has few episodes of diarrhea but this will controlled with Imodium.. I recommended for the patient to continue her current treatment with Tarceva. Would see her back for follow-up visit in one month's for reevaluation with repeat blood work. The patient was advised to call immediately if  she has any concerning symptoms in the interval.  Disclaimer: This note was dictated with voice recognition software. Similar sounding words can inadvertently be transcribed and may be missed upon review. Eilleen Kempf., MD 07/09/2014

## 2014-07-08 NOTE — Telephone Encounter (Signed)
Gave avs & calednar for May.

## 2014-07-08 NOTE — Patient Instructions (Signed)
Continue taking Tarceva 150 mg by mouth daily Follow-up in one month

## 2014-08-06 ENCOUNTER — Ambulatory Visit (HOSPITAL_BASED_OUTPATIENT_CLINIC_OR_DEPARTMENT_OTHER): Payer: PPO | Admitting: Internal Medicine

## 2014-08-06 ENCOUNTER — Telehealth: Payer: Self-pay | Admitting: Internal Medicine

## 2014-08-06 ENCOUNTER — Encounter: Payer: Self-pay | Admitting: Internal Medicine

## 2014-08-06 ENCOUNTER — Other Ambulatory Visit (HOSPITAL_BASED_OUTPATIENT_CLINIC_OR_DEPARTMENT_OTHER): Payer: PPO

## 2014-08-06 ENCOUNTER — Encounter: Payer: Self-pay | Admitting: *Deleted

## 2014-08-06 VITALS — BP 164/65 | HR 79 | Temp 97.9°F | Resp 18 | Ht 64.0 in | Wt 126.1 lb

## 2014-08-06 DIAGNOSIS — C3411 Malignant neoplasm of upper lobe, right bronchus or lung: Secondary | ICD-10-CM

## 2014-08-06 DIAGNOSIS — C3410 Malignant neoplasm of upper lobe, unspecified bronchus or lung: Secondary | ICD-10-CM

## 2014-08-06 LAB — COMPREHENSIVE METABOLIC PANEL (CC13)
ALBUMIN: 3.5 g/dL (ref 3.5–5.0)
ALT: 10 U/L (ref 0–55)
AST: 16 U/L (ref 5–34)
Alkaline Phosphatase: 129 U/L (ref 40–150)
Anion Gap: 11 mEq/L (ref 3–11)
BUN: 16.2 mg/dL (ref 7.0–26.0)
CALCIUM: 8.4 mg/dL (ref 8.4–10.4)
CHLORIDE: 107 meq/L (ref 98–109)
CO2: 25 mEq/L (ref 22–29)
Creatinine: 0.9 mg/dL (ref 0.6–1.1)
EGFR: 63 mL/min/{1.73_m2} — ABNORMAL LOW (ref >90)
Glucose: 104 mg/dl (ref 70–140)
POTASSIUM: 3.2 meq/L — AB (ref 3.5–5.1)
Sodium: 142 mEq/L (ref 136–145)
Total Bilirubin: 0.65 mg/dL (ref 0.20–1.20)
Total Protein: 6 g/dL — ABNORMAL LOW (ref 6.4–8.3)

## 2014-08-06 LAB — CBC WITH DIFFERENTIAL/PLATELET
BASO%: 1 % (ref 0.0–2.0)
BASOS ABS: 0 10*3/uL (ref 0.0–0.1)
EOS%: 12.8 % — ABNORMAL HIGH (ref 0.0–7.0)
Eosinophils Absolute: 0.6 10*3/uL — ABNORMAL HIGH (ref 0.0–0.5)
HCT: 30.1 % — ABNORMAL LOW (ref 34.8–46.6)
HEMOGLOBIN: 9.7 g/dL — AB (ref 11.6–15.9)
LYMPH#: 1 10*3/uL (ref 0.9–3.3)
LYMPH%: 21 % (ref 14.0–49.7)
MCH: 27.2 pg (ref 25.1–34.0)
MCHC: 32.3 g/dL (ref 31.5–36.0)
MCV: 84.1 fL (ref 79.5–101.0)
MONO#: 0.4 10*3/uL (ref 0.1–0.9)
MONO%: 8.4 % (ref 0.0–14.0)
NEUT#: 2.6 10*3/uL (ref 1.5–6.5)
NEUT%: 56.8 % (ref 38.4–76.8)
Platelets: 253 10*3/uL (ref 145–400)
RBC: 3.58 10*6/uL — ABNORMAL LOW (ref 3.70–5.45)
RDW: 14.7 % — AB (ref 11.2–14.5)
WBC: 4.7 10*3/uL (ref 3.9–10.3)

## 2014-08-06 NOTE — Progress Notes (Signed)
[facility] Telephone:(336) [phone]   Fax:(336) [phone]  OFFICE PROGRESS NOTE  Glo Herring., MD [place] [address]  DIAGNOSIS: Recurrent non-small cell lung cancer, adenocarcinoma with positive EGFR mutation in exon 21 ([id number]). The patient has multifocal disease including 2 nodules in the right lung with a suspicious tiny lesion in the left lung.  PRIOR THERAPY: None.  CURRENT THERAPY: Tarceva 150 mg by mouth daily status post 2 months of treatment.  INTERVAL HISTORY: [name] 74 y.o. female returns to the clinic today for follow-up visit accompanied by her daughter and son. The patient is feeling fine today with no specific complaints except for mild fatigue and shortness of breath. She is currently on home oxygen. The patient denied having any significant chest pain, cough or hemoptysis. She has no significant weight loss or night sweats. She is tolerating her treatment with oral Tarceva fairly well with no significant adverse effects. She denied having any significant skin rash or diarrhea. She has intermittent heartburn. The patient is here today for evaluation and repeat blood work.  MEDICAL HISTORY: Past Medical History  Diagnosis Date  . COPD (chronic obstructive pulmonary disease)   . Thyroid disease   . Pernicious anemia   . Hypertension   . Lung cancer     ALLERGIES:  is allergic to codeine and penicillins.  MEDICATIONS:  Current Outpatient Prescriptions  Medication Sig Dispense Refill  . acetaminophen (TYLENOL) 500 MG tablet Take 500 mg by mouth every 6 (six) hours as needed.    Marland Kitchen albuterol (PROVENTIL) (2.5 MG/3ML) 0.083% nebulizer solution Take 2.5 mg by nebulization every 4 (four) hours.     . ALPRAZolam (XANAX) 1 MG tablet Take 1 mg by mouth 4 (four) times daily as needed. For anxiety and/or sleep    . clindamycin (CLEOCIN T) 1 % external solution Apply topically 2 (two) times daily. 30 mL 1  . erlotinib (TARCEVA)  150 MG tablet Take 1 tablet (150 mg total) by mouth daily. Take on an empty stomach 1 hour before meals or 2 hours after. 30 tablet 2  . ferrous sulfate 325 (65 FE) MG tablet Take 325 mg by mouth 3 (three) times daily with meals.    . Fluticasone-Salmeterol (ADVAIR) 250-50 MCG/DOSE AEPB Inhale 1 puff into the lungs every 12 (twelve) hours.    Marland Kitchen guaiFENesin (MUCINEX) 600 MG 12 hr tablet Take 600 mg by mouth daily as needed for cough.     . levothyroxine (SYNTHROID, LEVOTHROID) 100 MCG tablet Take 100 mcg by mouth daily before breakfast.     . loperamide (IMODIUM) 2 MG capsule Take 2 mg by mouth as needed for diarrhea or loose stools.    Marland Kitchen loratadine (CLARITIN) 10 MG tablet Take 10 mg by mouth every morning.    . NON FORMULARY OXYGEN      24/7    . NON FORMULARY Co Q 10   One tablet daily    . NON FORMULARY Magnesium 250 mg    One tablet daily    . potassium chloride SA (K-DUR,KLOR-CON) 20 MEQ tablet Take 20 mEq by mouth daily.    Marland Kitchen torsemide (DEMADEX) 20 MG tablet Take 10 mg by mouth daily.     . vitamin B-12 (CYANOCOBALAMIN) 1000 MCG tablet Take 1,000 mcg by mouth daily.    . Vitamin D, Ergocalciferol, (DRISDOL) 50000 UNITS CAPS capsule Take 50,000 Units by mouth every 7 (seven) days. Fridays     No current  facility-administered medications for this visit.    SURGICAL HISTORY:  Past Surgical History  Procedure Laterality Date  . Abdominal hysterectomy    . Appendectomy    . Cholecystectomy    . Tubal ligation    . Chest tube insertion  03/08/2012    Procedure: CHEST TUBE INSERTION;  Surgeon: Ivin Poot, MD;  Location: [facility];  Service: Thoracic;  Laterality: Right;  . Video assisted thoracoscopy  03/17/2012    Procedure: VIDEO ASSISTED THORACOSCOPY;  Surgeon: Ivin Poot, MD;  Location: [facility];  Service: Thoracic;  Laterality: Right;  . Resection of apical bleb  03/17/2012    Procedure: RESECTION OF APICAL BLEB;  Surgeon: Ivin Poot, MD;  Location: [facility];  Service: Thoracic;   Laterality: Right;  stapling of bleb  . Colonoscopy N/A 04/29/2014    Procedure: COLONOSCOPY;  Surgeon: Daneil Dolin, MD;  [facility];  Service: Endoscopy;  Laterality: N/A;  1245pm  . Esophagogastroduodenoscopy N/A 04/29/2014    Procedure: ESOPHAGOGASTRODUODENOSCOPY (EGD);  Surgeon: Daneil Dolin, MD;  [facility];  Service: Endoscopy;  Laterality: N/A;  . Savory dilation N/A 04/29/2014    Procedure: SAVORY DILATION;  Surgeon: Daneil Dolin, MD;  [facility];  Service: Endoscopy;  Laterality: N/A;  Venia Minks dilation N/A 04/29/2014    Procedure: Venia Minks DILATION;  Surgeon: Daneil Dolin, MD;  [facility];  Service: Endoscopy;  Laterality: N/A;    REVIEW OF SYSTEMS:  A comprehensive review of systems was negative except for: Constitutional: positive for fatigue Respiratory: positive for dyspnea on exertion Gastrointestinal: positive for dyspepsia   PHYSICAL EXAMINATION: General appearance: alert, cooperative, fatigued and no distress Head: Normocephalic, without obvious abnormality, atraumatic Neck: no adenopathy, no JVD, supple, symmetrical, trachea midline and thyroid not enlarged, symmetric, no tenderness/mass/nodules Lymph nodes: Cervical, supraclavicular, and axillary nodes normal. Resp: clear to auscultation bilaterally Back: symmetric, no curvature. ROM normal. No CVA tenderness. Cardio: regular rate and rhythm, S1, S2 normal, no murmur, click, rub or gallop GI: soft, non-tender; bowel sounds normal; no masses,  no organomegaly Extremities: extremities normal, atraumatic, no cyanosis or edema  ECOG PERFORMANCE STATUS: 1 - Symptomatic but completely ambulatory  Blood pressure 164/65, pulse 79, temperature 97.9 F (36.6 C), temperature source Oral, resp. rate 18, height 5' 4"  (1.626 m), weight 126 lb 1.6 oz (57.199 kg), SpO2 100 %.  LABORATORY DATA: Lab Results  Component Value Date   WBC 4.7 08/06/2014   HGB 9.7* 08/06/2014    HCT 30.1* 08/06/2014   MCV 84.1 08/06/2014   PLT 253 08/06/2014      Chemistry      Component Value Date/Time   NA 141 07/08/2014 0834   NA 131* 03/25/2012 1137   K 4.0 07/08/2014 0834   K 3.8 03/25/2012 1137   CL 91* 03/25/2012 1137   CO2 25 07/08/2014 0834   CO2 31 03/25/2012 1137   BUN 12.3 07/08/2014 0834   BUN 23 03/14/2013 1212   CREATININE 0.9 07/08/2014 0834   CREATININE 1.13* 03/14/2013 1210   CREATININE 0.91 03/25/2012 1137      Component Value Date/Time   CALCIUM 8.6 07/08/2014 0834   CALCIUM 8.4 03/25/2012 1137   ALKPHOS 106 07/08/2014 0834   ALKPHOS 86 03/25/2012 1137   AST 13 07/08/2014 0834   AST 28 03/25/2012 1137   ALT 8 07/08/2014 0834   ALT 17 03/25/2012 1137   BILITOT 0.74 07/08/2014 0834   BILITOT 0.4  03/25/2012 1137       RADIOGRAPHIC STUDIES: No results found.  ASSESSMENT AND PLAN: This is a very pleasant 74 years old with metastatic non-small cell lung cancer, adenocarcinoma with positive EGFR mutation who is currently undergoing treatment with Tarceva 150 mg by mouth daily status post 10 weeks of treatment. The patient is tolerating her treatment fairly well with no significant adverse effects. I recommended for her to continue her treatment with the same dose. I will arrange for the patient to have repeat CT scan of the chest, abdomen and pelvis in 2 weeks for reevaluation of her disease. For the heartburn, The patient was advised to use Zantac or Pepcid but at least 2 hours after her dose of Tarceva. She was advised to call immediately if she has any concerning symptoms in the interval. The patient voices understanding of current disease status and treatment options and is in agreement with the current care plan.  All questions were answered. The patient knows to call the clinic with any problems, questions or concerns. We can certainly see the patient much sooner if necessary.  Disclaimer: This note was dictated with voice recognition  software. Similar sounding words can inadvertently be transcribed and may not be corrected upon review.

## 2014-08-06 NOTE — Telephone Encounter (Signed)
per pof to sch pt appt-per Julien Nordmann reply we could sch appt on provider only spot-gave pt 11:00 appt-gave pt lab appt time & dtae-adv pt that [facility] would call to sch scans

## 2014-08-06 NOTE — Telephone Encounter (Signed)
per pof to sch pt appt-sent [facility] email to give time to see pt /no opened slots-adv pt AI woild call to adv of appt time & date once reply-adv [facility] will call to sch scans-pt understood

## 2014-08-06 NOTE — [facility] Oncology Navigator Note (Unsigned)
Spoke with patient today at cancer center.  She had a few questions for Dr. Julien Nordmann.  All concerns addressed.  No barriers identified.

## 2014-08-15 ENCOUNTER — Other Ambulatory Visit: Payer: Self-pay | Admitting: Internal Medicine

## 2014-08-15 ENCOUNTER — Other Ambulatory Visit (HOSPITAL_BASED_OUTPATIENT_CLINIC_OR_DEPARTMENT_OTHER): Payer: PPO

## 2014-08-15 DIAGNOSIS — C3411 Malignant neoplasm of upper lobe, right bronchus or lung: Secondary | ICD-10-CM | POA: Diagnosis not present

## 2014-08-15 DIAGNOSIS — C3410 Malignant neoplasm of upper lobe, unspecified bronchus or lung: Secondary | ICD-10-CM

## 2014-08-15 LAB — CBC WITH DIFFERENTIAL/PLATELET
BASO%: 0.9 % (ref 0.0–2.0)
Basophils Absolute: 0 10*3/uL (ref 0.0–0.1)
EOS%: 13.4 % — AB (ref 0.0–7.0)
Eosinophils Absolute: 0.7 10*3/uL — ABNORMAL HIGH (ref 0.0–0.5)
HEMATOCRIT: 29.9 % — AB (ref 34.8–46.6)
HEMOGLOBIN: 9.8 g/dL — AB (ref 11.6–15.9)
LYMPH%: 26.9 % (ref 14.0–49.7)
MCH: 27.9 pg (ref 25.1–34.0)
MCHC: 32.9 g/dL (ref 31.5–36.0)
MCV: 84.7 fL (ref 79.5–101.0)
MONO#: 0.4 10*3/uL (ref 0.1–0.9)
MONO%: 8.9 % (ref 0.0–14.0)
NEUT#: 2.5 10*3/uL (ref 1.5–6.5)
NEUT%: 49.9 % (ref 38.4–76.8)
Platelets: 238 10*3/uL (ref 145–400)
RBC: 3.53 10*6/uL — ABNORMAL LOW (ref 3.70–5.45)
RDW: 14.4 % (ref 11.2–14.5)
WBC: 5 10*3/uL (ref 3.9–10.3)
lymph#: 1.3 10*3/uL (ref 0.9–3.3)

## 2014-08-15 LAB — COMPREHENSIVE METABOLIC PANEL (CC13)
ALBUMIN: 3.1 g/dL — AB (ref 3.5–5.0)
ALT: 8 U/L (ref 0–55)
AST: 15 U/L (ref 5–34)
Alkaline Phosphatase: 128 U/L (ref 40–150)
Anion Gap: 11 mEq/L (ref 3–11)
BUN: 13.4 mg/dL (ref 7.0–26.0)
CALCIUM: 8.2 mg/dL — AB (ref 8.4–10.4)
CHLORIDE: 107 meq/L (ref 98–109)
CO2: 27 mEq/L (ref 22–29)
Creatinine: 0.9 mg/dL (ref 0.6–1.1)
EGFR: 64 mL/min/{1.73_m2} — AB (ref >90)
GLUCOSE: 104 mg/dL (ref 70–140)
POTASSIUM: 3.5 meq/L (ref 3.5–5.1)
Sodium: 144 mEq/L (ref 136–145)
Total Bilirubin: 0.57 mg/dL (ref 0.20–1.20)
Total Protein: 5.5 g/dL — ABNORMAL LOW (ref 6.4–8.3)

## 2014-08-20 ENCOUNTER — Ambulatory Visit (HOSPITAL_COMMUNITY)
Admission: RE | Admit: 2014-08-20 | Discharge: 2014-08-20 | Disposition: A | Discharge status: Home / Self Care | Payer: PPO | Source: Ambulatory Visit | Attending: Internal Medicine | Admitting: Internal Medicine

## 2014-08-20 ENCOUNTER — Encounter (HOSPITAL_COMMUNITY): Payer: Self-pay

## 2014-08-20 DIAGNOSIS — I77811 Abdominal aortic ectasia: Secondary | ICD-10-CM | POA: Diagnosis not present

## 2014-08-20 DIAGNOSIS — I708 Atherosclerosis of other arteries: Secondary | ICD-10-CM | POA: Diagnosis not present

## 2014-08-20 DIAGNOSIS — R05 Cough: Secondary | ICD-10-CM | POA: Insufficient documentation

## 2014-08-20 DIAGNOSIS — C3491 Malignant neoplasm of unspecified part of right bronchus or lung: Secondary | ICD-10-CM | POA: Diagnosis not present

## 2014-08-20 DIAGNOSIS — Z9981 Dependence on supplemental oxygen: Secondary | ICD-10-CM | POA: Diagnosis not present

## 2014-08-20 DIAGNOSIS — J449 Chronic obstructive pulmonary disease, unspecified: Secondary | ICD-10-CM | POA: Insufficient documentation

## 2014-08-20 DIAGNOSIS — K861 Other chronic pancreatitis: Secondary | ICD-10-CM | POA: Diagnosis not present

## 2014-08-20 DIAGNOSIS — R911 Solitary pulmonary nodule: Secondary | ICD-10-CM | POA: Diagnosis not present

## 2014-08-20 DIAGNOSIS — C3410 Malignant neoplasm of upper lobe, unspecified bronchus or lung: Secondary | ICD-10-CM

## 2014-08-20 DIAGNOSIS — Z87891 Personal history of nicotine dependence: Secondary | ICD-10-CM | POA: Diagnosis not present

## 2014-08-20 MED ORDER — IOHEXOL 300 MG/ML  SOLN
100.0000 mL | Freq: Once | INTRAMUSCULAR | Status: AC | PRN
  Administered 2014-08-20: 100 mL via INTRAVENOUS

## 2014-08-22 ENCOUNTER — Ambulatory Visit (HOSPITAL_BASED_OUTPATIENT_CLINIC_OR_DEPARTMENT_OTHER): Payer: PPO | Admitting: Internal Medicine

## 2014-08-22 ENCOUNTER — Encounter: Payer: Self-pay | Admitting: Internal Medicine

## 2014-08-22 ENCOUNTER — Telehealth: Payer: Self-pay | Admitting: Internal Medicine

## 2014-08-22 ENCOUNTER — Other Ambulatory Visit: Payer: PPO

## 2014-08-22 VITALS — BP 174/71 | HR 89 | Temp 98.0°F | Resp 22 | Ht 64.0 in | Wt 124.5 lb

## 2014-08-22 DIAGNOSIS — R12 Heartburn: Secondary | ICD-10-CM

## 2014-08-22 DIAGNOSIS — C3411 Malignant neoplasm of upper lobe, right bronchus or lung: Secondary | ICD-10-CM

## 2014-08-22 DIAGNOSIS — C3410 Malignant neoplasm of upper lobe, unspecified bronchus or lung: Secondary | ICD-10-CM

## 2014-08-22 MED ORDER — OXYCODONE-ACETAMINOPHEN 5-325 MG PO TABS: 1.0000 | ORAL_TABLET | Freq: Four times a day (QID) | ORAL | Status: DC | PRN

## 2014-08-22 NOTE — Progress Notes (Signed)
[place] [phone]   [phone]  OFFICE PROGRESS NOTE  Glo Herring., MD [place] [address]  DIAGNOSIS: Recurrent non-small cell lung cancer, adenocarcinoma with positive EGFR mutation in exon 21 ([id number]). The patient has multifocal disease including 2 nodules in the right lung with a suspicious tiny lesion in the left lung.  PRIOR THERAPY: None.  CURRENT THERAPY: Tarceva 150 mg by mouth daily status post 3 months of treatment.  INTERVAL HISTORY: [name] 74 y.o. female returns to the clinic today for follow-up visit accompanied by her daughters and son. The patient is feeling fine today with no specific complaints except for mild fatigue and shortness of breath. She is currently on home oxygen. The patient denied having any significant chest pain, cough or hemoptysis. She has no significant weight loss or night sweats. She is tolerating her treatment with oral Tarceva fairly well with no significant adverse effects. She denied having any significant skin rash or diarrhea. She has intermittent heartburn. The patient is here today for evaluation and repeat blood work as well as CT scan of the Chest, Abdomen and pelvis for restaging of her disease.Marland Kitchen  MEDICAL HISTORY: Past Medical History  Diagnosis Date  . COPD (chronic obstructive pulmonary disease)   . Thyroid disease   . Pernicious anemia   . Hypertension   . Lung cancer     ALLERGIES:  is allergic to codeine and penicillins.  MEDICATIONS:  Current Outpatient Prescriptions  Medication Sig Dispense Refill  . acetaminophen (TYLENOL) 500 MG tablet Take 500 mg by mouth every 6 (six) hours as needed.    Marland Kitchen albuterol (PROVENTIL) (2.5 MG/3ML) 0.083% nebulizer solution Take 2.5 mg by nebulization every 4 (four) hours.     . ALPRAZolam (XANAX) 1 MG tablet Take 1 mg by mouth 4 (four) times daily as needed. For anxiety and/or sleep    . clindamycin (CLEOCIN T) 1 %  external solution Apply topically 2 (two) times daily. 30 mL 1  . ferrous sulfate 325 (65 FE) MG tablet Take 325 mg by mouth 3 (three) times daily with meals.    . Fluticasone-Salmeterol (ADVAIR) 250-50 MCG/DOSE AEPB Inhale 1 puff into the lungs every 12 (twelve) hours.    Marland Kitchen guaiFENesin (MUCINEX) 600 MG 12 hr tablet Take 600 mg by mouth daily as needed for cough.     . levothyroxine (SYNTHROID, LEVOTHROID) 100 MCG tablet Take 100 mcg by mouth daily before breakfast.     . loperamide (IMODIUM) 2 MG capsule Take 2 mg by mouth as needed for diarrhea or loose stools.    Marland Kitchen loratadine (CLARITIN) 10 MG tablet Take 10 mg by mouth every morning.    . NON FORMULARY OXYGEN      24/7    . NON FORMULARY Co Q 10   One tablet daily    . NON FORMULARY Magnesium 250 mg    One tablet daily    . potassium chloride SA (K-DUR,KLOR-CON) 20 MEQ tablet Take 20 mEq by mouth daily.    Marland Kitchen TARCEVA 150 MG tablet TAKE 1 TABLET BY MOUTH DAILY. TAKE ON AN EMPTY STOMACH 1 HOUR BEFORE MEALS OR 2 HOURS AFTER. 30 tablet 2  . torsemide (DEMADEX) 20 MG tablet Take 10 mg by mouth daily.     . vitamin B-12 (CYANOCOBALAMIN) 1000 MCG tablet Take 1,000 mcg by mouth daily.    . Vitamin D, Ergocalciferol, (DRISDOL) 50000 UNITS CAPS capsule Take 50,000 Units by  mouth every 7 (seven) days. Fridays     No current facility-administered medications for this visit.    SURGICAL HISTORY:  Past Surgical History  Procedure Laterality Date  . Abdominal hysterectomy    . Appendectomy    . Cholecystectomy    . Tubal ligation    . Chest tube insertion  03/08/2012    Procedure: CHEST TUBE INSERTION;  Surgeon: Ivin Poot, MD;  Location: [facility];  Service: Thoracic;  Laterality: Right;  . Video assisted thoracoscopy  03/17/2012    Procedure: VIDEO ASSISTED THORACOSCOPY;  Surgeon: Ivin Poot, MD;  Location: [facility];  Service: Thoracic;  Laterality: Right;  . Resection of apical bleb  03/17/2012    Procedure: RESECTION OF APICAL BLEB;  Surgeon:  Ivin Poot, MD;  Location: [facility];  Service: Thoracic;  Laterality: Right;  stapling of bleb  . Colonoscopy N/A 04/29/2014    Procedure: COLONOSCOPY;  Surgeon: Daneil Dolin, MD;  [facility];  Service: Endoscopy;  Laterality: N/A;  1245pm  . Esophagogastroduodenoscopy N/A 04/29/2014    Procedure: ESOPHAGOGASTRODUODENOSCOPY (EGD);  Surgeon: Daneil Dolin, MD;  [facility];  Service: Endoscopy;  Laterality: N/A;  . Savory dilation N/A 04/29/2014    Procedure: SAVORY DILATION;  Surgeon: Daneil Dolin, MD;  [facility];  Service: Endoscopy;  Laterality: N/A;  Venia Minks dilation N/A 04/29/2014    Procedure: Venia Minks DILATION;  Surgeon: Daneil Dolin, MD;  [facility];  Service: Endoscopy;  Laterality: N/A;    REVIEW OF SYSTEMS:  Constitutional: negative Eyes: negative Ears, nose, mouth, throat, and face: negative Respiratory: positive for dyspnea on exertion Cardiovascular: negative Gastrointestinal: negative Genitourinary:negative Integument/breast: negative Hematologic/lymphatic: negative Musculoskeletal:negative Neurological: negative Behavioral/Psych: negative Endocrine: negative Allergic/Immunologic: negative   PHYSICAL EXAMINATION: General appearance: alert, cooperative, fatigued and no distress Head: Normocephalic, without obvious abnormality, atraumatic Neck: no adenopathy, no JVD, supple, symmetrical, trachea midline and thyroid not enlarged, symmetric, no tenderness/mass/nodules Lymph nodes: Cervical, supraclavicular, and axillary nodes normal. Resp: clear to auscultation bilaterally Back: symmetric, no curvature. ROM normal. No CVA tenderness. Cardio: regular rate and rhythm, S1, S2 normal, no murmur, click, rub or gallop GI: soft, non-tender; bowel sounds normal; no masses,  no organomegaly Extremities: extremities normal, atraumatic, no cyanosis or edema Neurologic: Alert and oriented X 3, normal strength and tone. Normal  symmetric reflexes. Normal coordination and gait  ECOG PERFORMANCE STATUS: 1 - Symptomatic but completely ambulatory  Blood pressure 174/71, pulse 89, temperature 98 F (36.7 C), temperature source Oral, resp. rate 22, height 5' 4"  (1.626 m), weight 124 lb 8 oz (56.473 kg), SpO2 97 %.  LABORATORY DATA: Lab Results  Component Value Date   WBC 5.0 08/15/2014   HGB 9.8* 08/15/2014   HCT 29.9* 08/15/2014   MCV 84.7 08/15/2014   PLT 238 08/15/2014      Chemistry      Component Value Date/Time   NA 144 08/15/2014 0847   NA 131* 03/25/2012 1137   K 3.5 08/15/2014 0847   K 3.8 03/25/2012 1137   CL 91* 03/25/2012 1137   CO2 27 08/15/2014 0847   CO2 31 03/25/2012 1137   BUN 13.4 08/15/2014 0847   BUN 23 03/14/2013 1212   CREATININE 0.9 08/15/2014 0847   CREATININE 1.13* 03/14/2013 1210   CREATININE 0.91 03/25/2012 1137      Component Value Date/Time   CALCIUM 8.2* 08/15/2014 0847   CALCIUM 8.4 03/25/2012 1137   ALKPHOS 128 08/15/2014  0847   ALKPHOS 86 03/25/2012 1137   AST 15 08/15/2014 0847   AST 28 03/25/2012 1137   ALT 8 08/15/2014 0847   ALT 17 03/25/2012 1137   BILITOT 0.57 08/15/2014 0847   BILITOT 0.4 03/25/2012 1137       RADIOGRAPHIC STUDIES: Ct Chest W Contrast  08/20/2014   CLINICAL DATA:  Right lung cancer diagnosed in 2013. Cough. Right leg swelling. Partial right lung resection. Ongoing chemotherapy. COPD; on home oxygen. Ex-smoker.  EXAM: CT CHEST, ABDOMEN, AND PELVIS WITH CONTRAST  TECHNIQUE: Multidetector CT imaging of the chest, abdomen and pelvis was performed following the standard protocol during bolus administration of intravenous contrast.  CONTRAST:  149m OMNIPAQUE IOHEXOL 300 MG/ML  SOLN  COMPARISON:  PET of 05/29/2014.  Most recent chest CT of 03/27/2014.  FINDINGS: CT CHEST FINDINGS  Mediastinum/Nodes: No supraclavicular adenopathy.  Aortic and branch vessel atherosclerosis. Tortuous descending thoracic aorta. Normal heart size. Multivessel coronary  artery atherosclerosis. No central pulmonary embolism, on this non-dedicated study. Pulmonary artery enlargement, outflow tract 3.6 cm.  Precarinal node measures 9 mm on image 25 versus 7 mm on the prior PET. No hilar adenopathy.  Lungs/Pleura: No pleural fluid.  Moderate to marked centrilobular emphysema.  3 mm subpleural left lower lobe pulmonary nodule on image 27 is similar.  Surgical changes about the posterior right upper lobe. Spiculated nodule along the surgical site is decreased. 2.5 x 2.4 cm today versus 3.1 x 2.2 cm on the prior.  A more medial pleural-based nodule measures 1.6 cm on image 13 versus 1.8 cm on the prior.  Ground-glass opacity more inferiorly and lateral on the right upper lobe is similar. The 11 mm right lower lobe pulmonary nodule is resolved with ill-defined ground-glass opacity remaining in this area. Example image 25.  Musculoskeletal: No acute osseous abnormality. Accentuation of expected thoracic kyphosis.  CT ABDOMEN AND PELVIS FINDINGS  Hepatobiliary: Mild hepatomegaly, 18 cm craniocaudal. No focal liver lesion. Cholecystectomy, without biliary ductal dilatation.  Pancreas: Chronic calcific pancreatitis, with atrophy and parenchymal calcifications. Improved peripancreatic inflammation since the prior PET. An area of side branch duct ectasia versus pseudocyst within the uncinate process measures 2.4 x 1.9 cm and is grossly similar to on the prior PET. Edema is again identified surrounding the superior mesenteric artery, including on image 59.  Spleen: Normal  Adrenals/Urinary Tract: Normal adrenal glands. Mild renal cortical thinning bilaterally. Too small to characterize lesions in both kidneys. No hydronephrosis. Bladder decompressed.  Stomach/Bowel: Normal stomach, without wall thickening. Normal colon and terminal ileum. Normal small bowel.  Vascular/Lymphatic: Infrarenal abdominal aortic ectasia 3.7 x 3.7 cm, similar. No surrounding hemorrhage. Left common iliac artery marked  stenosis to occlusion. No abdominopelvic adenopathy.  Reproductive: Hysterectomy.  No adnexal mass.  Other: No significant free fluid. Mild pelvic floor laxity. No ascites. No evidence of omental or peritoneal disease.  Musculoskeletal: Osteopenia. S-shaped thoracolumbar spine curvature.  IMPRESSION: 1. Posterior right upper lobe pulmonary nodule is decreased in size. Smaller pleural-based medial nodules also decreased. A more lateral right upper lobe nodule has resolved. 2. Slight enlargement of a precarinal node which is not pathologic by size criteria. This warrants followup attention. 3. No evidence of extra thoracic metastatic disease. 4. Chronic calcific pancreatitis. Grossly similar probable pseudocyst in the uncinate process. Improved surrounding inflammation. 5. 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 6. Pulmonary artery enlargement suggests pulmonary arterial hypertension.   Electronically Signed   By: KMarylyn Ishihara  Jobe Igo M.D.   On: 08/20/2014 11:53   Ct Abdomen Pelvis W Contrast  08/20/2014   CLINICAL DATA:  Right lung cancer diagnosed in 2013. Cough. Right leg swelling. Partial right lung resection. Ongoing chemotherapy. COPD; on home oxygen. Ex-smoker.  EXAM: CT CHEST, ABDOMEN, AND PELVIS WITH CONTRAST  TECHNIQUE: Multidetector CT imaging of the chest, abdomen and pelvis was performed following the standard protocol during bolus administration of intravenous contrast.  CONTRAST:  183m OMNIPAQUE IOHEXOL 300 MG/ML  SOLN  COMPARISON:  PET of 05/29/2014.  Most recent chest CT of 03/27/2014.  FINDINGS: CT CHEST FINDINGS  Mediastinum/Nodes: No supraclavicular adenopathy.  Aortic and branch vessel atherosclerosis. Tortuous descending thoracic aorta. Normal heart size. Multivessel coronary artery atherosclerosis. No central pulmonary embolism, on this non-dedicated study. Pulmonary artery enlargement, outflow tract 3.6 cm.  Precarinal node  measures 9 mm on image 25 versus 7 mm on the prior PET. No hilar adenopathy.  Lungs/Pleura: No pleural fluid.  Moderate to marked centrilobular emphysema.  3 mm subpleural left lower lobe pulmonary nodule on image 27 is similar.  Surgical changes about the posterior right upper lobe. Spiculated nodule along the surgical site is decreased. 2.5 x 2.4 cm today versus 3.1 x 2.2 cm on the prior.  A more medial pleural-based nodule measures 1.6 cm on image 13 versus 1.8 cm on the prior.  Ground-glass opacity more inferiorly and lateral on the right upper lobe is similar. The 11 mm right lower lobe pulmonary nodule is resolved with ill-defined ground-glass opacity remaining in this area. Example image 25.  Musculoskeletal: No acute osseous abnormality. Accentuation of expected thoracic kyphosis.  CT ABDOMEN AND PELVIS FINDINGS  Hepatobiliary: Mild hepatomegaly, 18 cm craniocaudal. No focal liver lesion. Cholecystectomy, without biliary ductal dilatation.  Pancreas: Chronic calcific pancreatitis, with atrophy and parenchymal calcifications. Improved peripancreatic inflammation since the prior PET. An area of side branch duct ectasia versus pseudocyst within the uncinate process measures 2.4 x 1.9 cm and is grossly similar to on the prior PET. Edema is again identified surrounding the superior mesenteric artery, including on image 59.  Spleen: Normal  Adrenals/Urinary Tract: Normal adrenal glands. Mild renal cortical thinning bilaterally. Too small to characterize lesions in both kidneys. No hydronephrosis. Bladder decompressed.  Stomach/Bowel: Normal stomach, without wall thickening. Normal colon and terminal ileum. Normal small bowel.  Vascular/Lymphatic: Infrarenal abdominal aortic ectasia 3.7 x 3.7 cm, similar. No surrounding hemorrhage. Left common iliac artery marked stenosis to occlusion. No abdominopelvic adenopathy.  Reproductive: Hysterectomy.  No adnexal mass.  Other: No significant free fluid. Mild pelvic floor  laxity. No ascites. No evidence of omental or peritoneal disease.  Musculoskeletal: Osteopenia. S-shaped thoracolumbar spine curvature.  IMPRESSION: 1. Posterior right upper lobe pulmonary nodule is decreased in size. Smaller pleural-based medial nodules also decreased. A more lateral right upper lobe nodule has resolved. 2. Slight enlargement of a precarinal node which is not pathologic by size criteria. This warrants followup attention. 3. No evidence of extra thoracic metastatic disease. 4. Chronic calcific pancreatitis. Grossly similar probable pseudocyst in the uncinate process. Improved surrounding inflammation. 5. 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 6. Pulmonary artery enlargement suggests pulmonary arterial hypertension.   Electronically Signed   By: KAbigail MiyamotoM.D.   On: 08/20/2014 11:53    ASSESSMENT AND PLAN: This is a very pleasant 74years old with metastatic non-small cell lung cancer, adenocarcinoma with positive EGFR mutation who is currently undergoing treatment with Tarceva 150 mg  by mouth daily status post 3 months of treatment. The patient is tolerating her treatment fairly well with no significant adverse effects. Her recent CT scan of the chest, abdomen and pelvis showed mild improvement in her disease. I discussed the scan results with the patient and her family. I recommended for her to continue her treatment with the same dose. For the heartburn, The patient will continue to use Zantac or Pepcid but at least 2 hours after her dose of Tarceva. She was advised to call immediately if she has any concerning symptoms in the interval. The patient voices understanding of current disease status and treatment options and is in agreement with the current care plan.  All questions were answered. The patient knows to call the clinic with any problems, questions or concerns. We can certainly see the patient much  sooner if necessary.  Disclaimer: This note was dictated with voice recognition software. Similar sounding words can inadvertently be transcribed and may not be corrected upon review.

## 2014-08-22 NOTE — Telephone Encounter (Signed)
Gave an printed appt shced and avs for pt for June

## 2014-09-23 ENCOUNTER — Encounter: Payer: Self-pay | Admitting: Internal Medicine

## 2014-09-23 ENCOUNTER — Telehealth: Payer: Self-pay | Admitting: Internal Medicine

## 2014-09-23 ENCOUNTER — Encounter: Payer: Self-pay | Admitting: *Deleted

## 2014-09-23 ENCOUNTER — Ambulatory Visit (HOSPITAL_BASED_OUTPATIENT_CLINIC_OR_DEPARTMENT_OTHER): Payer: PPO | Admitting: Internal Medicine

## 2014-09-23 ENCOUNTER — Other Ambulatory Visit (HOSPITAL_BASED_OUTPATIENT_CLINIC_OR_DEPARTMENT_OTHER): Payer: PPO

## 2014-09-23 VITALS — BP 166/63 | HR 81 | Temp 98.2°F | Resp 18 | Ht 64.0 in | Wt 122.9 lb

## 2014-09-23 DIAGNOSIS — C3411 Malignant neoplasm of upper lobe, right bronchus or lung: Secondary | ICD-10-CM | POA: Diagnosis not present

## 2014-09-23 DIAGNOSIS — R12 Heartburn: Secondary | ICD-10-CM | POA: Diagnosis not present

## 2014-09-23 DIAGNOSIS — L03115 Cellulitis of right lower limb: Secondary | ICD-10-CM | POA: Diagnosis not present

## 2014-09-23 DIAGNOSIS — C3410 Malignant neoplasm of upper lobe, unspecified bronchus or lung: Secondary | ICD-10-CM

## 2014-09-23 DIAGNOSIS — Z5111 Encounter for antineoplastic chemotherapy: Secondary | ICD-10-CM

## 2014-09-23 LAB — CBC WITH DIFFERENTIAL/PLATELET
BASO%: 0.7 % (ref 0.0–2.0)
Basophils Absolute: 0 10*3/uL (ref 0.0–0.1)
EOS ABS: 0.2 10*3/uL (ref 0.0–0.5)
EOS%: 5.3 % (ref 0.0–7.0)
HCT: 30.3 % — ABNORMAL LOW (ref 34.8–46.6)
HGB: 9.6 g/dL — ABNORMAL LOW (ref 11.6–15.9)
LYMPH%: 29.2 % (ref 14.0–49.7)
MCH: 28 pg (ref 25.1–34.0)
MCHC: 31.7 g/dL (ref 31.5–36.0)
MCV: 88.3 fL (ref 79.5–101.0)
MONO#: 0.5 10*3/uL (ref 0.1–0.9)
MONO%: 11.8 % (ref 0.0–14.0)
NEUT#: 2.3 10*3/uL (ref 1.5–6.5)
NEUT%: 53 % (ref 38.4–76.8)
PLATELETS: 223 10*3/uL (ref 145–400)
RBC: 3.43 10*6/uL — AB (ref 3.70–5.45)
RDW: 14.2 % (ref 11.2–14.5)
WBC: 4.3 10*3/uL (ref 3.9–10.3)
lymph#: 1.3 10*3/uL (ref 0.9–3.3)

## 2014-09-23 LAB — COMPREHENSIVE METABOLIC PANEL (CC13)
ALBUMIN: 3.4 g/dL — AB (ref 3.5–5.0)
ALK PHOS: 127 U/L (ref 40–150)
ALT: 8 U/L (ref 0–55)
AST: 21 U/L (ref 5–34)
Anion Gap: 9 mEq/L (ref 3–11)
BILIRUBIN TOTAL: 0.71 mg/dL (ref 0.20–1.20)
BUN: 18.6 mg/dL (ref 7.0–26.0)
CO2: 30 mEq/L — ABNORMAL HIGH (ref 22–29)
Calcium: 8 mg/dL — ABNORMAL LOW (ref 8.4–10.4)
Chloride: 103 mEq/L (ref 98–109)
Creatinine: 1 mg/dL (ref 0.6–1.1)
EGFR: 55 mL/min/{1.73_m2} — AB (ref >90)
Glucose: 98 mg/dl (ref 70–140)
POTASSIUM: 2.8 meq/L — AB (ref 3.5–5.1)
SODIUM: 142 meq/L (ref 136–145)
Total Protein: 5.8 g/dL — ABNORMAL LOW (ref 6.4–8.3)

## 2014-09-23 MED ORDER — CEPHALEXIN 500 MG PO CAPS: 500.0000 mg | ORAL_CAPSULE | Freq: Three times a day (TID) | ORAL | Status: DC

## 2014-09-23 MED ORDER — POTASSIUM CHLORIDE CRYS ER 20 MEQ PO TBCR: 20.0000 meq | EXTENDED_RELEASE_TABLET | Freq: Two times a day (BID) | ORAL | Status: DC

## 2014-09-23 NOTE — Telephone Encounter (Signed)
Gave and printed appt sched and avs for pt for July  °

## 2014-09-23 NOTE — Progress Notes (Unsigned)
Oncology Nurse Navigator Documentation  Oncology Nurse Navigator Flowsheets 09/23/2014  Navigator Encounter Type 3 month  Patient Visit Type Follow-up/Office Visit  Treatment Phase Treatment/Oral biologic  Barriers/Navigation Needs Education/Paitient had general concerns about side effects of Tarceva, Dr. Julien Nordmann addressed.  She also has some cellulitis in right leg.  Dr. Julien Nordmann prescribed antibiotic.  I instructed her to elevate as much as possible. It was great seeing her and her daughter.    Time Spent with Patient 30

## 2014-09-23 NOTE — Progress Notes (Signed)
[place] [phone]   [phone]  OFFICE PROGRESS NOTE  Glo Herring., MD [place] [address]  DIAGNOSIS: Recurrent non-small cell lung cancer, adenocarcinoma with positive EGFR mutation in exon 21 (L858R). The patient has multifocal disease including 2 nodules in the right lung with a suspicious tiny lesion in the left lung.  PRIOR THERAPY: None.  CURRENT THERAPY: Tarceva 150 mg by mouth daily status post 4 months of treatment.  INTERVAL HISTORY: [name] 74 y.o. female returns to the clinic today for follow-up visit accompanied by her daughter. The patient is feeling fine today with no specific complaints except for mild fatigue and shortness of breath. She is currently on home oxygen. The patient denied having any significant chest pain, cough or hemoptysis. She has no significant weight loss or night sweats. She is tolerating her treatment with oral Tarceva fairly well with no significant adverse effects. She denied having any significant or diarrhea but only mild skin rash and dry skin. She has intermittent heartburn. She also has erythema and swelling of the right lower extremity.  MEDICAL HISTORY: Past Medical History  Diagnosis Date  . COPD (chronic obstructive pulmonary disease)   . Thyroid disease   . Pernicious anemia   . Hypertension   . Lung cancer     ALLERGIES:  is allergic to codeine and penicillins.  MEDICATIONS:  Current Outpatient Prescriptions  Medication Sig Dispense Refill  . acetaminophen (TYLENOL) 500 MG tablet Take 500 mg by mouth every 6 (six) hours as needed.    Marland Kitchen albuterol (PROVENTIL) (2.5 MG/3ML) 0.083% nebulizer solution Take 2.5 mg by nebulization every 4 (four) hours.     . ALPRAZolam (XANAX) 1 MG tablet Take 1 mg by mouth 4 (four) times daily as needed. For anxiety and/or sleep    . clindamycin (CLEOCIN T) 1 % external solution Apply topically 2 (two) times daily. 30 mL 1  .  ferrous sulfate 325 (65 FE) MG tablet Take 325 mg by mouth 3 (three) times daily with meals.    . Fluticasone-Salmeterol (ADVAIR) 250-50 MCG/DOSE AEPB Inhale 1 puff into the lungs every 12 (twelve) hours.    Marland Kitchen guaiFENesin (MUCINEX) 600 MG 12 hr tablet Take 600 mg by mouth daily as needed for cough.     . levothyroxine (SYNTHROID, LEVOTHROID) 100 MCG tablet Take 100 mcg by mouth daily before breakfast.     . loperamide (IMODIUM) 2 MG capsule Take 2 mg by mouth as needed for diarrhea or loose stools.    Marland Kitchen loratadine (CLARITIN) 10 MG tablet Take 10 mg by mouth every morning.    . NON FORMULARY OXYGEN      24/7    . NON FORMULARY Co Q 10   One tablet daily    . NON FORMULARY Magnesium 250 mg    One tablet daily    . oxyCODONE-acetaminophen (PERCOCET/ROXICET) 5-325 MG per tablet Take 1 tablet by mouth every 6 (six) hours as needed for severe pain. 40 tablet 0  . potassium chloride SA (K-DUR,KLOR-CON) 20 MEQ tablet Take 20 mEq by mouth daily.    Marland Kitchen TARCEVA 150 MG tablet TAKE 1 TABLET BY MOUTH DAILY. TAKE ON AN EMPTY STOMACH 1 HOUR BEFORE MEALS OR 2 HOURS AFTER. 30 tablet 2  . torsemide (DEMADEX) 20 MG tablet Take 10 mg by mouth daily.     . vitamin B-12 (CYANOCOBALAMIN) 1000 MCG tablet Take 1,000 mcg by mouth daily.    Marland Kitchen  Vitamin D, Ergocalciferol, (DRISDOL) 50000 UNITS CAPS capsule Take 50,000 Units by mouth every 7 (seven) days. Fridays     No current facility-administered medications for this visit.    SURGICAL HISTORY:  Past Surgical History  Procedure Laterality Date  . Abdominal hysterectomy    . Appendectomy    . Cholecystectomy    . Tubal ligation    . Chest tube insertion  03/08/2012    Procedure: CHEST TUBE INSERTION;  Surgeon: Ivin Poot, MD;  Location: [facility] [place];  Service: Thoracic;  Laterality: Right;  . Video assisted thoracoscopy  03/17/2012    Procedure: VIDEO ASSISTED THORACOSCOPY;  Surgeon: Ivin Poot, MD;  Location: [facility];  Service: Thoracic;  Laterality: Right;  .  Resection of apical bleb  03/17/2012    Procedure: RESECTION OF APICAL BLEB;  Surgeon: Ivin Poot, MD;  Location: [facility];  Service: Thoracic;  Laterality: Right;  stapling of bleb  . Colonoscopy N/A 04/29/2014    Procedure: COLONOSCOPY;  Surgeon: Daneil Dolin, MD;  [facility];  Service: Endoscopy;  Laterality: N/A;  1245pm  . Esophagogastroduodenoscopy N/A 04/29/2014    Procedure: ESOPHAGOGASTRODUODENOSCOPY (EGD);  Surgeon: Daneil Dolin, MD;  [facility];  Service: Endoscopy;  Laterality: N/A;  . Savory dilation N/A 04/29/2014    Procedure: SAVORY DILATION;  Surgeon: Daneil Dolin, MD;  [facility];  Service: Endoscopy;  Laterality: N/A;  Venia Minks dilation N/A 04/29/2014    Procedure: Venia Minks DILATION;  Surgeon: Daneil Dolin, MD;  [facility];  Service: Endoscopy;  Laterality: N/A;    REVIEW OF SYSTEMS:  Constitutional: negative Eyes: negative Ears, nose, mouth, throat, and face: negative Respiratory: positive for dyspnea on exertion Cardiovascular: negative Gastrointestinal: negative Genitourinary:negative Integument/breast: negative Hematologic/lymphatic: negative Musculoskeletal:negative Neurological: negative Behavioral/Psych: negative Endocrine: negative Allergic/Immunologic: negative   PHYSICAL EXAMINATION: General appearance: alert, cooperative, fatigued and no distress Head: Normocephalic, without obvious abnormality, atraumatic Neck: no adenopathy, no JVD, supple, symmetrical, trachea midline and thyroid not enlarged, symmetric, no tenderness/mass/nodules Lymph nodes: Cervical, supraclavicular, and axillary nodes normal. Resp: clear to auscultation bilaterally Back: symmetric, no curvature. ROM normal. No CVA tenderness. Cardio: regular rate and rhythm, S1, S2 normal, no murmur, click, rub or gallop GI: soft, non-tender; bowel sounds normal; no masses,  no organomegaly Extremities: extremities normal, atraumatic, no  cyanosis or edema Neurologic: Alert and oriented X 3, normal strength and tone. Normal symmetric reflexes. Normal coordination and gait  ECOG PERFORMANCE STATUS: 1 - Symptomatic but completely ambulatory  Blood pressure 166/63, pulse 81, temperature 98.2 F (36.8 C), temperature source Oral, resp. rate 18, height _0  (1.626 m), weight 122 lb 14.4 oz (55.747 kg), SpO2 99 %, peak flow 3 L/min.  LABORATORY DATA: Lab Results  Component Value Date   WBC 4.3 09/23/2014   HGB 9.6* 09/23/2014   HCT 30.3* 09/23/2014   MCV 88.3 09/23/2014   PLT 223 09/23/2014      Chemistry      Component Value Date/Time   NA 144 08/15/2014 0847   NA 131* 03/25/2012 1137   K 3.5 08/15/2014 0847   K 3.8 03/25/2012 1137   CL 91* 03/25/2012 1137   CO2 27 08/15/2014 0847   CO2 31 03/25/2012 1137   BUN 13.4 08/15/2014 0847   BUN 23 03/14/2013 1212   CREATININE 0.9 08/15/2014 0847   CREATININE 1.13* 03/14/2013 1210   CREATININE 0.91 03/25/2012 1137      Component Value Date/Time  CALCIUM 8.2* 08/15/2014 0847   CALCIUM 8.4 03/25/2012 1137   ALKPHOS 128 08/15/2014 0847   ALKPHOS 86 03/25/2012 1137   AST 15 08/15/2014 0847   AST 28 03/25/2012 1137   ALT 8 08/15/2014 0847   ALT 17 03/25/2012 1137   BILITOT 0.57 08/15/2014 0847   BILITOT 0.4 03/25/2012 1137       RADIOGRAPHIC STUDIES: No results found.  ASSESSMENT AND PLAN: This is a very pleasant 74 years old with metastatic non-small cell lung cancer, adenocarcinoma with positive EGFR mutation who is currently undergoing treatment with Tarceva 150 mg by mouth daily status post 4 months of treatment. The patient is tolerating her treatment fairly well with no significant adverse effects. I recommended for her to continue her treatment with the same dose. For the heartburn, The patient will continue to use Zantac or Pepcid but at least 2 hours after her dose of Tarceva. For the right lower extremity cellulitis, I started the patient on Keflex  500 mg by mouth 3 times a day for 10 days and she was advised to consult with her primary care physician and there is no improvement of her symptoms. She will come back for follow-up visit in one month for reevaluation and repeat blood work She was advised to call immediately if she has any concerning symptoms in the interval. The patient voices understanding of current disease status and treatment options and is in agreement with the current care plan.  All questions were answered. The patient knows to call the clinic with any problems, questions or concerns. We can certainly see the patient much sooner if necessary.  Disclaimer: This note was dictated with voice recognition software. Similar sounding words can inadvertently be transcribed and may not be corrected upon review.

## 2014-09-23 NOTE — Addendum Note (Signed)
Addended by: Lucile Crater on: 09/23/2014 11:09 AM   Modules accepted: Orders

## 2014-09-24 ENCOUNTER — Telehealth: Payer: Self-pay | Admitting: *Deleted

## 2014-09-24 MED ORDER — CEPHALEXIN 500 MG PO CAPS: 500.0000 mg | ORAL_CAPSULE | Freq: Three times a day (TID) | ORAL | Status: DC

## 2014-09-24 NOTE — Telephone Encounter (Signed)
KEFLEX PRESCRIPTION WAS E-SCRIBED TO [facility]. PT. WANTED IT SENT TO [facility] [place]. NOTIFIED [facility] OF THE ERROR. SENT THE PRESCRIPTION TO [facility].

## 2014-09-30 ENCOUNTER — Other Ambulatory Visit: Payer: Self-pay

## 2014-10-22 ENCOUNTER — Other Ambulatory Visit (HOSPITAL_BASED_OUTPATIENT_CLINIC_OR_DEPARTMENT_OTHER): Payer: PPO

## 2014-10-22 ENCOUNTER — Encounter: Payer: Self-pay | Admitting: Internal Medicine

## 2014-10-22 ENCOUNTER — Ambulatory Visit (HOSPITAL_BASED_OUTPATIENT_CLINIC_OR_DEPARTMENT_OTHER): Payer: PPO | Admitting: Internal Medicine

## 2014-10-22 ENCOUNTER — Telehealth: Payer: Self-pay | Admitting: Internal Medicine

## 2014-10-22 VITALS — BP 167/83 | HR 88 | Temp 98.1°F | Resp 18 | Ht 64.0 in | Wt 118.8 lb

## 2014-10-22 DIAGNOSIS — C3411 Malignant neoplasm of upper lobe, right bronchus or lung: Secondary | ICD-10-CM

## 2014-10-22 DIAGNOSIS — R12 Heartburn: Secondary | ICD-10-CM

## 2014-10-22 DIAGNOSIS — C3410 Malignant neoplasm of upper lobe, unspecified bronchus or lung: Secondary | ICD-10-CM

## 2014-10-22 LAB — COMPREHENSIVE METABOLIC PANEL (CC13)
ALT: 9 U/L (ref 0–55)
ANION GAP: 9 meq/L (ref 3–11)
AST: 15 U/L (ref 5–34)
Albumin: 3.5 g/dL (ref 3.5–5.0)
Alkaline Phosphatase: 119 U/L (ref 40–150)
BUN: 18 mg/dL (ref 7.0–26.0)
CHLORIDE: 105 meq/L (ref 98–109)
CO2: 28 mEq/L (ref 22–29)
Calcium: 8.6 mg/dL (ref 8.4–10.4)
Creatinine: 1.1 mg/dL (ref 0.6–1.1)
EGFR: 49 mL/min/{1.73_m2} — ABNORMAL LOW (ref >90)
Glucose: 111 mg/dl (ref 70–140)
Potassium: 3.6 mEq/L (ref 3.5–5.1)
SODIUM: 142 meq/L (ref 136–145)
Total Bilirubin: 0.66 mg/dL (ref 0.20–1.20)
Total Protein: 6.1 g/dL — ABNORMAL LOW (ref 6.4–8.3)

## 2014-10-22 LAB — CBC WITH DIFFERENTIAL/PLATELET
BASO%: 1 % (ref 0.0–2.0)
Basophils Absolute: 0 10*3/uL (ref 0.0–0.1)
EOS ABS: 0.3 10*3/uL (ref 0.0–0.5)
EOS%: 5.8 % (ref 0.0–7.0)
HCT: 28 % — ABNORMAL LOW (ref 34.8–46.6)
HGB: 9.1 g/dL — ABNORMAL LOW (ref 11.6–15.9)
LYMPH%: 24.5 % (ref 14.0–49.7)
MCH: 28.1 pg (ref 25.1–34.0)
MCHC: 32.5 g/dL (ref 31.5–36.0)
MCV: 86.3 fL (ref 79.5–101.0)
MONO#: 0.5 10*3/uL (ref 0.1–0.9)
MONO%: 9.6 % (ref 0.0–14.0)
NEUT%: 59.1 % (ref 38.4–76.8)
NEUTROS ABS: 2.9 10*3/uL (ref 1.5–6.5)
PLATELETS: 283 10*3/uL (ref 145–400)
RBC: 3.24 10*6/uL — AB (ref 3.70–5.45)
RDW: 14.9 % — ABNORMAL HIGH (ref 11.2–14.5)
WBC: 4.8 10*3/uL (ref 3.9–10.3)
lymph#: 1.2 10*3/uL (ref 0.9–3.3)

## 2014-10-22 NOTE — Progress Notes (Signed)
[facility] [place] [phone]   Fax:([phone]  OFFICE PROGRESS NOTE  Glo Herring., MD [place] [address]  DIAGNOSIS: Recurrent non-small cell lung cancer, adenocarcinoma with positive EGFR mutation in exon 21 (L858R). The patient has multifocal disease including 2 nodules in the right lung with a suspicious tiny lesion in the left lung.  PRIOR THERAPY: None.  CURRENT THERAPY: Tarceva 150 mg by mouth daily status post 5 months of treatment.  INTERVAL HISTORY: [name] 74 y.o. female returns to the clinic today for follow-up visit accompanied by her daughter. The patient is feeling fine today with no specific complaints except for shortness of breath. She is currently on home oxygen. The patient denied having any significant chest pain, cough or hemoptysis. She has no significant weight loss or night sweats. She is tolerating her treatment with oral Tarceva fairly well with no significant adverse effects. She denied having any significant or diarrhea but only mild skin rash and dry skin. The swelling and erythema in her lower extremities had significantly improved  MEDICAL HISTORY: Past Medical History  Diagnosis Date  . COPD (chronic obstructive pulmonary disease)   . Thyroid disease   . Pernicious anemia   . Hypertension   . Lung cancer     ALLERGIES:  is allergic to codeine and penicillins.  MEDICATIONS:  Current Outpatient Prescriptions  Medication Sig Dispense Refill  . acetaminophen (TYLENOL) 500 MG tablet Take 500 mg by mouth every 6 (six) hours as needed.    Marland Kitchen albuterol (PROVENTIL) (2.5 MG/3ML) 0.083% nebulizer solution Take 2.5 mg by nebulization every 4 (four) hours.     . ALPRAZolam (XANAX) 1 MG tablet Take 1 mg by mouth 4 (four) times daily as needed. For anxiety and/or sleep    . cephALEXin (KEFLEX) 500 MG capsule Take 1 capsule (500 mg total) by mouth 3 (three) times daily. 30 capsule 0  . clindamycin  (CLEOCIN T) 1 % external solution Apply topically 2 (two) times daily. 30 mL 1  . ferrous sulfate 325 (65 FE) MG tablet Take 325 mg by mouth 3 (three) times daily with meals.    . Fluticasone-Salmeterol (ADVAIR) 250-50 MCG/DOSE AEPB Inhale 1 puff into the lungs every 12 (twelve) hours.    Marland Kitchen guaiFENesin (MUCINEX) 600 MG 12 hr tablet Take 600 mg by mouth daily as needed for cough.     . levothyroxine (SYNTHROID, LEVOTHROID) 100 MCG tablet Take 100 mcg by mouth daily before breakfast.     . loperamide (IMODIUM) 2 MG capsule Take 2 mg by mouth as needed for diarrhea or loose stools.    Marland Kitchen loratadine (CLARITIN) 10 MG tablet Take 10 mg by mouth every morning.    . NON FORMULARY OXYGEN      24/7    . NON FORMULARY Co Q 10   One tablet daily    . NON FORMULARY Magnesium 250 mg    One tablet daily    . oxyCODONE-acetaminophen (PERCOCET/ROXICET) 5-325 MG per tablet Take 1 tablet by mouth every 6 (six) hours as needed for severe pain. 40 tablet 0  . potassium chloride SA (K-DUR,KLOR-CON) 20 MEQ tablet Take 1 tablet (20 mEq total) by mouth 2 (two) times daily. 14 tablet 0  . TARCEVA 150 MG tablet TAKE 1 TABLET BY MOUTH DAILY. TAKE ON AN EMPTY STOMACH 1 HOUR BEFORE MEALS OR 2 HOURS AFTER. 30 tablet 2  . torsemide (DEMADEX) 20 MG tablet Take 10 mg by  mouth daily.     . vitamin B-12 (CYANOCOBALAMIN) 1000 MCG tablet Take 1,000 mcg by mouth daily.    . Vitamin D, Ergocalciferol, (DRISDOL) 50000 UNITS CAPS capsule Take 50,000 Units by mouth every 7 (seven) days. Fridays     No current facility-administered medications for this visit.    SURGICAL HISTORY:  Past Surgical History  Procedure Laterality Date  . Abdominal hysterectomy    . Appendectomy    . Cholecystectomy    . Tubal ligation    . Chest tube insertion  03/08/2012    Procedure: CHEST TUBE INSERTION;  Surgeon: Ivin Poot, MD;  Location: [facility];  Service: Thoracic;  Laterality: Right;  . Video assisted thoracoscopy  03/17/2012    Procedure:  VIDEO ASSISTED THORACOSCOPY;  Surgeon: Ivin Poot, MD;  Location: [facility];  Service: Thoracic;  Laterality: Right;  . Resection of apical bleb  03/17/2012    Procedure: RESECTION OF APICAL BLEB;  Surgeon: Ivin Poot, MD;  Location: [facility];  Service: Thoracic;  Laterality: Right;  stapling of bleb  . Colonoscopy N/A 04/29/2014    Procedure: COLONOSCOPY;  Surgeon: Daneil Dolin, MD;  [facility];  Service: Endoscopy;  Laterality: N/A;  1245pm  . Esophagogastroduodenoscopy N/A 04/29/2014    Procedure: ESOPHAGOGASTRODUODENOSCOPY (EGD);  Surgeon: Daneil Dolin, MD;  [facility];  Service: Endoscopy;  Laterality: N/A;  . Savory dilation N/A 04/29/2014    Procedure: SAVORY DILATION;  Surgeon: Daneil Dolin, MD;  [facility];  Service: Endoscopy;  Laterality: N/A;  Venia Minks dilation N/A 04/29/2014    Procedure: Venia Minks DILATION;  Surgeon: Daneil Dolin, MD;  [facility];  Service: Endoscopy;  Laterality: N/A;    REVIEW OF SYSTEMS:  Constitutional: negative Eyes: negative Ears, nose, mouth, throat, and face: negative Respiratory: positive for dyspnea on exertion Cardiovascular: negative Gastrointestinal: negative Genitourinary:negative Integument/breast: negative Hematologic/lymphatic: negative Musculoskeletal:negative Neurological: negative Behavioral/Psych: negative Endocrine: negative Allergic/Immunologic: negative   PHYSICAL EXAMINATION: General appearance: alert, cooperative, fatigued and no distress Head: Normocephalic, without obvious abnormality, atraumatic Neck: no adenopathy, no JVD, supple, symmetrical, trachea midline and thyroid not enlarged, symmetric, no tenderness/mass/nodules Lymph nodes: Cervical, supraclavicular, and axillary nodes normal. Resp: clear to auscultation bilaterally Back: symmetric, no curvature. ROM normal. No CVA tenderness. Cardio: regular rate and rhythm, S1, S2 normal, no murmur, click, rub or  gallop GI: soft, non-tender; bowel sounds normal; no masses,  no organomegaly Extremities: extremities normal, atraumatic, no cyanosis or edema Neurologic: Alert and oriented X 3, normal strength and tone. Normal symmetric reflexes. Normal coordination and gait  ECOG PERFORMANCE STATUS: 1 - Symptomatic but completely ambulatory  Blood pressure 167/83, pulse 88, temperature 98.1 F (36.7 C), temperature source Oral, resp. rate 18, height 5' 4"  (1.626 m), weight 118 lb 12.8 oz (53.887 kg), SpO2 98 %.  LABORATORY DATA: Lab Results  Component Value Date   WBC 4.8 10/22/2014   HGB 9.1* 10/22/2014   HCT 28.0* 10/22/2014   MCV 86.3 10/22/2014   PLT 283 10/22/2014      Chemistry      Component Value Date/Time   NA 142 09/23/2014 0846   NA 131* 03/25/2012 1137   K 2.8* 09/23/2014 0846   K 3.8 03/25/2012 1137   CL 91* 03/25/2012 1137   CO2 30* 09/23/2014 0846   CO2 31 03/25/2012 1137   BUN 18.6 09/23/2014 0846   BUN 23 03/14/2013 1212   CREATININE 1.0 09/23/2014 0846  CREATININE 1.13* 03/14/2013 1210   CREATININE 0.91 03/25/2012 1137      Component Value Date/Time   CALCIUM 8.0* 09/23/2014 0846   CALCIUM 8.4 03/25/2012 1137   ALKPHOS 127 09/23/2014 0846   ALKPHOS 86 03/25/2012 1137   AST 21 09/23/2014 0846   AST 28 03/25/2012 1137   ALT 8 09/23/2014 0846   ALT 17 03/25/2012 1137   BILITOT 0.71 09/23/2014 0846   BILITOT 0.4 03/25/2012 1137       RADIOGRAPHIC STUDIES: No results found.  ASSESSMENT AND PLAN: This is a very pleasant 74 years old with metastatic non-small cell lung cancer, adenocarcinoma with positive EGFR mutation who is currently undergoing treatment with Tarceva 150 mg by mouth daily status post 5 months of treatment. The patient is tolerating her treatment fairly well with no significant adverse effects. I recommended for her to continue her treatment with the same dose. For the heartburn, The patient will continue to use Zantac or Pepcid but at least 2  hours after her dose of Tarceva. She will come back for follow-up visit in one month for reevaluation and repeat blood work as well as CT scan of the chest, abdomen and pelvis for restaging of her disease. She was advised to call immediately if she has any concerning symptoms in the interval. The patient voices understanding of current disease status and treatment options and is in agreement with the current care plan.  All questions were answered. The patient knows to call the clinic with any problems, questions or concerns. We can certainly see the patient much sooner if necessary.  Disclaimer: This note was dictated with voice recognition software. Similar sounding words can inadvertently be transcribed and may not be corrected upon review.

## 2014-10-22 NOTE — Telephone Encounter (Signed)
Gave and printed appt sched and avs for pt for Aug....gave barium

## 2014-11-13 ENCOUNTER — Emergency Department (HOSPITAL_COMMUNITY): Payer: PPO

## 2014-11-13 ENCOUNTER — Inpatient Hospital Stay (HOSPITAL_COMMUNITY): Payer: PPO

## 2014-11-13 ENCOUNTER — Inpatient Hospital Stay (HOSPITAL_COMMUNITY)
Admission: EM | Admit: 2014-11-13 | Discharge: 2014-11-15 | DRG: 065 | Disposition: A | Discharge status: Home / Self Care | Payer: PPO | Attending: Internal Medicine | Admitting: Internal Medicine

## 2014-11-13 ENCOUNTER — Encounter (HOSPITAL_COMMUNITY): Payer: Self-pay | Admitting: Emergency Medicine

## 2014-11-13 DIAGNOSIS — Z9981 Dependence on supplemental oxygen: Secondary | ICD-10-CM

## 2014-11-13 DIAGNOSIS — I671 Cerebral aneurysm, nonruptured: Secondary | ICD-10-CM | POA: Diagnosis present

## 2014-11-13 DIAGNOSIS — K222 Esophageal obstruction: Secondary | ICD-10-CM | POA: Diagnosis present

## 2014-11-13 DIAGNOSIS — Z87891 Personal history of nicotine dependence: Secondary | ICD-10-CM | POA: Diagnosis not present

## 2014-11-13 DIAGNOSIS — Z9049 Acquired absence of other specified parts of digestive tract: Secondary | ICD-10-CM

## 2014-11-13 DIAGNOSIS — E119 Type 2 diabetes mellitus without complications: Secondary | ICD-10-CM | POA: Diagnosis present

## 2014-11-13 DIAGNOSIS — C3410 Malignant neoplasm of upper lobe, unspecified bronchus or lung: Secondary | ICD-10-CM | POA: Diagnosis present

## 2014-11-13 DIAGNOSIS — D510 Vitamin B12 deficiency anemia due to intrinsic factor deficiency: Secondary | ICD-10-CM | POA: Diagnosis present

## 2014-11-13 DIAGNOSIS — I10 Essential (primary) hypertension: Secondary | ICD-10-CM | POA: Diagnosis present

## 2014-11-13 DIAGNOSIS — Z79899 Other long term (current) drug therapy: Secondary | ICD-10-CM | POA: Diagnosis not present

## 2014-11-13 DIAGNOSIS — Z809 Family history of malignant neoplasm, unspecified: Secondary | ICD-10-CM

## 2014-11-13 DIAGNOSIS — H5340 Unspecified visual field defects: Secondary | ICD-10-CM | POA: Diagnosis present

## 2014-11-13 DIAGNOSIS — J449 Chronic obstructive pulmonary disease, unspecified: Secondary | ICD-10-CM | POA: Diagnosis present

## 2014-11-13 DIAGNOSIS — Z538 Procedure and treatment not carried out for other reasons: Secondary | ICD-10-CM | POA: Diagnosis not present

## 2014-11-13 DIAGNOSIS — I63531 Cerebral infarction due to unspecified occlusion or stenosis of right posterior cerebral artery: Secondary | ICD-10-CM | POA: Diagnosis present

## 2014-11-13 DIAGNOSIS — J9610 Chronic respiratory failure, unspecified whether with hypoxia or hypercapnia: Secondary | ICD-10-CM | POA: Diagnosis present

## 2014-11-13 DIAGNOSIS — Z880 Allergy status to penicillin: Secondary | ICD-10-CM | POA: Diagnosis not present

## 2014-11-13 DIAGNOSIS — I639 Cerebral infarction, unspecified: Secondary | ICD-10-CM | POA: Diagnosis not present

## 2014-11-13 DIAGNOSIS — E079 Disorder of thyroid, unspecified: Secondary | ICD-10-CM | POA: Diagnosis present

## 2014-11-13 DIAGNOSIS — E876 Hypokalemia: Secondary | ICD-10-CM | POA: Diagnosis present

## 2014-11-13 DIAGNOSIS — Z885 Allergy status to narcotic agent status: Secondary | ICD-10-CM

## 2014-11-13 DIAGNOSIS — D638 Anemia in other chronic diseases classified elsewhere: Secondary | ICD-10-CM | POA: Diagnosis present

## 2014-11-13 DIAGNOSIS — C3411 Malignant neoplasm of upper lobe, right bronchus or lung: Secondary | ICD-10-CM | POA: Diagnosis present

## 2014-11-13 DIAGNOSIS — E785 Hyperlipidemia, unspecified: Secondary | ICD-10-CM | POA: Diagnosis present

## 2014-11-13 DIAGNOSIS — Z90710 Acquired absence of both cervix and uterus: Secondary | ICD-10-CM

## 2014-11-13 DIAGNOSIS — I6789 Other cerebrovascular disease: Secondary | ICD-10-CM | POA: Diagnosis not present

## 2014-11-13 DIAGNOSIS — R51 Headache: Secondary | ICD-10-CM | POA: Diagnosis present

## 2014-11-13 LAB — COMPREHENSIVE METABOLIC PANEL
ALK PHOS: 118 U/L (ref 38–126)
ALT: 11 U/L — ABNORMAL LOW (ref 14–54)
ANION GAP: 9 (ref 5–15)
AST: 17 U/L (ref 15–41)
Albumin: 3.6 g/dL (ref 3.5–5.0)
BUN: 17 mg/dL (ref 6–20)
CO2: 26 mmol/L (ref 22–32)
Calcium: 8.7 mg/dL — ABNORMAL LOW (ref 8.9–10.3)
Chloride: 105 mmol/L (ref 101–111)
Creatinine, Ser: 1.03 mg/dL — ABNORMAL HIGH (ref 0.44–1.00)
GFR, EST NON AFRICAN AMERICAN: 53 mL/min — AB (ref >60)
GLUCOSE: 134 mg/dL — AB (ref 65–99)
POTASSIUM: 3.2 mmol/L — AB (ref 3.5–5.1)
Sodium: 140 mmol/L (ref 135–145)
TOTAL PROTEIN: 6.3 g/dL — AB (ref 6.5–8.1)
Total Bilirubin: 0.7 mg/dL (ref 0.3–1.2)

## 2014-11-13 LAB — PROTIME-INR
INR: 1.1 (ref 0.00–1.49)
Prothrombin Time: 14.4 seconds (ref 11.6–15.2)

## 2014-11-13 LAB — I-STAT CHEM 8, ED
BUN: 16 mg/dL (ref 6–20)
CREATININE: 1 mg/dL (ref 0.44–1.00)
Calcium, Ion: 1.11 mmol/L — ABNORMAL LOW (ref 1.13–1.30)
Chloride: 103 mmol/L (ref 101–111)
Glucose, Bld: 130 mg/dL — ABNORMAL HIGH (ref 65–99)
HCT: 27 % — ABNORMAL LOW (ref 36.0–46.0)
Hemoglobin: 9.2 g/dL — ABNORMAL LOW (ref 12.0–15.0)
POTASSIUM: 3.1 mmol/L — AB (ref 3.5–5.1)
Sodium: 140 mmol/L (ref 135–145)
TCO2: 23 mmol/L (ref 0–100)

## 2014-11-13 LAB — CBC
HCT: 28.6 % — ABNORMAL LOW (ref 36.0–46.0)
Hemoglobin: 8.9 g/dL — ABNORMAL LOW (ref 12.0–15.0)
MCH: 27.5 pg (ref 26.0–34.0)
MCHC: 31.1 g/dL (ref 30.0–36.0)
MCV: 88.3 fL (ref 78.0–100.0)
PLATELETS: 292 10*3/uL (ref 150–400)
RBC: 3.24 MIL/uL — ABNORMAL LOW (ref 3.87–5.11)
RDW: 13.5 % (ref 11.5–15.5)
WBC: 4.7 10*3/uL (ref 4.0–10.5)

## 2014-11-13 MED ORDER — ALBUTEROL SULFATE (2.5 MG/3ML) 0.083% IN NEBU: 2.5000 mg | INHALATION_SOLUTION | RESPIRATORY_TRACT | Status: DC | PRN

## 2014-11-13 MED ORDER — ASPIRIN EC 81 MG PO TBEC
81.0000 mg | DELAYED_RELEASE_TABLET | Freq: Every day | ORAL | Status: DC
  Administered 2014-11-13 – 2014-11-15 (×3): 81 mg via ORAL
  Filled 2014-11-13 (×3): qty 1

## 2014-11-13 MED ORDER — MOMETASONE FURO-FORMOTEROL FUM 100-5 MCG/ACT IN AERO
2.0000 | INHALATION_SPRAY | Freq: Two times a day (BID) | RESPIRATORY_TRACT | Status: DC
  Administered 2014-11-14 – 2014-11-15 (×3): 2 via RESPIRATORY_TRACT
  Filled 2014-11-13: qty 8.8

## 2014-11-13 MED ORDER — LEVOTHYROXINE SODIUM 100 MCG PO TABS
100.0000 ug | ORAL_TABLET | Freq: Every day | ORAL | Status: DC
  Administered 2014-11-14 – 2014-11-15 (×2): 100 ug via ORAL
  Filled 2014-11-13 (×2): qty 1

## 2014-11-13 MED ORDER — ALBUTEROL SULFATE (2.5 MG/3ML) 0.083% IN NEBU
2.5000 mg | INHALATION_SOLUTION | Freq: Four times a day (QID) | RESPIRATORY_TRACT | Status: DC
  Administered 2014-11-13 – 2014-11-15 (×7): 2.5 mg via RESPIRATORY_TRACT
  Filled 2014-11-13 (×8): qty 3

## 2014-11-13 MED ORDER — ERLOTINIB HCL 150 MG PO TABS: 150.0000 mg | ORAL_TABLET | Freq: Every day | ORAL | Status: DC

## 2014-11-13 MED ORDER — STROKE: EARLY STAGES OF RECOVERY BOOK
Freq: Once | Status: AC
  Administered 2014-11-13: 23:00:00

## 2014-11-13 MED ORDER — VITAMIN B-12 1000 MCG SL SUBL: 1.0000 | SUBLINGUAL_TABLET | Freq: Every day | SUBLINGUAL | Status: DC

## 2014-11-13 MED ORDER — POTASSIUM CHLORIDE CRYS ER 20 MEQ PO TBCR
20.0000 meq | EXTENDED_RELEASE_TABLET | Freq: Two times a day (BID) | ORAL | Status: DC
  Administered 2014-11-13 – 2014-11-15 (×4): 20 meq via ORAL
  Filled 2014-11-13 (×4): qty 1

## 2014-11-13 MED ORDER — HEPARIN SODIUM (PORCINE) 5000 UNIT/ML IJ SOLN
5000.0000 [IU] | Freq: Three times a day (TID) | INTRAMUSCULAR | Status: DC
  Administered 2014-11-13 – 2014-11-14 (×4): 5000 [IU] via SUBCUTANEOUS
  Filled 2014-11-13 (×5): qty 1

## 2014-11-13 MED ORDER — SENNOSIDES-DOCUSATE SODIUM 8.6-50 MG PO TABS
1.0000 | ORAL_TABLET | Freq: Every evening | ORAL | Status: DC | PRN
Start: 2014-11-13 — End: 2014-11-15

## 2014-11-13 MED ORDER — ALPRAZOLAM 0.5 MG PO TABS
0.5000 mg | ORAL_TABLET | Freq: Three times a day (TID) | ORAL | Status: DC | PRN
  Administered 2014-11-14 – 2014-11-15 (×3): 0.5 mg via ORAL
  Filled 2014-11-13 (×3): qty 1

## 2014-11-13 MED ORDER — ACETAMINOPHEN 325 MG PO TABS
650.0000 mg | ORAL_TABLET | Freq: Four times a day (QID) | ORAL | Status: AC | PRN
  Administered 2014-11-13 – 2014-11-14 (×2): 650 mg via ORAL
  Filled 2014-11-13 (×2): qty 2

## 2014-11-13 MED ORDER — ERLOTINIB HCL 25 MG PO TABS
150.0000 mg | ORAL_TABLET | Freq: Every day | ORAL | Status: DC
  Filled 2014-11-13: qty 6

## 2014-11-13 MED ORDER — VITAMIN B-12 1000 MCG PO TABS
1000.0000 ug | ORAL_TABLET | Freq: Every day | ORAL | Status: DC
  Administered 2014-11-14 – 2014-11-15 (×2): 1000 ug via ORAL
  Filled 2014-11-13 (×2): qty 1

## 2014-11-13 NOTE — ED Notes (Signed)
Patient transported to MRI 

## 2014-11-13 NOTE — H&P (Signed)
Patient Demographics  [name], is a 74 y.o. female  MRN: [id number]   DOB - 05-Mar-1941  Admit Date - 11/13/2014  Outpatient Primary MD for the patient is Glo Herring., MD   With History of -  Past Medical History  Diagnosis Date  . COPD (chronic obstructive pulmonary disease)   . Thyroid disease   . Pernicious anemia   . Hypertension   . Lung cancer       Past Surgical History  Procedure Laterality Date  . Abdominal hysterectomy    . Appendectomy    . Cholecystectomy    . Tubal ligation    . Chest tube insertion  03/08/2012    Procedure: CHEST TUBE INSERTION;  Surgeon: Ivin Poot, MD;  Location: [place];  Service: Thoracic;  Laterality: Right;  . Video assisted thoracoscopy  03/17/2012    Procedure: VIDEO ASSISTED THORACOSCOPY;  Surgeon: Ivin Poot, MD;  Location: [place];  Service: Thoracic;  Laterality: Right;  . Resection of apical bleb  03/17/2012    Procedure: RESECTION OF APICAL BLEB;  Surgeon: Ivin Poot, MD;  Location: [place];  Service: Thoracic;  Laterality: Right;  stapling of bleb  . Colonoscopy N/A 04/29/2014    Procedure: COLONOSCOPY;  Surgeon: Daneil Dolin, MD;  [facility];  Service: Endoscopy;  Laterality: N/A;  1245pm  . Esophagogastroduodenoscopy N/A 04/29/2014    Procedure: ESOPHAGOGASTRODUODENOSCOPY (EGD);  Surgeon: Daneil Dolin, MD;  [facility];  Service: Endoscopy;  Laterality: N/A;  . Savory dilation N/A 04/29/2014    Procedure: SAVORY DILATION;  Surgeon: Daneil Dolin, MD;  [facility];  Service: Endoscopy;  Laterality: N/A;  Venia Minks dilation N/A 04/29/2014    Procedure: Venia Minks DILATION;  Surgeon: Daneil Dolin, MD;  [facility];  Service: Endoscopy;  Laterality: N/A;    in for   Chief Complaint  Patient presents with  . Visual Field Change  . Loss of Vision     HPI  [name]  is a 74 y.o. female, medical history of lung cancer, followed by Dr. Earlie Server, COPD, chronic  respiratory failure on 3 L nasal cannula at baseline, patient presents with complaints of loss of vision, patient reports poor vision at baseline secondary to cataract, reports woke up Monday, with blurry vision, and decreased visual field in the left, she denies any other focal deficits , no altered mental status, no slurred speech, no focal tingling numbness or weakness , she went to see her ophthalmologist today, who recommended her coming to ED evaluation for acute CVA , CT head showing acute CVA in right PCA distribution ,  agent workup was significant for mild hypokalemia, and anemia with hemoglobin  at her baseline . - Patient was requested to be transferred to neuro unit at [facility] by neurology service .    Review of Systems    In addition to the HPI above,  No Fever-chills, reports headache  Headachblurry vision, and visual lossems swallowing food or Liquids, No Chest pain, Cough or Shortness of Breath, No Abdominal pain, No Nausea or Vommitting, Bowel movements are regular, No Blood in stool or Urine, No dysuria, No new skin rashes or bruises, No new joints pains-aches,  No new weakness, tingling, numbness in any extremity, No recent weight gain or loss, No polyuria, polydypsia or polyphagia, No significant Mental Stressors.  A full 10 point Review of Systems was done, except as stated above,  all other Review of Systems were negative.   Social History Social History  Substance Use Topics  . Smoking status: Former Smoker    Types: Cigarettes    Quit date: 04/05/2006  . Smokeless tobacco: Former Systems developer    Quit date: 03/07/2006  . Alcohol Use: No     Family History Family History  Problem Relation Age of Onset  . Cancer Mother   . Heart failure Brother   . Colon cancer Neg Hx     Prior to Admission medications   Medication Sig Start Date End Date Taking? Authorizing Provider  acetaminophen (TYLENOL) 500 MG tablet Take 500-1,000 mg by mouth every 6 (six)  hours as needed for moderate pain.    Yes Historical Provider, MD  albuterol (PROVENTIL) (2.5 MG/3ML) 0.083% nebulizer solution Take 2.5 mg by nebulization every 4 (four) hours. Or Four times daily   Yes Historical Provider, MD  ALPRAZolam Duanne Moron) 1 MG tablet Take 1 mg by mouth 4 (four) times daily as needed. For anxiety and/or sleep   Yes Historical Provider, MD  clindamycin (CLEOCIN T) 1 % external solution Apply topically 2 (two) times daily. Patient taking differently: Apply 1 application topically 2 (two) times daily as needed (acne breakouts).  06/03/14  Yes Adrena E Johnson, PA-C  Cyanocobalamin (VITAMIN B-12) 1000 MCG SUBL Place 1 tablet under the tongue daily.   Yes Historical Provider, MD  ferrous sulfate 325 (65 FE) MG tablet Take 325 mg by mouth 3 (three) times daily with meals.   Yes Historical Provider, MD  Fluticasone-Salmeterol (ADVAIR) 250-50 MCG/DOSE AEPB Inhale 1 puff into the lungs every 12 (twelve) hours.   Yes Historical Provider, MD  guaiFENesin (MUCINEX) 600 MG 12 hr tablet Take 600 mg by mouth daily as needed for cough.    Yes Historical Provider, MD  levothyroxine (SYNTHROID, LEVOTHROID) 100 MCG tablet Take 100 mcg by mouth daily before breakfast.    Yes Historical Provider, MD  loperamide (IMODIUM) 2 MG capsule Take 2 mg by mouth as needed for diarrhea or loose stools.   Yes Historical Provider, MD  loratadine (CLARITIN) 10 MG tablet Take 10 mg by mouth daily as needed for allergies.    Yes Historical Provider, MD  NON FORMULARY OXYGEN      24/7   Yes Historical Provider, MD  potassium chloride SA (K-DUR,KLOR-CON) 20 MEQ tablet Take 1 tablet (20 mEq total) by mouth 2 (two) times daily. Patient taking differently: Take 20 mEq by mouth 2 (two) times daily as needed (when taking a fluid pill).  09/23/14  Yes Curt Bears, MD  TARCEVA 150 MG tablet TAKE 1 TABLET BY MOUTH DAILY. TAKE ON AN EMPTY STOMACH 1 HOUR BEFORE MEALS OR 2 HOURS AFTER. 08/15/14  Yes Curt Bears, MD    torsemide (DEMADEX) 20 MG tablet Take 10 mg by mouth daily as needed (fluid retention).    Yes Historical Provider, MD  Vitamin D, Ergocalciferol, (DRISDOL) 50000 UNITS CAPS capsule Take 50,000 Units by mouth every 30 (thirty) days. Fridays   Yes Historical Provider, MD    Allergies  Allergen Reactions  . Codeine Nausea And Vomiting    Projectile vomiting  . Penicillins Rash    Tolerates Rocephine    Physical Exam  Vitals  Blood pressure 202/89, pulse 82, temperature 98.2 F (36.8 C), temperature source Oral, resp. rate 18, SpO2 99 %.   1. General frail, Elderly femaleying in bed in NAD,    2. Normal affect and insight, Not Suicidal or Homicidal,  Awake Alert, Oriented X 3.  3. Vision lost and left visual field, Strength 5/5 all 4 extremities, Sensation intact all 4 extremities, Plantars down going.  4. Ears and Eyes appear Normal, Conjunctivae clear, PERRLA. Moist Oral Mucosa.  5. Supple Neck, No JVD, No cervical lymphadenopathy appriciated, No Carotid Bruits.  6. Symmetrical Chest wall movement, Good air movement bilaterally, CTAB.  7. RRR, No Gallops, Rubs or Murmurs, No Parasternal Heave.  8. Positive Bowel Sounds, Abdomen Soft, No tenderness, No organomegaly appriciated,No rebound -guarding or rigidity.  9.  No Cyanosis, Normal Skin Turgor, No Skin Rash or Bruise.  10. Good muscle tone,  joints appear normal , no effusions, Normal ROM.     Data Review  CBC  Recent Labs Lab 11/13/14 1744  HGB 9.2*  HCT 27.0*   ------------------------------------------------------------------------------------------------------------------  Chemistries   Recent Labs Lab 11/13/14 1744  NA 140  K 3.1*  CL 103  GLUCOSE 130*  BUN 16  CREATININE 1.00   ------------------------------------------------------------------------------------------------------------------ CrCl cannot be calculated (Unknown ideal  weight.). ------------------------------------------------------------------------------------------------------------------ No results for input(s): TSH, T4TOTAL, T3FREE, THYROIDAB in the last 72 hours.  Invalid input(s): FREET3   Coagulation profile  Recent Labs Lab 11/13/14 1736  INR 1.10   ------------------------------------------------------------------------------------------------------------------- No results for input(s): DDIMER in the last 72 hours. -------------------------------------------------------------------------------------------------------------------  Cardiac Enzymes No results for input(s): CKMB, TROPONINI, MYOGLOBIN in the last 168 hours.  Invalid input(s): CK ------------------------------------------------------------------------------------------------------------------ Invalid input(s): POCBNP   ---------------------------------------------------------------------------------------------------------------  Urinalysis    Component Value Date/Time   COLORURINE YELLOW 03/16/2012 [place] 03/16/2012 1140   LABSPEC 1.034* 03/16/2012 1140   PHURINE 7.5 03/16/2012 1140   GLUCOSEU NEGATIVE 03/16/2012 1140   HGBUR NEGATIVE 03/16/2012 1140   BILIRUBINUR NEGATIVE 03/16/2012 1140   KETONESUR 15* 03/16/2012 1140   PROTEINUR NEGATIVE 03/16/2012 1140   UROBILINOGEN 0.2 03/16/2012 1140   NITRITE NEGATIVE 03/16/2012 1140   LEUKOCYTESUR SMALL* 03/16/2012 1140    ----------------------------------------------------------------------------------------------------------------  Imaging results:   Ct Head Wo Contrast  11/13/2014   CLINICAL DATA:  Headache with left-sided visual field blindness starting on Monday. Possible stroke. History lung cancer.  EXAM: CT HEAD WITHOUT CONTRAST  TECHNIQUE: Contiguous axial images were obtained from the base of the skull through the vertex without intravenous contrast.  COMPARISON:  MRI of 05/31/2014.  Head  CT of 03/16/2012.  FINDINGS: Sinuses/Soft tissues: Clear paranasal sinuses and mastoid air cells.  Intracranial: Cerebral and cerebellar atrophy. Left cerebellar hemisphere low-density focus is chronic and likely related to remote lacunar infarct. Right caudate head remote lacunar infarct as well.  Right medial occipital lobe cortically based hypoattenuation is consistent with subacute infarct. No significant mass effect or midline shift. No hemorrhage, hydrocephalus, intra-axial, or extra-axial fluid collection.  IMPRESSION: 1. Cortically based infarct involving the right PCA distribution. Likely subacute, given absence of significant mass effect. 2. Cerebral/cerebellar atrophy with remote scattered lacunar infarcts. These results were called by telephone at the time of interpretation on 11/13/2014 at 5:29 pm to Dr. Deno Etienne , who verbally acknowledged these results.   Electronically Signed   By: Abigail Miyamoto M.D.   On: 11/13/2014 17:30    My personal review of EKG: Rhythm NSR, Rate  80 /min, QTc 530 , no Acute ST changes    Assessment & Plan  Active Problems:   Lung cancer, upper lobe   Acute right PCA stroke   COPD (chronic obstructive pulmonary disease)   Chronic respiratory failure   Acute CVA and right PCA distribution  -  Patient presents with visual loss, evidence of acute CVA in CT head . - She will be admitted to neuro floor, telemetry, will check 2-D echo, MRI/MRA head, carotid Doppler . - We'll start on aspirin , check lipid panel and hemoglobin A1c . - History of hypertension, blood pressure on the higher side, will keep on when necessary hydralazine, but will allow for permissive hypertension .   chronic respiratory failure  - Secondary to COPD, at baseline, on 3 L nasal cannula   COPD  - No active wheezing, continue with nebs, home medication occluding Advair   Hypokalemia - will replete, recheck in am.  Anemia  - MO given at baseline, most likely related to anemia of  chronic disease, giving her lung cancer    lung cancer  - Patient followed by Dr. Earlie Server, continue with Tarceva     DVT Prophylaxis Heparin   AM Labs Ordered, also please review Full Orders  Family Communication: Admission, patients condition and plan of care including tests being ordered have been discussed with the patient and daughter  who indicate understanding and agree with the plan and Code Status.  Code Status Full  Likely DC to home when stable  Condition GUARDED    Time spent in minutes : 55 minutes    ELGERGAWY, DAWOOD M.D on 11/13/2014 at 6:20 PM  Between 7am to 7pm - Pager - [phone]  After 7pm go to [email or web address] - password [email or web address]  And look for the night coverage person covering me after hours  [facility] Office  [phone]

## 2014-11-13 NOTE — Consult Note (Addendum)
Referring Physician: ED    Chief Complaint: HA, visual field defect left eye, CT brain with subacute occipital infarct.  HPI:                                                                                                                                         [name] is an 74 y.o. female with a past medical history significant for HTN, thyroid disease, pernicious anemia, COPD, and lung cancer, presents to the ED for evaluation of the above stated symptoms. She indicated that she woke up 2 days ago with a severe HA and couldn't see from either eye. The HA eventually went away but the vision loss did not improved and she went to see her ophthalmologist who discovered a left inferior visual field defect and sent the patient to the ED with concern for an acute stroke. Patient denies vertigo, double vision, difficulty swallowing, unsteadiness, slurred speech, confusion, bladder/bnowel dysfunction, muscle pain, cramps, fatigue, or language impairment.  CT/MRI brain were personally reviewed and demonstrated right medial occipital lobe consistent with acute/subacute infarct. Pertinent available labs were individually reviewed: INR 1.10, K 3.1, normal white count and platelets.  Date last known well: 11/10/14 Time last known well: 6 pm tPA Given: no, out of the window   Past Medical History  Diagnosis Date  . COPD (chronic obstructive pulmonary disease)   . Thyroid disease   . Pernicious anemia   . Hypertension   . Lung cancer     Past Surgical History  Procedure Laterality Date  . Abdominal hysterectomy    . Appendectomy    . Cholecystectomy    . Tubal ligation    . Chest tube insertion  03/08/2012    Procedure: CHEST TUBE INSERTION;  Surgeon: Ivin Poot, MD;  Location: [place];  Service: Thoracic;  Laterality: Right;  . Video assisted thoracoscopy  03/17/2012    Procedure: VIDEO ASSISTED THORACOSCOPY;  Surgeon: Ivin Poot, MD;  Location: [place];  Service: Thoracic;  Laterality:  Right;  . Resection of apical bleb  03/17/2012    Procedure: RESECTION OF APICAL BLEB;  Surgeon: Ivin Poot, MD;  Location: [place];  Service: Thoracic;  Laterality: Right;  stapling of bleb  . Colonoscopy N/A 04/29/2014    Procedure: COLONOSCOPY;  Surgeon: Daneil Dolin, MD;  [facility];  Service: Endoscopy;  Laterality: N/A;  1245pm  . Esophagogastroduodenoscopy N/A 04/29/2014    Procedure: ESOPHAGOGASTRODUODENOSCOPY (EGD);  Surgeon: Daneil Dolin, MD;  [facility];  Service: Endoscopy;  Laterality: N/A;  . Savory dilation N/A 04/29/2014    Procedure: SAVORY DILATION;  Surgeon: Daneil Dolin, MD;  [facility];  Service: Endoscopy;  Laterality: N/A;  Venia Minks dilation N/A 04/29/2014    Procedure: Venia Minks DILATION;  Surgeon: Daneil Dolin, MD;  [facility];  Service: Endoscopy;  Laterality:  N/A;    Family History  Problem Relation Age of Onset  . Cancer Mother   . Heart failure Brother   . Colon cancer Neg Hx    Social History:  reports that she quit smoking about 8 years ago. Her smoking use included Cigarettes. She quit smokeless tobacco use about 8 years ago. She reports that she does not drink alcohol or use illicit drugs. Family history: no MS, epilepsy, brain tumors, or brain aneurysm Allergies:  Allergies  Allergen Reactions  . Codeine Nausea And Vomiting    Projectile vomiting  . Penicillins Rash    Tolerates Rocephine    Medications:                                                                                                                           I have reviewed the patient's current medications.  ROS:                                                                                                                                       History obtained from chart review and the patient  General ROS: negative for - chills, fatigue, fever, night sweats, weight gain or weight loss Psychological ROS: negative for -  behavioral disorder, hallucinations, memory difficulties, mood swings or suicidal ideation Ophthalmic ROS: negative for - double vision, eye pain  ENT ROS: negative for - epistaxis, nasal discharge, oral lesions, sore throat, tinnitus or vertigo Allergy and Immunology ROS: negative for - hives or itchy/watery eyes Hematological and Lymphatic ROS: negative for - bleeding problems, bruising or swollen lymph nodes Endocrine ROS: negative for - galactorrhea, hair pattern changes, polydipsia/polyuria or temperature intolerance Respiratory ROS: negative for - cough, hemoptysis, shortness of breath or wheezing Cardiovascular ROS: negative for - chest pain, dyspnea on exertion, edema or irregular heartbeat Gastrointestinal ROS: negative for - abdominal pain, diarrhea, hematemesis, nausea/vomiting or stool incontinence Genito-Urinary ROS: negative for - dysuria, hematuria, incontinence or urinary frequency/urgency Musculoskeletal ROS: negative for - joint swelling or muscular weakness Neurological ROS: as noted in HPI Dermatological ROS: negative for rash and skin lesion changes   Physical exam: Blood pressure 202/89, pulse 82, temperature 98.2 F (36.8 C), temperature source Oral, resp. rate 18, SpO2 99 %. Constitutional: well developed, pleasant female in no apparent distress. Eyes: no jaundice or exophthalmos.  Head: normocephalic. Neck: supple, no bruits, no JVD. Cardiac:  no murmurs. Lungs: clear. Abdomen: soft, no tender, no mass. Extremities: no edema, clubbing, or cyanosis.  Skin: no rash  Neurologic Examination:                                                                                                      General: Mental Status: Alert, oriented, thought content appropriate.  Speech fluent without evidence of aphasia.  Able to follow 3 step commands without difficulty. Cranial Nerves: II: Discs flat bilaterally; Visual fields are impaired in the left lower quadrant , pupils equal,  round, reactive to light and accommodation III,IV, VI: ptosis not present, extra-ocular motions intact bilaterally V,VII: smile symmetric, facial light touch sensation normal bilaterally VIII: hearing normal bilaterally IX,X: uvula rises symmetrically XI: bilateral shoulder shrug XII: midline tongue extension without atrophy or fasciculations Motor: Right : Upper extremity   5/5    Left:     Upper extremity   5/5  Lower extremity   5/5     Lower extremity   5/5 Tone and bulk:normal tone throughout; no atrophy noted Sensory: Pinprick and light touch intact throughout, bilaterally Deep Tendon Reflexes:  Right: Upper Extremity   Left: Upper extremity   biceps (C-5 to C-6) 2/4   biceps (C-5 to C-6) 2/4 tricep (C7) 2/4    triceps (C7) 2/4 Brachioradialis (C6) 2/4  Brachioradialis (C6) 2/4  Lower Extremity Lower Extremity  quadriceps (L-2 to L-4) 2/4   quadriceps (L-2 to L-4) 2/4 Achilles (S1) 2/4   Achilles (S1) 2/4  Plantars: Right: downgoing   Left: downgoing Cerebellar: normal finger-to-nose,  normal heel-to-shin test Gait:  No ataxia      Results for orders placed or performed during the hospital encounter of 11/13/14 (from the past 48 hour(s))  Protime-INR     Status: None   Collection Time: 11/13/14  5:36 PM  Result Value Ref Range   Prothrombin Time 14.4 11.6 - 15.2 seconds   INR 1.10 0.00 - 1.49  I-Stat Chem 8, ED  (not at [facility], [facility])     Status: Abnormal   Collection Time: 11/13/14  5:44 PM  Result Value Ref Range   Sodium 140 135 - 145 mmol/L   Potassium 3.1 (L) 3.5 - 5.1 mmol/L   Chloride 103 101 - 111 mmol/L   BUN 16 6 - 20 mg/dL   Creatinine, Ser 1.00 0.44 - 1.00 mg/dL   Glucose, Bld 130 (H) 65 - 99 mg/dL   Calcium, Ion 1.11 (L) 1.13 - 1.30 mmol/L   TCO2 23 0 - 100 mmol/L   Hemoglobin 9.2 (L) 12.0 - 15.0 g/dL   HCT 27.0 (L) 36.0 - 46.0 %   Ct Head Wo Contrast  11/13/2014   CLINICAL DATA:  Headache with left-sided visual field blindness starting on Monday.  Possible stroke. History lung cancer.  EXAM: CT HEAD WITHOUT CONTRAST  TECHNIQUE: Contiguous axial images were obtained from the base of the skull through the vertex without intravenous contrast.  COMPARISON:  MRI of 05/31/2014.  Head CT of 03/16/2012.  FINDINGS: Sinuses/Soft tissues: Clear paranasal sinuses and mastoid air  cells.  Intracranial: Cerebral and cerebellar atrophy. Left cerebellar hemisphere low-density focus is chronic and likely related to remote lacunar infarct. Right caudate head remote lacunar infarct as well.  Right medial occipital lobe cortically based hypoattenuation is consistent with subacute infarct. No significant mass effect or midline shift. No hemorrhage, hydrocephalus, intra-axial, or extra-axial fluid collection.  IMPRESSION: 1. Cortically based infarct involving the right PCA distribution. Likely subacute, given absence of significant mass effect. 2. Cerebral/cerebellar atrophy with remote scattered lacunar infarcts. These results were called by telephone at the time of interpretation on 11/13/2014 at 5:29 pm to Dr. Deno Etienne , who verbally acknowledged these results.   Electronically Signed   By: Abigail Miyamoto M.D.   On: 11/13/2014 17:30    Assessment: 74 y.o. female with new onset HA and  Visual loss in the left lower quadrantt . CT brain and MRI confirmed a mesial right occipital infarct, PCA territory. Did not receive thrombolysis due to late presentation. Admit to medicine. Ordered completion of stroke work up as detailed below. Stroke team will follow up tomorrow.  Stroke Risk Factors - age, HTN  Plan: 1. HgbA1c, fasting lipid panel 2. MRI, MRA  of the brain without contrast 3. Echocardiogram 4. Carotid dopplers 5. Prophylactic therapy-aspirin 6. Risk factor modification 7. Telemetry monitoring 8. Frequent neuro checks 9. PT/OT SLP 10. NPO until passing swallowing evaluation  Dorian Pod ,MD [facility] [phone]  11/13/2014, 6:12  PM

## 2014-11-13 NOTE — ED Notes (Signed)
CBG 124 

## 2014-11-13 NOTE — ED Notes (Signed)
States she started with a headache and left sided visual field blindness in both eyes. Said the headache went away but the blindness stayed. She went to the eye doctor today who told her to come straight here. Only obvious stroke symptom observed in triage is stated blindness.

## 2014-11-13 NOTE — ED Notes (Signed)
Report given to [place] with [facility].

## 2014-11-13 NOTE — Progress Notes (Signed)
Attempted to get report from ED RN

## 2014-11-13 NOTE — ED Provider Notes (Signed)
CSN: [id number]     Arrival date & time 11/13/14  1645 History   First MD Initiated Contact with Patient 11/13/14 1704     Chief Complaint  Patient presents with  . Visual Field Change  . Loss of Vision     (Consider location/radiation/quality/duration/timing/severity/associated sxs/prior Treatment) Patient is a 74 y.o. female presenting with Acute Neurological Problem. The history is provided by the patient.  Cerebrovascular Accident This is a new problem. The current episode started more than 2 days ago. The problem occurs constantly. The problem has not changed since onset.Associated symptoms include headaches. Pertinent negatives include no chest pain and no shortness of breath. Nothing aggravates the symptoms. Nothing relieves the symptoms. She has tried nothing for the symptoms. The treatment provided no relief.   74 yo F with a chief complaint of visual field deficit. The started 2 days ago. Patient followed up with an ophthalmologist today who is concerned for intracranial pathology and was sent here. Patient states she is also had a mild right frontal headache that has come and gone for the past couple days. Patient has also had some difficulty controlling her blood pressure. Denies headaches or he denies blood thinner use. Denies weakness numbness coordination loss.  Past Medical History  Diagnosis Date  . COPD (chronic obstructive pulmonary disease)   . Thyroid disease   . Pernicious anemia   . Hypertension   . Lung cancer    Past Surgical History  Procedure Laterality Date  . Abdominal hysterectomy    . Appendectomy    . Cholecystectomy    . Tubal ligation    . Chest tube insertion  03/08/2012    Procedure: CHEST TUBE INSERTION;  Surgeon: Ivin Poot, MD;  Location: [place];  Service: Thoracic;  Laterality: Right;  . Video assisted thoracoscopy  03/17/2012    Procedure: VIDEO ASSISTED THORACOSCOPY;  Surgeon: Ivin Poot, MD;  Location: [place];  Service: Thoracic;   Laterality: Right;  . Resection of apical bleb  03/17/2012    Procedure: RESECTION OF APICAL BLEB;  Surgeon: Ivin Poot, MD;  Location: [place];  Service: Thoracic;  Laterality: Right;  stapling of bleb  . Colonoscopy N/A 04/29/2014    Procedure: COLONOSCOPY;  Surgeon: Daneil Dolin, MD;  [facility];  Service: Endoscopy;  Laterality: N/A;  1245pm  . Esophagogastroduodenoscopy N/A 04/29/2014    Procedure: ESOPHAGOGASTRODUODENOSCOPY (EGD);  Surgeon: Daneil Dolin, MD;  [facility];  Service: Endoscopy;  Laterality: N/A;  . Savory dilation N/A 04/29/2014    Procedure: SAVORY DILATION;  Surgeon: Daneil Dolin, MD;  [facility];  Service: Endoscopy;  Laterality: N/A;  Venia Minks dilation N/A 04/29/2014    Procedure: Venia Minks DILATION;  Surgeon: Daneil Dolin, MD;  [facility];  Service: Endoscopy;  Laterality: N/A;   Family History  Problem Relation Age of Onset  . Cancer Mother   . Heart failure Brother   . Colon cancer Neg Hx    Social History  Substance Use Topics  . Smoking status: Former Smoker    Types: Cigarettes    Quit date: 04/05/2006  . Smokeless tobacco: Former Systems developer    Quit date: 03/07/2006  . Alcohol Use: No   OB History    Gravida Para Term Preterm AB TAB SAB Ectopic Multiple Living   '4 4 2 2           '$ Review of Systems  Constitutional: Negative for fever and  chills.  HENT: Negative for congestion and rhinorrhea.   Eyes: Positive for visual disturbance (left lower quadrant visual field deficit). Negative for redness.  Respiratory: Negative for shortness of breath and wheezing.   Cardiovascular: Negative for chest pain and palpitations.  Gastrointestinal: Negative for nausea and vomiting.  Genitourinary: Negative for dysuria and urgency.  Musculoskeletal: Negative for myalgias and arthralgias.  Skin: Negative for pallor and wound.  Neurological: Positive for headaches. Negative for dizziness.      Allergies   Codeine and Penicillins  Home Medications   Prior to Admission medications   Medication Sig Start Date End Date Taking? Authorizing Provider  acetaminophen (TYLENOL) 500 MG tablet Take 500-1,000 mg by mouth every 6 (six) hours as needed for moderate pain.    Yes Historical Provider, MD  albuterol (PROVENTIL) (2.5 MG/3ML) 0.083% nebulizer solution Take 2.5 mg by nebulization every 4 (four) hours. Or Four times daily   Yes Historical Provider, MD  ALPRAZolam Duanne Moron) 1 MG tablet Take 1 mg by mouth 4 (four) times daily as needed. For anxiety and/or sleep   Yes Historical Provider, MD  clindamycin (CLEOCIN T) 1 % external solution Apply topically 2 (two) times daily. Patient taking differently: Apply 1 application topically 2 (two) times daily as needed (acne breakouts).  06/03/14  Yes Adrena E Johnson, PA-C  Cyanocobalamin (VITAMIN B-12) 1000 MCG SUBL Place 1 tablet under the tongue daily.   Yes Historical Provider, MD  ferrous sulfate 325 (65 FE) MG tablet Take 325 mg by mouth 3 (three) times daily with meals.   Yes Historical Provider, MD  Fluticasone-Salmeterol (ADVAIR) 250-50 MCG/DOSE AEPB Inhale 1 puff into the lungs every 12 (twelve) hours.   Yes Historical Provider, MD  guaiFENesin (MUCINEX) 600 MG 12 hr tablet Take 600 mg by mouth daily as needed for cough.    Yes Historical Provider, MD  levothyroxine (SYNTHROID, LEVOTHROID) 100 MCG tablet Take 100 mcg by mouth daily before breakfast.    Yes Historical Provider, MD  loperamide (IMODIUM) 2 MG capsule Take 2 mg by mouth as needed for diarrhea or loose stools.   Yes Historical Provider, MD  loratadine (CLARITIN) 10 MG tablet Take 10 mg by mouth daily as needed for allergies.    Yes Historical Provider, MD  NON FORMULARY OXYGEN      24/7   Yes Historical Provider, MD  potassium chloride SA (K-DUR,KLOR-CON) 20 MEQ tablet Take 1 tablet (20 mEq total) by mouth 2 (two) times daily. Patient taking differently: Take 20 mEq by mouth 2 (two) times  daily as needed (when taking a fluid pill).  09/23/14  Yes Curt Bears, MD  TARCEVA 150 MG tablet TAKE 1 TABLET BY MOUTH DAILY. TAKE ON AN EMPTY STOMACH 1 HOUR BEFORE MEALS OR 2 HOURS AFTER. 08/15/14  Yes Curt Bears, MD  torsemide (DEMADEX) 20 MG tablet Take 10 mg by mouth daily as needed (fluid retention).    Yes Historical Provider, MD  Vitamin D, Ergocalciferol, (DRISDOL) 50000 UNITS CAPS capsule Take 50,000 Units by mouth every 30 (thirty) days. Fridays   Yes Historical Provider, MD   BP 206/118 mmHg  Pulse 83  Temp(Src) 98.2 F (36.8 C) (Oral)  Resp 28  SpO2 100% Physical Exam  Constitutional: She is oriented to person, place, and time. She appears well-developed and well-nourished. No distress.  HENT:  Head: Normocephalic and atraumatic.  Eyes: EOM are normal.  Neck: Normal range of motion. Neck supple.  Cardiovascular: Normal rate and regular rhythm.  Exam reveals no  gallop and no friction rub.   No murmur heard. Pulmonary/Chest: Effort normal. She has no wheezes. She has no rales.  Abdominal: Soft. She exhibits no distension. There is no tenderness. There is no rebound.  Musculoskeletal: She exhibits no edema or tenderness.  Neurological: She is alert and oriented to person, place, and time. She has normal strength. A cranial nerve deficit is present. No sensory deficit. She displays a negative Romberg sign. Coordination and gait normal. GCS eye subscore is 4. GCS verbal subscore is 5. GCS motor subscore is 6. She displays no Babinski's sign on the right side. She displays no Babinski's sign on the left side.  Left lower quadrant visual field deficit. In bilateral eyes. Pupils dilated from ophthalmology exam. Unable to appreciate constriction  Skin: Skin is warm and dry. She is not diaphoretic.  Psychiatric: She has a normal mood and affect. Her behavior is normal.    ED Course  Procedures (including critical care time) Labs Review Labs Reviewed  COMPREHENSIVE METABOLIC  PANEL - Abnormal; Notable for the following:    Potassium 3.2 (*)    Glucose, Bld 134 (*)    Creatinine, Ser 1.03 (*)    Calcium 8.7 (*)    Total Protein 6.3 (*)    ALT 11 (*)    GFR calc non Af Amer 53 (*)    All other components within normal limits  CBC - Abnormal; Notable for the following:    RBC 3.24 (*)    Hemoglobin 8.9 (*)    HCT 28.6 (*)    All other components within normal limits  I-STAT CHEM 8, ED - Abnormal; Notable for the following:    Potassium 3.1 (*)    Glucose, Bld 130 (*)    Calcium, Ion 1.11 (*)    Hemoglobin 9.2 (*)    HCT 27.0 (*)    All other components within normal limits  PROTIME-INR  HEMOGLOBIN A1C  LIPID PANEL  CBC  BASIC METABOLIC PANEL  CBG MONITORING, ED    Imaging Review Ct Head Wo Contrast  11/13/2014   CLINICAL DATA:  Headache with left-sided visual field blindness starting on Monday. Possible stroke. History lung cancer.  EXAM: CT HEAD WITHOUT CONTRAST  TECHNIQUE: Contiguous axial images were obtained from the base of the skull through the vertex without intravenous contrast.  COMPARISON:  MRI of 05/31/2014.  Head CT of 03/16/2012.  FINDINGS: Sinuses/Soft tissues: Clear paranasal sinuses and mastoid air cells.  Intracranial: Cerebral and cerebellar atrophy. Left cerebellar hemisphere low-density focus is chronic and likely related to remote lacunar infarct. Right caudate head remote lacunar infarct as well.  Right medial occipital lobe cortically based hypoattenuation is consistent with subacute infarct. No significant mass effect or midline shift. No hemorrhage, hydrocephalus, intra-axial, or extra-axial fluid collection.  IMPRESSION: 1. Cortically based infarct involving the right PCA distribution. Likely subacute, given absence of significant mass effect. 2. Cerebral/cerebellar atrophy with remote scattered lacunar infarcts. These results were called by telephone at the time of interpretation on 11/13/2014 at 5:29 pm to Dr. Deno Etienne , who  verbally acknowledged these results.   Electronically Signed   By: Abigail Miyamoto M.D.   On: 11/13/2014 17:30     EKG Interpretation None      MDM   Final diagnoses:  Acute right PCA stroke    74 yo F with a left-sided visual field deficit. CT scan with a acute/subacute right PCA stroke.   Spoke with neurology will have his transfer to [facility]. MRI performed here confirm subacute stroke.  CRITICAL CARE Performed by: Cecilio Asper   Total critical care time: 30  Critical care time was exclusive of separately billable procedures and treating other patients.  Critical care was necessary to treat or prevent imminent or life-threatening deterioration.  Critical care was time spent personally by me on the following activities: development of treatment plan with patient and/or surrogate as well as nursing, discussions with consultants, evaluation of patient's response to treatment, examination of patient, obtaining history from patient or surrogate, ordering and performing treatments and interventions, ordering and review of laboratory studies, ordering and review of radiographic studies, pulse oximetry and re-evaluation of patient's condition.   Deno Etienne, DO 11/13/14 2227

## 2014-11-14 ENCOUNTER — Inpatient Hospital Stay (HOSPITAL_COMMUNITY): Payer: PPO

## 2014-11-14 ENCOUNTER — Other Ambulatory Visit (HOSPITAL_COMMUNITY): Payer: PPO

## 2014-11-14 DIAGNOSIS — I10 Essential (primary) hypertension: Secondary | ICD-10-CM | POA: Diagnosis present

## 2014-11-14 DIAGNOSIS — D638 Anemia in other chronic diseases classified elsewhere: Secondary | ICD-10-CM

## 2014-11-14 DIAGNOSIS — E876 Hypokalemia: Secondary | ICD-10-CM

## 2014-11-14 DIAGNOSIS — I639 Cerebral infarction, unspecified: Secondary | ICD-10-CM

## 2014-11-14 HISTORY — DX: Anemia in other chronic diseases classified elsewhere: D63.8

## 2014-11-14 LAB — LIPID PANEL
Cholesterol: 150 mg/dL (ref 0–200)
HDL: 45 mg/dL (ref >40)
LDL Cholesterol: 82 mg/dL (ref 0–99)
TRIGLYCERIDES: 114 mg/dL (ref <150)
Total CHOL/HDL Ratio: 3.3 RATIO
VLDL: 23 mg/dL (ref 0–40)

## 2014-11-14 LAB — CBG MONITORING, ED: Glucose-Capillary: 115 mg/dL — ABNORMAL HIGH (ref 65–99)

## 2014-11-14 LAB — MAGNESIUM: Magnesium: 1.7 mg/dL (ref 1.7–2.4)

## 2014-11-14 MED ORDER — ATORVASTATIN CALCIUM 10 MG PO TABS
10.0000 mg | ORAL_TABLET | Freq: Every day | ORAL | Status: DC
  Administered 2014-11-14 – 2014-11-15 (×2): 10 mg via ORAL
  Filled 2014-11-14 (×2): qty 1

## 2014-11-14 MED ORDER — CYCLOBENZAPRINE HCL 10 MG PO TABS
5.0000 mg | ORAL_TABLET | Freq: Once | ORAL | Status: DC
Start: 2014-11-14 — End: 2014-11-15
  Filled 2014-11-14: qty 1

## 2014-11-14 MED ORDER — POTASSIUM CHLORIDE CRYS ER 20 MEQ PO TBCR
40.0000 meq | EXTENDED_RELEASE_TABLET | Freq: Once | ORAL | Status: AC
  Administered 2014-11-14: 40 meq via ORAL
  Filled 2014-11-14: qty 2

## 2014-11-14 MED ORDER — ACETAMINOPHEN 325 MG PO TABS
650.0000 mg | ORAL_TABLET | Freq: Four times a day (QID) | ORAL | Status: DC | PRN
  Administered 2014-11-14 – 2014-11-15 (×3): 650 mg via ORAL
  Filled 2014-11-14 (×3): qty 2

## 2014-11-14 NOTE — Evaluation (Signed)
Speech Language Pathology Evaluation Patient Details Name: [name] MRN: [id number] DOB: Jul 17, 1940 Today's Date: 11/14/2014 Time: 3704-8889 SLP Time Calculation (min) (ACUTE ONLY): 17 min  Problem List:  Patient Active Problem List   Diagnosis Date Noted  . Acute right PCA stroke 11/13/2014  . CVA (cerebral infarction) 11/13/2014  . COPD (chronic obstructive pulmonary disease) 11/13/2014  . Chronic respiratory failure 11/13/2014  . Cellulitis of right leg 09/23/2014  . Encounter for antineoplastic chemotherapy 09/23/2014  . Mucosal abnormality of stomach   . Esophageal ring   . Special screening for malignant neoplasms, colon   . Dysphagia, pharyngoesophageal phase 04/19/2014  . Encounter for screening colonoscopy 04/19/2014  . Spontaneous pneumothorax 05/10/2012  . Lung cancer, upper lobe 05/10/2012   Past Medical History:  Past Medical History  Diagnosis Date  . COPD (chronic obstructive pulmonary disease)   . Thyroid disease   . Pernicious anemia   . Hypertension   . Lung cancer    Past Surgical History:  Past Surgical History  Procedure Laterality Date  . Abdominal hysterectomy    . Appendectomy    . Cholecystectomy    . Tubal ligation    . Chest tube insertion  03/08/2012    Procedure: CHEST TUBE INSERTION;  Surgeon: Ivin Poot, MD;  Location: [place];  Service: Thoracic;  Laterality: Right;  . Video assisted thoracoscopy  03/17/2012    Procedure: VIDEO ASSISTED THORACOSCOPY;  Surgeon: Ivin Poot, MD;  Location: [place];  Service: Thoracic;  Laterality: Right;  . Resection of apical bleb  03/17/2012    Procedure: RESECTION OF APICAL BLEB;  Surgeon: Ivin Poot, MD;  Location: [place];  Service: Thoracic;  Laterality: Right;  stapling of bleb  . Colonoscopy N/A 04/29/2014    Procedure: COLONOSCOPY;  Surgeon: Daneil Dolin, MD;  [facility];  Service: Endoscopy;  Laterality: N/A;  1245pm  . Esophagogastroduodenoscopy N/A 04/29/2014    Procedure:  ESOPHAGOGASTRODUODENOSCOPY (EGD);  Surgeon: Daneil Dolin, MD;  [facility];  Service: Endoscopy;  Laterality: N/A;  . Savory dilation N/A 04/29/2014    Procedure: SAVORY DILATION;  Surgeon: Daneil Dolin, MD;  [facility];  Service: Endoscopy;  Laterality: N/A;  Venia Minks dilation N/A 04/29/2014    Procedure: Venia Minks DILATION;  Surgeon: Daneil Dolin, MD;  [facility];  Service: Endoscopy;  Laterality: N/A;   HPI:  74 y.o. female, medical history of lung cancer, followed by Dr. Earlie Server, COPD, chronic respiratory failure on 3 L nasal cannula at baseline, patient presents with complaints of loss of vision, patient reports poor vision at baseline secondary to cataract, reports woke up Monday, with blurry vision, and decreased visual field in the left. MRI + R nonhemorragic infarct PCA distribution and punctate infarct R thalamus.    Assessment / Plan / Recommendation Clinical Impression  Cognitive-linguistic evaluation complete including administration of the [facility] version 7.1  Patient scored a 29/30, WFL for all tasks assessed. No further SLP needs indicated.     SLP Assessment  Patient does not need any further Speech Lanaguage Pathology Services    Follow Up Recommendations  None       Pertinent Vitals/Pain Pain Assessment: No/denies pain    SLP Evaluation Prior Functioning  Cognitive/Linguistic Baseline: Within functional limits Type of Home: House Available Help at Discharge: Family;Available 24 hours/day   Cognition  Overall Cognitive Status: Within Functional Limits for tasks assessed Orientation Level: Oriented X4  Comprehension  Auditory Comprehension Overall Auditory Comprehension: Appears within functional limits for tasks assessed Visual Recognition/Discrimination Discrimination: Within Function Limits Reading Comprehension Reading Status: Not tested    Expression Expression Primary Mode of Expression: Verbal Verbal  Expression Overall Verbal Expression: Appears within functional limits for tasks assessed Written Expression Dominant Hand: Right Written Expression: Not tested   Oral / Motor Oral Motor/Sensory Function Overall Oral Motor/Sensory Function: Appears within functional limits for tasks assessed Motor Speech Overall Motor Speech: Appears within functional limits for tasks assessed   GO   Gabriel Rainwater MA, CCC-SLP [phone]   Gabriel Conry Meryl 11/14/2014, 1:27 PM

## 2014-11-14 NOTE — Progress Notes (Signed)
Pt arrived to unit via [facility] from [facility].  Pt oriented to staff, unit and plan of care.  Pt vitals and assessment stable, see flowsheets.  Tele applied and central monitoring notified.  Will continue to monitor.

## 2014-11-14 NOTE — Care Management Note (Signed)
Case Management Note  Patient Details  Name: [name] MRN: [id number] Date of Birth: April 01, 1941  Subjective/Objective:                    Action/Plan: Patient admitted with CVA. Lives at home with children. Will follow for discharge needs pending PT/OT evals and physician orders.  Expected Discharge Date:                  Expected Discharge Plan:   (Lives at home with children)  In-House Referral:     Discharge planning Services     Post Acute Care Choice:    Choice offered to:     DME Arranged:    DME Agency:     HH Arranged:    HH Agency:     Status of Service:  In process, will continue to follow  Medicare Important Message Given:    Date Medicare IM Given:    Medicare IM give by:    Date Additional Medicare IM Given:    Additional Medicare Important Message give by:     If discussed at [place] of Stay Meetings, dates discussed:    Additional Comments:  Rolm Baptise, RN 11/14/2014, 11:00 AM [phone]

## 2014-11-14 NOTE — Progress Notes (Signed)
[facility] PROGRESS NOTE  [name] ZJI:[id number] DOB: 02/07/1941 DOA: 11/13/2014 PCP: Glo Herring., MD  Assessment/Plan:  Principal Problem:   Acute right PCA stroke Active Problems:   Lung cancer, upper lobe   COPD (chronic obstructive pulmonary disease)   Chronic respiratory failure   Anemia   Hypokalemia   Essential hypertension  Workup underway. For TEE tomorrow. Replete potassium.  outpt OT recommended  HPI/Subjective:  Visual disturbance unchanged  Objective: Filed Vitals:   11/14/14 1458  BP: 163/66  Pulse: 80  Temp: 98.1 F (36.7 C)  Resp: 15   No intake or output data in the 24 hours ending 11/14/14 1819 Filed Weights   11/13/14 2037  Weight: 53 kg (116 lb 13.5 oz)    Exam:   General:  A and o on oxygen  Cardiovascular: RRR without MGR  Respiratory: CTA withoutWRR  Abdomen: s, nt, nd  Ext: no CCE  Neuro: left hemianopsia. Sensorimotor intact  Basic Metabolic Panel:  Recent Labs Lab 11/13/14 1736 11/13/14 1744 11/14/14 0630  NA 140 140  --   K 3.2* 3.1*  --   CL 105 103  --   CO2 26  --   --   GLUCOSE 134* 130*  --   BUN 17 16  --   CREATININE 1.03* 1.00  --   CALCIUM 8.7*  --   --   MG  --   --  1.7   Liver Function Tests:  Recent Labs Lab 11/13/14 1736  AST 17  ALT 11*  ALKPHOS 118  BILITOT 0.7  PROT 6.3*  ALBUMIN 3.6   No results for input(s): LIPASE, AMYLASE in the last 168 hours. No results for input(s): AMMONIA in the last 168 hours. CBC:  Recent Labs Lab 11/13/14 1736 11/13/14 1744  WBC 4.7  --   HGB 8.9* 9.2*  HCT 28.6* 27.0*  MCV 88.3  --   PLT 292  --    Cardiac Enzymes: No results for input(s): CKTOTAL, CKMB, CKMBINDEX, TROPONINI in the last 168 hours. BNP (last 3 results) No results for input(s): BNP in the last 8760 hours.  ProBNP (last 3 results) No results for input(s): PROBNP in the last 8760 hours.  CBG:  Recent Labs Lab 11/13/14 1925  GLUCAP 115*    No results found  for this or any previous visit (from the past 240 hour(s)).   Studies: Ct Head Wo Contrast  11/13/2014   CLINICAL DATA:  Headache with left-sided visual field blindness starting on Monday. Possible stroke. History lung cancer.  EXAM: CT HEAD WITHOUT CONTRAST  TECHNIQUE: Contiguous axial images were obtained from the base of the skull through the vertex without intravenous contrast.  COMPARISON:  MRI of 05/31/2014.  Head CT of 03/16/2012.  FINDINGS: Sinuses/Soft tissues: Clear paranasal sinuses and mastoid air cells.  Intracranial: Cerebral and cerebellar atrophy. Left cerebellar hemisphere low-density focus is chronic and likely related to remote lacunar infarct. Right caudate head remote lacunar infarct as well.  Right medial occipital lobe cortically based hypoattenuation is consistent with subacute infarct. No significant mass effect or midline shift. No hemorrhage, hydrocephalus, intra-axial, or extra-axial fluid collection.  IMPRESSION: 1. Cortically based infarct involving the right PCA distribution. Likely subacute, given absence of significant mass effect. 2. Cerebral/cerebellar atrophy with remote scattered lacunar infarcts. These results were called by telephone at the time of interpretation on 11/13/2014 at 5:29 pm to Dr. Deno Etienne , who verbally acknowledged these results.   Electronically Signed   By:  Abigail Miyamoto M.D.   On: 11/13/2014 17:30   Mr Jodene Nam Head Wo Contrast  11/13/2014   CLINICAL DATA:  Left-sided visual field defect.  Acute infarct.  EXAM: MRI HEAD WITHOUT CONTRAST  MRA HEAD WITHOUT CONTRAST  TECHNIQUE: Multiplanar, multiecho pulse sequences of the brain and surrounding structures were obtained without intravenous contrast. Angiographic images of the head were obtained using MRA technique without contrast.  COMPARISON:  CT head without contrast from the same day. MRI brain 05/31/2014.  FINDINGS: MRI HEAD FINDINGS  The diffusion-weighted images confirm an acute nonhemorrhagic right  occipital lobe infarct. There is also a punctate infarct in the medial right thalamus. Extensive T2 changes are associated with the areas of acute infarct. Remote lacunar infarcts are present in the right caudate head and bilateral thalami. There is remote lacunar infarct in the right lentiform nucleus. Mild periventricular and subcortical T2 changes bilaterally likely reflect the sequela of chronic microvascular ischemia.  No acute hemorrhage or mass lesion is present. The ventricles are proportionate to the degree of atrophy. No significant extra-axial fluid collection is present.  Flow present in the major intracranial arteries. The globes orbits are intact. The paranasal sinuses and mastoid air cells are clear.  Skullbase is within normal limits. Midline structures are unremarkable.  MRA HEAD FINDINGS  Mild atherosclerotic changes are noted in the ophthalmic segment of the internal carotid arteries bilaterally. A small infundibulum is noted at posterior communicating artery bilaterally. Moderate proximal stenosis is noted at the right A1 segment. The left A1 segment is normal. 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 The MCA bifurcations are intact. There is mild attenuation of distal MCA branch vessels.  The left vertebral artery is slightly dominant to the right. The PICA origins are visualized and normal. The basilar artery is within normal limits. Both posterior cerebral arteries originate from the basilar tip. Small posterior communicating arteries are present. The PCA branch vessels demonstrate flow bilaterally.  IMPRESSION: 1. Confirmation of large nonhemorrhagic right PCA territory infarct 2. Punctate nonhemorrhagic infarct in the medial right thalamus. 3. Additional remote lacunar infarcts are present in the basal ganglia bilaterally. 4. Age advanced atrophy and white matter disease. This likely reflects the sequela of chronic  microvascular ischemia. 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 6. Moderate stenosis of the proximal right A1 segment. These results were called by telephone at the time of interpretation on 11/13/2014 at 7:51 pm to Dr. Deno Etienne, who verbally acknowledged these results.   Electronically Signed   By: San Morelle M.D.   On: 11/13/2014 19:51   Mr Brain Wo Contrast  11/13/2014   CLINICAL DATA:  Left-sided visual field defect.  Acute infarct.  EXAM: MRI HEAD WITHOUT CONTRAST  MRA HEAD WITHOUT CONTRAST  TECHNIQUE: Multiplanar, multiecho pulse sequences of the brain and surrounding structures were obtained without intravenous contrast. Angiographic images of the head were obtained using MRA technique without contrast.  COMPARISON:  CT head without contrast from the same day. MRI brain 05/31/2014.  FINDINGS: MRI HEAD FINDINGS  The diffusion-weighted images confirm an acute nonhemorrhagic right occipital lobe infarct. There is also a punctate infarct in the medial right thalamus. Extensive T2 changes are associated with the areas of acute infarct. Remote lacunar infarcts are present in the right caudate head and bilateral thalami. There is remote lacunar infarct in the right lentiform nucleus. Mild periventricular and subcortical T2 changes bilaterally likely reflect the sequela of  chronic microvascular ischemia.  No acute hemorrhage or mass lesion is present. The ventricles are proportionate to the degree of atrophy. No significant extra-axial fluid collection is present.  Flow present in the major intracranial arteries. The globes orbits are intact. The paranasal sinuses and mastoid air cells are clear.  Skullbase is within normal limits. Midline structures are unremarkable.  MRA HEAD FINDINGS  Mild atherosclerotic changes are noted in the ophthalmic segment of the internal carotid arteries bilaterally. A small infundibulum is noted at posterior communicating artery bilaterally. Moderate proximal  stenosis is noted at the right A1 segment. The left A1 segment is normal. 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 The MCA bifurcations are intact. There is mild attenuation of distal MCA branch vessels.  The left vertebral artery is slightly dominant to the right. The PICA origins are visualized and normal. The basilar artery is within normal limits. Both posterior cerebral arteries originate from the basilar tip. Small posterior communicating arteries are present. The PCA branch vessels demonstrate flow bilaterally.  IMPRESSION: 1. Confirmation of large nonhemorrhagic right PCA territory infarct 2. Punctate nonhemorrhagic infarct in the medial right thalamus. 3. Additional remote lacunar infarcts are present in the basal ganglia bilaterally. 4. Age advanced atrophy and white matter disease. This likely reflects the sequela of chronic microvascular ischemia. 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 6. Moderate stenosis of the proximal right A1 segment. These results were called by telephone at the time of interpretation on 11/13/2014 at 7:51 pm to Dr. Deno Etienne, who verbally acknowledged these results.   Electronically Signed   By: San Morelle M.D.   On: 11/13/2014 19:51    Scheduled Meds: . albuterol  2.5 mg Nebulization QID  . aspirin EC  81 mg Oral Daily  . atorvastatin  10 mg Oral q1800  . heparin  5,000 Units Subcutaneous 3 times per day  . levothyroxine  100 mcg Oral QAC breakfast  . mometasone-formoterol  2 puff Inhalation BID  . potassium chloride SA  20 mEq Oral BID  . vitamin B-12  1,000 mcg Oral Daily   Continuous Infusions:   Time spent: 25 minutes  [facility] [email or web address], password [email or web address] 11/14/2014, 6:19 PM  LOS: 1 day

## 2014-11-14 NOTE — Progress Notes (Signed)
Occupational Therapy Evaluation Patient Details Name: [name] MRN: [id number] DOB: [birth year] Today's Date: 11/14/2014    History of Present Illness 74 y.o. female, medical history of lung cancer, followed by Dr. Earlie Server, COPD, chronic respiratory failure on 3 L nasal cannula at baseline, patient presents with complaints of loss of vision, patient reports poor vision at baseline secondary to cataract, reports woke up Monday, with blurry vision, and decreased visual field in the left. MRI + R nonhemorragic infarct PCA distribution and punctate infarct R thalamus.    Clinical Impression   PTA, pt lived with daughter and was independent with ADL and mobility. Pt presents with apparent L homonymous quadranopsia. Pt plans to follow up with her eye doctor. Recommend pt follow up with OT at the neuro outpt clinic to further address compensatory techniques regarding her field cut. All further OT can be addressed in the outpt setting.     Follow Up Recommendations  Outpatient OT;Supervision - Intermittent    Equipment Recommendations  None recommended by OT    Recommendations for Other Services       Precautions / Restrictions Precautions Precautions: Fall (visionimpairment)      Mobility Bed Mobility Overal bed mobility: Independent                Transfers Overall transfer level: Independent                    Balance Overall balance assessment: No apparent balance deficits (not formally assessed)                                          ADL Overall ADL's : Needs assistance/impaired                                     Functional mobility during ADLs: Supervision/safety General ADL Comments: pt overall set up for ADL. Pt will have daughter to assist at home. discussed safety oncerns with loss of L lower field of vision.      Vision Vision Assessment?: Yes Eye Alignment: Within Functional Limits Ocular Range of Motion:  Within Functional Limits Alignment/Gaze Preference: Within Defined Limits Tracking/Visual Pursuits: Decreased smoothness of horizontal tracking Saccades: Additional eye shifts occurred during testing Convergence: Within functional limits Visual Fields: Left inferior homonymous quadranopsia Additional Comments: Pt with appaent L quadranopsia   Perception Perception Perception Tested?: Yes Comments: appers WFL   Praxis Praxis Praxis tested?: Within functional limits    Pertinent Vitals/Pain Pain Assessment: No/denies pain     Hand Dominance Right   Extremity/Trunk Assessment Upper Extremity Assessment Upper Extremity Assessment: Overall WFL for tasks assessed   Lower Extremity Assessment Lower Extremity Assessment: Defer to PT evaluation   Cervical / Trunk Assessment Cervical / Trunk Assessment: Normal   Communication Communication Communication: No difficulties   Cognition Arousal/Alertness: Awake/alert Behavior During Therapy: WFL for tasks assessed/performed Overall Cognitive Status: Within Functional Limits for tasks assessed                     General Comments       Exercises Exercises: Other exercises Other Exercises Other Exercises: Educated on scanning environament and using anchors to compensate for field loss   Shoulder Instructions      Home Living Family/patient expects to be discharged to:: Private residence  Living Arrangements: Children Available Help at Discharge: Family;Available 24 hours/day Type of Home: House Home Access: Stairs to enter [employer] of Steps: 4 Entrance Stairs-Rails: Left Home Layout: One level     Bathroom Shower/Tub: Corporate investment banker: Standard Bathroom Accessibility: Yes How Accessible: Accessible via walker Home Equipment: [place] - 2 wheels;Cane - single point;Other (comment) (has chair she uses in tub)          Prior Functioning/Environment Level of Independence:  Independent        Comments: does not drive    OT Diagnosis: Disturbance of vision   OT Problem List: Impaired vision/perception   OT Treatment/Interventions:      OT Goals(Current goals can be found in the care plan section) Acute Rehab OT Goals Patient Stated Goal: to be safe OT Goal Formulation: All assessment and education complete, DC therapy  OT Frequency:     Barriers to D/C:            Co-evaluation              End of Session Equipment Utilized During Treatment: Oxygen (on 3L at home) Nurse Communication: Mobility status  Activity Tolerance: Patient tolerated treatment well Patient left: in chair;with call bell/phone within reach   Time: 1038-1106 OT Time Calculation (min): 28 min Charges:  OT General Charges $OT Visit: 1 Procedure OT Evaluation $Initial OT Evaluation Tier I: 1 Procedure OT Treatments $Therapeutic Activity: 8-22 mins G-Codes:    Deejay Koppelman,[place] 12/10/14, 11:22 AM   Maurie Boettcher, OTR/L  [phone] 2014-12-10

## 2014-11-14 NOTE — Progress Notes (Signed)
STROKE TEAM PROGRESS NOTE   HISTORY [name] is an 74 y.o. female with a past medical history significant for HTN, thyroid disease, pernicious anemia, COPD, and lung cancer, presents to the ED for evaluation of the above stated symptoms. She indicated that she woke up 2 days ago with a severe HA and couldn't see from either eye. The HA eventually went away but the vision loss did not improved and she went to see her ophthalmologist who discovered a left inferior visual field defect and sent the patient to the ED with concern for an acute stroke. Patient denies vertigo, double vision, difficulty swallowing, unsteadiness, slurred speech, confusion, bladder/bnowel dysfunction, muscle pain, cramps, fatigue, or language impairment.  CT/MRI brain were personally reviewed and demonstrated right medial occipital lobe consistent with acute/subacute infarct. Pertinent available labs were individually reviewed: INR 1.10, K 3.1, normal white count and platelets.  Date last known well: 05/12/14 Time last known well: evening  tPA Given: no, out of the window   SUBJECTIVE (INTERVAL HISTORY) The patient's daughter is at the bedside. The patient still has visual problems. She was instructed not to drive until cleared by her  physician. Dr. Leonie Man also discussed the findings from her MRA which included an incidental cerebral aneurysm. This will need to be followed on an outpatient basis. The patient states there is no family history of ruptured aneurysms.    OBJECTIVE Temp:  [97.9 F (36.6 C)-99 F (37.2 C)] 98.5 F (36.9 C) (08/11 0600) Pulse Rate:  [67-105] 67 (08/11 0600) Cardiac Rhythm:  [-] Normal sinus rhythm (08/10 2058) Resp:  [16-28] 16 (08/11 0600) BP: (114-206)/(58-118) 137/66 mmHg (08/11 0600) SpO2:  [98 %-100 %] 100 % (08/11 0600) Weight:  [53 kg (116 lb 13.5 oz)-53.524 kg (118 lb)] 53 kg (116 lb 13.5 oz) (08/10 2037)   Recent Labs Lab 11/13/14 1925  GLUCAP 115*    Recent Labs Lab  11/13/14 1736 11/13/14 1744  NA 140 140  K 3.2* 3.1*  CL 105 103  CO2 26  --   GLUCOSE 134* 130*  BUN 17 16  CREATININE 1.03* 1.00  CALCIUM 8.7*  --     Recent Labs Lab 11/13/14 1736  AST 17  ALT 11*  ALKPHOS 118  BILITOT 0.7  PROT 6.3*  ALBUMIN 3.6    Recent Labs Lab 11/13/14 1736 11/13/14 1744  WBC 4.7  --   HGB 8.9* 9.2*  HCT 28.6* 27.0*  MCV 88.3  --   PLT 292  --    No results for input(s): CKTOTAL, CKMB, CKMBINDEX, TROPONINI in the last 168 hours.  Recent Labs  11/13/14 1736  LABPROT 14.4  INR 1.10   No results for input(s): COLORURINE, LABSPEC, PHURINE, GLUCOSEU, HGBUR, BILIRUBINUR, KETONESUR, PROTEINUR, UROBILINOGEN, NITRITE, LEUKOCYTESUR in the last 72 hours.  Invalid input(s): APPERANCEUR     Component Value Date/Time   CHOL 150 11/14/2014 0630   TRIG 114 11/14/2014 0630   HDL 45 11/14/2014 0630   CHOLHDL 3.3 11/14/2014 0630   VLDL 23 11/14/2014 0630   LDLCALC 82 11/14/2014 0630   No results found for: HGBA1C No results found for: LABOPIA, COCAINSCRNUR, LABBENZ, AMPHETMU, THCU, LABBARB  No results for input(s): ETH in the last 168 hours.   IMAGING  Ct Head Wo Contrast 11/13/2014    1. Cortically based infarct involving the right PCA distribution. Likely subacute, given absence of significant mass effect.  2. Cerebral/cerebellar atrophy with remote scattered lacunar infarcts.      Mr Jodene Nam  Head Wo Contrast 11/13/2014    1. Confirmation of large nonhemorrhagic right PCA territory infarct  2. Punctate nonhemorrhagic infarct in the medial right thalamus.  3. Additional remote lacunar infarcts are present in the basal ganglia bilaterally.  4. Age advanced atrophy and white matter disease. This likely reflects the sequela of chronic microvascular ischemia.  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  6. Moderate stenosis of the proximal right A1 segment.     PHYSICAL EXAM Pleasant elderly African-American lady not in distress.  . Afebrile. Head is nontraumatic. Neck is supple without bruit.    Cardiac exam no murmur or gallop. Lungs are clear to auscultation. Distal pulses are well felt.  Neurological Exam :  Awake alert oriented 3. No aphasia, dysarthria or apraxia. Follows commands well. Speech is clear. Pupils 3 mm equal reactive. Extraocular movements are full range without nystagmus. Left partial homonymous hemianopsia. Fundi were not visualized. Face is symmetric without weakness tongue is midline. Motor system exam reveals no upper or lower extremity drift. Symmetric and equal strength in all 4 extremities. Sensation is intact. Reflexes are symmetric. Plantars are downgoing. Gait was not tested.    ASSESSMENT/PLAN Ms. [name] is a 74 y.o. female with history of hypertension, COPD, lung cancer, thyroid disease, and pernicious anemia presenting with bilateral left visual field deficits .She did not receive IV t-PA due to late presentation.  Strokes:  Non-dominant infarcts probably embolic possibly secondary to lung cancer.  Resultant bilateral left visual field deficits  MRI large nonhemorrhagic right PCA territory infarct -  Punctate nonhemorrhagic infarct in the medial right thalamus.  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   Carotid Doppler Bilateral: 1-39% ICA stenosis. Vertebral artery flow is antegrade.   2D Echo pending  Lower extremity Dopplers - pending  LDL 82  HgbA1c pending  Subcutaneous heparin for VTE prophylaxis  Diet Heart Room service appropriate?: Yes; Fluid consistency:: Thin  no antithrombotic prior to admission, now on aspirin 81 mg orally every day  Patient counseled to be compliant with her antithrombotic medications  Ongoing aggressive stroke risk factor management  Therapy recommendations: No follow-up physical therapy recommended  Disposition: Pending  Hypertension  Home meds: No antihypertensives medications prior to  admission  Stable   Hyperlipidemia  Home meds: No lipid lowering medications prior to admission  LDL 82, goal < 70  Add low-dose Lipitor 10 mg daily  Continue statin at discharge  Diabetes  HgbA1c pending, goal < 7.0  Controlled  Other Stroke Risk Factors  Advanced age  Cigarette smoker, quit smoking in 2008  Hx strokes by MRI  Lung cancer  Other Active Problems  Hypokalemia - supplementation ordered  Anemia   Other Pertinent History  The patient is scheduled for a chest CT at [facility] on Monday in preparation for a follow-up appointment with Dr. Earlie Server. Could be performed here if still an inpatient.   PLAN  TEE  LE Duplex  Outpatient follow-up for cerebral aneurysm (incidental finding on MRA) Repeat MRA in 6 months   Hospital day # [place] PA-C [facility] Pager [phone] 11/14/2014, 1:20 PM I have personally examined this patient, reviewed notes, independently viewed imaging studies, participated in medical decision making and plan of care. I have made any additions or clarifications directly to the above note. Agree with note above. She has presented with. Headache and  left-sided visual deficits secondary to embolic right posterior cerebral artery infarct etiology to be  determined and remains at risk for neurological worsening, recurrent stroke/TIAs and needs ongoing stroke evaluation and aggressive risk factor modification. Continue aspirin for now. May need TEE to evaluate for marantic endocarditis versus cardiac source of embolism.   Antony Contras, MD Medical Director [facility] Pager: [phone] 11/14/2014 10:47 PM     To contact Stroke Continuity provider, please refer to [email or web address] After hours, contact General Neurology

## 2014-11-14 NOTE — Progress Notes (Signed)
*  PRELIMINARY RESULTS* Vascular Ultrasound Carotid Duplex (Doppler) has been completed.  Preliminary findings: Bilateral:  1-39% ICA stenosis.  Vertebral artery flow is antegrade.      Landry Mellow, RDMS, RVT  11/14/2014, 11:57 AM

## 2014-11-14 NOTE — Progress Notes (Signed)
    [facility] has been requested to perform a transesophageal echocardiogram on 11/14/2014 for stroke.  After careful review of history and examination, the risks and benefits of transesophageal echocardiogram have been explained including risks of esophageal damage, perforation (1:10,000 risk), bleeding, pharyngeal hematoma as well as other potential complications associated with conscious sedation including aspiration, arrhythmia, respiratory failure and death. Alternatives to treatment were discussed, questions were answered. Patient is willing to proceed.   74 yo female with lung CA, HTN, thyroid dx, pernicious anemia, COPD presented to the ED on 8/11 for 2 days onset of severe HA and loss of vision. She saw her ophthalmologist who discovered L visual field defect. MRI of brain showed large nonhemorrhagic R PCA territory infarct and punctate nonhemorrhagic infact in medial R thalamus. Cardiology consulted for TEE for r/o embolic source given her hypercoagulable state with lung CA. No TTE done yet. She is hemodynamically stable. Occasionally has BP of 200, however largely in 130-160s. Not on BP med at this time. She is clear of mind and understand all risk associated with the procedure. Labs review, does have some anemia, however appears to be stable on repeat lab.     Almyra Deforest, [place] 11/14/2014 6:09 PM

## 2014-11-14 NOTE — Evaluation (Signed)
Physical Therapy Evaluation Patient Details Name: [name] MRN: [id number] DOB: [birth year] Today's Date: 11/14/2014   History of Present Illness  74 y.o. female, medical history of lung cancer, followed by Dr. Earlie Server, COPD, chronic respiratory failure on 3 L nasal cannula at baseline, patient presents with complaints of loss of vision, patient reports poor vision at baseline secondary to cataract, reports woke up Monday, with blurry vision, and decreased visual field in the left. MRI + R nonhemorragic infarct PCA distribution and punctate infarct R thalamus.   Clinical Impression  Patient mobilizing well despite visual impairments. Educated patient and family regarding safety with mobility and compensatory strategies for visual deficits. Patient safely ambulated in hall and performed stair negotiation. At this time, do not feel patient will need continued acute PT. Will sign off.    Follow Up Recommendations No PT follow up    Equipment Recommendations  None recommended by PT    Recommendations for Other Services       Precautions / Restrictions Precautions Precautions: Fall (visionimpairment) Restrictions Weight Bearing Restrictions: No      Mobility  Bed Mobility Overal bed mobility: Independent                Transfers Overall transfer level: Independent                  Ambulation/Gait Ambulation/Gait assistance: Independent Ambulation Distance (Feet): 280 Feet Assistive device: None   Gait velocity: decreased Gait velocity interpretation: Below normal speed for age/gender General Gait Details: educated on safety with ambulation and visual deficits, educated regardnig scanning techniques as well as tactile feedback  Stairs Stairs: Yes Stairs assistance: Modified independent (Device/Increase time) Stair Management: One rail Left;Step to pattern Number of Stairs: 4 General stair comments: no physical assist required  Wheelchair Mobility     Modified Rankin (Stroke Patients Only)       Balance Overall balance assessment: No apparent balance deficits (not formally assessed)                                           Pertinent Vitals/Pain Pain Assessment: No/denies pain    Home Living Family/patient expects to be discharged to:: Private residence Living Arrangements: Children Available Help at Discharge: Family;Available 24 hours/day Type of Home: House Home Access: Stairs to enter Entrance Stairs-Rails: Left Entrance Stairs-Number of Steps: 4 Home Layout: One level Home Equipment: Walker - 2 wheels;Cane - single point;Other (comment) (has chair she uses in tub)      Prior Function Level of Independence: Independent         Comments: does not drive     Hand Dominance   Dominant Hand: Right    Extremity/Trunk Assessment   Upper Extremity Assessment: Overall WFL for tasks assessed           Lower Extremity Assessment: Overall WFL for tasks assessed      Cervical / Trunk Assessment: Normal  Communication   Communication: No difficulties  Cognition Arousal/Alertness: Awake/alert Behavior During Therapy: WFL for tasks assessed/performed Overall Cognitive Status: Within Functional Limits for tasks assessed                      General Comments      Exercises Other Exercises Other Exercises: Educated on scanning techniques Other Exercises: Educated on tactile cues for safety  Other Exercises: educated on safety in crowded  environments      Assessment/Plan    PT Assessment Patent does not need any further PT services  PT Diagnosis Difficulty walking   PT Problem List    PT Treatment Interventions     PT Goals (Current goals can be found in the Care Plan section) Acute Rehab PT Goals Patient Stated Goal: to be safe PT Goal Formulation: All assessment and education complete, DC therapy    Frequency     Barriers to discharge        Co-evaluation                End of Session Equipment Utilized During Treatment: Gait belt;Oxygen Activity Tolerance: Patient tolerated treatment well Patient left: in chair;with call bell/phone within reach;with family/visitor present Nurse Communication: Mobility status         Time: 8441-7127 PT Time Calculation (min) (ACUTE ONLY): 24 min   Charges:   PT Evaluation $Initial PT Evaluation Tier I: 1 Procedure PT Treatments $Self Care/Home Management: 8-22   PT G CodesDuncan Reese 11/26/14, 1:01 PM [name], [place] DPT  [phone]

## 2014-11-15 ENCOUNTER — Encounter (HOSPITAL_COMMUNITY)
Admission: EM | Disposition: A | Discharge status: Home / Self Care | Payer: Self-pay | Source: Home / Self Care | Attending: Internal Medicine

## 2014-11-15 ENCOUNTER — Telehealth: Payer: Self-pay | Admitting: *Deleted

## 2014-11-15 ENCOUNTER — Encounter (HOSPITAL_COMMUNITY): Payer: Self-pay | Admitting: *Deleted

## 2014-11-15 ENCOUNTER — Inpatient Hospital Stay (HOSPITAL_COMMUNITY): Payer: PPO

## 2014-11-15 DIAGNOSIS — I6789 Other cerebrovascular disease: Secondary | ICD-10-CM

## 2014-11-15 DIAGNOSIS — I639 Cerebral infarction, unspecified: Secondary | ICD-10-CM

## 2014-11-15 HISTORY — PX: TEE WITHOUT CARDIOVERSION: SHX5443

## 2014-11-15 LAB — HEMOGLOBIN A1C
Hgb A1c MFr Bld: 5.1 % (ref 4.8–5.6)
Hgb A1c MFr Bld: 5.2 % (ref 4.8–5.6)
MEAN PLASMA GLUCOSE: 100 mg/dL
Mean Plasma Glucose: 103 mg/dL

## 2014-11-15 SURGERY — ECHOCARDIOGRAM, TRANSESOPHAGEAL
Anesthesia: Moderate Sedation

## 2014-11-15 MED ORDER — ATORVASTATIN CALCIUM 10 MG PO TABS: 10.0000 mg | ORAL_TABLET | Freq: Every day | ORAL | Status: DC

## 2014-11-15 MED ORDER — SODIUM CHLORIDE 0.9 % IV SOLN
INTRAVENOUS | Status: DC
  Administered 2014-11-15: 500 mL via INTRAVENOUS

## 2014-11-15 MED ORDER — MIDAZOLAM HCL 5 MG/ML IJ SOLN
INTRAMUSCULAR | Status: AC
  Filled 2014-11-15: qty 2

## 2014-11-15 MED ORDER — FENTANYL CITRATE (PF) 100 MCG/2ML IJ SOLN
INTRAMUSCULAR | Status: AC
  Filled 2014-11-15: qty 2

## 2014-11-15 MED ORDER — FENTANYL CITRATE (PF) 100 MCG/2ML IJ SOLN
INTRAMUSCULAR | Status: DC | PRN
  Administered 2014-11-15: 25 ug via INTRAVENOUS
  Administered 2014-11-15 (×4): 12.5 ug via INTRAVENOUS

## 2014-11-15 MED ORDER — DIPHENHYDRAMINE HCL 50 MG/ML IJ SOLN
INTRAMUSCULAR | Status: AC
  Filled 2014-11-15: qty 1

## 2014-11-15 MED ORDER — BUTAMBEN-TETRACAINE-BENZOCAINE 2-2-14 % EX AERO
INHALATION_SPRAY | CUTANEOUS | Status: DC | PRN
Start: 2014-11-15 — End: 2014-11-15
  Administered 2014-11-15: 2 via TOPICAL

## 2014-11-15 MED ORDER — ASPIRIN 81 MG PO TBEC: 81.0000 mg | DELAYED_RELEASE_TABLET | Freq: Every day | ORAL | Status: DC

## 2014-11-15 MED ORDER — MIDAZOLAM HCL 10 MG/2ML IJ SOLN
INTRAMUSCULAR | Status: DC | PRN
  Administered 2014-11-15 (×3): 2 mg via INTRAVENOUS

## 2014-11-15 NOTE — Care Management Important Message (Signed)
Important Message  Patient Details  Name: [name] MRN: [id number] Date of Birth: 10/26/1940   Medicare Important Message Given:  Yes-second notification given    Delorse Lek 11/15/2014, 3:17 PM

## 2014-11-15 NOTE — Progress Notes (Signed)
*  Preliminary Results* Bilateral lower extremity venous duplex completed. Bilateral lower extremities are negative for deep vein thrombosis. There is no evidence of Baker's cyst bilaterally.  11/15/2014  Maudry Mayhew, RVT, RDCS, RDMS

## 2014-11-15 NOTE — Progress Notes (Signed)
Pt discharging with daughter home at this time taking all personal belongings. Neuro intact, no note distress. IV discontinued, dry dressing applied. Discharge instructions along with prescription provided with verbal understanding. Pt is aware of follow up appt to be scheduled. Pt denies pain or discomfort.

## 2014-11-15 NOTE — Interval H&P Note (Signed)
History and Physical Interval Note:  11/15/2014 2:09 PM  [name]  has presented today for surgery, with the diagnosis of STROKE  The various methods of treatment have been discussed with the patient and family. After consideration of risks, benefits and other options for treatment, the patient has consented to  Procedure(s): TRANSESOPHAGEAL ECHOCARDIOGRAM (TEE) (N/A) as a surgical intervention .  The patient's history has been reviewed, patient examined, no change in status, stable for surgery.  I have reviewed the patient's chart and labs.  Questions were answered to the patient's satisfaction.     TURNER,TRACI R

## 2014-11-15 NOTE — Telephone Encounter (Signed)
OK to hold Tarceva until discharge.

## 2014-11-15 NOTE — Progress Notes (Signed)
Pt returned to unit alert, verbal. Stable, neuro intact. No noted distress. Family at bedside. Call bell within reach. Will continue to monitor.

## 2014-11-15 NOTE — Discharge Summary (Signed)
Physician Discharge Summary  [name]:[id number] DOB: 11/06/1940 DOA: 11/13/2014  PCP: Glo Herring., MD  Admit date: 11/13/2014 Discharge date: 11/15/2014  Time spent: greater than 30 minutes  Recommendations for Outpatient Follow-up:   Repeat MRA brain 6 months. Follow up aneurism  Monitor potassium  Monitor LFTs on statin  Discharge Diagnoses:  Principal Problem:   Acute right PCA stroke Active Problems:   Lung cancer, upper lobe   COPD (chronic obstructive pulmonary disease)   Chronic respiratory failure   Anemia   Hypokalemia   Essential hypertension   Discharge Condition: stable  Diet recommendation: heart healthy  Filed Weights   11/13/14 2037  Weight: 53 kg (116 lb 13.5 oz)    History of present illness:  74 y.o. female with a past medical history significant for HTN, thyroid disease, pernicious anemia, COPD, and lung cancer, presents to the ED for evaluation of the above stated symptoms. She indicated that she woke up 2 days ago with a severe HA and couldn't see from either eye. The HA eventually went away but the vision loss did not improved and she went to see her ophthalmologist who discovered a left inferior visual field defect and sent the patient to the ED with concern for an acute stroke. Patient denies vertigo, double vision, difficulty swallowing, unsteadiness, slurred speech, confusion, bladder/bnowel dysfunction, muscle pain, cramps, fatigue, or language impairment.  CT/MRI brain were personally reviewed and demonstrated right medial occipital lobe consistent with acute/subacute infarct. Pertinent available labs were individually reviewed: INR 1.10, K 3.1, normal white count and platelets.  TPA not given. Out of window.   Hospital Course:  Admitted to telemetry. Neurology consulted.  Strokes: Non-dominant infarcts probably embolic possibly secondary to lung cancer.  Resultant bilateral left visual field deficits  MRI large nonhemorrhagic  right PCA territory infarct - Punctate nonhemorrhagic infarct in the medial right thalamus.  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   Carotid Doppler Bilateral: 1-39% ICA stenosis. Vertebral artery flow is antegrade.   2D Echo Left ventricle: The cavity size was normal. Wall thickness was increased in a pattern of mild LVH. There was focal basal hypertrophy. Systolic function was normal. The estimated ejection fraction was in the range of 50% to 55%. Wall motion was normal; there were no regional wall motion abnormalities.  Lower extremity Dopplers - negative for DVT  LDL 82  HgbA1c 5.2  no antithrombotic prior to admission, now on aspirin 81 mg orally every day  TEE attempted. Unable to pass probe. Outpatient follow-up for cerebral aneurysm (incidental finding on MRA) Repeat MRA in 6 months   Hyperlipidemia  Home meds: No lipid lowering medications prior to admission  LDL 82, goal < 70  Add low-dose Lipitor 10 mg daily  Hypokalemia - repleted   Procedures:  TEE attempt  Consultations:  neurology  Discharge Exam: Filed Vitals:   11/15/14 1520  BP:   Pulse: 67  Temp: 97.9 F (36.6 C)  Resp: 17    General: a and o Cardiovascular: RRR Respiratory: CTA Neuro: left visual field cut  Discharge Instructions   Discharge Instructions    Activity as tolerated - No restrictions    Complete by:  As directed      Diet - low sodium heart healthy    Complete by:  As directed           Current Discharge Medication List    START taking these medications   Details  aspirin EC 81 MG EC tablet  Take 1 tablet (81 mg total) by mouth daily.    atorvastatin (LIPITOR) 10 MG tablet Take 1 tablet (10 mg total) by mouth daily at 6 PM. Qty: 30 tablet, Refills: 1      CONTINUE these medications which have NOT CHANGED   Details  acetaminophen (TYLENOL) 500 MG tablet Take 500-1,000 mg by mouth every 6 (six) hours as needed for moderate  pain.     albuterol (PROVENTIL) (2.5 MG/3ML) 0.083% nebulizer solution Take 2.5 mg by nebulization every 4 (four) hours. Or Four times daily    ALPRAZolam (XANAX) 1 MG tablet Take 1 mg by mouth 4 (four) times daily as needed. For anxiety and/or sleep    clindamycin (CLEOCIN T) 1 % external solution Apply topically 2 (two) times daily. Qty: 30 mL, Refills: 1    Cyanocobalamin (VITAMIN B-12) 1000 MCG SUBL Place 1 tablet under the tongue daily.    ferrous sulfate 325 (65 FE) MG tablet Take 325 mg by mouth 3 (three) times daily with meals.    Fluticasone-Salmeterol (ADVAIR) 250-50 MCG/DOSE AEPB Inhale 1 puff into the lungs every 12 (twelve) hours.    guaiFENesin (MUCINEX) 600 MG 12 hr tablet Take 600 mg by mouth daily as needed for cough.     levothyroxine (SYNTHROID, LEVOTHROID) 100 MCG tablet Take 100 mcg by mouth daily before breakfast.     loperamide (IMODIUM) 2 MG capsule Take 2 mg by mouth as needed for diarrhea or loose stools.    loratadine (CLARITIN) 10 MG tablet Take 10 mg by mouth daily as needed for allergies.     NON FORMULARY OXYGEN      24/7    potassium chloride SA (K-DUR,KLOR-CON) 20 MEQ tablet Take 1 tablet (20 mEq total) by mouth 2 (two) times daily. Qty: 14 tablet, Refills: 0    TARCEVA 150 MG tablet TAKE 1 TABLET BY MOUTH DAILY. TAKE ON AN EMPTY STOMACH 1 HOUR BEFORE MEALS OR 2 HOURS AFTER. Qty: 30 tablet, Refills: 2    torsemide (DEMADEX) 20 MG tablet Take 10 mg by mouth daily as needed (fluid retention).     Vitamin D, Ergocalciferol, (DRISDOL) 50000 UNITS CAPS capsule Take 50,000 Units by mouth every 30 (thirty) days. Fridays       Allergies  Allergen Reactions  . Codeine Nausea And Vomiting    Projectile vomiting  . Penicillins Rash    Tolerates Rocephine  . Pravastatin Sodium Other (See Comments)    Muscle cramps   Follow-up Information    Follow up with Glo Herring., MD.   Specialty:  Internal Medicine   Contact information:   [address] [place] [address] [phone]        The results of significant diagnostics from this hospitalization (including imaging, microbiology, ancillary and laboratory) are listed below for reference.    Significant Diagnostic Studies: Ct Head Wo Contrast  11/13/2014   CLINICAL DATA:  Headache with left-sided visual field blindness starting on Monday. Possible stroke. History lung cancer.  EXAM: CT HEAD WITHOUT CONTRAST  TECHNIQUE: Contiguous axial images were obtained from the base of the skull through the vertex without intravenous contrast.  COMPARISON:  MRI of 05/31/2014.  Head CT of 03/16/2012.  FINDINGS: Sinuses/Soft tissues: Clear paranasal sinuses and mastoid air cells.  Intracranial: Cerebral and cerebellar atrophy. Left cerebellar hemisphere low-density focus is chronic and likely related to remote lacunar infarct. Right caudate head remote lacunar infarct as well.  Right medial occipital lobe cortically based hypoattenuation is consistent with subacute  infarct. No significant mass effect or midline shift. No hemorrhage, hydrocephalus, intra-axial, or extra-axial fluid collection.  IMPRESSION: 1. Cortically based infarct involving the right PCA distribution. Likely subacute, given absence of significant mass effect. 2. Cerebral/cerebellar atrophy with remote scattered lacunar infarcts. These results were called by telephone at the time of interpretation on 11/13/2014 at 5:29 pm to Dr. Deno Etienne , who verbally acknowledged these results.   Electronically Signed   By: Abigail Miyamoto M.D.   On: 11/13/2014 17:30   Mr Jodene Nam Head Wo Contrast  11/13/2014   CLINICAL DATA:  Left-sided visual field defect.  Acute infarct.  EXAM: MRI HEAD WITHOUT CONTRAST  MRA HEAD WITHOUT CONTRAST  TECHNIQUE: Multiplanar, multiecho pulse sequences of the brain and surrounding structures were obtained without intravenous contrast. Angiographic images of the head were obtained using MRA technique without  contrast.  COMPARISON:  CT head without contrast from the same day. MRI brain 05/31/2014.  FINDINGS: MRI HEAD FINDINGS  The diffusion-weighted images confirm an acute nonhemorrhagic right occipital lobe infarct. There is also a punctate infarct in the medial right thalamus. Extensive T2 changes are associated with the areas of acute infarct. Remote lacunar infarcts are present in the right caudate head and bilateral thalami. There is remote lacunar infarct in the right lentiform nucleus. Mild periventricular and subcortical T2 changes bilaterally likely reflect the sequela of chronic microvascular ischemia.  No acute hemorrhage or mass lesion is present. The ventricles are proportionate to the degree of atrophy. No significant extra-axial fluid collection is present.  Flow present in the major intracranial arteries. The globes orbits are intact. The paranasal sinuses and mastoid air cells are clear.  Skullbase is within normal limits. Midline structures are unremarkable.  MRA HEAD FINDINGS  Mild atherosclerotic changes are noted in the ophthalmic segment of the internal carotid arteries bilaterally. A small infundibulum is noted at posterior communicating artery bilaterally. Moderate proximal stenosis is noted at the right A1 segment. The left A1 segment is normal. 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 The MCA bifurcations are intact. There is mild attenuation of distal MCA branch vessels.  The left vertebral artery is slightly dominant to the right. The PICA origins are visualized and normal. The basilar artery is within normal limits. Both posterior cerebral arteries originate from the basilar tip. Small posterior communicating arteries are present. The PCA branch vessels demonstrate flow bilaterally.  IMPRESSION: 1. Confirmation of large nonhemorrhagic right PCA territory infarct 2. Punctate nonhemorrhagic infarct in the medial right  thalamus. 3. Additional remote lacunar infarcts are present in the basal ganglia bilaterally. 4. Age advanced atrophy and white matter disease. This likely reflects the sequela of chronic microvascular ischemia. 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 6. Moderate stenosis of the proximal right A1 segment. These results were called by telephone at the time of interpretation on 11/13/2014 at 7:51 pm to Dr. Deno Etienne, who verbally acknowledged these results.   Electronically Signed   By: San Morelle M.D.   On: 11/13/2014 19:51   Mr Brain Wo Contrast  11/13/2014   CLINICAL DATA:  Left-sided visual field defect.  Acute infarct.  EXAM: MRI HEAD WITHOUT CONTRAST  MRA HEAD WITHOUT CONTRAST  TECHNIQUE: Multiplanar, multiecho pulse sequences of the brain and surrounding structures were obtained without intravenous contrast. Angiographic images of the head were obtained using MRA technique without contrast.  COMPARISON:  CT head without contrast from the same day. MRI  brain 05/31/2014.  FINDINGS: MRI HEAD FINDINGS  The diffusion-weighted images confirm an acute nonhemorrhagic right occipital lobe infarct. There is also a punctate infarct in the medial right thalamus. Extensive T2 changes are associated with the areas of acute infarct. Remote lacunar infarcts are present in the right caudate head and bilateral thalami. There is remote lacunar infarct in the right lentiform nucleus. Mild periventricular and subcortical T2 changes bilaterally likely reflect the sequela of chronic microvascular ischemia.  No acute hemorrhage or mass lesion is present. The ventricles are proportionate to the degree of atrophy. No significant extra-axial fluid collection is present.  Flow present in the major intracranial arteries. The globes orbits are intact. The paranasal sinuses and mastoid air cells are clear.  Skullbase is within normal limits. Midline structures are unremarkable.  MRA HEAD FINDINGS  Mild  atherosclerotic changes are noted in the ophthalmic segment of the internal carotid arteries bilaterally. A small infundibulum is noted at posterior communicating artery bilaterally. Moderate proximal stenosis is noted at the right A1 segment. The left A1 segment is normal. 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 The MCA bifurcations are intact. There is mild attenuation of distal MCA branch vessels.  The left vertebral artery is slightly dominant to the right. The PICA origins are visualized and normal. The basilar artery is within normal limits. Both posterior cerebral arteries originate from the basilar tip. Small posterior communicating arteries are present. The PCA branch vessels demonstrate flow bilaterally.  IMPRESSION: 1. Confirmation of large nonhemorrhagic right PCA territory infarct 2. Punctate nonhemorrhagic infarct in the medial right thalamus. 3. Additional remote lacunar infarcts are present in the basal ganglia bilaterally. 4. Age advanced atrophy and white matter disease. This likely reflects the sequela of chronic microvascular ischemia. 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 6. Moderate stenosis of the proximal right A1 segment. These results were called by telephone at the time of interpretation on 11/13/2014 at 7:51 pm to Dr. Deno Etienne, who verbally acknowledged these results.   Electronically Signed   By: San Morelle M.D.   On: 11/13/2014 19:51    Microbiology: No results found for this or any previous visit (from the past 240 hour(s)).   Labs: Basic Metabolic Panel:  Recent Labs Lab 11/13/14 1736 11/13/14 1744 11/14/14 0630  NA 140 140  --   K 3.2* 3.1*  --   CL 105 103  --   CO2 26  --   --   GLUCOSE 134* 130*  --   BUN 17 16  --   CREATININE 1.03* 1.00  --   CALCIUM 8.7*  --   --   MG  --   --  1.7   Liver Function Tests:  Recent Labs Lab 11/13/14 1736  AST 17  ALT  11*  ALKPHOS 118  BILITOT 0.7  PROT 6.3*  ALBUMIN 3.6   No results for input(s): LIPASE, AMYLASE in the last 168 hours. No results for input(s): AMMONIA in the last 168 hours. CBC:  Recent Labs Lab 11/13/14 1736 11/13/14 1744  WBC 4.7  --   HGB 8.9* 9.2*  HCT 28.6* 27.0*  MCV 88.3  --   PLT 292  --    Cardiac Enzymes: No results for input(s): CKTOTAL, CKMB, CKMBINDEX, TROPONINI in the last 168 hours. BNP: BNP (last 3 results) No results for input(s): BNP in the last 8760 hours.  ProBNP (last 3 results) No results for  input(s): PROBNP in the last 8760 hours.  CBG:  Recent Labs Lab 11/13/14 1925  GLUCAP 115*       Signed:  Troy L  [facility] 11/15/2014, 4:01 PM

## 2014-11-15 NOTE — Progress Notes (Signed)
Echocardiogram 2D Echocardiogram has been performed.  [name] 11/15/2014, 11:14 AM

## 2014-11-15 NOTE — Telephone Encounter (Signed)
Called Lyn, per MD- ok to hold Tarceva until discharge.  No further concern

## 2014-11-15 NOTE — Telephone Encounter (Signed)
TC from pt's daughter, [name]. She states that pt is in the hospital @ [facility] , room # 8, [place] with diagnosis of CVA which has affected her eyesight on the left. She was admitted on 11/13/14. Jeani Hawking states the doctors there have stopped pt's Tarceva (none yesterday or today). [facility] Hawking would like to know if this is ok and should she get it refilled as she would need to soon if it is to be resumed. [facility] Hawking would like to speak with Dr. Julien Nordmann. She will be on her cell phone @ [phone] or at hospital. Room phone # is [phone]

## 2014-11-15 NOTE — Telephone Encounter (Signed)
Oncology Nurse Navigator Documentation  Oncology Nurse Navigator Flowsheets 11/15/2014  Navigator Encounter Type Other/I received a message from Sargeant regarding daughter would like to speak with me about Dr. Worthy Flank treatment plan.  I looked at past clinical notes and saw Berniece Andreas RN spoke with daughter today.  I spoke with her.  She stated she spoke with daughter about oral biologic and what to do as far as medical oncology while patient is in the hospital.    I called daughter and left a vm message to call me.  I also stated she can call the cancer center and either triage or on call doc would help if needed.    Treatment Phase Treatment  Barriers/Navigation Needs Support  Interventions Other  Time Spent with Patient 30

## 2014-11-15 NOTE — H&P (View-Only) (Signed)
STROKE TEAM PROGRESS NOTE   HISTORY [name] is an 74 y.o. female with a past medical history significant for HTN, thyroid disease, pernicious anemia, COPD, and lung cancer, presents to the ED for evaluation of the above stated symptoms. She indicated that she woke up 2 days ago with a severe HA and couldn't see from either eye. The HA eventually went away but the vision loss did not improved and she went to see her ophthalmologist who discovered a left inferior visual field defect and sent the patient to the ED with concern for an acute stroke. Patient denies vertigo, double vision, difficulty swallowing, unsteadiness, slurred speech, confusion, bladder/bnowel dysfunction, muscle pain, cramps, fatigue, or language impairment.  CT/MRI brain were personally reviewed and demonstrated right medial occipital lobe consistent with acute/subacute infarct. Pertinent available labs were individually reviewed: INR 1.10, K 3.1, normal white count and platelets.  Date last known well: 05/12/14 Time last known well: evening  tPA Given: no, out of the window   SUBJECTIVE (INTERVAL HISTORY) The patient's daughter is at the bedside. The patient still has visual problems. She was instructed not to drive until cleared by her  physician. Dr. Leonie Reese also discussed the findings from her MRA which included an incidental cerebral aneurysm. This will need to be followed on an outpatient basis. The patient states there is no family history of ruptured aneurysms.    OBJECTIVE Temp:  [97.9 F (36.6 C)-98.8 F (37.1 C)] 98.1 F (36.7 C) (08/12 1259) Pulse Rate:  [65-81] 76 (08/12 0713) Cardiac Rhythm:  [-] Normal sinus rhythm (08/11 1900) Resp:  [15-18] 16 (08/12 1259) BP: (139-179)/(66-80) 177/67 mmHg (08/12 1259) SpO2:  [99 %-100 %] 100 % (08/12 1259)   Recent Labs Lab 11/13/14 1925  GLUCAP 115*    Recent Labs Lab 11/13/14 1736 11/13/14 1744 11/14/14 0630  NA 140 140  --   K 3.2* 3.1*  --   CL 105  103  --   CO2 26  --   --   GLUCOSE 134* 130*  --   BUN 17 16  --   CREATININE 1.03* 1.00  --   CALCIUM 8.7*  --   --   MG  --   --  1.7    Recent Labs Lab 11/13/14 1736  AST 17  ALT 11*  ALKPHOS 118  BILITOT 0.7  PROT 6.3*  ALBUMIN 3.6    Recent Labs Lab 11/13/14 1736 11/13/14 1744  WBC 4.7  --   HGB 8.9* 9.2*  HCT 28.6* 27.0*  MCV 88.3  --   PLT 292  --    No results for input(s): CKTOTAL, CKMB, CKMBINDEX, TROPONINI in the last 168 hours.  Recent Labs  11/13/14 1736  LABPROT 14.4  INR 1.10   No results for input(s): COLORURINE, LABSPEC, PHURINE, GLUCOSEU, HGBUR, BILIRUBINUR, KETONESUR, PROTEINUR, UROBILINOGEN, NITRITE, LEUKOCYTESUR in the last 72 hours.  Invalid input(s): APPERANCEUR     Component Value Date/Time   CHOL 150 11/14/2014 0630   TRIG 114 11/14/2014 0630   HDL 45 11/14/2014 0630   CHOLHDL 3.3 11/14/2014 0630   VLDL 23 11/14/2014 0630   LDLCALC 82 11/14/2014 0630   Lab Results  Component Value Date   HGBA1C 5.2 11/14/2014   No results found for: LABOPIA, COCAINSCRNUR, LABBENZ, AMPHETMU, THCU, LABBARB  No results for input(s): ETH in the last 168 hours.   IMAGING  Ct Head Wo Contrast 11/13/2014    1. Cortically based infarct involving the right PCA distribution.  Likely subacute, given absence of significant mass effect.  2. Cerebral/cerebellar atrophy with remote scattered lacunar infarcts.      Mr [name] Head Wo Contrast 11/13/2014    1. Confirmation of large nonhemorrhagic right PCA territory infarct  2. Punctate nonhemorrhagic infarct in the medial right thalamus.  3. Additional remote lacunar infarcts are present in the basal ganglia bilaterally.  4. Age advanced atrophy and white matter disease. This likely reflects the sequela of chronic microvascular ischemia.  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  6. Moderate stenosis of the proximal right A1 segment.     PHYSICAL EXAM Pleasant elderly African-American lady  not in distress. . Afebrile. Head is nontraumatic. Neck is supple without bruit.    Cardiac exam no murmur or gallop. Lungs are clear to auscultation. Distal pulses are well felt.  Neurological Exam :  Awake alert oriented 3. No aphasia, dysarthria or apraxia. Follows commands well. Speech is clear. Pupils 3 mm equal reactive. Extraocular movements are full range without nystagmus. Left partial homonymous hemianopsia. Fundi were not visualized. Face is symmetric without weakness tongue is midline. Motor system exam reveals no upper or lower extremity drift. Symmetric and equal strength in all 4 extremities. Sensation is intact. Reflexes are symmetric. Plantars are downgoing. Gait was not tested.    ASSESSMENT/PLAN [name] is a 74 y.o. female with history of hypertension, COPD, lung cancer, thyroid disease, and pernicious anemia presenting with bilateral left visual field deficits .She did not receive IV t-PA due to late presentation.  Strokes:  Non-dominant infarcts probably embolic possibly secondary to lung cancer.  Resultant bilateral left visual field deficits  MRI large nonhemorrhagic right PCA territory infarct -  Punctate nonhemorrhagic infarct in the medial right thalamus.  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   Carotid Doppler Bilateral: 1-39% ICA stenosis. Vertebral artery flow is antegrade.   2D Echo Left ventricle: The cavity size was normal. Wall thickness was increased in a pattern of mild LVH. There was focal basal hypertrophy. Systolic function was normal. The estimated ejection fraction was in the range of 50% to 55%. Wall motion was normal; there were no regional wall motion abnormalities.  Lower extremity Dopplers - negative for DVT  LDL 82  HgbA1c 5.2  Subcutaneous heparin for VTE prophylaxis Diet NPO time specified Except for: Sips with Meds  no antithrombotic prior to admission, now on aspirin 81 mg orally every  day  Patient counseled to be compliant with her antithrombotic medications  Ongoing aggressive stroke risk factor management  Therapy recommendations: No follow-up physical therapy recommended  Disposition: Pending  Hypertension  Home meds: No antihypertensives medications prior to admission  Stable   Hyperlipidemia  Home meds: No lipid lowering medications prior to admission  LDL 82, goal < 70  Add low-dose Lipitor 10 mg daily  Continue statin at discharge  Diabetes  HgbA1c 5.2 goal < 7.0  Controlled  Other Stroke Risk Factors  Advanced age  Cigarette smoker, quit smoking in 2008  Hx strokes by MRI  Lung cancer  Other Active Problems  Hypokalemia - supplementation ordered  Anemia   Other Pertinent History  The patient is scheduled for a chest CT at [facility] on Monday in preparation for a follow-up appointment with Dr. Earlie Server. Could be performed here if still an inpatient.   PLAN  TEE  Outpatient follow-up for cerebral aneurysm (incidental finding on MRA) Repeat MRA in 6 months   Hospital day # 2  I have personally examined this patient, reviewed notes, independently viewed imaging studies, participated in medical decision making and plan of care. I have made any additions or clarifications directly to the above note.  Check TEE and  ongoing stroke evaluation and aggressive risk factor modification. Continue aspirin for now.    Antony Contras, MD Medical Director [facility] Pager: [phone] 11/15/2014 1:05 PM     To contact Stroke Continuity provider, please refer to [email or web address] After hours, contact General Neurology

## 2014-11-15 NOTE — CV Procedure (Signed)
    PROCEDURE NOTE:  Procedure:  Transesophageal echocardiogram Operator:  Fransico Him, MD Indications:  CVA Complications: None IV Meds:Versed $RemoveBefore'6mg'[id number]$ , Fentanyl 52mcg IV  Results: Multiple attempts were made at passing TEE probe.  There was resistance met and given history of esophageal stricture, the decision was made to stop procedure.   The patient  was transferred back to their room in stable condition.  Signed: Fransico Him, MD [facility]

## 2014-11-15 NOTE — Progress Notes (Signed)
Received report from [place] in Endo. Pt stable, due to return to unit.

## 2014-11-15 NOTE — Progress Notes (Signed)
STROKE TEAM PROGRESS NOTE   HISTORY [name] is an 74 y.o. female with a past medical history significant for HTN, thyroid disease, pernicious anemia, COPD, and lung cancer, presents to the ED for evaluation of the above stated symptoms. She indicated that she woke up 2 days ago with a severe HA and couldn't see from either eye. The HA eventually went away but the vision loss did not improved and she went to see her ophthalmologist who discovered a left inferior visual field defect and sent the patient to the ED with concern for an acute stroke. Patient denies vertigo, double vision, difficulty swallowing, unsteadiness, slurred speech, confusion, bladder/bnowel dysfunction, muscle pain, cramps, fatigue, or language impairment.  CT/MRI brain were personally reviewed and demonstrated right medial occipital lobe consistent with acute/subacute infarct. Pertinent available labs were individually reviewed: INR 1.10, K 3.1, normal white count and platelets.  Date last known well: 05/12/14 Time last known well: evening  tPA Given: no, out of the window   SUBJECTIVE (INTERVAL HISTORY) The patient's daughter is at the bedside. The patient still has visual problems. She was instructed not to drive until cleared by her  physician. Dr. Leonie Man also discussed the findings from her MRA which included an incidental cerebral aneurysm. This will need to be followed on an outpatient basis. The patient states there is no family history of ruptured aneurysms.    OBJECTIVE Temp:  [97.9 F (36.6 C)-98.8 F (37.1 C)] 98.1 F (36.7 C) (08/12 1259) Pulse Rate:  [65-81] 76 (08/12 0713) Cardiac Rhythm:  [-] Normal sinus rhythm (08/11 1900) Resp:  [15-18] 16 (08/12 1259) BP: (139-179)/(66-80) 177/67 mmHg (08/12 1259) SpO2:  [99 %-100 %] 100 % (08/12 1259)   Recent Labs Lab 11/13/14 1925  GLUCAP 115*    Recent Labs Lab 11/13/14 1736 11/13/14 1744 11/14/14 0630  NA 140 140  --   K 3.2* 3.1*  --   CL 105  103  --   CO2 26  --   --   GLUCOSE 134* 130*  --   BUN 17 16  --   CREATININE 1.03* 1.00  --   CALCIUM 8.7*  --   --   MG  --   --  1.7    Recent Labs Lab 11/13/14 1736  AST 17  ALT 11*  ALKPHOS 118  BILITOT 0.7  PROT 6.3*  ALBUMIN 3.6    Recent Labs Lab 11/13/14 1736 11/13/14 1744  WBC 4.7  --   HGB 8.9* 9.2*  HCT 28.6* 27.0*  MCV 88.3  --   PLT 292  --    No results for input(s): CKTOTAL, CKMB, CKMBINDEX, TROPONINI in the last 168 hours.  Recent Labs  11/13/14 1736  LABPROT 14.4  INR 1.10   No results for input(s): COLORURINE, LABSPEC, PHURINE, GLUCOSEU, HGBUR, BILIRUBINUR, KETONESUR, PROTEINUR, UROBILINOGEN, NITRITE, LEUKOCYTESUR in the last 72 hours.  Invalid input(s): APPERANCEUR     Component Value Date/Time   CHOL 150 11/14/2014 0630   TRIG 114 11/14/2014 0630   HDL 45 11/14/2014 0630   CHOLHDL 3.3 11/14/2014 0630   VLDL 23 11/14/2014 0630   LDLCALC 82 11/14/2014 0630   Lab Results  Component Value Date   HGBA1C 5.2 11/14/2014   No results found for: LABOPIA, COCAINSCRNUR, LABBENZ, AMPHETMU, THCU, LABBARB  No results for input(s): ETH in the last 168 hours.   IMAGING  Ct Head Wo Contrast 11/13/2014    1. Cortically based infarct involving the right PCA distribution.  Likely subacute, given absence of significant mass effect.  2. Cerebral/cerebellar atrophy with remote scattered lacunar infarcts.      Mr Jodene Nam Head Wo Contrast 11/13/2014    1. Confirmation of large nonhemorrhagic right PCA territory infarct  2. Punctate nonhemorrhagic infarct in the medial right thalamus.  3. Additional remote lacunar infarcts are present in the basal ganglia bilaterally.  4. Age advanced atrophy and white matter disease. This likely reflects the sequela of chronic microvascular ischemia.  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  6. Moderate stenosis of the proximal right A1 segment.     PHYSICAL EXAM Pleasant elderly African-American lady  not in distress. . Afebrile. Head is nontraumatic. Neck is supple without bruit.    Cardiac exam no murmur or gallop. Lungs are clear to auscultation. Distal pulses are well felt.  Neurological Exam :  Awake alert oriented 3. No aphasia, dysarthria or apraxia. Follows commands well. Speech is clear. Pupils 3 mm equal reactive. Extraocular movements are full range without nystagmus. Left partial homonymous hemianopsia. Fundi were not visualized. Face is symmetric without weakness tongue is midline. Motor system exam reveals no upper or lower extremity drift. Symmetric and equal strength in all 4 extremities. Sensation is intact. Reflexes are symmetric. Plantars are downgoing. Gait was not tested.    ASSESSMENT/PLAN Ms. [name] is a 74 y.o. female with history of hypertension, COPD, lung cancer, thyroid disease, and pernicious anemia presenting with bilateral left visual field deficits .She did not receive IV t-PA due to late presentation.  Strokes:  Non-dominant infarcts probably embolic possibly secondary to lung cancer.  Resultant bilateral left visual field deficits  MRI large nonhemorrhagic right PCA territory infarct -  Punctate nonhemorrhagic infarct in the medial right thalamus.  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   Carotid Doppler Bilateral: 1-39% ICA stenosis. Vertebral artery flow is antegrade.   2D Echo Left ventricle: The cavity size was normal. Wall thickness was increased in a pattern of mild LVH. There was focal basal hypertrophy. Systolic function was normal. The estimated ejection fraction was in the range of 50% to 55%. Wall motion was normal; there were no regional wall motion abnormalities.  Lower extremity Dopplers - negative for DVT  LDL 82  HgbA1c 5.2  Subcutaneous heparin for VTE prophylaxis Diet NPO time specified Except for: Sips with Meds  no antithrombotic prior to admission, now on aspirin 81 mg orally every  day  Patient counseled to be compliant with her antithrombotic medications  Ongoing aggressive stroke risk factor management  Therapy recommendations: No follow-up physical therapy recommended  Disposition: Pending  Hypertension  Home meds: No antihypertensives medications prior to admission  Stable   Hyperlipidemia  Home meds: No lipid lowering medications prior to admission  LDL 82, goal < 70  Add low-dose Lipitor 10 mg daily  Continue statin at discharge  Diabetes  HgbA1c 5.2 goal < 7.0  Controlled  Other Stroke Risk Factors  Advanced age  Cigarette smoker, quit smoking in 2008  Hx strokes by MRI  Lung cancer  Other Active Problems  Hypokalemia - supplementation ordered  Anemia   Other Pertinent History  The patient is scheduled for a chest CT at [facility] on Monday in preparation for a follow-up appointment with Dr. Earlie Server. Could be performed here if still an inpatient.   PLAN  TEE  Outpatient follow-up for cerebral aneurysm (incidental finding on MRA) Repeat MRA in 6 months   Hospital day # 2  I have personally examined this patient, reviewed notes, independently viewed imaging studies, participated in medical decision making and plan of care. I have made any additions or clarifications directly to the above note.  Check TEE and  ongoing stroke evaluation and aggressive risk factor modification. Continue aspirin for now.    Antony Contras, MD Medical Director [facility] Pager: [phone] 11/15/2014 1:05 PM     To contact Stroke Continuity provider, please refer to [email or web address] After hours, contact General Neurology

## 2014-11-18 ENCOUNTER — Other Ambulatory Visit: Payer: Self-pay | Admitting: Internal Medicine

## 2014-11-18 ENCOUNTER — Ambulatory Visit (HOSPITAL_COMMUNITY)
Admission: RE | Admit: 2014-11-18 | Discharge: 2014-11-18 | Disposition: A | Discharge status: Home / Self Care | Payer: PPO | Source: Ambulatory Visit | Attending: Internal Medicine | Admitting: Internal Medicine

## 2014-11-18 ENCOUNTER — Telehealth: Payer: Self-pay | Admitting: *Deleted

## 2014-11-18 ENCOUNTER — Encounter (HOSPITAL_COMMUNITY): Payer: Self-pay

## 2014-11-18 ENCOUNTER — Other Ambulatory Visit (HOSPITAL_BASED_OUTPATIENT_CLINIC_OR_DEPARTMENT_OTHER): Payer: PPO

## 2014-11-18 DIAGNOSIS — I708 Atherosclerosis of other arteries: Secondary | ICD-10-CM | POA: Diagnosis not present

## 2014-11-18 DIAGNOSIS — C3410 Malignant neoplasm of upper lobe, unspecified bronchus or lung: Secondary | ICD-10-CM | POA: Insufficient documentation

## 2014-11-18 DIAGNOSIS — I77819 Aortic ectasia, unspecified site: Secondary | ICD-10-CM | POA: Diagnosis not present

## 2014-11-18 DIAGNOSIS — K861 Other chronic pancreatitis: Secondary | ICD-10-CM | POA: Diagnosis not present

## 2014-11-18 DIAGNOSIS — C3411 Malignant neoplasm of upper lobe, right bronchus or lung: Secondary | ICD-10-CM | POA: Diagnosis not present

## 2014-11-18 DIAGNOSIS — I289 Disease of pulmonary vessels, unspecified: Secondary | ICD-10-CM | POA: Diagnosis not present

## 2014-11-18 DIAGNOSIS — I2510 Atherosclerotic heart disease of native coronary artery without angina pectoris: Secondary | ICD-10-CM | POA: Diagnosis not present

## 2014-11-18 DIAGNOSIS — Z08 Encounter for follow-up examination after completed treatment for malignant neoplasm: Secondary | ICD-10-CM | POA: Insufficient documentation

## 2014-11-18 LAB — CBC WITH DIFFERENTIAL/PLATELET
BASO%: 0.9 % (ref 0.0–2.0)
Basophils Absolute: 0 10*3/uL (ref 0.0–0.1)
EOS%: 6.9 % (ref 0.0–7.0)
Eosinophils Absolute: 0.3 10*3/uL (ref 0.0–0.5)
HCT: 28.1 % — ABNORMAL LOW (ref 34.8–46.6)
HGB: 9 g/dL — ABNORMAL LOW (ref 11.6–15.9)
LYMPH#: 1.2 10*3/uL (ref 0.9–3.3)
LYMPH%: 25.1 % (ref 14.0–49.7)
MCH: 27 pg (ref 25.1–34.0)
MCHC: 31.8 g/dL (ref 31.5–36.0)
MCV: 84.9 fL (ref 79.5–101.0)
MONO#: 0.4 10*3/uL (ref 0.1–0.9)
MONO%: 7.8 % (ref 0.0–14.0)
NEUT%: 59.3 % (ref 38.4–76.8)
NEUTROS ABS: 2.9 10*3/uL (ref 1.5–6.5)
Platelets: 290 10*3/uL (ref 145–400)
RBC: 3.31 10*6/uL — ABNORMAL LOW (ref 3.70–5.45)
RDW: 14 % (ref 11.2–14.5)
WBC: 4.8 10*3/uL (ref 3.9–10.3)

## 2014-11-18 LAB — COMPREHENSIVE METABOLIC PANEL (CC13)
ALT: 11 U/L (ref 0–55)
ANION GAP: 9 meq/L (ref 3–11)
AST: 17 U/L (ref 5–34)
Albumin: 3.5 g/dL (ref 3.5–5.0)
Alkaline Phosphatase: 125 U/L (ref 40–150)
BILIRUBIN TOTAL: 0.31 mg/dL (ref 0.20–1.20)
BUN: 16.3 mg/dL (ref 7.0–26.0)
CO2: 24 meq/L (ref 22–29)
CREATININE: 0.9 mg/dL (ref 0.6–1.1)
Calcium: 8.5 mg/dL (ref 8.4–10.4)
Chloride: 107 mEq/L (ref 98–109)
EGFR: 63 mL/min/{1.73_m2} — ABNORMAL LOW (ref >90)
Glucose: 100 mg/dl (ref 70–140)
Potassium: 4.2 mEq/L (ref 3.5–5.1)
Sodium: 140 mEq/L (ref 136–145)
TOTAL PROTEIN: 6.1 g/dL — AB (ref 6.4–8.3)

## 2014-11-18 MED ORDER — IOHEXOL 300 MG/ML  SOLN
100.0000 mL | Freq: Once | INTRAMUSCULAR | Status: AC | PRN
  Administered 2014-11-18: 100 mL via INTRAVENOUS

## 2014-11-18 NOTE — Telephone Encounter (Signed)
PT. [name] LATE Friday EVENING. SHE HAD HER CT SCAN DONE TODAY. DOES PT. RESTART HER TARCEVA NOW OR WAIT UNTIL SHE SEES DR.MOHAMED ON 11/25/14? PT. IS SCHEDULED TO SEE HER PRIMARY CARE PHYSICIAN ON 11/29/14.

## 2014-11-18 NOTE — Telephone Encounter (Signed)
THE REPORT WAS READ AND FAXED TO TRIAGE. NOTIFIED DR.MOHAMED'S NURSE, DIANE BELL,RN. THE REPORT WAS PLACED ON DR.MOHAMED'S DESK.

## 2014-11-18 NOTE — Telephone Encounter (Signed)
Per Julien Nordmann he said she can restart her tarceva.

## 2014-11-25 ENCOUNTER — Encounter: Payer: Self-pay | Admitting: *Deleted

## 2014-11-25 ENCOUNTER — Ambulatory Visit (HOSPITAL_BASED_OUTPATIENT_CLINIC_OR_DEPARTMENT_OTHER): Payer: PPO | Admitting: Internal Medicine

## 2014-11-25 ENCOUNTER — Telehealth: Payer: Self-pay | Admitting: Internal Medicine

## 2014-11-25 ENCOUNTER — Encounter: Payer: Self-pay | Admitting: Internal Medicine

## 2014-11-25 VITALS — BP 192/50 | HR 94 | Temp 98.3°F | Resp 18 | Ht 64.0 in | Wt 119.1 lb

## 2014-11-25 DIAGNOSIS — J449 Chronic obstructive pulmonary disease, unspecified: Secondary | ICD-10-CM

## 2014-11-25 DIAGNOSIS — C3411 Malignant neoplasm of upper lobe, right bronchus or lung: Secondary | ICD-10-CM

## 2014-11-25 DIAGNOSIS — C3410 Malignant neoplasm of upper lobe, unspecified bronchus or lung: Secondary | ICD-10-CM

## 2014-11-25 NOTE — Progress Notes (Signed)
Oncology Nurse Navigator Documentation  Oncology Nurse Navigator Flowsheets 11/25/2014  Navigator Encounter Type Other/spoke with patient at clinic today.  No change with oral treatment but will be evaluated by Rad Onc.for possible treatment.  Patient had recent stroke and now has some disease progression.  I will update CSW.    Patient Visit Type Follow-up  Treatment Phase Treatment  Barriers/Navigation Needs Treatment change  Interventions None required  Time Spent with Patient 15

## 2014-11-25 NOTE — Progress Notes (Signed)
[place] Telephone:([phone]) [phone]   Fax:([phone]) [phone]  OFFICE PROGRESS NOTE  Glo Herring., MD [place] [address]  DIAGNOSIS: Recurrent non-small cell lung cancer, adenocarcinoma with positive EGFR mutation in exon 21 ([id number]). The patient has multifocal disease including 2 nodules in the right lung with a suspicious tiny lesion in the left lung.  PRIOR THERAPY: None.  CURRENT THERAPY: Tarceva 150 mg by mouth daily status post 6 months of treatment.  INTERVAL HISTORY: [name] 74 y.o. female returns to the clinic today for follow-up visit accompanied by several family members. The patient is feeling fine today with no specific complaints except for shortness of breath. She is currently on home oxygen. She was recently diagnosed with a stroke with residual visual change. She has no residual weakness or fatigue in her upper or lower extremities The patient denied having any significant chest pain, cough or hemoptysis. She has no significant weight loss or night sweats. She is tolerating her treatment with oral Tarceva fairly well with no significant adverse effects except for mild diarrhea. She denied having any significant or diarrhea but only mild skin rash and dry skin. She had repeat CT scan of the chest, abdomen and pelvis performed recently and she is here for evaluation and discussion of her scan results.  MEDICAL HISTORY: Past Medical History  Diagnosis Date  . COPD (chronic obstructive pulmonary disease)   . Thyroid disease   . Pernicious anemia   . Hypertension   . Lung cancer     ALLERGIES:  is allergic to codeine; penicillins; and pravastatin sodium.  MEDICATIONS:  Current Outpatient Prescriptions  Medication Sig Dispense Refill  . acetaminophen (TYLENOL) 500 MG tablet Take 500-1,000 mg by mouth every 6 (six) hours as needed for moderate pain.     Marland Kitchen albuterol (PROVENTIL) (2.5 MG/3ML) 0.083% nebulizer solution Take 2.5 mg  by nebulization every 4 (four) hours. Or Four times daily    . ALPRAZolam (XANAX) 1 MG tablet Take 1 mg by mouth 4 (four) times daily as needed. For anxiety and/or sleep    . aspirin EC 81 MG EC tablet Take 1 tablet (81 mg total) by mouth daily.    Marland Kitchen atorvastatin (LIPITOR) 10 MG tablet Take 1 tablet (10 mg total) by mouth daily at 6 PM. 30 tablet 1  . clindamycin (CLEOCIN T) 1 % external solution Apply topically 2 (two) times daily. (Patient taking differently: Apply 1 application topically 2 (two) times daily as needed (acne breakouts). ) 30 mL 1  . Cyanocobalamin (VITAMIN B-12) 1000 MCG SUBL Place 1 tablet under the tongue daily.    . ferrous sulfate 325 (65 FE) MG tablet Take 325 mg by mouth 3 (three) times daily with meals.    . Fluticasone-Salmeterol (ADVAIR) 250-50 MCG/DOSE AEPB Inhale 1 puff into the lungs every 12 (twelve) hours.    Marland Kitchen guaiFENesin (MUCINEX) 600 MG 12 hr tablet Take 600 mg by mouth daily as needed for cough.     . levothyroxine (SYNTHROID, LEVOTHROID) 100 MCG tablet Take 100 mcg by mouth daily before breakfast.     . loperamide (IMODIUM) 2 MG capsule Take 2 mg by mouth as needed for diarrhea or loose stools.    Marland Kitchen loratadine (CLARITIN) 10 MG tablet Take 10 mg by mouth daily as needed for allergies.     . NON FORMULARY OXYGEN      24/7    . potassium chloride SA (K-DUR,KLOR-CON) 20 MEQ tablet  Take 1 tablet (20 mEq total) by mouth 2 (two) times daily. (Patient taking differently: Take 20 mEq by mouth 2 (two) times daily as needed (when taking a fluid pill). ) 14 tablet 0  . TARCEVA 150 MG tablet TAKE 1 TABLET BY MOUTH DAILY. TAKE ON AN EMPTY STOMACH 1 HOUR BEFORE MEALS OR 2 HOURS AFTER. 30 tablet 2  . torsemide (DEMADEX) 20 MG tablet Take 10 mg by mouth daily as needed (fluid retention).     . Vitamin D, Ergocalciferol, (DRISDOL) 50000 UNITS CAPS capsule Take 50,000 Units by mouth every 30 (thirty) days. Fridays     No current facility-administered medications for this visit.     SURGICAL HISTORY:  Past Surgical History  Procedure Laterality Date  . Abdominal hysterectomy    . Appendectomy    . Cholecystectomy    . Tubal ligation    . Chest tube insertion  03/08/2012    Procedure: CHEST TUBE INSERTION;  Surgeon: Ivin Poot, MD;  Location: [place];  Service: Thoracic;  Laterality: Right;  . Video assisted thoracoscopy  03/17/2012    Procedure: VIDEO ASSISTED THORACOSCOPY;  Surgeon: Ivin Poot, MD;  Location: [facility];  Service: Thoracic;  Laterality: Right;  . Resection of apical bleb  03/17/2012    Procedure: RESECTION OF APICAL BLEB;  Surgeon: Ivin Poot, MD;  Location: [facility];  Service: Thoracic;  Laterality: Right;  stapling of bleb  . Colonoscopy N/A 04/29/2014    Procedure: COLONOSCOPY;  Surgeon: Daneil Dolin, MD;  [facility];  Service: Endoscopy;  Laterality: N/A;  1245pm  . Esophagogastroduodenoscopy N/A 04/29/2014    Procedure: ESOPHAGOGASTRODUODENOSCOPY (EGD);  Surgeon: Daneil Dolin, MD;  [facility];  Service: Endoscopy;  Laterality: N/A;  . Savory dilation N/A 04/29/2014    Procedure: SAVORY DILATION;  Surgeon: Daneil Dolin, MD;  [facility];  Service: Endoscopy;  Laterality: N/A;  Venia Minks dilation N/A 04/29/2014    Procedure: Venia Minks DILATION;  Surgeon: Daneil Dolin, MD;  [facility];  Service: Endoscopy;  Laterality: N/A;  . Tee without cardioversion N/A 11/15/2014    Procedure: TRANSESOPHAGEAL ECHOCARDIOGRAM (TEE);  Surgeon: Sueanne Margarita, MD;  Location: [facility];  Service: Cardiovascular;  Laterality: N/A;    REVIEW OF SYSTEMS:  Constitutional: negative Eyes: negative Ears, nose, mouth, throat, and face: negative Respiratory: positive for dyspnea on exertion Cardiovascular: negative Gastrointestinal: negative Genitourinary:negative Integument/breast: negative Hematologic/lymphatic: negative Musculoskeletal:negative Neurological: negative Behavioral/Psych:  negative Endocrine: negative Allergic/Immunologic: negative   PHYSICAL EXAMINATION: General appearance: alert, cooperative, fatigued and no distress Head: Normocephalic, without obvious abnormality, atraumatic Neck: no adenopathy, no JVD, supple, symmetrical, trachea midline and thyroid not enlarged, symmetric, no tenderness/mass/nodules Lymph nodes: Cervical, supraclavicular, and axillary nodes normal. Resp: clear to auscultation bilaterally Back: symmetric, no curvature. ROM normal. No CVA tenderness. Cardio: regular rate and rhythm, S1, S2 normal, no murmur, click, rub or gallop GI: soft, non-tender; bowel sounds normal; no masses,  no organomegaly Extremities: extremities normal, atraumatic, no cyanosis or edema Neurologic: Alert and oriented X 3, normal strength and tone. Normal symmetric reflexes. Normal coordination and gait  ECOG PERFORMANCE STATUS: 1 - Symptomatic but completely ambulatory  Blood pressure 192/50, pulse 94, temperature 98.3 F (36.8 C), temperature source Oral, resp. rate 18, height 5' 4"  (1.626 m), weight 119 lb 1.6 oz (54.023 kg), SpO2 98 %.  LABORATORY DATA: Lab Results  Component Value Date   WBC 4.8 11/18/2014   HGB 9.0*  11/18/2014   HCT 28.1* 11/18/2014   MCV 84.9 11/18/2014   PLT 290 11/18/2014      Chemistry      Component Value Date/Time   NA 140 11/18/2014 0831   NA 140 11/13/2014 1744   K 4.2 11/18/2014 0831   K 3.1* 11/13/2014 1744   CL 103 11/13/2014 1744   CO2 24 11/18/2014 0831   CO2 26 11/13/2014 1736   BUN 16.3 11/18/2014 0831   BUN 16 11/13/2014 1744   CREATININE 0.9 11/18/2014 0831   CREATININE 1.00 11/13/2014 1744   CREATININE 1.13* 03/14/2013 1210      Component Value Date/Time   CALCIUM 8.5 11/18/2014 0831   CALCIUM 8.7* 11/13/2014 1736   ALKPHOS 125 11/18/2014 0831   ALKPHOS 118 11/13/2014 1736   AST 17 11/18/2014 0831   AST 17 11/13/2014 1736   ALT 11 11/18/2014 0831   ALT 11* 11/13/2014 1736   BILITOT 0.31  11/18/2014 0831   BILITOT 0.7 11/13/2014 1736       RADIOGRAPHIC STUDIES: Ct Head Wo Contrast  11/13/2014   CLINICAL DATA:  Headache with left-sided visual field blindness starting on Monday. Possible stroke. History lung cancer.  EXAM: CT HEAD WITHOUT CONTRAST  TECHNIQUE: Contiguous axial images were obtained from the base of the skull through the vertex without intravenous contrast.  COMPARISON:  MRI of 05/31/2014.  Head CT of 03/16/2012.  FINDINGS: Sinuses/Soft tissues: Clear paranasal sinuses and mastoid air cells.  Intracranial: Cerebral and cerebellar atrophy. Left cerebellar hemisphere low-density focus is chronic and likely related to remote lacunar infarct. Right caudate head remote lacunar infarct as well.  Right medial occipital lobe cortically based hypoattenuation is consistent with subacute infarct. No significant mass effect or midline shift. No hemorrhage, hydrocephalus, intra-axial, or extra-axial fluid collection.  IMPRESSION: 1. Cortically based infarct involving the right PCA distribution. Likely subacute, given absence of significant mass effect. 2. Cerebral/cerebellar atrophy with remote scattered lacunar infarcts. These results were called by telephone at the time of interpretation on 11/13/2014 at 5:29 pm to Dr. Deno Etienne , who verbally acknowledged these results.   Electronically Signed   By: Abigail Miyamoto M.D.   On: 11/13/2014 17:30   Ct Chest W Contrast  11/18/2014   CLINICAL DATA:  Right-sided lung cancer diagnosed in 2013. Right-sided partial lung resection. Hysterectomy. Appendectomy. Shortness of breath. History of COPD. No current abdominal pelvic complaints.  EXAM: CT CHEST, ABDOMEN, AND PELVIS WITH CONTRAST  TECHNIQUE: Multidetector CT imaging of the chest, abdomen and pelvis was performed following the standard protocol during bolus administration of intravenous contrast.  CONTRAST:  117m OMNIPAQUE IOHEXOL 300 MG/ML  SOLN  COMPARISON:  08/20/2014  FINDINGS: CT CHEST  FINDINGS  Mediastinum/Nodes: No supraclavicular adenopathy. Advanced aortic and branch vessel atherosclerosis. Normal heart size, without pericardial effusion. Multivessel coronary artery atherosclerosis. No central pulmonary embolism, on this non-dedicated study. Pulmonary outflow tract is enlarged, 3.6 cm. 9 mm precarinal node is not pathologic by size criteria and similar. No hilar adenopathy.  Lungs/Pleura: Moderate to marked centrilobular emphysema.  Posterior right upper lobe masslike soft tissue density measures 2.7 x 3.1 cm on image 10 of series 5. 2.4 x 2.5 cm at the same level on the prior. On sagittal image 43, measures 1.9 cm craniocaudal today versus 1.6 cm on the prior.  The previously described pleural-based 1.6 cm right upper lobe nodular density is again identified on image 9. increased soft tissue density between these 2 areas along the posterior right pleural surface  on image 11 of series 2.  Subpleural left lower lobe 3 mm nodule on image 23 is unchanged.  Presumed radiation change in the posterior right upper lobe on image 14.  Musculoskeletal: subtle cortical irregularity involving the anterior right side of the T3 vertebral body, at the site of increased soft tissue density about the posterior right upper lobe and adjacent right pleural space. Example transverse image 13, coronal image 61, and sagittal image 52.  CT ABDOMEN AND PELVIS FINDINGS  Hepatobiliary: Focal steatosis adjacent the falciform ligament. Cholecystectomy, without biliary ductal dilatation.  Pancreas: Findings of chronic calcific pancreatitis again identified. Pancreatic atrophy with parenchymal calcifications. A cystic focus within the uncinate process measures 2.1 x 1.7 cm on image 54 of series 2. contiguous cystic lesion within the dorsal pancreatic body measures 1.2 x 1.3 cm on image 50 of series 2 and is new since the prior. Improved to resolved peripancreatic inflammation.  Spleen: Normal  Adrenals/Urinary Tract: Normal  adrenal glands. Too small to characterize bilateral renal lesions. No hydronephrosis. Normal urinary bladder.  Stomach/Bowel: Normal stomach, without wall thickening. Cecum extending into the right central pelvis. Colon Otherwise unremarkable. Normal small bowel.  Vascular/Lymphatic: Advanced aortic and branch vessel atherosclerosis. Infrarenal abdominal aortic ectasia at maximally 3.8 x 3.7 cm. 3.7 x 3.7 cm on the prior. No surrounding hemorrhage. No extension into the common iliacs. 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 No abdominopelvic adenopathy.  Reproductive: Hysterectomy.  No adnexal mass.  Other: No significant free fluid.  Musculoskeletal: Mild osteopenia.  No acute osseous abnormality.  IMPRESSION: CT CHEST IMPRESSION  1. Subtle increased soft tissue density about the posterior right upper lobe and adjacent right pleural space. This is suspicious for disease progression. concurrent osseous irregularity involving the T3 vertebral body is suspicious for direct extension. Consider further evaluation with PET and possibly pre and post contrast thoracic spine MRI. These results will be called to the ordering clinician or representative by the Radiologist Assistant, and communication documented in the PACS or zVision Dashboard. 2.  Atherosclerosis, including within the coronary arteries. 3. Pulmonary artery enlargement suggests pulmonary arterial hypertension. 4. Stable precarinal node, not pathologic by size criteria.  CT ABDOMEN AND PELVIS IMPRESSION  1. No evidence of metastatic disease within the abdomen or pelvis. 2. Chronic calcific pancreatitis. Although the presumed pseudocyst in the uncinate process is similar to decreased in size, there is new direct extension dorsal to the pancreatic body. Improved to resolved surrounding inflammation 3. similar infrarenal aortic.Ectasia ; chronic left common iliac artery severe stenosis to occlusion.   Electronically Signed   By: Abigail Miyamoto  M.D.   On: 11/18/2014 11:07   Mr Jodene Nam Head Wo Contrast  11/13/2014   CLINICAL DATA:  Left-sided visual field defect.  Acute infarct.  EXAM: MRI HEAD WITHOUT CONTRAST  MRA HEAD WITHOUT CONTRAST  TECHNIQUE: Multiplanar, multiecho pulse sequences of the brain and surrounding structures were obtained without intravenous contrast. Angiographic images of the head were obtained using MRA technique without contrast.  COMPARISON:  CT head without contrast from the same day. MRI brain 05/31/2014.  FINDINGS: MRI HEAD FINDINGS  The diffusion-weighted images confirm an acute nonhemorrhagic right occipital lobe infarct. There is also a punctate infarct in the medial right thalamus. Extensive T2 changes are associated with the areas of acute infarct. Remote lacunar infarcts are present in the right caudate head and bilateral thalami. There is remote lacunar infarct in the right lentiform nucleus. Mild periventricular and subcortical T2 changes bilaterally  likely reflect the sequela of chronic microvascular ischemia.  No acute hemorrhage or mass lesion is present. The ventricles are proportionate to the degree of atrophy. No significant extra-axial fluid collection is present.  Flow present in the major intracranial arteries. The globes orbits are intact. The paranasal sinuses and mastoid air cells are clear.  Skullbase is within normal limits. Midline structures are unremarkable.  MRA HEAD FINDINGS  Mild atherosclerotic changes are noted in the ophthalmic segment of the internal carotid arteries bilaterally. A small infundibulum is noted at posterior communicating artery bilaterally. Moderate proximal stenosis is noted at the right A1 segment. The left A1 segment is normal. 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 The MCA bifurcations are intact. There is mild attenuation of distal MCA branch vessels.  The left vertebral artery is slightly dominant to the  right. The PICA origins are visualized and normal. The basilar artery is within normal limits. Both posterior cerebral arteries originate from the basilar tip. Small posterior communicating arteries are present. The PCA branch vessels demonstrate flow bilaterally.  IMPRESSION: 1. Confirmation of large nonhemorrhagic right PCA territory infarct 2. Punctate nonhemorrhagic infarct in the medial right thalamus. 3. Additional remote lacunar infarcts are present in the basal ganglia bilaterally. 4. Age advanced atrophy and white matter disease. This likely reflects the sequela of chronic microvascular ischemia. 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 6. Moderate stenosis of the proximal right A1 segment. These results were called by telephone at the time of interpretation on 11/13/2014 at 7:51 pm to Dr. Deno Etienne, who verbally acknowledged these results.   Electronically Signed   By: San Morelle M.D.   On: 11/13/2014 19:51   Mr Brain Wo Contrast  11/13/2014   CLINICAL DATA:  Left-sided visual field defect.  Acute infarct.  EXAM: MRI HEAD WITHOUT CONTRAST  MRA HEAD WITHOUT CONTRAST  TECHNIQUE: Multiplanar, multiecho pulse sequences of the brain and surrounding structures were obtained without intravenous contrast. Angiographic images of the head were obtained using MRA technique without contrast.  COMPARISON:  CT head without contrast from the same day. MRI brain 05/31/2014.  FINDINGS: MRI HEAD FINDINGS  The diffusion-weighted images confirm an acute nonhemorrhagic right occipital lobe infarct. There is also a punctate infarct in the medial right thalamus. Extensive T2 changes are associated with the areas of acute infarct. Remote lacunar infarcts are present in the right caudate head and bilateral thalami. There is remote lacunar infarct in the right lentiform nucleus. Mild periventricular and subcortical T2 changes bilaterally likely reflect the sequela of chronic microvascular ischemia.  No acute  hemorrhage or mass lesion is present. The ventricles are proportionate to the degree of atrophy. No significant extra-axial fluid collection is present.  Flow present in the major intracranial arteries. The globes orbits are intact. The paranasal sinuses and mastoid air cells are clear.  Skullbase is within normal limits. Midline structures are unremarkable.  MRA HEAD FINDINGS  Mild atherosclerotic changes are noted in the ophthalmic segment of the internal carotid arteries bilaterally. A small infundibulum is noted at posterior communicating artery bilaterally. Moderate proximal stenosis is noted at the right A1 segment. The left A1 segment is normal. 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 The MCA bifurcations are intact. There is mild attenuation of distal MCA branch vessels.  The left vertebral artery is slightly dominant to the right. The PICA origins are  visualized and normal. The basilar artery is within normal limits. Both posterior cerebral arteries originate from the basilar tip. Small posterior communicating arteries are present. The PCA branch vessels demonstrate flow bilaterally.  IMPRESSION: 1. Confirmation of large nonhemorrhagic right PCA territory infarct 2. Punctate nonhemorrhagic infarct in the medial right thalamus. 3. Additional remote lacunar infarcts are present in the basal ganglia bilaterally. 4. Age advanced atrophy and white matter disease. This likely reflects the sequela of chronic microvascular ischemia. 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 6. Moderate stenosis of the proximal right A1 segment. These results were called by telephone at the time of interpretation on 11/13/2014 at 7:51 pm to Dr. Deno Etienne, who verbally acknowledged these results.   Electronically Signed   By: San Morelle M.D.   On: 11/13/2014 19:51   Ct Abdomen Pelvis W Contrast  11/18/2014   CLINICAL DATA:  Right-sided  lung cancer diagnosed in 2013. Right-sided partial lung resection. Hysterectomy. Appendectomy. Shortness of breath. History of COPD. No current abdominal pelvic complaints.  EXAM: CT CHEST, ABDOMEN, AND PELVIS WITH CONTRAST  TECHNIQUE: Multidetector CT imaging of the chest, abdomen and pelvis was performed following the standard protocol during bolus administration of intravenous contrast.  CONTRAST:  11m OMNIPAQUE IOHEXOL 300 MG/ML  SOLN  COMPARISON:  08/20/2014  FINDINGS: CT CHEST FINDINGS  Mediastinum/Nodes: No supraclavicular adenopathy. Advanced aortic and branch vessel atherosclerosis. Normal heart size, without pericardial effusion. Multivessel coronary artery atherosclerosis. No central pulmonary embolism, on this non-dedicated study. Pulmonary outflow tract is enlarged, 3.6 cm. 9 mm precarinal node is not pathologic by size criteria and similar. No hilar adenopathy.  Lungs/Pleura: Moderate to marked centrilobular emphysema.  Posterior right upper lobe masslike soft tissue density measures 2.7 x 3.1 cm on image 10 of series 5. 2.4 x 2.5 cm at the same level on the prior. On sagittal image 43, measures 1.9 cm craniocaudal today versus 1.6 cm on the prior.  The previously described pleural-based 1.6 cm right upper lobe nodular density is again identified on image 9. increased soft tissue density between these 2 areas along the posterior right pleural surface on image 11 of series 2.  Subpleural left lower lobe 3 mm nodule on image 23 is unchanged.  Presumed radiation change in the posterior right upper lobe on image 14.  Musculoskeletal: subtle cortical irregularity involving the anterior right side of the T3 vertebral body, at the site of increased soft tissue density about the posterior right upper lobe and adjacent right pleural space. Example transverse image 13, coronal image 61, and sagittal image 52.  CT ABDOMEN AND PELVIS FINDINGS  Hepatobiliary: Focal steatosis adjacent the falciform ligament.  Cholecystectomy, without biliary ductal dilatation.  Pancreas: Findings of chronic calcific pancreatitis again identified. Pancreatic atrophy with parenchymal calcifications. A cystic focus within the uncinate process measures 2.1 x 1.7 cm on image 54 of series 2. contiguous cystic lesion within the dorsal pancreatic body measures 1.2 x 1.3 cm on image 50 of series 2 and is new since the prior. Improved to resolved peripancreatic inflammation.  Spleen: Normal  Adrenals/Urinary Tract: Normal adrenal glands. Too small to characterize bilateral renal lesions. No hydronephrosis. Normal urinary bladder.  Stomach/Bowel: Normal stomach, without wall thickening. Cecum extending into the right central pelvis. Colon Otherwise unremarkable. Normal small bowel.  Vascular/Lymphatic: Advanced aortic and branch vessel atherosclerosis. Infrarenal abdominal aortic ectasia at maximally 3.8 x 3.7 cm. 3.7 x 3.7 cm on the prior. No surrounding hemorrhage. No extension into the common iliacs. Severe stenosis  to occlusion of the left common iliac artery. No abdominopelvic adenopathy.  Reproductive: Hysterectomy.  No adnexal mass.  Other: No significant free fluid.  Musculoskeletal: Mild osteopenia.  No acute osseous abnormality.  IMPRESSION: CT CHEST IMPRESSION  1. Subtle increased soft tissue density about the posterior right upper lobe and adjacent right pleural space. This is suspicious for disease progression. concurrent osseous irregularity involving the T3 vertebral body is suspicious for direct extension. Consider further evaluation with PET and possibly pre and post contrast thoracic spine MRI. These results will be called to the ordering clinician or representative by the Radiologist Assistant, and communication documented in the PACS or zVision Dashboard. 2.  Atherosclerosis, including within the coronary arteries. 3. Pulmonary artery enlargement suggests pulmonary arterial hypertension. 4. Stable precarinal node, not pathologic  by size criteria.  CT ABDOMEN AND PELVIS IMPRESSION  1. No evidence of metastatic disease within the abdomen or pelvis. 2. Chronic calcific pancreatitis. Although the presumed pseudocyst in the uncinate process is similar to decreased in size, there is new direct extension dorsal to the pancreatic body. Improved to resolved surrounding inflammation 3. similar infrarenal aortic.Ectasia ; chronic left common iliac artery severe stenosis to occlusion.   Electronically Signed   By: Abigail Miyamoto M.D.   On: 11/18/2014 11:07    ASSESSMENT AND PLAN: This is a very pleasant 74 years old with metastatic non-small cell lung cancer, adenocarcinoma with positive EGFR mutation who is currently undergoing treatment with Tarceva 150 mg by mouth daily status post 6 months of treatment. The patient is tolerating her treatment fairly well with no significant adverse effects. The recent CT scan of the chest, abdomen and pelvis showed no evidence for disease progression except for increased size of the soft tissue density in the posterior right upper lobe and adjacent right pleural space. I discussed the scan results and showed the images to the patient and her family. I recommended for her to see radiation oncology for consideration of palliative radiotherapy to the enlarging soft tissue mass. I recommended for her to continue her treatment with the same dose of Tarceva 150 mg by mouth daily. For the heartburn, The patient will continue to use Zantac or Pepcid but at least 2 hours after her dose of Tarceva. The patient would come back for follow-up visit in one month's for reevaluation with repeat blood work. She was advised to call immediately if she has any concerning symptoms in the interval. The patient voices understanding of current disease status and treatment options and is in agreement with the current care plan.  All questions were answered. The patient knows to call the clinic with any problems, questions or  concerns. We can certainly see the patient much sooner if necessary.  Disclaimer: This note was dictated with voice recognition software. Similar sounding words can inadvertently be transcribed and may not be corrected upon review.

## 2014-11-25 NOTE — Telephone Encounter (Signed)
Pt confirmed labs/ov per 08/22 POF, gave pt avs and calendar.... KJ, sent msg to KD for referral for radiation.Marland KitchenMarland Kitchen

## 2014-12-04 NOTE — Progress Notes (Signed)
Thoracic Location of Tumor / Histology: upper-outer lung  Patient presented months ago with symptoms of: 03/17/12. Surgery collapse lung.No chemo or radiation.  Biopsies of 05/06/2014 FINAL DIAGNOSIS Diagnosis Lung, needle/core biopsy(ies), Right upper lung - POSITIVE FOR ADENOCARCINOMA. - SEE COMMENT.  Tobacco/Marijuana/Snuff/ETOH use: Quit smoking in 2008  Past/Anticipated interventions by cardiothoracic surgery, if any: 03/17/12  VIDEO ASSISTED THORACOSCOPY (Right Chest)   RESECTION OF APICAL BLEB (Right )        Past/Anticipated interventions by medical oncology, if [id number] on tarceva  In February 2016 by dr.Mohamed.  Signs/Symptoms  Weight changes, if any: None  Respiratory complaints, if any: COPD.On 2 liters of oxygen.  Hemoptysis, if any:No  Pain issues, if any: No  SAFETY ISSUES:  Prior radiation? No  Pacemaker/ICD? No  Possible current pregnancy?No  Is the patient on methotrexate? 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  Allergies:dodeine, penicillin, pravastatin

## 2014-12-05 ENCOUNTER — Ambulatory Visit
Admission: RE | Admit: 2014-12-05 | Discharge: 2014-12-05 | Disposition: A | Discharge status: Home / Self Care | Payer: PPO | Source: Ambulatory Visit | Attending: Radiation Oncology | Admitting: Radiation Oncology

## 2014-12-05 ENCOUNTER — Encounter: Payer: Self-pay | Admitting: Radiation Oncology

## 2014-12-05 VITALS — BP 187/75 | HR 95 | Temp 98.3°F | Wt 117.5 lb

## 2014-12-05 DIAGNOSIS — Z8673 Personal history of transient ischemic attack (TIA), and cerebral infarction without residual deficits: Secondary | ICD-10-CM | POA: Diagnosis not present

## 2014-12-05 DIAGNOSIS — C3410 Malignant neoplasm of upper lobe, unspecified bronchus or lung: Secondary | ICD-10-CM | POA: Insufficient documentation

## 2014-12-05 DIAGNOSIS — Z510 Encounter for antineoplastic radiation therapy: Secondary | ICD-10-CM | POA: Diagnosis not present

## 2014-12-05 DIAGNOSIS — C3412 Malignant neoplasm of upper lobe, left bronchus or lung: Secondary | ICD-10-CM

## 2014-12-05 NOTE — Progress Notes (Signed)
  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 female who is diagnosed with metastatic adenocarcinoma of the right lung. She was diagnosed in February of this year. A PET scan on 05/29/14 revealed a 3.1 cm mass in the right upper lobe with 2 separate hypermetabolic nodules also in the right upper lobe. No evidence of metastasis to the lymph nodes or rest of the body was noted. She does have a history of a VATS procedure in December 2013. MRI of the brain was negative for metastatic disease. She is taking Tarceva 150 mg and is doing well. She is seen on recent imaging to have an increase in size of the right upper lobe mass. There is suspicion of invasion of the T3 vertebral body. No evidence of metastatic disease is noted elsewhere. She was referred for the consideration of radiation to this local area. She will otherwise continue taking the Tarceva. She is on 2 liters nasal cannula oxygen chronically. She had a stroke in August which she has recovered from, but still has a field of vision deficit.   Pt reports she intermittent diarrhea, but takes Imodium for it. Pt also reports some hair loss and mid-back pain. She denies hemoptysis.  PREVIOUS RADIATION THERAPY: No  Past medical, social and family history were reviewed in the electronic chart. Review of symptoms was reviewed in the electronic chart. Medications were reviewed in the electronic chart.   PHYSICAL EXAM:  Filed Vitals:   12/05/14 1047  BP: 187/75  Pulse: 95  Temp: 98.3 F (36.8 C)  .117 lb 8 oz (53.298 kg). Alert and oriented times three. In no distress. Nasal cannula in place with 2 L of oxygen. Mild pink rash over her face,  arms, and chest.  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 We discussed that she would receive 10 treatments as an outpatient. We also discussed possible side effects during the radiation treatment that include, but are not limited to fatigue and skin redness. I think we could do the treatment with minimal effect on her breathing, which was her main concern. The patient would like some time to think about radiation, but is leaning towards treatment. I gave her our scheduler's contact information if she ever decides to proceed with radiation treatment. She is scheduled to see Dr. Julien Nordmann on 12/26/14.  I spent 60 minutes  face to face with the patient and more than 50% of that time was spent in counseling and/or coordination of care.   This document serves as a record of services personally performed by Thea Silversmith, MD. It was created on her behalf by Darcus Austin, a trained medical scribe. The creation of this record is based on the scribe's personal observations and the provider's statements to them. This document has been checked and approved by the attending provider.   ------------------------------------------------  Thea Silversmith, MD

## 2014-12-05 NOTE — Progress Notes (Signed)
Please see the Nurse Progress Note in the MD Initial Consult Encounter for this patient. 

## 2014-12-11 ENCOUNTER — Ambulatory Visit
Admission: RE | Admit: 2014-12-11 | Discharge: 2014-12-11 | Disposition: A | Discharge status: Home / Self Care | Payer: PPO | Source: Ambulatory Visit | Attending: Radiation Oncology | Admitting: Radiation Oncology

## 2014-12-11 DIAGNOSIS — Z510 Encounter for antineoplastic radiation therapy: Secondary | ICD-10-CM | POA: Diagnosis not present

## 2014-12-11 DIAGNOSIS — C3412 Malignant neoplasm of upper lobe, left bronchus or lung: Secondary | ICD-10-CM

## 2014-12-11 NOTE — Progress Notes (Signed)
[facility] Simulation and Treatment Planning Note   Name: NYHLA MOUNTJOY MRN: [id number]  Date: 12/11/2014  DOB: Nov 07, 1940  Status: outpatient    DIAGNOSIS: Lung cancer, upper lobe   Staging form: Lung, AJCC 7th Edition     Clinical stage from 05/16/2014: Stage IV (T3, N0, M1a) - Signed by Curt Bears, MD on 05/16/2014     CONSENT VERIFIED: yes   SET UP AND IMMOBILIZATION: Patient is setup supine with arms in a wing board.   NARRATIVE: The patient was brought to the [facility].  Identity was confirmed.  All relevant records and images related to the planned course of therapy were reviewed.  Then, the patient was positioned in a stable reproducible clinical set-up for radiation therapy.  CT images were obtained.  Skin markings were placed.  The CT images were loaded into the planning software where the target and avoidance structures were contoured.  The radiation prescription was entered and confirmed.   TREATMENT PLANNING NOTE:  Treatment planning then occurred. I have requested 3D simulation with [facility] of the spinal cord, total lungs and gross tumor volume. I have also requested mlcs and an isodose plan.   This document serves as a record of services personally performed by Thea Silversmith, MD. It was created on her behalf by Darcus Austin, a trained medical scribe. The creation of this record is based on the scribe's personal observations and the provider's statements to them. This document has been checked and approved by the attending provider.

## 2014-12-17 ENCOUNTER — Telehealth: Payer: Self-pay | Admitting: Medical Oncology

## 2014-12-17 DIAGNOSIS — Z510 Encounter for antineoplastic radiation therapy: Secondary | ICD-10-CM | POA: Diagnosis not present

## 2014-12-17 NOTE — Telephone Encounter (Signed)
Daughter asking if pt should postpone radiation appts. I told daughter for pt to keep her xrt appt.

## 2014-12-17 NOTE — Telephone Encounter (Signed)
Yesterday fever started at 103 -took tylenol then temp down to 102. Today it was 100.3 F. Pt reports runny nose , congested, with clear nasal drainage. She has a poor appetite and is drinking fluids, but is weak. Per Julien Nordmann I instructed daughter to contact PCP .

## 2014-12-18 ENCOUNTER — Ambulatory Visit: Payer: PPO | Admitting: Radiation Oncology

## 2014-12-18 ENCOUNTER — Telehealth: Payer: Self-pay | Admitting: Medical Oncology

## 2014-12-18 ENCOUNTER — Ambulatory Visit
Admission: RE | Admit: 2014-12-18 | Discharge: 2014-12-18 | Disposition: A | Discharge status: Home / Self Care | Payer: PPO | Source: Ambulatory Visit | Attending: Radiation Oncology | Admitting: Radiation Oncology

## 2014-12-18 DIAGNOSIS — Z510 Encounter for antineoplastic radiation therapy: Secondary | ICD-10-CM | POA: Diagnosis not present

## 2014-12-18 NOTE — Telephone Encounter (Signed)
Daughter is waiting to hear back from Dr Nevin Bloodgood office re her mother.XRT r/s for tomorrow.

## 2014-12-19 ENCOUNTER — Ambulatory Visit: Payer: PPO

## 2014-12-20 ENCOUNTER — Ambulatory Visit: Payer: PPO

## 2014-12-20 ENCOUNTER — Ambulatory Visit
Admission: RE | Admit: 2014-12-20 | Discharge: 2014-12-20 | Disposition: A | Discharge status: Home / Self Care | Payer: PPO | Source: Ambulatory Visit | Attending: Radiation Oncology | Admitting: Radiation Oncology

## 2014-12-20 ENCOUNTER — Ambulatory Visit: Payer: PPO | Admitting: Radiation Oncology

## 2014-12-23 ENCOUNTER — Ambulatory Visit: Payer: PPO

## 2014-12-23 ENCOUNTER — Encounter: Payer: Self-pay | Admitting: Radiation Oncology

## 2014-12-23 ENCOUNTER — Ambulatory Visit
Admission: RE | Admit: 2014-12-23 | Discharge: 2014-12-23 | Disposition: A | Discharge status: Home / Self Care | Payer: PPO | Source: Ambulatory Visit | Attending: Radiation Oncology | Admitting: Radiation Oncology

## 2014-12-23 DIAGNOSIS — Z510 Encounter for antineoplastic radiation therapy: Secondary | ICD-10-CM | POA: Diagnosis not present

## 2014-12-23 NOTE — Progress Notes (Addendum)
Patient came to nursing hoping to see [name] , stated " [name] was going to listen to me  Before I start radiation to make sure I don't have bronchitis or pneumonia" per daughter, took vitals and listened to patient lungs, diminished in the back , no rales, crackles or wheezing heard, patient started on Doxyclycline this past Thursday, coughs up yellowish phelgm at times, drinking more fluids"I'm trying to", stated patient on 3 liters n./c oxygen, 100% sats, vitals okay, will start radiation tomorrow, knows to call for fever greater than 100.5  12:50 PM BP 140/62 mmHg  Pulse 85  Temp(Src) 98.4 F (36.9 C) (Oral)  Resp 20  SpO2 100%

## 2014-12-24 ENCOUNTER — Inpatient Hospital Stay
Admission: RE | Admit: 2014-12-24 | Discharge: 2014-12-24 | Disposition: A | Discharge status: Home / Self Care | Payer: PPO | Source: Ambulatory Visit | Attending: Radiation Oncology | Admitting: Radiation Oncology

## 2014-12-24 ENCOUNTER — Ambulatory Visit
Admission: RE | Admit: 2014-12-24 | Discharge: 2014-12-24 | Disposition: A | Discharge status: Home / Self Care | Payer: PPO | Source: Ambulatory Visit | Attending: Radiation Oncology | Admitting: Radiation Oncology

## 2014-12-24 ENCOUNTER — Ambulatory Visit: Payer: PPO

## 2014-12-24 VITALS — BP 137/57 | HR 83 | Temp 98.5°F | Wt 116.6 lb

## 2014-12-24 DIAGNOSIS — C3412 Malignant neoplasm of upper lobe, left bronchus or lung: Secondary | ICD-10-CM

## 2014-12-24 DIAGNOSIS — Z510 Encounter for antineoplastic radiation therapy: Secondary | ICD-10-CM | POA: Diagnosis not present

## 2014-12-24 MED ORDER — RADIAPLEXRX EX GEL
Freq: Once | CUTANEOUS | Status: AC
  Administered 2014-12-24: 13:00:00 via TOPICAL

## 2014-12-24 NOTE — Progress Notes (Signed)
BP 137/57 mmHg  Pulse 83  Temp(Src) 98.5 F (36.9 C)  Wt 116 lb 9.6 oz (52.889 kg)  SpO2 99%  Routine of clinic reviewed with patient and daughter..See doctor every Tuesday after treatment.given Radiation Therapy and You booklet and radiaplex.Informed of possible side effects mainly fatigue and possible skin discoloration as patient only receiving 10 treatments to the chest.denies pain.shortness of breath on going, greater with exertion.Wears 3 liters oxygen.

## 2014-12-24 NOTE — Progress Notes (Signed)
Weekly Management Note Current Dose:3 Gy  Projected Dose:30 Gy   Narrative:  The patient presents for routine under treatment assessment.  CBCT/MVCT images/Port film x-rays were reviewed.  The chart was checked. On ABX for presumed pneumonia.   Physical Findings:  Unchanged  Vitals: There were no vitals filed for this visit. Weight:  Wt Readings from Last 3 Encounters:  12/24/14 116 lb 9.6 oz (52.889 kg)  12/05/14 117 lb 8 oz (53.298 kg)  11/25/14 119 lb 1.6 oz (54.023 kg)   Lab Results  Component Value Date   WBC 4.8 11/18/2014   HGB 9.0* 11/18/2014   HCT 28.1* 11/18/2014   MCV 84.9 11/18/2014   PLT 290 11/18/2014   Lab Results  Component Value Date   CREATININE 0.9 11/18/2014   BUN 16.3 11/18/2014   NA 140 11/18/2014   K 4.2 11/18/2014   CL 103 11/13/2014   CO2 24 11/18/2014     Impression:  The patient is tolerating radiation.  Plan:  Continue treatment as planned. RN education performed.

## 2014-12-25 ENCOUNTER — Ambulatory Visit
Admission: RE | Admit: 2014-12-25 | Discharge: 2014-12-25 | Disposition: A | Discharge status: Home / Self Care | Payer: PPO | Source: Ambulatory Visit | Attending: Radiation Oncology | Admitting: Radiation Oncology

## 2014-12-25 DIAGNOSIS — Z510 Encounter for antineoplastic radiation therapy: Secondary | ICD-10-CM | POA: Diagnosis not present

## 2014-12-26 ENCOUNTER — Telehealth: Payer: Self-pay | Admitting: Internal Medicine

## 2014-12-26 ENCOUNTER — Ambulatory Visit (HOSPITAL_BASED_OUTPATIENT_CLINIC_OR_DEPARTMENT_OTHER): Payer: PPO | Admitting: Internal Medicine

## 2014-12-26 ENCOUNTER — Ambulatory Visit
Admission: RE | Admit: 2014-12-26 | Discharge: 2014-12-26 | Disposition: A | Discharge status: Home / Self Care | Payer: PPO | Source: Ambulatory Visit | Attending: Radiation Oncology | Admitting: Radiation Oncology

## 2014-12-26 ENCOUNTER — Encounter: Payer: Self-pay | Admitting: Internal Medicine

## 2014-12-26 ENCOUNTER — Other Ambulatory Visit (HOSPITAL_BASED_OUTPATIENT_CLINIC_OR_DEPARTMENT_OTHER): Payer: PPO

## 2014-12-26 VITALS — BP 127/79 | HR 93 | Temp 98.4°F | Resp 19 | Ht 64.0 in | Wt 114.9 lb

## 2014-12-26 DIAGNOSIS — C3411 Malignant neoplasm of upper lobe, right bronchus or lung: Secondary | ICD-10-CM

## 2014-12-26 DIAGNOSIS — J449 Chronic obstructive pulmonary disease, unspecified: Secondary | ICD-10-CM

## 2014-12-26 DIAGNOSIS — Z510 Encounter for antineoplastic radiation therapy: Secondary | ICD-10-CM | POA: Diagnosis not present

## 2014-12-26 DIAGNOSIS — C3410 Malignant neoplasm of upper lobe, unspecified bronchus or lung: Secondary | ICD-10-CM

## 2014-12-26 LAB — COMPREHENSIVE METABOLIC PANEL (CC13)
ALT: 15 U/L (ref 0–55)
ANION GAP: 9 meq/L (ref 3–11)
AST: 21 U/L (ref 5–34)
Albumin: 2.1 g/dL — ABNORMAL LOW (ref 3.5–5.0)
Alkaline Phosphatase: 215 U/L — ABNORMAL HIGH (ref 40–150)
BUN: 15.1 mg/dL (ref 7.0–26.0)
CALCIUM: 8.3 mg/dL — AB (ref 8.4–10.4)
CO2: 29 meq/L (ref 22–29)
CREATININE: 0.9 mg/dL (ref 0.6–1.1)
Chloride: 101 mEq/L (ref 98–109)
EGFR: 62 mL/min/{1.73_m2} — ABNORMAL LOW (ref >90)
Glucose: 106 mg/dl (ref 70–140)
Potassium: 3.4 mEq/L — ABNORMAL LOW (ref 3.5–5.1)
Sodium: 139 mEq/L (ref 136–145)
TOTAL PROTEIN: 5.4 g/dL — AB (ref 6.4–8.3)
Total Bilirubin: 0.84 mg/dL (ref 0.20–1.20)

## 2014-12-26 LAB — CBC WITH DIFFERENTIAL/PLATELET
BASO%: 0.8 % (ref 0.0–2.0)
Basophils Absolute: 0.1 10*3/uL (ref 0.0–0.1)
EOS%: 1.9 % (ref 0.0–7.0)
Eosinophils Absolute: 0.2 10*3/uL (ref 0.0–0.5)
HEMATOCRIT: 25.8 % — AB (ref 34.8–46.6)
HGB: 8.3 g/dL — ABNORMAL LOW (ref 11.6–15.9)
LYMPH%: 10.3 % — ABNORMAL LOW (ref 14.0–49.7)
MCH: 26.3 pg (ref 25.1–34.0)
MCHC: 32.2 g/dL (ref 31.5–36.0)
MCV: 81.6 fL (ref 79.5–101.0)
MONO#: 0.7 10*3/uL (ref 0.1–0.9)
MONO%: 9.3 % (ref 0.0–14.0)
NEUT%: 77.7 % — ABNORMAL HIGH (ref 38.4–76.8)
NEUTROS ABS: 6.1 10*3/uL (ref 1.5–6.5)
PLATELETS: 712 10*3/uL — AB (ref 145–400)
RBC: 3.16 10*6/uL — AB (ref 3.70–5.45)
RDW: 14.8 % — ABNORMAL HIGH (ref 11.2–14.5)
WBC: 7.8 10*3/uL (ref 3.9–10.3)
lymph#: 0.8 10*3/uL — ABNORMAL LOW (ref 0.9–3.3)

## 2014-12-26 NOTE — Telephone Encounter (Signed)
Pt confirmed labs/ov per 09/22 POF, gave pt AVS and Calendar... KJ °

## 2014-12-26 NOTE — Progress Notes (Signed)
[facility] [phone]   [phone]  OFFICE PROGRESS NOTE  Glo Herring., MD 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 The patient has multifocal disease including 2 nodules in the right lung with a suspicious tiny lesion in the left lung.  PRIOR THERAPY: None.  CURRENT THERAPY:  1) Tarceva 150 mg by mouth daily status post 7 months of treatment. 2) palliative radiotherapy to the enlarging right upper lobe lung mass under the care of Dr. Pablo Ledger.  INTERVAL HISTORY: [name] 74 y.o. female returns to the clinic today for follow-up visit accompanied by her daughter. The patient is feeling fine today with no specific complaints except for shortness of breath. She was recently treated for pneumonia with a course of doxycycline. She is currently on home oxygen. The patient denied having any significant chest pain, cough or hemoptysis. She has no significant weight loss or night sweats. She is tolerating her treatment with oral Tarceva fairly well with no significant adverse effects except for mild diarrhea improved with Imodium. She is here today for routine evaluation and repeat blood work.  MEDICAL HISTORY: Past Medical History  Diagnosis Date  . COPD (chronic obstructive pulmonary disease)   . Thyroid disease   . Pernicious anemia   . Hypertension   . Lung cancer     ALLERGIES:  is allergic to codeine; penicillins; and pravastatin sodium.  MEDICATIONS:  Current Outpatient Prescriptions  Medication Sig Dispense Refill  . acetaminophen (TYLENOL) 500 MG tablet Take 500-1,000 mg by mouth every 6 (six) hours as needed for moderate pain.     Marland Kitchen albuterol (PROVENTIL) (2.5 MG/3ML) 0.083% nebulizer solution Take 2.5 mg by nebulization every 4 (four) hours. Or Four times daily    . ALPRAZolam (XANAX) 1 MG tablet Take 1 mg by mouth 4  (four) times daily as needed. For anxiety and/or sleep    . aspirin EC 81 MG EC tablet Take 1 tablet (81 mg total) by mouth daily.    Marland Kitchen atorvastatin (LIPITOR) 10 MG tablet Take 1 tablet (10 mg total) by mouth daily at 6 PM. 30 tablet 1  . clindamycin (CLEOCIN T) 1 % external solution Apply topically 2 (two) times daily. (Patient taking differently: Apply 1 application topically 2 (two) times daily as needed (acne breakouts). ) 30 mL 1  . Cyanocobalamin (VITAMIN B-12) 1000 MCG SUBL Place 1 tablet under the tongue daily.    Marland Kitchen doxycycline (VIBRAMYCIN) 100 MG capsule Take 100 mg by mouth 2 (two) times daily.    . ferrous sulfate 325 (65 FE) MG tablet Take 325 mg by mouth 3 (three) times daily with meals.    . Fluticasone-Salmeterol (ADVAIR) 250-50 MCG/DOSE AEPB Inhale 1 puff into the lungs every 12 (twelve) hours.    Marland Kitchen guaiFENesin (MUCINEX) 600 MG 12 hr tablet Take 600 mg by mouth daily as needed for cough.     . levothyroxine (SYNTHROID, LEVOTHROID) 100 MCG tablet Take 100 mcg by mouth daily before breakfast.     . loperamide (IMODIUM) 2 MG capsule Take 2 mg by mouth as needed for diarrhea or loose stools.    Marland Kitchen loratadine (CLARITIN) 10 MG tablet Take 10 mg by mouth daily as needed for allergies.     . NON FORMULARY OXYGEN      24/7    . potassium chloride SA (K-DUR,KLOR-CON) 20 MEQ tablet Take 1 tablet (  20 mEq total) by mouth 2 (two) times daily. (Patient taking differently: Take 20 mEq by mouth 2 (two) times daily as needed (when taking a fluid pill). ) 14 tablet 0  . TARCEVA 150 MG tablet TAKE 1 TABLET BY MOUTH DAILY. TAKE ON AN EMPTY STOMACH 1 HOUR BEFORE MEALS OR 2 HOURS AFTER. 30 tablet 2  . torsemide (DEMADEX) 20 MG tablet Take 10 mg by mouth daily as needed (fluid retention).     . Vitamin D, Ergocalciferol, (DRISDOL) 50000 UNITS CAPS capsule Take 50,000 Units by mouth every 30 (thirty) days. Fridays     No current facility-administered medications for this visit.    SURGICAL HISTORY:  Past  Surgical History  Procedure Laterality Date  . Abdominal hysterectomy    . Appendectomy    . Cholecystectomy    . Tubal ligation    . Chest tube insertion  03/08/2012    Procedure: CHEST TUBE INSERTION;  Surgeon: Ivin Poot, MD;  Location: [facility];  Service: Thoracic;  Laterality: Right;  . Video assisted thoracoscopy  03/17/2012    Procedure: VIDEO ASSISTED THORACOSCOPY;  Surgeon: Ivin Poot, MD;  Location: [facility];  Service: Thoracic;  Laterality: Right;  . Resection of apical bleb  03/17/2012    Procedure: RESECTION OF APICAL BLEB;  Surgeon: Ivin Poot, MD;  Location: [facility];  Service: Thoracic;  Laterality: Right;  stapling of bleb  . Colonoscopy N/A 04/29/2014    Procedure: COLONOSCOPY;  Surgeon: Daneil Dolin, MD;  [facility];  Service: Endoscopy;  Laterality: N/A;  1245pm  . Esophagogastroduodenoscopy N/A 04/29/2014    Procedure: ESOPHAGOGASTRODUODENOSCOPY (EGD);  Surgeon: Daneil Dolin, MD;  [facility];  Service: Endoscopy;  Laterality: N/A;  . Savory dilation N/A 04/29/2014    Procedure: SAVORY DILATION;  Surgeon: Daneil Dolin, MD;  [facility];  Service: Endoscopy;  Laterality: N/A;  Venia Minks dilation N/A 04/29/2014    Procedure: Venia Minks DILATION;  Surgeon: Daneil Dolin, MD;  [facility];  Service: Endoscopy;  Laterality: N/A;  . Tee without cardioversion N/A 11/15/2014    Procedure: TRANSESOPHAGEAL ECHOCARDIOGRAM (TEE);  Surgeon: Sueanne Margarita, MD;  Location: [facility];  Service: Cardiovascular;  Laterality: N/A;    REVIEW OF SYSTEMS:  A comprehensive review of systems was negative except for: Constitutional: positive for fatigue Respiratory: positive for dyspnea on exertion Gastrointestinal: positive for diarrhea   PHYSICAL EXAMINATION: General appearance: alert, cooperative, fatigued and no distress Head: Normocephalic, without obvious abnormality, atraumatic Neck: no adenopathy, no JVD, supple, symmetrical,  trachea midline and thyroid not enlarged, symmetric, no tenderness/mass/nodules Lymph nodes: Cervical, supraclavicular, and axillary nodes normal. Resp: clear to auscultation bilaterally Back: symmetric, no curvature. ROM normal. No CVA tenderness. Cardio: regular rate and rhythm, S1, S2 normal, no murmur, click, rub or gallop GI: soft, non-tender; bowel sounds normal; no masses,  no organomegaly Extremities: extremities normal, atraumatic, no cyanosis or edema Neurologic: Alert and oriented X 3, normal strength and tone. Normal symmetric reflexes. Normal coordination and gait  ECOG PERFORMANCE STATUS: 2 - Symptomatic, <50% confined to bed  Blood pressure 127/79, pulse 93, temperature 98.4 F (36.9 C), temperature source Oral, resp. rate 19, height 5' 4"  (1.626 m), weight 114 lb 14.4 oz (52.118 kg), SpO2 95 %.  LABORATORY DATA: Lab Results  Component Value Date   WBC 7.8 12/26/2014   HGB 8.3* 12/26/2014   HCT 25.8* 12/26/2014   MCV 81.6 12/26/2014   PLT  712* 12/26/2014      Chemistry      Component Value Date/Time   NA 139 12/26/2014 0926   NA 140 11/13/2014 1744   K 3.4* 12/26/2014 0926   K 3.1* 11/13/2014 1744   CL 103 11/13/2014 1744   CO2 29 12/26/2014 0926   CO2 26 11/13/2014 1736   BUN 15.1 12/26/2014 0926   BUN 16 11/13/2014 1744   CREATININE 0.9 12/26/2014 0926   CREATININE 1.00 11/13/2014 1744   CREATININE 1.13* 03/14/2013 1210      Component Value Date/Time   CALCIUM 8.3* 12/26/2014 0926   CALCIUM 8.7* 11/13/2014 1736   ALKPHOS 215* 12/26/2014 0926   ALKPHOS 118 11/13/2014 1736   AST 21 12/26/2014 0926   AST 17 11/13/2014 1736   ALT 15 12/26/2014 0926   ALT 11* 11/13/2014 1736   BILITOT 0.84 12/26/2014 0926   BILITOT 0.7 11/13/2014 1736       RADIOGRAPHIC STUDIES: No results found.  ASSESSMENT AND PLAN: This is a very pleasant 74 years old with metastatic non-small cell lung cancer, adenocarcinoma with positive EGFR mutation who is currently  undergoing treatment with Tarceva 150 mg by mouth daily status post 7 months of treatment. The patient is tolerating her treatment fairly well with no significant adverse effects. She is currently undergoing palliative radiotherapy to the enlarging right upper lobe lung mass. I recommended for her to continue her treatment with the same dose of Tarceva 150 mg by mouth daily. For the heartburn, The patient will continue to use Zantac or Pepcid but at least 2 hours after her dose of Tarceva. The patient would come back for follow-up visit in one month for reevaluation with repeat blood work. She was advised to call immediately if she has any concerning symptoms in the interval. The patient voices understanding of current disease status and treatment options and is in agreement with the current care plan.  All questions were answered. The patient knows to call the clinic with any problems, questions or concerns. We can certainly see the patient much sooner if necessary.  Disclaimer: This note was dictated with voice recognition software. Similar sounding words can inadvertently be transcribed and may not be corrected upon review.

## 2014-12-27 ENCOUNTER — Ambulatory Visit
Admission: RE | Admit: 2014-12-27 | Discharge: 2014-12-27 | Disposition: A | Discharge status: Home / Self Care | Payer: PPO | Source: Ambulatory Visit | Attending: Radiation Oncology | Admitting: Radiation Oncology

## 2014-12-27 DIAGNOSIS — Z510 Encounter for antineoplastic radiation therapy: Secondary | ICD-10-CM | POA: Diagnosis not present

## 2014-12-30 ENCOUNTER — Ambulatory Visit
Admission: RE | Admit: 2014-12-30 | Discharge: 2014-12-30 | Disposition: A | Discharge status: Home / Self Care | Payer: PPO | Source: Ambulatory Visit | Attending: Radiation Oncology | Admitting: Radiation Oncology

## 2014-12-30 DIAGNOSIS — Z510 Encounter for antineoplastic radiation therapy: Secondary | ICD-10-CM | POA: Diagnosis not present

## 2014-12-31 ENCOUNTER — Ambulatory Visit
Admission: RE | Admit: 2014-12-31 | Discharge: 2014-12-31 | Disposition: A | Discharge status: Home / Self Care | Payer: PPO | Source: Ambulatory Visit | Attending: Radiation Oncology | Admitting: Radiation Oncology

## 2014-12-31 VITALS — BP 126/61 | HR 79 | Temp 97.8°F

## 2014-12-31 DIAGNOSIS — C3412 Malignant neoplasm of upper lobe, left bronchus or lung: Secondary | ICD-10-CM

## 2014-12-31 DIAGNOSIS — Z510 Encounter for antineoplastic radiation therapy: Secondary | ICD-10-CM | POA: Diagnosis not present

## 2014-12-31 NOTE — Progress Notes (Signed)
Weekly Management Note Current Dose: 18Gy  Projected Dose: 30 Gy   Narrative:  The patient presents for routine under treatment assessment.  CBCT/MVCT images/Port film x-rays were reviewed.  The chart was checked. Weekly assessment of radiation to lung.completed 6 of 10 fractions. She reports chronic back pain. Rose Sermon, RN reinforced with the patient to take pain medication prior to coming for treatment as the treatment table is hard. She has some mild nausea and is overall fatigued. He daughter reports the pt has a poor appetite. Stopped doxycycline a few days ago after tx for pneumonia. Has phergan for anusea.   Physical Findings: Alert and oriented x 3. Presents in a wheelchair. Nasal cannula in place with 2 L of oxygen. No skin changes in the txt area.  Vitals:  Filed Vitals:   12/31/14 1143  BP: 126/61  Pulse: 79  Temp: 97.8 F (36.6 C)   Weight:  Wt Readings from Last 3 Encounters:  12/26/14 114 lb 14.4 oz (52.118 kg)  12/24/14 116 lb 9.6 oz (52.889 kg)  12/05/14 117 lb 8 oz (53.298 kg)   Lab Results  Component Value Date   WBC 7.8 12/26/2014   HGB 8.3* 12/26/2014   HCT 25.8* 12/26/2014   MCV 81.6 12/26/2014   PLT 712* 12/26/2014   Lab Results  Component Value Date   CREATININE 0.9 12/26/2014   BUN 15.1 12/26/2014   NA 139 12/26/2014   K 3.4* 12/26/2014   CL 103 11/13/2014   CO2 29 12/26/2014    Impression:  The patient is tolerating radiation.  Plan:  Continue treatment as planned. I advised the patient to keep up her nutrition. She is to eat smaller meals more frequently and take phenergan prn. . She has a f/u with Dr. Julien Reese on 10/24. I will see her in 1 month.  This document serves as a record of services personally performed by Rose Silversmith, MD. It was created on her behalf by [name], a trained medical scribe. The creation of this record is based on the scribe's personal observations and the provider's statements to them. This document has  been checked and approved by the attending provider.

## 2014-12-31 NOTE — Progress Notes (Signed)
BP 126/61 mmHg  Pulse 79  Temp(Src) 97.8 F (36.6 C)  SpO2 100%  Weekly assessment of radiation to lung.completed 6 of 10 fractions.Has back pain.Reinforced with patient to take pain medication prior to coming for treatment as treatment table is hard.Has some mild nausea.Overall fatigued.

## 2015-01-01 ENCOUNTER — Ambulatory Visit
Admission: RE | Admit: 2015-01-01 | Discharge: 2015-01-01 | Disposition: A | Discharge status: Home / Self Care | Payer: PPO | Source: Ambulatory Visit | Attending: Radiation Oncology | Admitting: Radiation Oncology

## 2015-01-01 ENCOUNTER — Ambulatory Visit: Payer: PPO

## 2015-01-01 DIAGNOSIS — Z510 Encounter for antineoplastic radiation therapy: Secondary | ICD-10-CM | POA: Diagnosis not present

## 2015-01-02 ENCOUNTER — Ambulatory Visit
Admission: RE | Admit: 2015-01-02 | Discharge: 2015-01-02 | Disposition: A | Discharge status: Home / Self Care | Payer: PPO | Source: Ambulatory Visit | Attending: Radiation Oncology | Admitting: Radiation Oncology

## 2015-01-02 DIAGNOSIS — Z510 Encounter for antineoplastic radiation therapy: Secondary | ICD-10-CM | POA: Diagnosis not present

## 2015-01-03 ENCOUNTER — Ambulatory Visit: Payer: PPO

## 2015-01-03 ENCOUNTER — Ambulatory Visit
Admission: RE | Admit: 2015-01-03 | Discharge: 2015-01-03 | Disposition: A | Discharge status: Home / Self Care | Payer: PPO | Source: Ambulatory Visit | Attending: Radiation Oncology | Admitting: Radiation Oncology

## 2015-01-03 DIAGNOSIS — Z510 Encounter for antineoplastic radiation therapy: Secondary | ICD-10-CM | POA: Diagnosis not present

## 2015-01-06 ENCOUNTER — Encounter: Payer: Self-pay | Admitting: Radiation Oncology

## 2015-01-06 ENCOUNTER — Ambulatory Visit
Admission: RE | Admit: 2015-01-06 | Discharge: 2015-01-06 | Disposition: A | Discharge status: Home / Self Care | Payer: PPO | Source: Ambulatory Visit | Attending: Radiation Oncology | Admitting: Radiation Oncology

## 2015-01-06 DIAGNOSIS — Z510 Encounter for antineoplastic radiation therapy: Secondary | ICD-10-CM | POA: Diagnosis not present

## 2015-01-08 NOTE — Progress Notes (Signed)
  Radiation Oncology         [phone] ________________________________  Name: LALISA KIEHN MRN: [id number]  Date: 01/06/2015  DOB: 04/14/1940  End of Treatment Note  Diagnosis:   Lung cancer, upper lobe ([place])   Staging form: Lung, AJCC 7th Edition     Clinical stage from 05/16/2014: Stage IV (T3, N0, M1a) - Signed by Curt Bears, MD on 05/16/2014      Indication for treatment:  Palliative       Radiation treatment dates:   12/24/2014-01/06/2015  Site/dose:   Right upper lobe/ 30 Gy in 10 fractions at 3 Gy per fraction  Beams/energy:   3D conformal Plan with 10 and 6 MV photons. Daily image guidance via CBCT was utilized.   Narrative: The patient tolerated radiation treatment relatively well.   She had minimal skin changes and no esophagitis during treatment.   Plan: The patient has completed radiation treatment. The patient will return to radiation oncology clinic for routine followup in one month. I advised them to call or return sooner if they have any questions or concerns related to their recovery or treatment.  ------------------------------------------------  Thea Silversmith, MD

## 2015-01-23 ENCOUNTER — Encounter: Payer: Self-pay | Admitting: Radiation Oncology

## 2015-01-27 ENCOUNTER — Ambulatory Visit (HOSPITAL_BASED_OUTPATIENT_CLINIC_OR_DEPARTMENT_OTHER): Payer: PPO | Admitting: Internal Medicine

## 2015-01-27 ENCOUNTER — Other Ambulatory Visit (HOSPITAL_BASED_OUTPATIENT_CLINIC_OR_DEPARTMENT_OTHER): Payer: PPO

## 2015-01-27 ENCOUNTER — Encounter: Payer: Self-pay | Admitting: Internal Medicine

## 2015-01-27 ENCOUNTER — Telehealth: Payer: Self-pay | Admitting: Internal Medicine

## 2015-01-27 VITALS — BP 137/83 | HR 103 | Temp 97.9°F | Resp 17 | Ht 64.0 in | Wt 105.7 lb

## 2015-01-27 DIAGNOSIS — C3411 Malignant neoplasm of upper lobe, right bronchus or lung: Secondary | ICD-10-CM

## 2015-01-27 DIAGNOSIS — D649 Anemia, unspecified: Secondary | ICD-10-CM

## 2015-01-27 DIAGNOSIS — R634 Abnormal weight loss: Secondary | ICD-10-CM | POA: Diagnosis not present

## 2015-01-27 DIAGNOSIS — R630 Anorexia: Secondary | ICD-10-CM

## 2015-01-27 LAB — CBC WITH DIFFERENTIAL/PLATELET
BASO%: 1.1 % (ref 0.0–2.0)
BASOS ABS: 0 10*3/uL (ref 0.0–0.1)
EOS ABS: 0.1 10*3/uL (ref 0.0–0.5)
EOS%: 1.5 % (ref 0.0–7.0)
HEMATOCRIT: 25.4 % — AB (ref 34.8–46.6)
HGB: 8.1 g/dL — ABNORMAL LOW (ref 11.6–15.9)
LYMPH#: 0.5 10*3/uL — AB (ref 0.9–3.3)
LYMPH%: 12.4 % — AB (ref 14.0–49.7)
MCH: 25.4 pg (ref 25.1–34.0)
MCHC: 31.8 g/dL (ref 31.5–36.0)
MCV: 79.9 fL (ref 79.5–101.0)
MONO#: 0.5 10*3/uL (ref 0.1–0.9)
MONO%: 12.7 % (ref 0.0–14.0)
NEUT#: 3.1 10*3/uL (ref 1.5–6.5)
NEUT%: 72.3 % (ref 38.4–76.8)
Platelets: 405 10*3/uL — ABNORMAL HIGH (ref 145–400)
RBC: 3.19 10*6/uL — AB (ref 3.70–5.45)
RDW: 15.4 % — ABNORMAL HIGH (ref 11.2–14.5)
WBC: 4.3 10*3/uL (ref 3.9–10.3)

## 2015-01-27 LAB — COMPREHENSIVE METABOLIC PANEL (CC13)
ANION GAP: 9 meq/L (ref 3–11)
AST: 12 U/L (ref 5–34)
Albumin: 2 g/dL — ABNORMAL LOW (ref 3.5–5.0)
Alkaline Phosphatase: 201 U/L — ABNORMAL HIGH (ref 40–150)
BILIRUBIN TOTAL: 0.42 mg/dL (ref 0.20–1.20)
BUN: 15.2 mg/dL (ref 7.0–26.0)
CALCIUM: 8 mg/dL — AB (ref 8.4–10.4)
CHLORIDE: 103 meq/L (ref 98–109)
CO2: 26 meq/L (ref 22–29)
CREATININE: 0.8 mg/dL (ref 0.6–1.1)
EGFR: 71 mL/min/{1.73_m2} — ABNORMAL LOW (ref >90)
Glucose: 100 mg/dl (ref 70–140)
Potassium: 3.2 mEq/L — ABNORMAL LOW (ref 3.5–5.1)
Sodium: 138 mEq/L (ref 136–145)
TOTAL PROTEIN: 5.1 g/dL — AB (ref 6.4–8.3)

## 2015-01-27 MED ORDER — METHYLPREDNISOLONE 4 MG PO TBPK: ORAL_TABLET | ORAL | Status: DC

## 2015-01-27 NOTE — Progress Notes (Signed)
Quick Note:  Call patient with the result and Order K Dur 20 meq po qd X 7 days. ______

## 2015-01-27 NOTE — Progress Notes (Signed)
[facility] Telephone:([phone]) [phone]   Fax:([phone]) [phone]  OFFICE PROGRESS NOTE  Glo Herring., MD [place] [address]  DIAGNOSIS: Recurrent non-small cell lung cancer, adenocarcinoma with positive EGFR mutation in exon 21 ([id number]). The patient has multifocal disease including 2 nodules in the right lung with a suspicious tiny lesion in the left lung.  PRIOR THERAPY: Palliative radiotherapy to the enlarging right upper lobe lung mass under the care of Dr. Pablo Ledger completed on 01/06/2015.  CURRENT THERAPY:  1) Tarceva 150 mg by mouth daily status post 7 months of treatment.  INTERVAL HISTORY: [name] 74 y.o. female returns to the clinic today for follow-up visit accompanied by her daughter. The patient is feeling fine today with no specific complaints except for shortness of breath. She also has lack of appetite and lost around 9 pounds since her last visit. The patient denied having any significant chest pain, cough or hemoptysis but continues to have shortness of breath and currently on home oxygen. She has no significant weight loss or night sweats. She is tolerating her treatment with oral Tarceva fairly well with no significant adverse effects except for mild diarrhea improved with Imodium. She is here today for routine evaluation and repeat blood work.  MEDICAL HISTORY: Past Medical History  Diagnosis Date  . COPD (chronic obstructive pulmonary disease) ([facility])   . Thyroid disease   . Pernicious anemia   . Hypertension   . Lung cancer ([place])     ALLERGIES:  is allergic to codeine; penicillins; and pravastatin sodium.  MEDICATIONS:  Current Outpatient Prescriptions  Medication Sig Dispense Refill  . acetaminophen (TYLENOL) 500 MG tablet Take 500-1,000 mg by mouth every 6 (six) hours as needed for moderate pain.     Marland Kitchen albuterol (PROVENTIL) (2.5 MG/3ML) 0.083% nebulizer solution Take 2.5 mg by nebulization every 4 (four) hours. Or  Four times daily    . ALPRAZolam (XANAX) 1 MG tablet Take 1 mg by mouth 4 (four) times daily as needed. For anxiety and/or sleep    . aspirin EC 81 MG EC tablet Take 1 tablet (81 mg total) by mouth daily.    . clindamycin (CLEOCIN T) 1 % external solution Apply topically 2 (two) times daily. (Patient taking differently: Apply 1 application topically 2 (two) times daily as needed (acne breakouts). ) 30 mL 1  . Cyanocobalamin (VITAMIN B-12) 1000 MCG SUBL Place 1 tablet under the tongue daily.    . ferrous sulfate 325 (65 FE) MG tablet Take 325 mg by mouth 3 (three) times daily with meals.    . Fluticasone-Salmeterol (ADVAIR) 250-50 MCG/DOSE AEPB Inhale 1 puff into the lungs every 12 (twelve) hours.    Marland Kitchen guaiFENesin (MUCINEX) 600 MG 12 hr tablet Take 600 mg by mouth daily as needed for cough.     . levothyroxine (SYNTHROID, LEVOTHROID) 100 MCG tablet Take 100 mcg by mouth daily before breakfast.     . loperamide (IMODIUM) 2 MG capsule Take 2 mg by mouth as needed for diarrhea or loose stools.    Marland Kitchen loratadine (CLARITIN) 10 MG tablet Take 10 mg by mouth daily as needed for allergies.     . NON FORMULARY OXYGEN      24/7    . potassium chloride SA (K-DUR,KLOR-CON) 20 MEQ tablet Take 1 tablet (20 mEq total) by mouth 2 (two) times daily. (Patient taking differently: Take 20 mEq by mouth 2 (two) times daily as needed (when taking a fluid  pill). ) 14 tablet 0  . TARCEVA 150 MG tablet TAKE 1 TABLET BY MOUTH DAILY. TAKE ON AN EMPTY STOMACH 1 HOUR BEFORE MEALS OR 2 HOURS AFTER. 30 tablet 2  . torsemide (DEMADEX) 20 MG tablet Take 10 mg by mouth daily as needed (fluid retention).     . Vitamin D, Ergocalciferol, (DRISDOL) 50000 UNITS CAPS capsule Take 50,000 Units by mouth every 30 (thirty) days. Fridays    . atorvastatin (LIPITOR) 10 MG tablet Take 1 tablet (10 mg total) by mouth daily at 6 PM. (Patient not taking: Reported on 01/27/2015) 30 tablet 1   No current facility-administered medications for this  visit.    SURGICAL HISTORY:  Past Surgical History  Procedure Laterality Date  . Abdominal hysterectomy    . Appendectomy    . Cholecystectomy    . Tubal ligation    . Chest tube insertion  03/08/2012    Procedure: CHEST TUBE INSERTION;  Surgeon: Ivin Poot, MD;  Location: [facility];  Service: Thoracic;  Laterality: Right;  . Video assisted thoracoscopy  03/17/2012    Procedure: VIDEO ASSISTED THORACOSCOPY;  Surgeon: Ivin Poot, MD;  Location: [facility];  Service: Thoracic;  Laterality: Right;  . Resection of apical bleb  03/17/2012    Procedure: RESECTION OF APICAL BLEB;  Surgeon: Ivin Poot, MD;  Location: [facility];  Service: Thoracic;  Laterality: Right;  stapling of bleb  . Colonoscopy N/A 04/29/2014    Procedure: COLONOSCOPY;  Surgeon: Daneil Dolin, MD;  [facility];  Service: Endoscopy;  Laterality: N/A;  1245pm  . Esophagogastroduodenoscopy N/A 04/29/2014    Procedure: ESOPHAGOGASTRODUODENOSCOPY (EGD);  Surgeon: Daneil Dolin, MD;  [facility];  Service: Endoscopy;  Laterality: N/A;  . Savory dilation N/A 04/29/2014    Procedure: SAVORY DILATION;  Surgeon: Daneil Dolin, MD;  [facility];  Service: Endoscopy;  Laterality: N/A;  Venia Minks dilation N/A 04/29/2014    Procedure: Venia Minks DILATION;  Surgeon: Daneil Dolin, MD;  [facility];  Service: Endoscopy;  Laterality: N/A;  . Tee without cardioversion N/A 11/15/2014    Procedure: TRANSESOPHAGEAL ECHOCARDIOGRAM (TEE);  Surgeon: Sueanne Margarita, MD;  Location[facility];  Service: Cardiovascular;  Laterality: N/A;    REVIEW OF SYSTEMS:  A comprehensive review of systems was negative except for: Constitutional: positive for fatigue Respiratory: positive for dyspnea on exertion Gastrointestinal: positive for diarrhea   PHYSICAL EXAMINATION: General appearance: alert, cooperative, fatigued and no distress Head: Normocephalic, without obvious abnormality, atraumatic Neck: no  adenopathy, no JVD, supple, symmetrical, trachea midline and thyroid not enlarged, symmetric, no tenderness/mass/nodules Lymph nodes: Cervical, supraclavicular, and axillary nodes normal. Resp: clear to auscultation bilaterally Back: symmetric, no curvature. ROM normal. No CVA tenderness. Cardio: regular rate and rhythm, S1, S2 normal, no murmur, click, rub or gallop GI: soft, non-tender; bowel sounds normal; no masses,  no organomegaly Extremities: extremities normal, atraumatic, no cyanosis or edema Neurologic: Alert and oriented X 3, normal strength and tone. Normal symmetric reflexes. Normal coordination and gait  ECOG PERFORMANCE STATUS: 2 - Symptomatic, <50% confined to bed  Blood pressure 137/83, pulse 103, temperature 97.9 F (36.6 C), temperature source Oral, resp. rate 17, height 5' 4"  (1.626 m), weight 105 lb 11.2 oz (47.945 kg), SpO2 99 %.  LABORATORY DATA: Lab Results  Component Value Date   WBC 4.3 01/27/2015   HGB 8.1* 01/27/2015   HCT 25.4* 01/27/2015   MCV 79.9 01/27/2015  PLT 405* 01/27/2015      Chemistry      Component Value Date/Time   NA 139 12/26/2014 0926   NA 140 11/13/2014 1744   K 3.4* 12/26/2014 0926   K 3.1* 11/13/2014 1744   CL 103 11/13/2014 1744   CO2 29 12/26/2014 0926   CO2 26 11/13/2014 1736   BUN 15.1 12/26/2014 0926   BUN 16 11/13/2014 1744   CREATININE 0.9 12/26/2014 0926   CREATININE 1.00 11/13/2014 1744   CREATININE 1.13* 03/14/2013 1210      Component Value Date/Time   CALCIUM 8.3* 12/26/2014 0926   CALCIUM 8.7* 11/13/2014 1736   ALKPHOS 215* 12/26/2014 0926   ALKPHOS 118 11/13/2014 1736   AST 21 12/26/2014 0926   AST 17 11/13/2014 1736   ALT 15 12/26/2014 0926   ALT 11* 11/13/2014 1736   BILITOT 0.84 12/26/2014 0926   BILITOT 0.7 11/13/2014 1736       RADIOGRAPHIC STUDIES: No results found.  ASSESSMENT AND PLAN: This is a very pleasant 74 years old with metastatic non-small cell lung cancer, adenocarcinoma with  positive EGFR mutation who is currently undergoing treatment with Tarceva 150 mg by mouth daily status post 7 months of treatment. The patient is tolerating her treatment fairly well with no significant adverse effects. I recommended for her to continue her treatment with the same dose of Tarceva 150 mg by mouth daily. For the anemia, I recommended for the patient to take her oral iron tablet on daily basis. For the lack of appetite and weight loss, I will start the patient on Medrol Dosepak. The patient would come back for follow-up visit in one month for reevaluation with repeat blood work as well as CT scan of the chest, abdomen and pelvis for restaging of her disease. She was advised to call immediately if she has any concerning symptoms in the interval. The patient voices understanding of current disease status and treatment options and is in agreement with the current care plan.  All questions were answered. The patient knows to call the clinic with any problems, questions or concerns. We can certainly see the patient much sooner if necessary.  Disclaimer: This note was dictated with voice recognition software. Similar sounding words can inadvertently be transcribed and may not be corrected upon review.

## 2015-01-27 NOTE — Telephone Encounter (Signed)
Gave adn printed appt sched and avs for pt for NOV...gv barium

## 2015-01-28 ENCOUNTER — Telehealth: Payer: Self-pay | Admitting: *Deleted

## 2015-01-28 MED ORDER — POTASSIUM CHLORIDE CRYS ER 20 MEQ PO TBCR: 20.0000 meq | EXTENDED_RELEASE_TABLET | Freq: Every day | ORAL | Status: DC

## 2015-01-28 NOTE — Telephone Encounter (Signed)
Called notified pt of results . Rx for [place] sent to pt pharmacy.

## 2015-01-28 NOTE — Telephone Encounter (Signed)
-----   Message from Curt Bears, MD sent at 01/27/2015  8:26 PM EDT ----- Call patient with the result and Order K Dur 20 meq po qd X 7 days.

## 2015-02-03 ENCOUNTER — Telehealth: Payer: Self-pay | Admitting: Medical Oncology

## 2015-02-03 NOTE — Telephone Encounter (Signed)
-----   Message from Curt Bears, MD sent at 01/27/2015  8:26 PM EDT ----- Call patient with the result and Order K Dur 20 meq po qd X 7 days.

## 2015-02-03 NOTE — Telephone Encounter (Signed)
Done previously

## 2015-02-19 ENCOUNTER — Other Ambulatory Visit: Payer: Self-pay | Admitting: Internal Medicine

## 2015-03-03 ENCOUNTER — Ambulatory Visit (HOSPITAL_COMMUNITY)
Admission: RE | Admit: 2015-03-03 | Discharge: 2015-03-03 | Disposition: A | Discharge status: Home / Self Care | Payer: PPO | Source: Ambulatory Visit | Attending: Internal Medicine | Admitting: Internal Medicine

## 2015-03-03 ENCOUNTER — Other Ambulatory Visit (HOSPITAL_BASED_OUTPATIENT_CLINIC_OR_DEPARTMENT_OTHER): Payer: PPO

## 2015-03-03 ENCOUNTER — Encounter (HOSPITAL_COMMUNITY): Payer: Self-pay

## 2015-03-03 ENCOUNTER — Telehealth: Payer: Self-pay | Admitting: *Deleted

## 2015-03-03 ENCOUNTER — Other Ambulatory Visit: Payer: PPO

## 2015-03-03 DIAGNOSIS — R911 Solitary pulmonary nodule: Secondary | ICD-10-CM | POA: Diagnosis not present

## 2015-03-03 DIAGNOSIS — C3411 Malignant neoplasm of upper lobe, right bronchus or lung: Secondary | ICD-10-CM | POA: Insufficient documentation

## 2015-03-03 LAB — CBC WITH DIFFERENTIAL/PLATELET
BASO%: 0.8 % (ref 0.0–2.0)
BASOS ABS: 0 10*3/uL (ref 0.0–0.1)
EOS ABS: 0.1 10*3/uL (ref 0.0–0.5)
EOS%: 2.4 % (ref 0.0–7.0)
HCT: 28.7 % — ABNORMAL LOW (ref 34.8–46.6)
HGB: 8.8 g/dL — ABNORMAL LOW (ref 11.6–15.9)
LYMPH%: 16.9 % (ref 14.0–49.7)
MCH: 24.1 pg — ABNORMAL LOW (ref 25.1–34.0)
MCHC: 30.6 g/dL — AB (ref 31.5–36.0)
MCV: 78.6 fL — AB (ref 79.5–101.0)
MONO#: 0.6 10*3/uL (ref 0.1–0.9)
MONO%: 10.6 % (ref 0.0–14.0)
NEUT#: 4.1 10*3/uL (ref 1.5–6.5)
NEUT%: 69.3 % (ref 38.4–76.8)
PLATELETS: 361 10*3/uL (ref 145–400)
RBC: 3.65 10*6/uL — ABNORMAL LOW (ref 3.70–5.45)
RDW: 17.2 % — ABNORMAL HIGH (ref 11.2–14.5)
WBC: 5.9 10*3/uL (ref 3.9–10.3)
lymph#: 1 10*3/uL (ref 0.9–3.3)

## 2015-03-03 LAB — COMPREHENSIVE METABOLIC PANEL (CC13)
ALT: 12 U/L (ref 0–55)
ANION GAP: 10 meq/L (ref 3–11)
AST: 22 U/L (ref 5–34)
Albumin: 2.3 g/dL — ABNORMAL LOW (ref 3.5–5.0)
Alkaline Phosphatase: 193 U/L — ABNORMAL HIGH (ref 40–150)
BILIRUBIN TOTAL: 0.47 mg/dL (ref 0.20–1.20)
BUN: 9.9 mg/dL (ref 7.0–26.0)
CO2: 22 mEq/L (ref 22–29)
Calcium: 7.7 mg/dL — ABNORMAL LOW (ref 8.4–10.4)
Chloride: 106 mEq/L (ref 98–109)
Creatinine: 0.8 mg/dL (ref 0.6–1.1)
EGFR: 70 mL/min/{1.73_m2} — AB (ref >90)
Glucose: 94 mg/dl (ref 70–140)
Potassium: 3.1 mEq/L — ABNORMAL LOW (ref 3.5–5.1)
Sodium: 139 mEq/L (ref 136–145)
TOTAL PROTEIN: 5.4 g/dL — AB (ref 6.4–8.3)

## 2015-03-03 MED ORDER — IOHEXOL 300 MG/ML  SOLN
100.0000 mL | Freq: Once | INTRAMUSCULAR | Status: AC | PRN
  Administered 2015-03-03: 100 mL via INTRAVENOUS

## 2015-03-03 NOTE — Telephone Encounter (Signed)
LMOVM for pt notified K+ 3.1. Request pt call back and confirm her pharmacy as she has [facility] and [facility] listed.

## 2015-03-03 NOTE — Telephone Encounter (Signed)
-----   Message from Curt Bears, MD sent at 03/03/2015  1:36 PM EST ----- Call patient with the result and order K Dur 20 meq po qd x 7 days.

## 2015-03-03 NOTE — Progress Notes (Signed)
Quick Note:  Call patient with the result and order K Dur 20 meq po qd x7 days ______ 

## 2015-03-03 NOTE — Telephone Encounter (Signed)
Pt called advised she  Does not need a rx as she has K+ 4mq pills at home and enough for 7 days. No further concerns.

## 2015-03-04 ENCOUNTER — Other Ambulatory Visit: Payer: Self-pay | Admitting: Medical Oncology

## 2015-03-04 ENCOUNTER — Telehealth: Payer: Self-pay | Admitting: Medical Oncology

## 2015-03-04 ENCOUNTER — Ambulatory Visit (HOSPITAL_BASED_OUTPATIENT_CLINIC_OR_DEPARTMENT_OTHER): Payer: PPO | Admitting: Internal Medicine

## 2015-03-04 ENCOUNTER — Telehealth: Payer: Self-pay | Admitting: Internal Medicine

## 2015-03-04 ENCOUNTER — Ambulatory Visit (HOSPITAL_COMMUNITY)
Admission: RE | Admit: 2015-03-04 | Discharge: 2015-03-04 | Disposition: A | Discharge status: Home / Self Care | Payer: PPO | Source: Ambulatory Visit | Attending: Internal Medicine | Admitting: Internal Medicine

## 2015-03-04 ENCOUNTER — Encounter: Payer: Self-pay | Admitting: Internal Medicine

## 2015-03-04 VITALS — BP 130/43 | HR 86 | Temp 98.0°F | Resp 17 | Ht 64.0 in | Wt 105.2 lb

## 2015-03-04 DIAGNOSIS — I713 Abdominal aortic aneurysm, ruptured, unspecified: Secondary | ICD-10-CM

## 2015-03-04 DIAGNOSIS — C3411 Malignant neoplasm of upper lobe, right bronchus or lung: Secondary | ICD-10-CM

## 2015-03-04 DIAGNOSIS — E079 Disorder of thyroid, unspecified: Secondary | ICD-10-CM | POA: Insufficient documentation

## 2015-03-04 DIAGNOSIS — C3492 Malignant neoplasm of unspecified part of left bronchus or lung: Secondary | ICD-10-CM | POA: Diagnosis not present

## 2015-03-04 DIAGNOSIS — D510 Vitamin B12 deficiency anemia due to intrinsic factor deficiency: Secondary | ICD-10-CM | POA: Insufficient documentation

## 2015-03-04 DIAGNOSIS — M7989 Other specified soft tissue disorders: Secondary | ICD-10-CM | POA: Insufficient documentation

## 2015-03-04 DIAGNOSIS — I714 Abdominal aortic aneurysm, without rupture, unspecified: Secondary | ICD-10-CM | POA: Insufficient documentation

## 2015-03-04 DIAGNOSIS — I10 Essential (primary) hypertension: Secondary | ICD-10-CM | POA: Insufficient documentation

## 2015-03-04 DIAGNOSIS — E876 Hypokalemia: Secondary | ICD-10-CM

## 2015-03-04 DIAGNOSIS — I513 Intracardiac thrombosis, not elsewhere classified: Secondary | ICD-10-CM | POA: Diagnosis not present

## 2015-03-04 DIAGNOSIS — Z923 Personal history of irradiation: Secondary | ICD-10-CM

## 2015-03-04 DIAGNOSIS — J449 Chronic obstructive pulmonary disease, unspecified: Secondary | ICD-10-CM | POA: Insufficient documentation

## 2015-03-04 DIAGNOSIS — L03119 Cellulitis of unspecified part of limb: Secondary | ICD-10-CM

## 2015-03-04 DIAGNOSIS — D649 Anemia, unspecified: Secondary | ICD-10-CM

## 2015-03-04 DIAGNOSIS — R2241 Localized swelling, mass and lump, right lower limb: Secondary | ICD-10-CM

## 2015-03-04 MED ORDER — POTASSIUM CHLORIDE CRYS ER 20 MEQ PO TBCR: 20.0000 meq | EXTENDED_RELEASE_TABLET | Freq: Every day | ORAL | Status: DC

## 2015-03-04 MED ORDER — DOXYCYCLINE HYCLATE 100 MG PO TABS: 100.0000 mg | ORAL_TABLET | Freq: Two times a day (BID) | ORAL | Status: DC

## 2015-03-04 NOTE — Telephone Encounter (Signed)
per pof to sch pt appt-cld referrrral for Vacualar Surgeon/sch doppler-gave pt copy of avs-

## 2015-03-04 NOTE — Telephone Encounter (Signed)
-----   Message from Curt Bears, MD sent at 03/03/2015  1:36 PM EST ----- Call patient with the result and order K Dur 20 meq po qd x 7 days.

## 2015-03-04 NOTE — Telephone Encounter (Signed)
Done. Pt notified that kdur rx was sent to local pharmacy with antibiotic rx

## 2015-03-04 NOTE — Progress Notes (Signed)
*  Preliminary Results* Right lower extremity venous duplex completed. Right lower extremity is negative for deep vein thrombosis. There is no evidence of right Baker's cyst.  03/04/2015 2:18 PM  Maudry Mayhew, RVT, RDCS, RDMS

## 2015-03-04 NOTE — Telephone Encounter (Signed)
Per Julien Nordmann rx sent to pharmacy

## 2015-03-04 NOTE — Progress Notes (Signed)
rx called to pharmacy 

## 2015-03-04 NOTE — Progress Notes (Signed)
[facility] Telephone:([phone]) [phone]   [phone]) [phone]  OFFICE PROGRESS NOTE  [name]., MD [place] [address]  DIAGNOSIS: Recurrent non-small cell lung cancer, adenocarcinoma with positive EGFR mutation in exon 21 ([id number]). The patient has multifocal disease including 2 nodules in the right lung with a suspicious tiny lesion in the left lung.  PRIOR THERAPY: Palliative radiotherapy to the enlarging right upper lobe lung mass under the care of Dr. Pablo Ledger completed on 01/06/2015.  CURRENT THERAPY:  1) Tarceva 150 mg by mouth daily status post 8 months of treatment.  INTERVAL HISTORY: [name] 73 y.o. female returns to the clinic today for follow-up visit accompanied by several family members. The patient is feeling fine today with no specific complaints except for shortness of breath, fatigue as well as swelling of the right lower extremity. She also has mild upper mid back pain. The patient denied having any significant chest pain, cough or hemoptysis but continues to have shortness of breath and currently on home oxygen. She has no significant weight loss or night sweats. She is tolerating her treatment with oral Tarceva fairly well with no significant adverse effects except for mild diarrhea improved with Imodium. She had repeat CT scan of the chest, abdomen and pelvis and she is here for evaluation and discussion of her scan results.  MEDICAL HISTORY: Past Medical History  Diagnosis Date  . COPD (chronic obstructive pulmonary disease) ([place])   . Thyroid disease   . Pernicious anemia   . Hypertension   . Lung cancer ([place])     ALLERGIES:  is allergic to codeine; penicillins; and pravastatin sodium.  MEDICATIONS:  Current Outpatient Prescriptions  Medication Sig Dispense Refill  . acetaminophen (TYLENOL) 500 MG tablet Take 500-1,000 mg by mouth every 6 (six) hours as needed for moderate pain.     Marland Kitchen albuterol (PROVENTIL) (2.5  MG/3ML) 0.083% nebulizer solution Take 2.5 mg by nebulization every 4 (four) hours. Or Four times daily    . ALPRAZolam (XANAX) 1 MG tablet Take 1 mg by mouth 4 (four) times daily as needed. For anxiety and/or sleep    . aspirin EC 81 MG EC tablet Take 1 tablet (81 mg total) by mouth daily.    . Cyanocobalamin (VITAMIN B-12) 1000 MCG SUBL Place 1 tablet under the tongue daily.    . ferrous sulfate 325 (65 FE) MG tablet Take 325 mg by mouth 3 (three) times daily with meals.    . Fluticasone-Salmeterol (ADVAIR) 250-50 MCG/DOSE AEPB Inhale 1 puff into the lungs every 12 (twelve) hours.    Marland Kitchen levothyroxine (SYNTHROID, LEVOTHROID) 100 MCG tablet Take 100 mcg by mouth daily before breakfast.     . loperamide (IMODIUM) 2 MG capsule Take 2 mg by mouth as needed for diarrhea or loose stools.    Marland Kitchen loratadine (CLARITIN) 10 MG tablet Take 10 mg by mouth daily as needed for allergies.     . mirtazapine (REMERON) 7.5 MG tablet Take 7.5 mg by mouth at bedtime.  12  . NON FORMULARY OXYGEN      24/7    . potassium chloride SA (K-DUR,KLOR-CON) 20 MEQ tablet Take 1 tablet (20 mEq total) by mouth daily. 7 tablet 0  . TARCEVA 150 MG tablet TAKE 1 TABLET BY MOUTH DAILY. TAKE ON AN EMPTY STOMACH 1 HOUR BEFORE MEALS OR 2 HOURS AFTER. 30 tablet 2  . Vitamin D, Ergocalciferol, (DRISDOL) 50000 UNITS CAPS capsule Take 50,000 Units by  mouth every 30 (thirty) days. Fridays    . clindamycin (CLEOCIN T) 1 % external solution Apply topically 2 (two) times daily. (Patient not taking: Reported on 03/04/2015) 30 mL 1  . guaiFENesin (MUCINEX) 600 MG 12 hr tablet Take 600 mg by mouth daily as needed for cough.     . torsemide (DEMADEX) 20 MG tablet Take 10 mg by mouth daily as needed (fluid retention).      No current facility-administered medications for this visit.    SURGICAL HISTORY:  Past Surgical History  Procedure Laterality Date  . Abdominal hysterectomy    . Appendectomy    . Cholecystectomy    . Tubal ligation    .  Chest tube insertion  03/08/2012    Procedure: CHEST TUBE INSERTION;  Surgeon: Ivin Poot, MD;  Location: [facility];  Service: Thoracic;  Laterality: Right;  . Video assisted thoracoscopy  03/17/2012    Procedure: VIDEO ASSISTED THORACOSCOPY;  Surgeon: Ivin Poot, MD;  Location: [facility];  Service: Thoracic;  Laterality: Right;  . Resection of apical bleb  03/17/2012    Procedure: RESECTION OF APICAL BLEB;  Surgeon: Ivin Poot, MD;  Location: [facility];  Service: Thoracic;  Laterality: Right;  stapling of bleb  . Colonoscopy N/A 04/29/2014    Procedure: COLONOSCOPY;  Surgeon: Daneil Dolin, MD;  [facility];  Service: Endoscopy;  Laterality: N/A;  1245pm  . Esophagogastroduodenoscopy N/A 04/29/2014    Procedure: ESOPHAGOGASTRODUODENOSCOPY (EGD);  Surgeon: Daneil Dolin, MD;  [facility];  Service: Endoscopy;  Laterality: N/A;  . Savory dilation N/A 04/29/2014    Procedure: SAVORY DILATION;  Surgeon: Daneil Dolin, MD;  [facility];  Service: Endoscopy;  Laterality: N/A;  Venia Minks dilation N/A 04/29/2014    Procedure: Venia Minks DILATION;  Surgeon: Daneil Dolin, MD;  [facility];  Service: Endoscopy;  Laterality: N/A;  . Tee without cardioversion N/A 11/15/2014    Procedure: TRANSESOPHAGEAL ECHOCARDIOGRAM (TEE);  Surgeon: Sueanne Margarita, MD;  Location: [facility];  Service: Cardiovascular;  Laterality: N/A;    REVIEW OF SYSTEMS:  Constitutional: positive for fatigue Eyes: negative Ears, nose, mouth, throat, and face: negative Respiratory: positive for dyspnea on exertion Cardiovascular: negative Gastrointestinal: positive for diarrhea Genitourinary:negative Integument/breast: negative Hematologic/lymphatic: negative Musculoskeletal:positive for back pain and muscle weakness Neurological: negative Behavioral/Psych: negative Endocrine: negative Allergic/Immunologic: negative   PHYSICAL EXAMINATION: General appearance: alert,  cooperative, fatigued and no distress Head: Normocephalic, without obvious abnormality, atraumatic Neck: no adenopathy, no JVD, supple, symmetrical, trachea midline and thyroid not enlarged, symmetric, no tenderness/mass/nodules Lymph nodes: Cervical, supraclavicular, and axillary nodes normal. Resp: clear to auscultation bilaterally Back: symmetric, no curvature. ROM normal. No CVA tenderness. Cardio: regular rate and rhythm, S1, S2 normal, no murmur, click, rub or gallop GI: soft, non-tender; bowel sounds normal; no masses,  no organomegaly Extremities: extremities normal, atraumatic, no cyanosis or edema Neurologic: Alert and oriented X 3, normal strength and tone. Normal symmetric reflexes. Normal coordination and gait  ECOG PERFORMANCE STATUS: 2 - Symptomatic, <50% confined to bed  Blood pressure 130/43, pulse 86, temperature 98 F (36.7 C), temperature source Oral, resp. rate 17, height 5' 4"  (1.626 m), weight 105 lb 3.2 oz (47.718 kg), SpO2 98 %.  LABORATORY DATA: Lab Results  Component Value Date   WBC 5.9 03/03/2015   HGB 8.8* 03/03/2015   HCT 28.7* 03/03/2015   MCV 78.6* 03/03/2015   PLT 361 03/03/2015  Chemistry      Component Value Date/Time   NA 139 03/03/2015 1233   NA 140 11/13/2014 1744   K 3.1* 03/03/2015 1233   K 3.1* 11/13/2014 1744   CL 103 11/13/2014 1744   CO2 22 03/03/2015 1233   CO2 26 11/13/2014 1736   BUN 9.9 03/03/2015 1233   BUN 16 11/13/2014 1744   CREATININE 0.8 03/03/2015 1233   CREATININE 1.00 11/13/2014 1744   CREATININE 1.13* 03/14/2013 1210      Component Value Date/Time   CALCIUM 7.7* 03/03/2015 1233   CALCIUM 8.7* 11/13/2014 1736   ALKPHOS 193* 03/03/2015 1233   ALKPHOS 118 11/13/2014 1736   AST 22 03/03/2015 1233   AST 17 11/13/2014 1736   ALT 12 03/03/2015 1233   ALT 11* 11/13/2014 1736   BILITOT 0.47 03/03/2015 1233   BILITOT 0.7 11/13/2014 1736       RADIOGRAPHIC STUDIES: Ct Chest W Contrast  03/03/2015  CLINICAL  DATA:  Patient with history of right lung cancer diagnosed 2013 status post partial right lung resection. EXAM: CT CHEST, ABDOMEN, AND PELVIS WITH CONTRAST TECHNIQUE: Multidetector CT imaging of the chest, abdomen and pelvis was performed following the standard protocol during bolus administration of intravenous contrast. CONTRAST:  159m OMNIPAQUE IOHEXOL 300 MG/ML  SOLN COMPARISON:  CT CAP 11/18/2014. FINDINGS: CT CHEST FINDINGS Mediastinum/Nodes: No enlarged axillary, mediastinal or hilar lymphadenopathy. Normal heart size. No pericardial effusion. Coronary arterial vascular calcifications. Persistent dilatation of the main pulmonary artery suggestive of pulmonary arterial hypertension. Lungs/Pleura: Central airways are patent. There is increased volume loss within the right lung, particularly the right lower lobe with retraction and increased irregular consolidation likely post radiation changes. Additionally there is increased consolidation with central cavitation within the right upper lobe, measuring 3.6 x 2.7 cm (image 17; series 4). Additional previously described subpleural consolidation within the right upper lobe is slightly decreased when compared to prior examination measuring 2.6 x 2.7 cm, previously 3.1 x 2.7 cm (image 11; series 4). Unchanged 3 mm left lower lobe nodule (image 25; series 4). Additionally there is a new 4 mm nodule within the left upper lobe (image 9; series 4). No pleural effusion or pneumothorax. Musculoskeletal: Interval increase in size of lytic lesion involving the right aspect of the T3 vertebral body measuring 1.5 cm (image 49; series 602). CT ABDOMEN PELVIS FINDINGS Hepatobiliary: Liver is normal in size and contour. No focal hepatic lesion is identified. Fatty deposition adjacent to the falciform ligament. Status post cholecystectomy. Pancreas: Re- demonstrated multiple parenchymal calcifications within the pancreas. 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 Interval decrease in size of previously described cystic lesion within the pancreatic uncinate process. This is nearly completely resolved. Spleen: Unremarkable Adrenals/Urinary Tract: Adrenal glands are normal. Unchanged bilateral renal hypodensities. No hydronephrosis. The urinary bladder is unremarkable. Stomach/Bowel: No abnormal bowel wall thickening or evidence for bowel obstruction. No free fluid or free intraperitoneal air. Vascular/Lymphatic: Peripheral calcified atherosclerotic plaque. There is a 4.8 x 4.0 cm infrarenal abdominal aortic aneurysm which is stable to minimally increased in size from prior exam where it measured 3.9 x 4.7 cm. When compared to prior there is significant increase mural thrombus. Short segment occlusion of the left external iliac artery. Other: Patient status post hysterectomy.  No adnexal mass. Musculoskeletal: No aggressive or acute appearing osseous lesions. IMPRESSION: Interval increase in mural thrombus involving infrarenal abdominal aortic aneurysm measuring up to 4.8 cm in size, minimally increased from prior exam. Given the interval change  in appearance and relatively short time frame, vascular surgically consultation is recommended if not previously performed. Slight interval decrease in size of soft tissue mass adjacent to the pleural space within the right upper lobe. Otherwise there has been significant interval increase in surrounding irregular consolidation within the right upper and right lower lobes. These findings are may be secondary to posttreatment/radiation change however increasing/worsening disease is not excluded. Recommend attention on followup. Interval progression of lytic metastasis/ direct extension of tumor within the right aspect of the T3 vertebral body. Stable left lower lobe pulmonary nodule. Interval development of a new left upper lobe pulmonary nodule. This is nonspecific and may be infectious/ inflammatory in etiology.  Metastasis not excluded. Recommend attention on followup. These results will be called to the ordering clinician or representative by the Radiologist Assistant, and communication documented in the PACS or zVision Dashboard. Electronically Signed   By: Lovey Newcomer M.D.   On: 03/03/2015 16:45   Ct Abdomen Pelvis W Contrast  03/03/2015  CLINICAL DATA:  Patient with history of right lung cancer diagnosed 2013 status post partial right lung resection. EXAM: CT CHEST, ABDOMEN, AND PELVIS WITH CONTRAST TECHNIQUE: Multidetector CT imaging of the chest, abdomen and pelvis was performed following the standard protocol during bolus administration of intravenous contrast. CONTRAST:  133m OMNIPAQUE IOHEXOL 300 MG/ML  SOLN COMPARISON:  CT CAP 11/18/2014. FINDINGS: CT CHEST FINDINGS Mediastinum/Nodes: No enlarged axillary, mediastinal or hilar lymphadenopathy. Normal heart size. No pericardial effusion. Coronary arterial vascular calcifications. Persistent dilatation of the main pulmonary artery suggestive of pulmonary arterial hypertension. Lungs/Pleura: Central airways are patent. There is increased volume loss within the right lung, particularly the right lower lobe with retraction and increased irregular consolidation likely post radiation changes. Additionally there is increased consolidation with central cavitation within the right upper lobe, measuring 3.6 x 2.7 cm (image 17; series 4). Additional previously described subpleural consolidation within the right upper lobe is slightly decreased when compared to prior examination measuring 2.6 x 2.7 cm, previously 3.1 x 2.7 cm (image 11; series 4). Unchanged 3 mm left lower lobe nodule (image 25; series 4). Additionally there is a new 4 mm nodule within the left upper lobe (image 9; series 4). No pleural effusion or pneumothorax. Musculoskeletal: Interval increase in size of lytic lesion involving the right aspect of the T3 vertebral body measuring 1.5 cm (image 49; series  602). CT ABDOMEN PELVIS FINDINGS Hepatobiliary: Liver is normal in size and contour. No focal hepatic lesion is identified. Fatty deposition adjacent to the falciform ligament. Status post cholecystectomy. Pancreas: Re- demonstrated multiple parenchymal calcifications within the pancreas. 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 Interval decrease in size of previously described cystic lesion within the pancreatic uncinate process. This is nearly completely resolved. Spleen: Unremarkable Adrenals/Urinary Tract: Adrenal glands are normal. Unchanged bilateral renal hypodensities. No hydronephrosis. The urinary bladder is unremarkable. Stomach/Bowel: No abnormal bowel wall thickening or evidence for bowel obstruction. No free fluid or free intraperitoneal air. Vascular/Lymphatic: Peripheral calcified atherosclerotic plaque. There is a 4.8 x 4.0 cm infrarenal abdominal aortic aneurysm which is stable to minimally increased in size from prior exam where it measured 3.9 x 4.7 cm. When compared to prior there is significant increase mural thrombus. Short segment occlusion of the left external iliac artery. Other: Patient status post hysterectomy.  No adnexal mass. Musculoskeletal: No aggressive or acute appearing osseous lesions. IMPRESSION: Interval increase in mural thrombus involving infrarenal abdominal aortic aneurysm measuring up to 4.8 cm in size, minimally  increased from prior exam. Given the interval change in appearance and relatively short time frame, vascular surgically consultation is recommended if not previously performed. Slight interval decrease in size of soft tissue mass adjacent to the pleural space within the right upper lobe. Otherwise there has been significant interval increase in surrounding irregular consolidation within the right upper and right lower lobes. These findings are may be secondary to posttreatment/radiation change however increasing/worsening disease is not  excluded. Recommend attention on followup. Interval progression of lytic metastasis/ direct extension of tumor within the right aspect of the T3 vertebral body. Stable left lower lobe pulmonary nodule. Interval development of a new left upper lobe pulmonary nodule. This is nonspecific and may be infectious/ inflammatory in etiology. Metastasis not excluded. Recommend attention on followup. These results will be called to the ordering clinician or representative by the Radiologist Assistant, and communication documented in the PACS or zVision Dashboard. Electronically Signed   By: Lovey Newcomer M.D.   On: 03/03/2015 16:45    ASSESSMENT AND PLAN: This is a very pleasant 74 years old with metastatic non-small cell lung cancer, adenocarcinoma with positive EGFR mutation who is currently undergoing treatment with Tarceva 150 mg by mouth daily status post 8 months of treatment. The patient is tolerating her treatment fairly well with no significant adverse effects except for diarrhea and mid back pain. The recent CT scan of the chest, abdomen and pelvis showed no significant evidence for disease progression except for new 4 mm pulmonary nodule and questionable tumor extension to the T3 vertebral body. I discussed the scan results with the patient and her family. I recommended for her to continue her current treatment with Tarceva with the same dose for now. I will refer the patient back to Dr. Pablo Ledger for reevaluation and consideration of palliative radiotherapy to the T3 vertebral body lesion. For the increase in the mural thrombus involving the infrarenal abdominal aortic aneurysm, I will refer the patient to vascular surgery for evaluation. For the swelling of the right lower extremity, I will order Doppler of the right lower extremity to rule out deep venous thrombosis. For the anemia, I recommended for the patient to take her oral iron tablet on daily basis. The patient would come back for follow-up visit  in one month for reevaluation with repeat blood work. She was advised to call immediately if she has any concerning symptoms in the interval. The patient voices understanding of current disease status and treatment options and is in agreement with the current care plan.  All questions were answered. The patient knows to call the clinic with any problems, questions or concerns. We can certainly see the patient much sooner if necessary.  Disclaimer: This note was dictated with voice recognition software. Similar sounding words can inadvertently be transcribed and may not be corrected upon review.

## 2015-03-05 NOTE — Progress Notes (Signed)
   Department of Radiation Oncology  Phone:  [phone] Fax:        [phone]   Name: [name] MRN: [id number]  DOB: 1941-01-25  Date: 03/06/2015  Follow Up Visit Note  Diagnosis: Lung cancer, upper lobe ([place])   Staging form: Lung, AJCC 7th Edition     Clinical stage from 05/16/2014: Stage IV (T3, N0, M1a) - Signed by Curt Bears, MD on 05/16/2014   Summary and Interval since last radiation: 12/24/2014-01/06/2015 Site/dose:   Right upper lobe/ 30 Gy in 10 fractions at 3 Gy per fraction  Interval History: [name] presents today for routine followup.  Her recent CT showed a goo response to radiation in the right lung.  There was progression of disease at T3 with the lesion now measuring 1.5 cm. She is tolerating her Tarceva well. She is tired. She takes tylenol at night which partially controls her pain. She has taken tramadol in the past and tolerated that well. She has poor appetite and many foods do not sit well with her stomach. She has an appointment with vascular surgery regarding her aortic aneurysm in a few weeks She is accompanied by multiple family members.   Physical Exam:  Filed Vitals:   03/06/15 1034  BP: 118/63  Pulse: 81  Temp: 98.4 F (36.9 C)  TempSrc: Oral  Resp: 20  Weight: 104 lb 11.2 oz (47.492 kg)  SpO2: 100%   Pleasant female. Kyphotic. In a wheel chair. Normal respiratory effort.   IMPRESSION: [name] is a 74 y.o. female s/p RT for right hilar mass which is improved but with progressive disease at T3.   PLAN:  We discussed palliative treatment of her spine. This is easily planned and accomplished but I worry that she could have dehydration and more weight loss leading to pneumonia or a UTI if she gets even mild esophagitis from radiation. We discussed no treatment and the possibility of tumor growth and increased pain or pressure of her spinal cord. We discussed continuing Tarceva and waiting for the next scan.   Ultimately she decided to proceed with  treatment after the holidays which will give her time to increase her strength.  She will call us for a sim appointment .     Thea Silversmith, MD  This document serves as a record of services personally performed by Thea Silversmith, MD. It was created on her behalf by Arlyce Harman, a trained medical scribe. The creation of this record is based on the scribe's personal observations and the provider's statements to them. This document has been checked and approved by the attending provider.

## 2015-03-06 ENCOUNTER — Ambulatory Visit
Admission: RE | Admit: 2015-03-06 | Discharge: 2015-03-06 | Disposition: A | Discharge status: Home / Self Care | Payer: PPO | Source: Ambulatory Visit | Attending: Radiation Oncology | Admitting: Radiation Oncology

## 2015-03-06 ENCOUNTER — Encounter: Payer: Self-pay | Admitting: Radiation Oncology

## 2015-03-06 VITALS — BP 118/63 | HR 81 | Temp 98.4°F | Resp 20 | Wt 104.7 lb

## 2015-03-06 DIAGNOSIS — C3411 Malignant neoplasm of upper lobe, right bronchus or lung: Secondary | ICD-10-CM | POA: Diagnosis present

## 2015-03-06 DIAGNOSIS — C3412 Malignant neoplasm of upper lobe, left bronchus or lung: Secondary | ICD-10-CM

## 2015-03-06 DIAGNOSIS — Z510 Encounter for antineoplastic radiation therapy: Secondary | ICD-10-CM | POA: Insufficient documentation

## 2015-03-06 MED ORDER — TRAMADOL HCL 50 MG PO TABS: 50.0000 mg | ORAL_TABLET | Freq: Two times a day (BID) | ORAL | Status: DC | PRN

## 2015-03-06 NOTE — Progress Notes (Signed)
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 Ivin Poot MD 1. Right VATS (video-assisted thoracoscopic surgery) with resection of     right upper lobe blebs. 2. 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 3. Pleurodesis, right hemithorax.  Past/Anticipated interventions by medical oncology, if any: Dr. Julien Nordmann last appointment 03/04/15, Tarceva 150 mg by mouth daily status post 8 months of treatment.  Signs/Symptoms  Weight changes, if any: yes reports approx 10-15 lb weight loss in last 6 months  Respiratory complaints, if any: yes- COPD, SOB- oxygen currently on 3 L via nasal cannula. Cough- productive for white sputum.  Occasional wheezing.  Hemoptysis, if any: no  Pain issues, if any:  Reports current headache pain of 3/10, top of head  SAFETY ISSUES:  Prior radiation Yes- Palliative radiotherapy to the enlarging right upper lobe lung mass under the care of           Dr. Pablo Ledger completed on 01/06/2015.  Pacemaker/ICD? no   Possible current pregnancy? no  Is the patient on methotrexate? no  Current Complaints / other details: Fatigue, Swelling of the Right lower extremity-Dr. Julien Nordmann ordered doppler. No embolism. Mid upper back pain. Thyroid  Disease, pernicious anemia, hypertension. Reports abdominal aneurysm- has an appointment for later this month to address.  BP 118/63 mmHg  Pulse 81  Temp(Src) 98.4 F (36.9 C) (Oral)  Resp 20  Wt 104 lb 11.2 oz (47.492 kg)  SpO2 100% Wt Readings from Last 3 Encounters:  03/06/15 104 lb 11.2 oz (47.492 kg)  03/04/15  105 lb 3.2 oz (47.718 kg)  01/27/15 105 lb 11.2 oz (47.945 kg)

## 2015-03-11 ENCOUNTER — Encounter: Payer: Self-pay | Admitting: Surgery

## 2015-03-17 ENCOUNTER — Ambulatory Visit (INDEPENDENT_AMBULATORY_CARE_PROVIDER_SITE_OTHER): Payer: PPO | Admitting: Surgery

## 2015-03-17 ENCOUNTER — Encounter: Payer: Self-pay | Admitting: Surgery

## 2015-03-17 VITALS — BP 108/63 | HR 93 | Ht 64.0 in | Wt 98.6 lb

## 2015-03-17 DIAGNOSIS — I714 Abdominal aortic aneurysm, without rupture, unspecified: Secondary | ICD-10-CM

## 2015-03-17 NOTE — Addendum Note (Signed)
Addended by: Dorthula Rue L on: 03/17/2015 03:57 PM   Modules accepted: Orders

## 2015-03-17 NOTE — Progress Notes (Signed)
Patient name: [name] MRN: [id number] DOB: 1940/11/15 Sex: female   Referred by: Dr. Julien Nordmann  Reason for referral:  Chief Complaint  Patient presents with  . New Evaluation    eval AAA recent CT [facility] - c/o R abdominal pain     HISTORY OF PRESENT ILLNESS: This is a 74 year old female who is referred today for evaluation of an abdominal aortic aneurysm.  This was detected on CT scan.  The patient suffers from recurrent non-small cell lung cancer.  She has previously undergone palliative radiotherapy to the right upper lobe mass.  She is considering palliative treatment to a T3 vertebral body lesion.  Her aneurysm on imaging studies was 4.8 cm in maximum diameter.  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  She denies any claudication symptoms.  The patient suffers from COPD secondary to chronic tobacco use.  She is medically managed for hypertension.  She also has chronic kidney disease.  Past Medical History  Diagnosis Date  . COPD (chronic obstructive pulmonary disease) ([place])   . Thyroid disease   . Pernicious anemia   . Hypertension   . Lung cancer ([place])   . AAA (abdominal aortic aneurysm) ([place])   . Chronic kidney disease   . Stroke [facility])     Past Surgical History  Procedure Laterality Date  . Abdominal hysterectomy    . Appendectomy    . Cholecystectomy    . Tubal ligation    . Chest tube insertion  03/08/2012    Procedure: CHEST TUBE INSERTION;  Surgeon: Ivin Poot, MD;  Location: [place];  Service: Thoracic;  Laterality: Right;  . Video assisted thoracoscopy  03/17/2012    Procedure: VIDEO ASSISTED THORACOSCOPY;  Surgeon: Ivin Poot, MD;  Location: [place];  Service: Thoracic;  Laterality: Right;  . Resection of apical bleb  03/17/2012    Procedure: RESECTION OF APICAL BLEB;  Surgeon: Ivin Poot, MD;  Location: [place];  Service: Thoracic;  Laterality: Right;  stapling of bleb  . Colonoscopy N/A 04/29/2014    Procedure: COLONOSCOPY;   Surgeon: Daneil Dolin, MD;  [facility];  Service: Endoscopy;  Laterality: N/A;  1245pm  . Esophagogastroduodenoscopy N/A 04/29/2014    Procedure: ESOPHAGOGASTRODUODENOSCOPY (EGD);  Surgeon: Daneil Dolin, MD;  [facility];  Service: Endoscopy;  Laterality: N/A;  . Savory dilation N/A 04/29/2014    Procedure: SAVORY DILATION;  Surgeon: Daneil Dolin, MD;  [facility];  Service: Endoscopy;  Laterality: N/A;  Venia Minks dilation N/A 04/29/2014    Procedure: Venia Minks DILATION;  Surgeon: Daneil Dolin, MD;  [facility];  Service: Endoscopy;  Laterality: N/A;  . Tee without cardioversion N/A 11/15/2014    Procedure: TRANSESOPHAGEAL ECHOCARDIOGRAM (TEE);  Surgeon: Sueanne Margarita, MD;  Location: [facility];  Service: Cardiovascular;  Laterality: N/A;    Social History   Social History  . Marital Status: Married    Spouse Name: N/A  . Number of Children: 4  . Years of Education: N/A   Occupational History  . Not on file.   Social History Main Topics  . Smoking status: Former Smoker    Types: Cigarettes    Quit date: 04/05/2006  . Smokeless tobacco: Former Systems developer    Quit date: 03/07/2006  . Alcohol Use: No  . Drug Use: No  . Sexual Activity: Not on file   Other Topics Concern  . Not on  file   Social History Narrative    Family History  Problem Relation Age of Onset  . Cancer Mother   . Heart failure Brother   . Heart disease Brother     before age 32  . Colon cancer Neg Hx     Allergies as of 03/17/2015 - Review Complete 03/17/2015  Allergen Reaction Noted  . Codeine Nausea And Vomiting 11/04/2011  . Penicillins Rash 11/04/2011  . Pravastatin sodium Other (See Comments) 11/15/2014    Current Outpatient Prescriptions on File Prior to Visit  Medication Sig Dispense Refill  . acetaminophen (TYLENOL) 500 MG tablet Take 500-1,000 mg by mouth every 6 (six) hours as needed for moderate pain.     Marland Kitchen albuterol (PROVENTIL) (2.5  MG/3ML) 0.083% nebulizer solution Take 2.5 mg by nebulization every 4 (four) hours. Or Four times daily    . ALPRAZolam (XANAX) 1 MG tablet Take 1 mg by mouth 4 (four) times daily as needed. For anxiety and/or sleep    . aspirin EC 81 MG EC tablet Take 1 tablet (81 mg total) by mouth daily.    . clindamycin (CLEOCIN T) 1 % external solution Apply topically 2 (two) times daily. (Patient taking differently: Apply 1 application topically as needed. ) 30 mL 1  . Cyanocobalamin (VITAMIN B-12) 1000 MCG SUBL Place 1 tablet under the tongue daily.    Marland Kitchen doxycycline (VIBRA-TABS) 100 MG tablet Take 1 tablet (100 mg total) by mouth 2 (two) times daily. 14 tablet 0  . ferrous sulfate 325 (65 FE) MG tablet Take 325 mg by mouth 3 (three) times daily with meals.    . Fluticasone-Salmeterol (ADVAIR) 250-50 MCG/DOSE AEPB Inhale 1 puff into the lungs every 12 (twelve) hours.    Marland Kitchen guaiFENesin (MUCINEX) 600 MG 12 hr tablet Take 600 mg by mouth daily as needed for cough.     . levothyroxine (SYNTHROID, LEVOTHROID) 100 MCG tablet Take 100 mcg by mouth daily before breakfast.     . loperamide (IMODIUM) 2 MG capsule Take 2 mg by mouth as needed for diarrhea or loose stools.    Marland Kitchen loratadine (CLARITIN) 10 MG tablet Take 10 mg by mouth daily as needed for allergies.     . mirtazapine (REMERON) 7.5 MG tablet Take 7.5 mg by mouth at bedtime.  12  . NON FORMULARY OXYGEN      24/7    . potassium chloride SA (K-DUR,KLOR-CON) 20 MEQ tablet Take 1 tablet (20 mEq total) by mouth daily. 7 tablet 0  . TARCEVA 150 MG tablet TAKE 1 TABLET BY MOUTH DAILY. TAKE ON AN EMPTY STOMACH 1 HOUR BEFORE MEALS OR 2 HOURS AFTER. 30 tablet 2  . torsemide (DEMADEX) 20 MG tablet Take 10 mg by mouth daily as needed (fluid retention).     . traMADol (ULTRAM) 50 MG tablet Take 1 tablet (50 mg total) by mouth every 12 (twelve) hours as needed. 50 tablet 1  . Vitamin D, Ergocalciferol, (DRISDOL) 50000 UNITS CAPS capsule Take 50,000 Units by mouth every 30  (thirty) days. Fridays     No current facility-administered medications on file prior to visit.     REVIEW OF SYSTEMS: Cardiovascular: No chest pain, positive for pain with walking and leg swelling. Pulmonary: Positive shortness of breath.  Positive home oxygen.  Positive productive cough.  Occasional wheezing Neurologic: No weakness, paresthesias, aphasia, or amaurosis.  Positive dizziness. Hematologic: No bleeding problems or clotting disorders. Musculoskeletal: No joint pain or joint swelling. Gastrointestinal: No blood in stool  or hematemesis Genitourinary: No dysuria or hematuria. Psychiatric:: No history of major depression. Integumentary: No rashes or ulcers. Constitutional: No fever or chills.  PHYSICAL EXAMINATION:  Filed Vitals:   03/17/15 1352  BP: 108/63  Pulse: 93  Height: '5\' 4"'$  (1.626 m)  Weight: 98 lb 9.6 oz (44.725 kg)  SpO2: 99%   Body mass index is 16.92 kg/(m^2). General: The patient appears their stated age.   HEENT:  No gross abnormalities Pulmonary: Respirations are non-labored Abdomen: Soft and non-tender.  Palpable pulsatile mass  Musculoskeletal: There are no major deformities.   Neurologic: No focal weakness or paresthesias are detected, Skin: There are no ulcer or rashes noted. Psychiatric: The patient has normal affect. Cardiovascular: There is a regular rate and rhythm without significant murmur appreciated.  No carotid bruits  Diagnostic Studies: I have reviewed her CT scan which shows a 4.8 cm abdominal aortic aneurysm.  She also has an occluded left iliac artery    Assessment:  Abdominal aortic aneurysm Plan: I discussed with the patient the indications for repair.  Currently, I do not think that her aneurysm meet size criteria for repair.  In addition, with her lung cancer, I'm not sure I would offer her surgery anyway.  In order to treat her from a minimally invasive approach, given her occluded left iliac she would need an aorta  uni-iliac device with a right to left femoral-femoral bypass graft.  I think this is more that she can tolerate currently.  I have elected to have her return in 6 months with a repeat CT scan.  She was instructed to go to the [facility] emergency department should she develop severe abdominal pain.     Eldridge Abrahams, M.D. [facility] Office: [phone] Pager:  [phone]

## 2015-04-01 ENCOUNTER — Ambulatory Visit
Admission: RE | Admit: 2015-04-01 | Discharge: 2015-04-01 | Disposition: A | Discharge status: Home / Self Care | Payer: PPO | Source: Ambulatory Visit | Attending: Radiation Oncology | Admitting: Radiation Oncology

## 2015-04-01 ENCOUNTER — Encounter: Payer: Self-pay | Admitting: Radiation Oncology

## 2015-04-01 VITALS — BP 111/60 | HR 100 | Temp 97.6°F | Resp 20

## 2015-04-01 DIAGNOSIS — C3412 Malignant neoplasm of upper lobe, left bronchus or lung: Secondary | ICD-10-CM

## 2015-04-01 DIAGNOSIS — Z510 Encounter for antineoplastic radiation therapy: Secondary | ICD-10-CM | POA: Diagnosis not present

## 2015-04-01 NOTE — Progress Notes (Signed)
Name: JULY LINAM   MRN: [id number]  Date:  04/01/2015  DOB: December 14, 1940  Status: Outpatient   DIAGNOSIS: Metastatic cancer to spine  CONSENT VERIFIED: yes   SET UP: Patient is setup supine   IMMOBILIZATION:  The following immobilization was used: Alpha cradle  NARRATIVE:  The patient, Speas, was brought to the [place].  Identity was confirmed.  All relevant records and images related to the planned course of therapy were reviewed.  Then, the patient was positioned in a stable reproducible clinical set-up for radiation therapy.  CT images were obtained.  An isocenter was placed. Skin markings were placed.  The CT images were loaded into the planning software where the target and avoidance structures were contoured.  The radiation prescription was entered and confirmed. The patient was discharged in stable condition and tolerated simulation well.    TREATMENT PLANNING NOTE:  Treatment planning then occurred. I have requested : MLC's, isodose plan, basic dose calculation  I have requested 3 dimensional simulation with DVH of cord, kidneys, bowel and GTV  A total of 3 complex treatment devices will be used in the form of unique MLCS  This document serves as a record of services personally performed by Thea Silversmith, MD. It was created on her behalf by Darcus Austin, a trained medical scribe. The creation of this record is based on the scribe's personal observations and the provider's statements to them. This document has been checked and approved by the attending provider.

## 2015-04-01 NOTE — Progress Notes (Signed)
Vitals taken  After ct simulation, BP 111/60 mmHg  Pulse 100  Temp(Src) 97.6 F (36.4 C) (Oral)  Resp 20  SpO2 99%

## 2015-04-02 ENCOUNTER — Encounter: Payer: Self-pay | Admitting: Internal Medicine

## 2015-04-02 ENCOUNTER — Ambulatory Visit (HOSPITAL_COMMUNITY)
Admission: RE | Admit: 2015-04-02 | Discharge: 2015-04-02 | Disposition: A | Discharge status: Home / Self Care | Payer: PPO | Source: Ambulatory Visit | Attending: Internal Medicine | Admitting: Internal Medicine

## 2015-04-02 ENCOUNTER — Other Ambulatory Visit: Payer: Self-pay | Admitting: *Deleted

## 2015-04-02 ENCOUNTER — Ambulatory Visit (HOSPITAL_BASED_OUTPATIENT_CLINIC_OR_DEPARTMENT_OTHER): Payer: PPO | Admitting: Internal Medicine

## 2015-04-02 ENCOUNTER — Other Ambulatory Visit (HOSPITAL_BASED_OUTPATIENT_CLINIC_OR_DEPARTMENT_OTHER): Payer: PPO

## 2015-04-02 ENCOUNTER — Ambulatory Visit: Payer: PPO

## 2015-04-02 ENCOUNTER — Encounter: Payer: Self-pay | Admitting: *Deleted

## 2015-04-02 VITALS — BP 116/66 | HR 90 | Temp 97.9°F | Resp 17 | Ht 64.0 in | Wt 100.8 lb

## 2015-04-02 DIAGNOSIS — C3411 Malignant neoplasm of upper lobe, right bronchus or lung: Secondary | ICD-10-CM

## 2015-04-02 DIAGNOSIS — D649 Anemia, unspecified: Secondary | ICD-10-CM

## 2015-04-02 DIAGNOSIS — Z029 Encounter for administrative examinations, unspecified: Secondary | ICD-10-CM | POA: Insufficient documentation

## 2015-04-02 DIAGNOSIS — M7989 Other specified soft tissue disorders: Secondary | ICD-10-CM

## 2015-04-02 DIAGNOSIS — I713 Abdominal aortic aneurysm, ruptured, unspecified: Secondary | ICD-10-CM

## 2015-04-02 DIAGNOSIS — M549 Dorsalgia, unspecified: Secondary | ICD-10-CM

## 2015-04-02 DIAGNOSIS — Z5111 Encounter for antineoplastic chemotherapy: Secondary | ICD-10-CM

## 2015-04-02 LAB — CBC WITH DIFFERENTIAL/PLATELET
BASO%: 0.2 % (ref 0.0–2.0)
Basophils Absolute: 0 10*3/uL (ref 0.0–0.1)
EOS%: 2.4 % (ref 0.0–7.0)
Eosinophils Absolute: 0.1 10*3/uL (ref 0.0–0.5)
HCT: 25.1 % — ABNORMAL LOW (ref 34.8–46.6)
HGB: 7.6 g/dL — ABNORMAL LOW (ref 11.6–15.9)
LYMPH%: 17.5 % (ref 14.0–49.7)
MCH: 23.9 pg — ABNORMAL LOW (ref 25.1–34.0)
MCHC: 30.4 g/dL — ABNORMAL LOW (ref 31.5–36.0)
MCV: 78.7 fL — ABNORMAL LOW (ref 79.5–101.0)
MONO#: 0.5 10*3/uL (ref 0.1–0.9)
MONO%: 11.2 % (ref 0.0–14.0)
NEUT#: 3 10*3/uL (ref 1.5–6.5)
NEUT%: 68.7 % (ref 38.4–76.8)
Platelets: 435 10*3/uL — ABNORMAL HIGH (ref 145–400)
RBC: 3.19 10*6/uL — ABNORMAL LOW (ref 3.70–5.45)
RDW: 17.7 % — ABNORMAL HIGH (ref 11.2–14.5)
WBC: 4.4 10*3/uL (ref 3.9–10.3)
lymph#: 0.8 10*3/uL — ABNORMAL LOW (ref 0.9–3.3)

## 2015-04-02 LAB — ABO/RH: ABO/RH(D): A NEG

## 2015-04-02 LAB — COMPREHENSIVE METABOLIC PANEL
ALBUMIN: 1.8 g/dL — AB (ref 3.5–5.0)
ALK PHOS: 207 U/L — AB (ref 40–150)
ALT: 9 U/L (ref 0–55)
ANION GAP: 9 meq/L (ref 3–11)
AST: 12 U/L (ref 5–34)
BILIRUBIN TOTAL: 0.37 mg/dL (ref 0.20–1.20)
BUN: 15.1 mg/dL (ref 7.0–26.0)
CALCIUM: 7.6 mg/dL — AB (ref 8.4–10.4)
CO2: 23 mEq/L (ref 22–29)
Chloride: 107 mEq/L (ref 98–109)
Creatinine: 0.9 mg/dL (ref 0.6–1.1)
EGFR: 66 mL/min/{1.73_m2} — AB (ref >90)
GLUCOSE: 111 mg/dL (ref 70–140)
POTASSIUM: 3.6 meq/L (ref 3.5–5.1)
SODIUM: 139 meq/L (ref 136–145)
TOTAL PROTEIN: 4.8 g/dL — AB (ref 6.4–8.3)

## 2015-04-02 LAB — HOLD TUBE, BLOOD BANK

## 2015-04-02 LAB — PREPARE RBC (CROSSMATCH)

## 2015-04-02 NOTE — Progress Notes (Signed)
[place] [phone]   [phone]  OFFICE PROGRESS NOTE  Glo Herring., MD [place] [address]  DIAGNOSIS: Recurrent non-small cell lung cancer, adenocarcinoma with positive EGFR mutation in exon 21 ([id number]). The patient has multifocal disease including 2 nodules in the right lung with a suspicious tiny lesion in the left lung.  PRIOR THERAPY: Palliative radiotherapy to the enlarging right upper lobe lung mass under the care of Dr. Pablo Ledger completed on 01/06/2015.  CURRENT THERAPY:  1) Tarceva 150 mg by mouth daily status post 9 months of treatment.  INTERVAL HISTORY: [name] 74 y.o. female returns to the clinic today for follow-up visit accompanied by her daughter. The patient is feeling fine today with no specific complaints except for shortness of breath, and fatigue. She also has mild upper mid back pain. She was seen by Dr. Pablo Ledger and she is considered for palliative radiotherapy to the T3 lesion next week. She was also seen by vascular surgery for abdominal aortic aneurysm and she is currently on observation. The patient denied having any significant chest pain, cough or hemoptysis but continues to have shortness of breath and currently on home oxygen. She has no significant weight loss or night sweats. She is tolerating her treatment with oral Tarceva fairly well with no significant adverse effects except for mild diarrhea improved with Imodium. She also underwent Doppler of the lower extremity on 03/04/2015 and it showed no evidence for deep venous thrombosis.  MEDICAL HISTORY: Past Medical History  Diagnosis Date  . COPD (chronic obstructive pulmonary disease) ([place])   . Thyroid disease   . Pernicious anemia   . Hypertension   . Lung cancer ([place])   . AAA (abdominal aortic aneurysm) ([place])   . Chronic kidney disease   . Stroke [facility])     ALLERGIES:  is allergic to codeine; penicillins; and pravastatin  sodium.  MEDICATIONS:  Current Outpatient Prescriptions  Medication Sig Dispense Refill  . acetaminophen (TYLENOL) 500 MG tablet Take 500-1,000 mg by mouth every 6 (six) hours as needed for moderate pain.     Marland Kitchen albuterol (PROVENTIL) (2.5 MG/3ML) 0.083% nebulizer solution Take 2.5 mg by nebulization every 4 (four) hours. Or Four times daily    . ALPRAZolam (XANAX) 1 MG tablet Take 1 mg by mouth 4 (four) times daily as needed. For anxiety and/or sleep    . aspirin EC 81 MG EC tablet Take 1 tablet (81 mg total) by mouth daily.    . clindamycin (CLEOCIN T) 1 % external solution Apply topically 2 (two) times daily. (Patient taking differently: Apply 1 application topically as needed. ) 30 mL 1  . Cyanocobalamin (VITAMIN B-12) 1000 MCG SUBL Place 1 tablet under the tongue daily.    . ferrous sulfate 325 (65 FE) MG tablet Take 325 mg by mouth 3 (three) times daily with meals.    . Fluticasone-Salmeterol (ADVAIR) 250-50 MCG/DOSE AEPB Inhale 1 puff into the lungs every 12 (twelve) hours.    Marland Kitchen guaiFENesin (MUCINEX) 600 MG 12 hr tablet Take 600 mg by mouth daily as needed for cough.     . levothyroxine (SYNTHROID, LEVOTHROID) 100 MCG tablet Take 100 mcg by mouth daily before breakfast.     . loperamide (IMODIUM) 2 MG capsule Take 2 mg by mouth as needed for diarrhea or loose stools.    Marland Kitchen loratadine (CLARITIN) 10 MG tablet Take 10 mg by mouth daily as needed for allergies.     Marland Kitchen  mirtazapine (REMERON) 7.5 MG tablet Take 7.5 mg by mouth at bedtime.  12  . NON FORMULARY OXYGEN      24/7    . potassium chloride SA (K-DUR,KLOR-CON) 20 MEQ tablet Take 1 tablet (20 mEq total) by mouth daily. 7 tablet 0  . TARCEVA 150 MG tablet TAKE 1 TABLET BY MOUTH DAILY. TAKE ON AN EMPTY STOMACH 1 HOUR BEFORE MEALS OR 2 HOURS AFTER. 30 tablet 2  . traMADol (ULTRAM) 50 MG tablet Take 1 tablet (50 mg total) by mouth every 12 (twelve) hours as needed. 50 tablet 1  . Vitamin D, Ergocalciferol, (DRISDOL) 50000 UNITS CAPS capsule  Take 50,000 Units by mouth every 30 (thirty) days. Fridays     No current facility-administered medications for this visit.    SURGICAL HISTORY:  Past Surgical History  Procedure Laterality Date  . Abdominal hysterectomy    . Appendectomy    . Cholecystectomy    . Tubal ligation    . Chest tube insertion  03/08/2012    Procedure: CHEST TUBE INSERTION;  Surgeon: Ivin Poot, MD;  Location: [place] [facility];  Service: Thoracic;  Laterality: Right;  . Video assisted thoracoscopy  03/17/2012    Procedure: VIDEO ASSISTED THORACOSCOPY;  Surgeon: Ivin Poot, MD;  Location: [place];  Service: Thoracic;  Laterality: Right;  . Resection of apical bleb  03/17/2012    Procedure: RESECTION OF APICAL BLEB;  Surgeon: Ivin Poot, MD;  Location: [place];  Service: Thoracic;  Laterality: Right;  stapling of bleb  . Colonoscopy N/A 04/29/2014    Procedure: COLONOSCOPY;  Surgeon: Daneil Dolin, MD;  [facility];  Service: Endoscopy;  Laterality: N/A;  1245pm  . Esophagogastroduodenoscopy N/A 04/29/2014    Procedure: ESOPHAGOGASTRODUODENOSCOPY (EGD);  Surgeon: Daneil Dolin, MD;  [facility];  Service: Endoscopy;  Laterality: N/A;  . [place] dilation N/A 04/29/2014    Procedure: SAVORY DILATION;  Surgeon: Daneil Dolin, MD;  [facility];  Service: Endoscopy;  Laterality: N/A;  Venia Minks dilation N/A 04/29/2014    Procedure: Venia Minks DILATION;  Surgeon: Daneil Dolin, MD;  [facility];  Service: Endoscopy;  Laterality: N/A;  . Tee without cardioversion N/A 11/15/2014    Procedure: TRANSESOPHAGEAL ECHOCARDIOGRAM (TEE);  Surgeon: Sueanne Margarita, MD;  Location: [facility];  Service: Cardiovascular;  Laterality: N/A;    REVIEW OF SYSTEMS:  Constitutional: positive for fatigue Eyes: negative Ears, nose, mouth, throat, and face: negative Respiratory: positive for dyspnea on exertion Cardiovascular: negative Gastrointestinal: positive for  diarrhea Genitourinary:negative Integument/breast: negative Hematologic/lymphatic: negative Musculoskeletal:positive for back pain and muscle weakness Neurological: negative Behavioral/Psych: negative Endocrine: negative Allergic/Immunologic: negative   PHYSICAL EXAMINATION: General appearance: alert, cooperative, fatigued and no distress Head: Normocephalic, without obvious abnormality, atraumatic Neck: no adenopathy, no JVD, supple, symmetrical, trachea midline and thyroid not enlarged, symmetric, no tenderness/mass/nodules Lymph nodes: Cervical, supraclavicular, and axillary nodes normal. Resp: clear to auscultation bilaterally Back: symmetric, no curvature. ROM normal. No CVA tenderness. Cardio: regular rate and rhythm, S1, S2 normal, no murmur, click, rub or gallop GI: soft, non-tender; bowel sounds normal; no masses,  no organomegaly Extremities: extremities normal, atraumatic, no cyanosis or edema Neurologic: Alert and oriented X 3, normal strength and tone. Normal symmetric reflexes. Normal coordination and gait  ECOG PERFORMANCE STATUS: 2 - Symptomatic, <50% confined to bed  Blood pressure 116/66, pulse 90, temperature 97.9 F (36.6 C), temperature source Oral, resp. rate 17, height _0  (1.626 m),  weight 100 lb 12.8 oz (45.723 kg), SpO2 100 %.  LABORATORY DATA: Lab Results  Component Value Date   WBC 4.4 04/02/2015   HGB 7.6* 04/02/2015   HCT 25.1* 04/02/2015   MCV 78.7* 04/02/2015   PLT 435* 04/02/2015      Chemistry      Component Value Date/Time   NA 139 04/02/2015 0919   NA 140 11/13/2014 1744   K 3.6 04/02/2015 0919   K 3.1* 11/13/2014 1744   CL 103 11/13/2014 1744   CO2 23 04/02/2015 0919   CO2 26 11/13/2014 1736   BUN 15.1 04/02/2015 0919   BUN 16 11/13/2014 1744   CREATININE 0.9 04/02/2015 0919   CREATININE 1.00 11/13/2014 1744   CREATININE 1.13* 03/14/2013 1210      Component Value Date/Time   CALCIUM 7.6* 04/02/2015 0919   CALCIUM 8.7*  11/13/2014 1736   ALKPHOS 207* 04/02/2015 0919   ALKPHOS 118 11/13/2014 1736   AST 12 04/02/2015 0919   AST 17 11/13/2014 1736   ALT 9 04/02/2015 0919   ALT 11* 11/13/2014 1736   BILITOT 0.37 04/02/2015 0919   BILITOT 0.7 11/13/2014 1736       RADIOGRAPHIC STUDIES: Ct Chest W Contrast  03/03/2015  CLINICAL DATA:  Patient with history of right lung cancer diagnosed 2013 status post partial right lung resection. EXAM: CT CHEST, ABDOMEN, AND PELVIS WITH CONTRAST TECHNIQUE: Multidetector CT imaging of the chest, abdomen and pelvis was performed following the standard protocol during bolus administration of intravenous contrast. CONTRAST:  153m OMNIPAQUE IOHEXOL 300 MG/ML  SOLN COMPARISON:  CT CAP 11/18/2014. FINDINGS: CT CHEST FINDINGS Mediastinum/Nodes: No enlarged axillary, mediastinal or hilar lymphadenopathy. Normal heart size. No pericardial effusion. Coronary arterial vascular calcifications. Persistent dilatation of the main pulmonary artery suggestive of pulmonary arterial hypertension. Lungs/Pleura: Central airways are patent. There is increased volume loss within the right lung, particularly the right lower lobe with retraction and increased irregular consolidation likely post radiation changes. Additionally there is increased consolidation with central cavitation within the right upper lobe, measuring 3.6 x 2.7 cm (image 17; series 4). Additional previously described subpleural consolidation within the right upper lobe is slightly decreased when compared to prior examination measuring 2.6 x 2.7 cm, previously 3.1 x 2.7 cm (image 11; series 4). Unchanged 3 mm left lower lobe nodule (image 25; series 4). Additionally there is a new 4 mm nodule within the left upper lobe (image 9; series 4). No pleural effusion or pneumothorax. Musculoskeletal: Interval increase in size of lytic lesion involving the right aspect of the T3 vertebral body measuring 1.5 cm (image 49; series 602). CT ABDOMEN PELVIS  FINDINGS Hepatobiliary: Liver is normal in size and contour. No focal hepatic lesion is identified. Fatty deposition adjacent to the falciform ligament. Status post cholecystectomy. Pancreas: Re- demonstrated multiple parenchymal calcifications within the pancreas. 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 Interval decrease in size of previously described cystic lesion within the pancreatic uncinate process. This is nearly completely resolved. Spleen: Unremarkable Adrenals/Urinary Tract: Adrenal glands are normal. Unchanged bilateral renal hypodensities. No hydronephrosis. The urinary bladder is unremarkable. Stomach/Bowel: No abnormal bowel wall thickening or evidence for bowel obstruction. No free fluid or free intraperitoneal air. Vascular/Lymphatic: Peripheral calcified atherosclerotic plaque. There is a 4.8 x 4.0 cm infrarenal abdominal aortic aneurysm which is stable to minimally increased in size from prior exam where it measured 3.9 x 4.7 cm. When compared to prior there is significant increase mural thrombus. Short segment occlusion of  the left external iliac artery. Other: Patient status post hysterectomy.  No adnexal mass. Musculoskeletal: No aggressive or acute appearing osseous lesions. IMPRESSION: Interval increase in mural thrombus involving infrarenal abdominal aortic aneurysm measuring up to 4.8 cm in size, minimally increased from prior exam. Given the interval change in appearance and relatively short time frame, vascular surgically consultation is recommended if not previously performed. Slight interval decrease in size of soft tissue mass adjacent to the pleural space within the right upper lobe. Otherwise there has been significant interval increase in surrounding irregular consolidation within the right upper and right lower lobes. These findings are may be secondary to posttreatment/radiation change however increasing/worsening disease is not excluded. Recommend  attention on followup. Interval progression of lytic metastasis/ direct extension of tumor within the right aspect of the T3 vertebral body. Stable left lower lobe pulmonary nodule. Interval development of a new left upper lobe pulmonary nodule. This is nonspecific and may be infectious/ inflammatory in etiology. Metastasis not excluded. Recommend attention on followup. These results will be called to the ordering clinician or representative by the Radiologist Assistant, and communication documented in the PACS or zVision Dashboard. Electronically Signed   By: Lovey Newcomer M.D.   On: 03/03/2015 16:45   Ct Abdomen Pelvis W Contrast  03/03/2015  CLINICAL DATA:  Patient with history of right lung cancer diagnosed 2013 status post partial right lung resection. EXAM: CT CHEST, ABDOMEN, AND PELVIS WITH CONTRAST TECHNIQUE: Multidetector CT imaging of the chest, abdomen and pelvis was performed following the standard protocol during bolus administration of intravenous contrast. CONTRAST:  167m OMNIPAQUE IOHEXOL 300 MG/ML  SOLN COMPARISON:  CT CAP 11/18/2014. FINDINGS: CT CHEST FINDINGS Mediastinum/Nodes: No enlarged axillary, mediastinal or hilar lymphadenopathy. Normal heart size. No pericardial effusion. Coronary arterial vascular calcifications. Persistent dilatation of the main pulmonary artery suggestive of pulmonary arterial hypertension. Lungs/Pleura: Central airways are patent. There is increased volume loss within the right lung, particularly the right lower lobe with retraction and increased irregular consolidation likely post radiation changes. Additionally there is increased consolidation with central cavitation within the right upper lobe, measuring 3.6 x 2.7 cm (image 17; series 4). Additional previously described subpleural consolidation within the right upper lobe is slightly decreased when compared to prior examination measuring 2.6 x 2.7 cm, previously 3.1 x 2.7 cm (image 11; series 4). Unchanged 3 mm  left lower lobe nodule (image 25; series 4). Additionally there is a new 4 mm nodule within the left upper lobe (image 9; series 4). No pleural effusion or pneumothorax. Musculoskeletal: Interval increase in size of lytic lesion involving the right aspect of the T3 vertebral body measuring 1.5 cm (image 49; series 602). CT ABDOMEN PELVIS FINDINGS Hepatobiliary: Liver is normal in size and contour. No focal hepatic lesion is identified. Fatty deposition adjacent to the falciform ligament. Status post cholecystectomy. Pancreas: Re- demonstrated multiple parenchymal calcifications within the pancreas. 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 Interval decrease in size of previously described cystic lesion within the pancreatic uncinate process. This is nearly completely resolved. Spleen: Unremarkable Adrenals/Urinary Tract: Adrenal glands are normal. Unchanged bilateral renal hypodensities. No hydronephrosis. The urinary bladder is unremarkable. Stomach/Bowel: No abnormal bowel wall thickening or evidence for bowel obstruction. No free fluid or free intraperitoneal air. Vascular/Lymphatic: Peripheral calcified atherosclerotic plaque. There is a 4.8 x 4.0 cm infrarenal abdominal aortic aneurysm which is stable to minimally increased in size from prior exam where it measured 3.9 x 4.7 cm. When compared to prior there is  significant increase mural thrombus. Short segment occlusion of the left external iliac artery. Other: Patient status post hysterectomy.  No adnexal mass. Musculoskeletal: No aggressive or acute appearing osseous lesions. IMPRESSION: Interval increase in mural thrombus involving infrarenal abdominal aortic aneurysm measuring up to 4.8 cm in size, minimally increased from prior exam. Given the interval change in appearance and relatively short time frame, vascular surgically consultation is recommended if not previously performed. Slight interval decrease in size of soft tissue mass  adjacent to the pleural space within the right upper lobe. Otherwise there has been significant interval increase in surrounding irregular consolidation within the right upper and right lower lobes. These findings are may be secondary to posttreatment/radiation change however increasing/worsening disease is not excluded. Recommend attention on followup. Interval progression of lytic metastasis/ direct extension of tumor within the right aspect of the T3 vertebral body. Stable left lower lobe pulmonary nodule. Interval development of a new left upper lobe pulmonary nodule. This is nonspecific and may be infectious/ inflammatory in etiology. Metastasis not excluded. Recommend attention on followup. These results will be called to the ordering clinician or representative by the Radiologist Assistant, and communication documented in the PACS or zVision Dashboard. Electronically Signed   By: Lovey Newcomer M.D.   On: 03/03/2015 16:45    ASSESSMENT AND PLAN: This is a very pleasant 74 years old with metastatic non-small cell lung cancer, adenocarcinoma with positive EGFR mutation who is currently undergoing treatment with Tarceva 150 mg by mouth daily status post 9 months of treatment. The patient is tolerating her treatment fairly well with no significant adverse effects except for diarrhea and mid back pain. I recommended for the patient to continue her treatment with Tarceva with the same dose for now. For the back pain, she is expected to start palliative radiotherapy to the T3 lesion next week. For the anemia, I recommended for the patient to take her oral iron tablet on daily basis. I will also arrange for the patient to receive 2 units of PRBCs transfusion this week. The patient would come back for follow-up visit in one month for reevaluation with repeat blood work. She was advised to call immediately if she has any concerning symptoms in the interval. The patient voices understanding of current disease  status and treatment options and is in agreement with the current care plan.  All questions were answered. The patient knows to call the clinic with any problems, questions or concerns. We can certainly see the patient much sooner if necessary.  Disclaimer: This note was dictated with voice recognition software. Similar sounding words can inadvertently be transcribed and may not be corrected upon review.

## 2015-04-02 NOTE — Progress Notes (Signed)
Oncology Nurse Navigator Documentation  Oncology Nurse Navigator Flowsheets 04/02/2015  Navigator Encounter Type Clinic/MDC/spoke with patient and daughter today at cancer center.  She is receiving radiation to bone lesion and continue on oral biologic.  Questions address by Dr. Julien Nordmann.  No barriers identified.   Patient Visit Type Medonc  Treatment Phase Treatment  Barriers/Navigation Needs No barriers at this time  Interventions -  Time Spent with Patient 15

## 2015-04-04 ENCOUNTER — Ambulatory Visit (HOSPITAL_BASED_OUTPATIENT_CLINIC_OR_DEPARTMENT_OTHER): Payer: PPO

## 2015-04-04 VITALS — BP 139/83 | HR 77 | Temp 98.0°F | Resp 20

## 2015-04-04 DIAGNOSIS — D649 Anemia, unspecified: Secondary | ICD-10-CM

## 2015-04-04 DIAGNOSIS — Z029 Encounter for administrative examinations, unspecified: Secondary | ICD-10-CM | POA: Diagnosis not present

## 2015-04-04 MED ORDER — DIPHENHYDRAMINE HCL 25 MG PO CAPS
ORAL_CAPSULE | ORAL | Status: AC
  Filled 2015-04-04: qty 1

## 2015-04-04 MED ORDER — ACETAMINOPHEN 325 MG PO TABS
ORAL_TABLET | ORAL | Status: AC
  Filled 2015-04-04: qty 2

## 2015-04-04 MED ORDER — SODIUM CHLORIDE 0.9 % IV SOLN
250.0000 mL | Freq: Once | INTRAVENOUS | Status: AC
  Administered 2015-04-04: 250 mL via INTRAVENOUS

## 2015-04-04 MED ORDER — DIPHENHYDRAMINE HCL 25 MG PO CAPS
25.0000 mg | ORAL_CAPSULE | Freq: Once | ORAL | Status: AC
  Administered 2015-04-04: 25 mg via ORAL

## 2015-04-04 MED ORDER — ACETAMINOPHEN 325 MG PO TABS
650.0000 mg | ORAL_TABLET | Freq: Once | ORAL | Status: AC
  Administered 2015-04-04: 650 mg via ORAL

## 2015-04-04 NOTE — Patient Instructions (Signed)

## 2015-04-06 LAB — TYPE AND SCREEN
ABO/RH(D): A NEG
ANTIBODY SCREEN: NEGATIVE
Unit division: 0
Unit division: 0

## 2015-04-08 ENCOUNTER — Telehealth: Payer: Self-pay | Admitting: *Deleted

## 2015-04-08 NOTE — Telephone Encounter (Signed)
VM message from patient regarding f/u appts with Dr. Julien Nordmann and for radiation to T3.

## 2015-04-09 ENCOUNTER — Ambulatory Visit: Payer: PPO | Admitting: Radiation Oncology

## 2015-04-09 NOTE — Telephone Encounter (Signed)
Note to [place]. Pt does not have any other appts.

## 2015-04-10 ENCOUNTER — Ambulatory Visit: Payer: PPO

## 2015-04-10 ENCOUNTER — Other Ambulatory Visit: Payer: Self-pay | Admitting: Medical Oncology

## 2015-04-10 DIAGNOSIS — C3411 Malignant neoplasm of upper lobe, right bronchus or lung: Secondary | ICD-10-CM

## 2015-04-11 ENCOUNTER — Telehealth: Payer: Self-pay | Admitting: Internal Medicine

## 2015-04-11 ENCOUNTER — Ambulatory Visit: Payer: PPO

## 2015-04-11 NOTE — Telephone Encounter (Signed)
Called and left a message with 1/26 appointment

## 2015-04-14 ENCOUNTER — Ambulatory Visit: Payer: PPO

## 2015-04-15 ENCOUNTER — Ambulatory Visit: Payer: PPO

## 2015-04-16 ENCOUNTER — Ambulatory Visit: Payer: PPO

## 2015-04-17 ENCOUNTER — Ambulatory Visit: Payer: PPO

## 2015-04-17 MED FILL — TARCEVA 150 MG TABLET: 150 | 30 days supply | Qty: 30 | Fill #2

## 2015-04-18 ENCOUNTER — Ambulatory Visit: Payer: PPO

## 2015-04-21 ENCOUNTER — Ambulatory Visit: Payer: PPO

## 2015-04-22 ENCOUNTER — Ambulatory Visit: Payer: PPO

## 2015-04-23 ENCOUNTER — Ambulatory Visit: Payer: PPO

## 2015-04-25 DIAGNOSIS — J449 Chronic obstructive pulmonary disease, unspecified: Secondary | ICD-10-CM | POA: Diagnosis not present

## 2015-04-25 DIAGNOSIS — J439 Emphysema, unspecified: Secondary | ICD-10-CM | POA: Diagnosis not present

## 2015-05-01 ENCOUNTER — Other Ambulatory Visit (HOSPITAL_BASED_OUTPATIENT_CLINIC_OR_DEPARTMENT_OTHER): Payer: PPO

## 2015-05-01 ENCOUNTER — Telehealth: Payer: Self-pay | Admitting: Internal Medicine

## 2015-05-01 ENCOUNTER — Encounter: Payer: Self-pay | Admitting: Internal Medicine

## 2015-05-01 ENCOUNTER — Ambulatory Visit (HOSPITAL_BASED_OUTPATIENT_CLINIC_OR_DEPARTMENT_OTHER): Payer: PPO | Admitting: Internal Medicine

## 2015-05-01 VITALS — BP 144/74 | HR 94 | Temp 98.6°F | Resp 18 | Ht 64.0 in | Wt 98.4 lb

## 2015-05-01 DIAGNOSIS — C3490 Malignant neoplasm of unspecified part of unspecified bronchus or lung: Secondary | ICD-10-CM

## 2015-05-01 DIAGNOSIS — C3411 Malignant neoplasm of upper lobe, right bronchus or lung: Secondary | ICD-10-CM

## 2015-05-01 DIAGNOSIS — Z5111 Encounter for antineoplastic chemotherapy: Secondary | ICD-10-CM

## 2015-05-01 DIAGNOSIS — D649 Anemia, unspecified: Secondary | ICD-10-CM | POA: Diagnosis not present

## 2015-05-01 DIAGNOSIS — M549 Dorsalgia, unspecified: Secondary | ICD-10-CM

## 2015-05-01 DIAGNOSIS — L03119 Cellulitis of unspecified part of limb: Secondary | ICD-10-CM | POA: Diagnosis not present

## 2015-05-01 LAB — COMPREHENSIVE METABOLIC PANEL
ALT: 11 U/L (ref 0–55)
ANION GAP: 8 meq/L (ref 3–11)
AST: 19 U/L (ref 5–34)
Albumin: 2.3 g/dL — ABNORMAL LOW (ref 3.5–5.0)
Alkaline Phosphatase: 194 U/L — ABNORMAL HIGH (ref 40–150)
BUN: 19.4 mg/dL (ref 7.0–26.0)
CHLORIDE: 105 meq/L (ref 98–109)
CO2: 26 meq/L (ref 22–29)
Calcium: 8.3 mg/dL — ABNORMAL LOW (ref 8.4–10.4)
Creatinine: 0.8 mg/dL (ref 0.6–1.1)
EGFR: 72 mL/min/{1.73_m2} — AB (ref >90)
GLUCOSE: 108 mg/dL (ref 70–140)
POTASSIUM: 4 meq/L (ref 3.5–5.1)
SODIUM: 139 meq/L (ref 136–145)
Total Bilirubin: 0.53 mg/dL (ref 0.20–1.20)
Total Protein: 5.7 g/dL — ABNORMAL LOW (ref 6.4–8.3)

## 2015-05-01 LAB — CBC WITH DIFFERENTIAL/PLATELET
BASO%: 1.1 % (ref 0.0–2.0)
BASOS ABS: 0 10*3/uL (ref 0.0–0.1)
EOS ABS: 0.2 10*3/uL (ref 0.0–0.5)
EOS%: 3.9 % (ref 0.0–7.0)
HCT: 32.4 % — ABNORMAL LOW (ref 34.8–46.6)
HGB: 10.4 g/dL — ABNORMAL LOW (ref 11.6–15.9)
LYMPH%: 18.3 % (ref 14.0–49.7)
MCH: 26.1 pg (ref 25.1–34.0)
MCHC: 32 g/dL (ref 31.5–36.0)
MCV: 81.7 fL (ref 79.5–101.0)
MONO#: 0.4 10*3/uL (ref 0.1–0.9)
MONO%: 9.8 % (ref 0.0–14.0)
NEUT%: 66.9 % (ref 38.4–76.8)
NEUTROS ABS: 2.8 10*3/uL (ref 1.5–6.5)
Platelets: 319 10*3/uL (ref 145–400)
RBC: 3.96 10*6/uL (ref 3.70–5.45)
RDW: 19.7 % — ABNORMAL HIGH (ref 11.2–14.5)
WBC: 4.2 10*3/uL (ref 3.9–10.3)
lymph#: 0.8 10*3/uL — ABNORMAL LOW (ref 0.9–3.3)

## 2015-05-01 MED ORDER — SULFAMETHOXAZOLE-TRIMETHOPRIM 800-160 MG PO TABS: 1.0000 | ORAL_TABLET | Freq: Two times a day (BID) | ORAL | Status: DC

## 2015-05-01 NOTE — Progress Notes (Signed)
[facility] [phone]   [phone]  OFFICE PROGRESS NOTE  Glo Herring., MD [place] [address]  DIAGNOSIS: Recurrent non-small cell lung cancer, adenocarcinoma with positive EGFR mutation in exon 21 ([id number]). The patient has multifocal disease including 2 nodules in the right lung with a suspicious tiny lesion in the left lung.  PRIOR THERAPY: Palliative radiotherapy to the enlarging right upper lobe lung mass under the care of Dr. Pablo Ledger completed on 01/06/2015.  CURRENT THERAPY:  1) Tarceva 150 mg by mouth daily status post 10 months of treatment.  INTERVAL HISTORY: [name] 75 y.o. female returns to the clinic today for follow-up visit accompanied by her daughter. The patient is feeling fine today with no specific complaints except for shortness of breath, and fatigue. She also has mild upper mid back pain. She was seen by Dr. Pablo Ledger for consideration palliative radiotherapy to the T3 lesion but this was placed on hold secondary to concerns about toxicity. She is feeling much better today after receiving 2 units of PRBCs transfusion few weeks ago. The patient denied having any significant chest pain, cough or hemoptysis. She is currently on home oxygen for the history of COPD and shortness of breath. She has no significant weight loss or night sweats. She is tolerating her treatment with oral Tarceva fairly well with no significant adverse effects except for mild diarrhea improved with Imodium. She continues to have swelling of the lower extremities with mild cellulitis. She was treated with doxycycline with minimal improvement. She is requesting refill of Bactrim DS as it did help in the past. She had repeat CBC and comprehensive metabolic panel performed earlier today and she is here for evaluation and discussion of her lab results.  MEDICAL HISTORY: Past Medical History  Diagnosis Date  . COPD (chronic obstructive  pulmonary disease) ([place])   . Thyroid disease   . Pernicious anemia   . Hypertension   . Lung cancer ([place])   . AAA (abdominal aortic aneurysm) ([place])   . Chronic kidney disease   . Stroke ([place])   . Primary cancer of right upper lobe of lung ([place]) 05/10/2012  . Anemia of chronic disease 11/14/2014    ALLERGIES:  is allergic to codeine; penicillins; and pravastatin sodium.  MEDICATIONS:  Current Outpatient Prescriptions  Medication Sig Dispense Refill  . acetaminophen (TYLENOL) 500 MG tablet Take 500-1,000 mg by mouth every 6 (six) hours as needed for moderate pain.     Marland Kitchen albuterol (PROVENTIL) (2.5 MG/3ML) 0.083% nebulizer solution Take 2.5 mg by nebulization every 4 (four) hours. Or Four times daily    . ALPRAZolam (XANAX) 1 MG tablet Take 1 mg by mouth 4 (four) times daily as needed. For anxiety and/or sleep    . aspirin EC 81 MG EC tablet Take 1 tablet (81 mg total) by mouth daily.    . clindamycin (CLEOCIN T) 1 % external solution Apply topically 2 (two) times daily. (Patient taking differently: Apply 1 application topically as needed. ) 30 mL 1  . Cyanocobalamin (VITAMIN B-12) 1000 MCG SUBL Place 1 tablet under the tongue daily.    . ferrous sulfate 325 (65 FE) MG tablet Take 325 mg by mouth 3 (three) times daily with meals.    . Fluticasone-Salmeterol (ADVAIR) 250-50 MCG/DOSE AEPB Inhale 1 puff into the lungs every 12 (twelve) hours.    Marland Kitchen guaiFENesin (MUCINEX) 600 MG 12 hr tablet Take 600 mg by mouth daily as needed  for cough.     . levothyroxine (SYNTHROID, LEVOTHROID) 100 MCG tablet Take 100 mcg by mouth daily before breakfast.     . loperamide (IMODIUM) 2 MG capsule Take 2 mg by mouth as needed for diarrhea or loose stools.    Marland Kitchen loratadine (CLARITIN) 10 MG tablet Take 10 mg by mouth daily as needed for allergies.     . mirtazapine (REMERON) 7.5 MG tablet Take 7.5 mg by mouth at bedtime.  12  . NON FORMULARY OXYGEN      24/7    . potassium chloride SA (K-DUR,KLOR-CON) 20 MEQ tablet  Take 1 tablet (20 mEq total) by mouth daily. 7 tablet 0  . TARCEVA 150 MG tablet TAKE 1 TABLET BY MOUTH DAILY. TAKE ON AN EMPTY STOMACH 1 HOUR BEFORE MEALS OR 2 HOURS AFTER. 30 tablet 2  . traMADol (ULTRAM) 50 MG tablet Take 1 tablet (50 mg total) by mouth every 12 (twelve) hours as needed. 50 tablet 1  . Vitamin D, Ergocalciferol, (DRISDOL) 50000 UNITS CAPS capsule Take 50,000 Units by mouth every 30 (thirty) days. Fridays     No current facility-administered medications for this visit.    SURGICAL HISTORY:  Past Surgical History  Procedure Laterality Date  . Abdominal hysterectomy    . Appendectomy    . Cholecystectomy    . Tubal ligation    . Chest tube insertion  03/08/2012    Procedure: CHEST TUBE INSERTION;  Surgeon: Ivin Poot, MD;  Location: [facility];  Service: Thoracic;  Laterality: Right;  . Video assisted thoracoscopy  03/17/2012    Procedure: VIDEO ASSISTED THORACOSCOPY;  Surgeon: Ivin Poot, MD;  Location: [facility];  Service: Thoracic;  Laterality: Right;  . Resection of apical bleb  03/17/2012    Procedure: RESECTION OF APICAL BLEB;  Surgeon: Ivin Poot, MD;  Location: [facility];  Service: Thoracic;  Laterality: Right;  stapling of bleb  . Colonoscopy N/A 04/29/2014    Procedure: COLONOSCOPY;  Surgeon: Daneil Dolin, MD;  [facility];  Service: Endoscopy;  Laterality: N/A;  1245pm  . Esophagogastroduodenoscopy N/A 04/29/2014    Procedure: ESOPHAGOGASTRODUODENOSCOPY (EGD);  Surgeon: Daneil Dolin, MD;  [facility];  Service: Endoscopy;  Laterality: N/A;  . Sav[place] dilation N/A 04/29/2014    Procedure: SAVORY DILATION;  Surgeon: Daneil Dolin, MD;  [facility];  Service: Endoscopy;  Laterality: N/A;  Venia Minks dilation N/A 04/29/2014    Procedure: Venia Minks DILATION;  Surgeon: Daneil Dolin, MD;  [facility];  Service: Endoscopy;  Laterality: N/A;  . Tee without cardioversion N/A 11/15/2014    Procedure: TRANSESOPHAGEAL  ECHOCARDIOGRAM (TEE);  Surgeon: Sueanne Margarita, MD;  Location: [facility];  Service: Cardiovascular;  Laterality: N/A;    REVIEW OF SYSTEMS:  A comprehensive review of systems was negative except for: Constitutional: positive for fatigue Respiratory: positive for dyspnea on exertion Musculoskeletal: positive for muscle weakness Swelling of the lower extremities   PHYSICAL EXAMINATION: General appearance: alert, cooperative, fatigued and no distress Head: Normocephalic, without obvious abnormality, atraumatic Neck: no adenopathy, no JVD, supple, symmetrical, trachea midline and thyroid not enlarged, symmetric, no tenderness/mass/nodules Lymph nodes: Cervical, supraclavicular, and axillary nodes normal. Resp: clear to auscultation bilaterally Back: symmetric, no curvature. ROM normal. No CVA tenderness. Cardio: regular rate and rhythm, S1, S2 normal, no murmur, click, rub or gallop GI: soft, non-tender; bowel sounds normal; no masses,  no organomegaly Extremities: edema 1+ Neurologic: Alert and oriented  X 3, normal strength and tone. Normal symmetric reflexes. Normal coordination and gait  ECOG PERFORMANCE STATUS: 2 - Symptomatic, <50% confined to bed  Blood pressure 144/74, pulse 94, temperature 98.6 F (37 C), temperature source Oral, resp. rate 18, height 5' 4"  (1.626 m), weight 98 lb 6.4 oz (44.634 kg), SpO2 99 %.  LABORATORY DATA: Lab Results  Component Value Date   WBC 4.2 05/01/2015   HGB 10.4* 05/01/2015   HCT 32.4* 05/01/2015   MCV 81.7 05/01/2015   PLT 319 05/01/2015      Chemistry      Component Value Date/Time   NA 139 05/01/2015 1048   NA 140 11/13/2014 1744   K 4.0 05/01/2015 1048   K 3.1* 11/13/2014 1744   CL 103 11/13/2014 1744   CO2 26 05/01/2015 1048   CO2 26 11/13/2014 1736   BUN 19.4 05/01/2015 1048   BUN 16 11/13/2014 1744   CREATININE 0.8 05/01/2015 1048   CREATININE 1.00 11/13/2014 1744   CREATININE 1.13* 03/14/2013 1210      Component Value  Date/Time   CALCIUM 8.3* 05/01/2015 1048   CALCIUM 8.7* 11/13/2014 1736   ALKPHOS 194* 05/01/2015 1048   ALKPHOS 118 11/13/2014 1736   AST 19 05/01/2015 1048   AST 17 11/13/2014 1736   ALT 11 05/01/2015 1048   ALT 11* 11/13/2014 1736   BILITOT 0.53 05/01/2015 1048   BILITOT 0.7 11/13/2014 1736       RADIOGRAPHIC STUDIES: No results found.  ASSESSMENT AND PLAN: This is a very pleasant 75 years old with metastatic non-small cell lung cancer, adenocarcinoma with positive EGFR mutation who is currently undergoing treatment with Tarceva 150 mg by mouth daily status post 10 months of treatment. The patient is tolerating her treatment fairly well with no significant adverse effects except for diarrhea and mid back pain. I recommended for the patient to continue her treatment with Tarceva with the same dose for now. For the back pain, she will continue on pain medication for now. For the anemia, better after receiving 2 units of PRBCs transfusion. The patient would come back for follow-up visit in one month for reevaluation with repeat blood work as well as CT scan of the chest, abdomen and pelvis for restaging of her disease. For the cellulitis of the lower extremity, she was given a refill of Bactrim DS. She was advised to call immediately if she has any concerning symptoms in the interval. The patient voices understanding of current disease status and treatment options and is in agreement with the current care plan.  All questions were answered. The patient knows to call the clinic with any problems, questions or concerns. We can certainly see the patient much sooner if necessary.  Disclaimer: This note was dictated with voice recognition software. Similar sounding words can inadvertently be transcribed and may not be corrected upon review.

## 2015-05-01 NOTE — Telephone Encounter (Signed)
Pt confirmed labs/ov per 01/26 POF, gave pt AVS and Calendar... KJ

## 2015-05-13 DIAGNOSIS — Z0000 Encounter for general adult medical examination without abnormal findings: Secondary | ICD-10-CM | POA: Diagnosis not present

## 2015-05-13 DIAGNOSIS — Z681 Body mass index (BMI) 19 or less, adult: Secondary | ICD-10-CM | POA: Diagnosis not present

## 2015-05-13 DIAGNOSIS — Z1389 Encounter for screening for other disorder: Secondary | ICD-10-CM | POA: Diagnosis not present

## 2015-05-15 ENCOUNTER — Telehealth: Payer: Self-pay | Admitting: Pharmacist

## 2015-05-15 NOTE — Telephone Encounter (Signed)
05/15/15: Patient approved for $7,000 lung cancer grant for Tarceva with the [employer]. Information faxed to [facility] to be documented in patient's file.   I called patient and let her know about the grant. She appreciated the call and the help with securing assistance.   Thank you,  Montel Clock, PharmD, [facility]

## 2015-05-19 ENCOUNTER — Other Ambulatory Visit: Payer: Self-pay | Admitting: Internal Medicine

## 2015-05-20 MED FILL — TARCEVA 150 MG TABLET: 150 | 30 days supply | Qty: 30 | Fill #0

## 2015-05-26 DIAGNOSIS — J449 Chronic obstructive pulmonary disease, unspecified: Secondary | ICD-10-CM | POA: Diagnosis not present

## 2015-05-26 DIAGNOSIS — J439 Emphysema, unspecified: Secondary | ICD-10-CM | POA: Diagnosis not present

## 2015-05-30 ENCOUNTER — Encounter (HOSPITAL_COMMUNITY): Payer: Self-pay

## 2015-05-30 ENCOUNTER — Ambulatory Visit (HOSPITAL_COMMUNITY)
Admission: RE | Admit: 2015-05-30 | Discharge: 2015-05-30 | Disposition: A | Discharge status: Home / Self Care | Payer: PPO | Source: Ambulatory Visit | Attending: Internal Medicine | Admitting: Internal Medicine

## 2015-05-30 DIAGNOSIS — J439 Emphysema, unspecified: Secondary | ICD-10-CM | POA: Insufficient documentation

## 2015-05-30 DIAGNOSIS — R911 Solitary pulmonary nodule: Secondary | ICD-10-CM | POA: Insufficient documentation

## 2015-05-30 DIAGNOSIS — Z5111 Encounter for antineoplastic chemotherapy: Secondary | ICD-10-CM

## 2015-05-30 DIAGNOSIS — C3411 Malignant neoplasm of upper lobe, right bronchus or lung: Secondary | ICD-10-CM | POA: Insufficient documentation

## 2015-05-30 DIAGNOSIS — I2510 Atherosclerotic heart disease of native coronary artery without angina pectoris: Secondary | ICD-10-CM | POA: Insufficient documentation

## 2015-05-30 DIAGNOSIS — Z9221 Personal history of antineoplastic chemotherapy: Secondary | ICD-10-CM | POA: Diagnosis not present

## 2015-05-30 MED ORDER — IOHEXOL 300 MG/ML  SOLN
75.0000 mL | Freq: Once | INTRAMUSCULAR | Status: AC | PRN
  Administered 2015-05-30: 75 mL via INTRAVENOUS

## 2015-06-04 ENCOUNTER — Encounter: Payer: Self-pay | Admitting: Internal Medicine

## 2015-06-04 ENCOUNTER — Ambulatory Visit (HOSPITAL_BASED_OUTPATIENT_CLINIC_OR_DEPARTMENT_OTHER): Payer: PPO | Admitting: Internal Medicine

## 2015-06-04 ENCOUNTER — Other Ambulatory Visit (HOSPITAL_BASED_OUTPATIENT_CLINIC_OR_DEPARTMENT_OTHER): Payer: PPO

## 2015-06-04 ENCOUNTER — Telehealth: Payer: Self-pay | Admitting: Internal Medicine

## 2015-06-04 VITALS — BP 164/72 | HR 87 | Temp 98.4°F | Resp 18 | Ht 64.0 in | Wt 99.3 lb

## 2015-06-04 DIAGNOSIS — C3411 Malignant neoplasm of upper lobe, right bronchus or lung: Secondary | ICD-10-CM

## 2015-06-04 DIAGNOSIS — Z5111 Encounter for antineoplastic chemotherapy: Secondary | ICD-10-CM

## 2015-06-04 DIAGNOSIS — D649 Anemia, unspecified: Secondary | ICD-10-CM | POA: Diagnosis not present

## 2015-06-04 LAB — CBC WITH DIFFERENTIAL/PLATELET
BASO%: 0.5 % (ref 0.0–2.0)
Basophils Absolute: 0 10*3/uL (ref 0.0–0.1)
EOS%: 3 % (ref 0.0–7.0)
Eosinophils Absolute: 0.1 10*3/uL (ref 0.0–0.5)
HCT: 28.6 % — ABNORMAL LOW (ref 34.8–46.6)
HGB: 9 g/dL — ABNORMAL LOW (ref 11.6–15.9)
LYMPH%: 20.6 % (ref 14.0–49.7)
MCH: 27.6 pg (ref 25.1–34.0)
MCHC: 31.5 g/dL (ref 31.5–36.0)
MCV: 87.7 fL (ref 79.5–101.0)
MONO#: 0.3 10*3/uL (ref 0.1–0.9)
MONO%: 7.4 % (ref 0.0–14.0)
NEUT#: 2.7 10*3/uL (ref 1.5–6.5)
NEUT%: 68.5 % (ref 38.4–76.8)
Platelets: 342 10*3/uL (ref 145–400)
RBC: 3.26 10*6/uL — AB (ref 3.70–5.45)
RDW: 17.8 % — AB (ref 11.2–14.5)
WBC: 3.9 10*3/uL (ref 3.9–10.3)
lymph#: 0.8 10*3/uL — ABNORMAL LOW (ref 0.9–3.3)
nRBC: 0 % (ref 0–0)

## 2015-06-04 LAB — COMPREHENSIVE METABOLIC PANEL WITH GFR
ALT: <9 U/L (ref 0–55)
AST: 13 U/L (ref 5–34)
Albumin: 2.5 g/dL — ABNORMAL LOW (ref 3.5–5.0)
Alkaline Phosphatase: 161 U/L — ABNORMAL HIGH (ref 40–150)
Anion Gap: 8 meq/L (ref 3–11)
BUN: 12.5 mg/dL (ref 7.0–26.0)
CO2: 29 meq/L (ref 22–29)
Calcium: 8 mg/dL — ABNORMAL LOW (ref 8.4–10.4)
Chloride: 104 meq/L (ref 98–109)
Creatinine: 0.8 mg/dL (ref 0.6–1.1)
EGFR: 69 ml/min/1.73 m2 — ABNORMAL LOW (ref >90)
Glucose: 97 mg/dL (ref 70–140)
Potassium: 3.6 meq/L (ref 3.5–5.1)
Sodium: 142 meq/L (ref 136–145)
Total Bilirubin: 0.41 mg/dL (ref 0.20–1.20)
Total Protein: 5.6 g/dL — ABNORMAL LOW (ref 6.4–8.3)

## 2015-06-04 NOTE — Telephone Encounter (Signed)
Pt confirmed appt and requested a calendar only for future appt.

## 2015-06-04 NOTE — Progress Notes (Signed)
[place] [phone]) [phone]   Fax:(336) [phone]  OFFICE PROGRESS NOTE  Glo Herring., MD [place] [address]  DIAGNOSIS: Recurrent non-small cell lung cancer, adenocarcinoma with positive EGFR mutation in exon 21 (L858R). The patient has multifocal disease including 2 nodules in the right lung with a suspicious tiny lesion in the left lung.  PRIOR THERAPY: Palliative radiotherapy to the enlarging right upper lobe lung mass under the care of Dr. Pablo Ledger completed on 01/06/2015.  CURRENT THERAPY:  1) Tarceva 150 mg by mouth daily status post 11 months of treatment.  INTERVAL HISTORY: [name] 75 y.o. female returns to the clinic today for follow-up visit accompanied by her daughter. The patient is feeling fine today with no specific complaints except for shortness of breath, and fatigue as well as swelling of the lower extremities. She took only half tablet of Lasix recently because her nephrologist advised her not to take any diuretics. She was also tried on Bactrim DS with little improvement. The patient denied having any significant chest pain, cough or hemoptysis. She is currently on home oxygen for the history of COPD and shortness of breath. She is tolerating her treatment with oral Tarceva fairly well with no significant adverse effects except for mild diarrhea improved with Imodium. She had repeat CBC and comprehensive metabolic panel as well as CT scan of the chest performed recently and she is here for evaluation and discussion of her lab results.  MEDICAL HISTORY: Past Medical History  Diagnosis Date  . COPD (chronic obstructive pulmonary disease) ([place])   . Thyroid disease   . Pernicious anemia   . Hypertension   . AAA (abdominal aortic aneurysm) ([place])   . Chronic kidney disease   . Stroke ([place])   . Anemia of chronic disease 11/14/2014  . Lung cancer ([place])   . Primary cancer of right upper lobe of lung ([place]) 05/10/2012     ALLERGIES:  is allergic to codeine; penicillins; and pravastatin sodium.  MEDICATIONS:  Current Outpatient Prescriptions  Medication Sig Dispense Refill  . acetaminophen (TYLENOL) 500 MG tablet Take 500-1,000 mg by mouth every 6 (six) hours as needed for moderate pain.     Marland Kitchen albuterol (PROVENTIL) (2.5 MG/3ML) 0.083% nebulizer solution Take 2.5 mg by nebulization every 4 (four) hours. Or Four times daily    . ALPRAZolam (XANAX) 1 MG tablet Take 1 mg by mouth 4 (four) times daily as needed. For anxiety and/or sleep    . aspirin EC 81 MG EC tablet Take 1 tablet (81 mg total) by mouth daily.    . clindamycin (CLEOCIN T) 1 % external solution Apply topically 2 (two) times daily. (Patient taking differently: Apply 1 application topically as needed. ) 30 mL 1  . Cyanocobalamin (VITAMIN B-12) 1000 MCG SUBL Place 1 tablet under the tongue daily.    . ferrous sulfate 325 (65 FE) MG tablet Take 325 mg by mouth 3 (three) times daily with meals.    . Fluticasone-Salmeterol (ADVAIR) 250-50 MCG/DOSE AEPB Inhale 1 puff into the lungs every 12 (twelve) hours.    Marland Kitchen guaiFENesin (MUCINEX) 600 MG 12 hr tablet Take 600 mg by mouth daily as needed for cough.     . levothyroxine (SYNTHROID, LEVOTHROID) 100 MCG tablet Take 100 mcg by mouth daily before breakfast.     . loperamide (IMODIUM) 2 MG capsule Take 2 mg by mouth as needed for diarrhea or loose stools.    Marland Kitchen loratadine (CLARITIN)  10 MG tablet Take 10 mg by mouth daily as needed for allergies.     . mirtazapine (REMERON) 7.5 MG tablet Take 7.5 mg by mouth at bedtime.  12  . NON FORMULARY OXYGEN      24/7    . potassium chloride SA (K-DUR,KLOR-CON) 20 MEQ tablet Take 1 tablet (20 mEq total) by mouth daily. 7 tablet 0  . sulfamethoxazole-trimethoprim (BACTRIM DS,SEPTRA DS) 800-160 MG tablet Take 1 tablet by mouth 2 (two) times daily. 14 tablet 0  . TARCEVA 150 MG tablet TAKE 1 TABLET BY MOUTH DAILY. TAKE ON AN EMPTY STOMACH 1 HOUR BEFORE MEALS OR 2 HOURS  AFTER. 30 tablet 2  . traMADol (ULTRAM) 50 MG tablet Take 1 tablet (50 mg total) by mouth every 12 (twelve) hours as needed. 50 tablet 1  . Vitamin D, Ergocalciferol, (DRISDOL) 50000 UNITS CAPS capsule Take 50,000 Units by mouth every 30 (thirty) days. Fridays     No current facility-administered medications for this visit.    SURGICAL HISTORY:  Past Surgical History  Procedure Laterality Date  . Abdominal hysterectomy    . Appendectomy    . Cholecystectomy    . Tubal ligation    . Chest tube insertion  03/08/2012    Procedure: CHEST TUBE INSERTION;  Surgeon: Ivin Poot, MD;  Location: [place];  Service: Thoracic;  Laterality: Right;  . Video assisted thoracoscopy  03/17/2012    Procedure: VIDEO ASSISTED THORACOSCOPY;  Surgeon: Ivin Poot, MD;  Location: [facility];  Service: Thoracic;  Laterality: Right;  . Resection of apical bleb  03/17/2012    Procedure: RESECTION OF APICAL BLEB;  Surgeon: Ivin Poot, MD;  Location: [place] [facility];  Service: Thoracic;  Laterality: Right;  stapling of bleb  . Colonoscopy N/A 04/29/2014    Procedure: COLONOSCOPY;  Surgeon: Daneil Dolin, MD;  [facility];  Service: Endoscopy;  Laterality: N/A;  1245pm  . Esophagogastroduodenoscopy N/A 04/29/2014    Procedure: ESOPHAGOGASTRODUODENOSCOPY (EGD);  Surgeon: Daneil Dolin, MD;  [facility];  Service: Endoscopy;  Laterality: N/A;  . Savory dilation N/A 04/29/2014    Procedure: SAVORY DILATION;  Surgeon: Daneil Dolin, MD;  [facility];  Service: Endoscopy;  Laterality: N/A;  Venia Minks dilation N/A 04/29/2014    Procedure: Venia Minks DILATION;  Surgeon: Daneil Dolin, MD;  [facility];  Service: Endoscopy;  Laterality: N/A;  . Tee without cardioversion N/A 11/15/2014    Procedure: TRANSESOPHAGEAL ECHOCARDIOGRAM (TEE);  Surgeon: Sueanne Margarita, MD;  Location[facility];  Service: Cardiovascular;  Laterality: N/A;    REVIEW OF SYSTEMS:  Constitutional: positive for  fatigue and weight loss Eyes: negative Ears, nose, mouth, throat, and face: negative Respiratory: positive for dyspnea on exertion Cardiovascular: negative Gastrointestinal: positive for diarrhea Genitourinary:negative Integument/breast: negative Hematologic/lymphatic: negative Musculoskeletal:positive for muscle weakness Neurological: negative Behavioral/Psych: negative Endocrine: negative Allergic/Immunologic: negative   PHYSICAL EXAMINATION: General appearance: alert, cooperative, fatigued and no distress Head: Normocephalic, without obvious abnormality, atraumatic Neck: no adenopathy, no JVD, supple, symmetrical, trachea midline and thyroid not enlarged, symmetric, no tenderness/mass/nodules Lymph nodes: Cervical, supraclavicular, and axillary nodes normal. Resp: clear to auscultation bilaterally Back: symmetric, no curvature. ROM normal. No CVA tenderness. Cardio: regular rate and rhythm, S1, S2 normal, no murmur, click, rub or gallop GI: soft, non-tender; bowel sounds normal; no masses,  no organomegaly Extremities: edema 1+ Neurologic: Alert and oriented X 3, normal strength and tone. Normal symmetric reflexes. Normal coordination and  gait  ECOG PERFORMANCE STATUS: 2 - Symptomatic, <50% confined to bed  Blood pressure 164/72, pulse 87, temperature 98.4 F (36.9 C), temperature source Oral, resp. rate 18, height 5' 4" (1.626 m), weight 99 lb 4.8 oz (45.042 kg), SpO2 99 %.  LABORATORY DATA: Lab Results  Component Value Date   WBC 3.9 06/04/2015   HGB 9.0* 06/04/2015   HCT 28.6* 06/04/2015   MCV 87.7 06/04/2015   PLT 342 06/04/2015      Chemistry      Component Value Date/Time   NA 139 05/01/2015 1048   NA 140 11/13/2014 1744   K 4.0 05/01/2015 1048   K 3.1* 11/13/2014 1744   CL 103 11/13/2014 1744   CO2 26 05/01/2015 1048   CO2 26 11/13/2014 1736   BUN 19.4 05/01/2015 1048   BUN 16 11/13/2014 1744   CREATININE 0.8 05/01/2015 1048   CREATININE 1.00 11/13/2014  1744   CREATININE 1.13* 03/14/2013 1210      Component Value Date/Time   CALCIUM 8.3* 05/01/2015 1048   CALCIUM 8.7* 11/13/2014 1736   ALKPHOS 194* 05/01/2015 1048   ALKPHOS 118 11/13/2014 1736   AST 19 05/01/2015 1048   AST 17 11/13/2014 1736   ALT 11 05/01/2015 1048   ALT 11* 11/13/2014 1736   BILITOT 0.53 05/01/2015 1048   BILITOT 0.7 11/13/2014 1736       RADIOGRAPHIC STUDIES: Ct Chest W Contrast  05/30/2015  CLINICAL DATA:  75 year old female with history of right-sided lung cancer originally diagnosed in 2013 status post lung resection with chemotherapy ongoing. Palliative radiotherapy to right upper lobe mass completed 01/06/2015. Restaging examination. EXAM: CT CHEST WITH CONTRAST TECHNIQUE: Multidetector CT imaging of the chest was performed during intravenous contrast administration. CONTRAST:  83m OMNIPAQUE IOHEXOL 300 MG/ML  SOLN COMPARISON:  Multiple priors, most recently 03/03/2015. FINDINGS: Mediastinum/Lymph Nodes: Heart size is normal. There is no significant pericardial fluid, thickening or pericardial calcification. There is atherosclerosis of the thoracic aorta, the great vessels of the mediastinum and the coronary arteries, including calcified atherosclerotic plaque in the left main, left anterior descending, left circumflex and right coronary arteries. Pulmonic trunk is dilated measuring 3.8 cm in diameter. Enlarged low right paratracheal lymph node measuring 1.4 cm in short axis. No other definite mediastinal or hilar lymphadenopathy. Esophagus is unremarkable in appearance. No axillary lymphadenopathy. Lungs/Pleura: Postoperative changes of wedge resection are again noted in the right upper lobe. There continues to be a chronic thick-walled cavitary area in the right upper lobe which currently measures approximate 2.9 x 2.3 cm (image 19 of series 5), slightly decreased in size compared to the prior examination. There is extensive architectural distortion throughout the  right upper lobe and superior segment of the right lower lobe, which has significantly increased compared to the prior study and is now associated with confluent areas of apparent consolidation, with some associated air bronchograms, most notably in the right upper lobe. 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 There continues to be a background of diffuse bronchial wall thickening with moderate centrilobular and paraseptal emphysema. New 7 mm nodule in the anterior aspect of the left upper lobe (image 28 of series 5) is nonspecific. No significant pleural effusions. Upper Abdomen: Diffuse low attenuation throughout the hepatic parenchyma, compatible with hepatic steatosis. Status post cholecystectomy. Atherosclerosis, and including an incompletely visualized fusiform infrarenal abdominal aortic aneurysm which measures at least 3.1 x 3.0 cm in diameter. Calcifications throughout  the pancreas, presumably from chronic pancreatitis again noted. Musculoskeletal/Soft Tissues: Lucent lesion with sclerotic margins in the right side of the T3 vertebral body is compatible with a treated metastatic lesion. The paravertebral soft tissue noted on prior examinations adjacent to this lesion appears less mass-like in appearance, favored to represent a treated lesion and area of developing fibrosis on today's study. No new osseous lesions are otherwise noted. IMPRESSION: 1. Today's study demonstrates evolving post treatment related changes of prior radiation therapy and chemotherapy, as discussed above. Today's study will serve as a baseline for future followup examinations. The most concerning feature of today's examination is an enlarged low right paratracheal lymph node which measures 1.4 cm in short axis. This has progressively enlarged compared to prior examinations, and the possibility of nodal metastatic disease is  of concern and warrants close attention on follow-up studies. 2. New left upper lobe 7 mm pulmonary nodule is highly nonspecific. Attention on followup studies is recommended. 3. Diffuse bronchial thickening with moderate centrilobular and paraseptal emphysema; imaging findings suggestive of underlying COPD. 4. Severe dilatation of the pulmonic trunk (3.8 cm in diameter), suggestive of pulmonary arterial hypertension. 5. Atherosclerosis, including left main and 3 vessel coronary artery disease. Assessment for potential risk factor modification, dietary therapy or pharmacologic therapy may be warranted, if clinically indicated. Electronically Signed   By: Vinnie Langton M.D.   On: 05/30/2015 10:51    ASSESSMENT AND PLAN: This is a very pleasant 75 years old with metastatic non-small cell lung cancer, adenocarcinoma with positive EGFR mutation who is currently undergoing treatment with Tarceva 150 mg by mouth daily status post 11 months of treatment. The patient is tolerating her treatment fairly well with no significant adverse effects except for diarrhea improved with Imodium. The recent CT scan of the chest showed stable disease except for the slowly enlarging right paratracheal lymph node. I personally reviewed the images and discussed the scan results with the patient and her daughter. I recommended for the patient to continue her treatment with Tarceva with the same dose for now. For the back pain, she will continue on pain medication for now. For the anemia, she received 2 units of PRBCs transfusion last month. I will continue to monitor her hemoglobin and hematocrit closely. The patient would come back for follow-up visit in one month for reevaluation with repeat blood work. For the cellulitis of the lower extremity, she was advised to elevate her lower extremities and also to discuss with her primary care physician for any other recommendation. She was advised to call immediately if she has any  concerning symptoms in the interval. The patient voices understanding of current disease status and treatment options and is in agreement with the current care plan.  All questions were answered. The patient knows to call the clinic with any problems, questions or concerns. We can certainly see the patient much sooner if necessary.  Disclaimer: This note was dictated with voice recognition software. Similar sounding words can inadvertently be transcribed and may not be corrected upon review.

## 2015-06-19 DIAGNOSIS — R809 Proteinuria, unspecified: Secondary | ICD-10-CM | POA: Diagnosis not present

## 2015-06-19 DIAGNOSIS — I10 Essential (primary) hypertension: Secondary | ICD-10-CM | POA: Diagnosis not present

## 2015-06-19 DIAGNOSIS — J449 Chronic obstructive pulmonary disease, unspecified: Secondary | ICD-10-CM | POA: Diagnosis not present

## 2015-06-19 DIAGNOSIS — C3490 Malignant neoplasm of unspecified part of unspecified bronchus or lung: Secondary | ICD-10-CM | POA: Diagnosis not present

## 2015-06-19 DIAGNOSIS — E876 Hypokalemia: Secondary | ICD-10-CM | POA: Diagnosis not present

## 2015-06-19 DIAGNOSIS — J9610 Chronic respiratory failure, unspecified whether with hypoxia or hypercapnia: Secondary | ICD-10-CM | POA: Diagnosis not present

## 2015-06-19 DIAGNOSIS — N183 Chronic kidney disease, stage 3 (moderate): Secondary | ICD-10-CM | POA: Diagnosis not present

## 2015-06-19 DIAGNOSIS — D631 Anemia in chronic kidney disease: Secondary | ICD-10-CM | POA: Diagnosis not present

## 2015-06-23 ENCOUNTER — Telehealth: Payer: Self-pay | Admitting: Pharmacist

## 2015-06-23 DIAGNOSIS — J439 Emphysema, unspecified: Secondary | ICD-10-CM | POA: Diagnosis not present

## 2015-06-23 DIAGNOSIS — J449 Chronic obstructive pulmonary disease, unspecified: Secondary | ICD-10-CM | POA: Diagnosis not present

## 2015-06-23 MED FILL — TARCEVA 150 MG TABLET: 150 | 30 days supply | Qty: 30 | Fill #1

## 2015-06-23 NOTE — Telephone Encounter (Signed)
[facility] called [facility] re: refill for Tarceva on Ms [address]. Pt thought that her RX coverage ended 05/21/15, however she was approved for Tarceva assistance through [employer] and her term is 05/15/15 - 04/04/16 Available balance is $7000. I provided Brayton Layman, Pharmacist at [employer] w/ pts Member ID# and BIN # for processing her RX. Brayton Layman will be in touch w/ Ms. Grassel to come pick up her Tarceva. Kennith Center, Pharm.D., CPP 06/23/2015'@12'$ :47 PM

## 2015-06-26 DIAGNOSIS — J449 Chronic obstructive pulmonary disease, unspecified: Secondary | ICD-10-CM | POA: Diagnosis not present

## 2015-06-26 DIAGNOSIS — Z1389 Encounter for screening for other disorder: Secondary | ICD-10-CM | POA: Diagnosis not present

## 2015-06-26 DIAGNOSIS — Z681 Body mass index (BMI) 19 or less, adult: Secondary | ICD-10-CM | POA: Diagnosis not present

## 2015-06-26 DIAGNOSIS — F419 Anxiety disorder, unspecified: Secondary | ICD-10-CM | POA: Diagnosis not present

## 2015-06-26 DIAGNOSIS — I10 Essential (primary) hypertension: Secondary | ICD-10-CM | POA: Diagnosis not present

## 2015-07-14 ENCOUNTER — Telehealth: Payer: Self-pay | Admitting: Internal Medicine

## 2015-07-14 ENCOUNTER — Other Ambulatory Visit (HOSPITAL_BASED_OUTPATIENT_CLINIC_OR_DEPARTMENT_OTHER): Payer: PPO

## 2015-07-14 ENCOUNTER — Ambulatory Visit (HOSPITAL_BASED_OUTPATIENT_CLINIC_OR_DEPARTMENT_OTHER): Payer: PPO | Admitting: Internal Medicine

## 2015-07-14 ENCOUNTER — Encounter: Payer: Self-pay | Admitting: Internal Medicine

## 2015-07-14 VITALS — BP 150/69 | HR 79 | Temp 98.0°F | Resp 18 | Ht 64.0 in | Wt 98.8 lb

## 2015-07-14 DIAGNOSIS — C3411 Malignant neoplasm of upper lobe, right bronchus or lung: Secondary | ICD-10-CM

## 2015-07-14 DIAGNOSIS — C3412 Malignant neoplasm of upper lobe, left bronchus or lung: Secondary | ICD-10-CM | POA: Diagnosis not present

## 2015-07-14 DIAGNOSIS — M549 Dorsalgia, unspecified: Secondary | ICD-10-CM

## 2015-07-14 DIAGNOSIS — E876 Hypokalemia: Secondary | ICD-10-CM

## 2015-07-14 DIAGNOSIS — C3490 Malignant neoplasm of unspecified part of unspecified bronchus or lung: Secondary | ICD-10-CM | POA: Diagnosis not present

## 2015-07-14 DIAGNOSIS — Z5111 Encounter for antineoplastic chemotherapy: Secondary | ICD-10-CM

## 2015-07-14 LAB — CBC WITH DIFFERENTIAL/PLATELET
BASO%: 1 % (ref 0.0–2.0)
Basophils Absolute: 0 10*3/uL (ref 0.0–0.1)
EOS ABS: 0.2 10*3/uL (ref 0.0–0.5)
EOS%: 5.2 % (ref 0.0–7.0)
HCT: 28.4 % — ABNORMAL LOW (ref 34.8–46.6)
HGB: 9.2 g/dL — ABNORMAL LOW (ref 11.6–15.9)
LYMPH%: 20.8 % (ref 14.0–49.7)
MCH: 29.2 pg (ref 25.1–34.0)
MCHC: 32.5 g/dL (ref 31.5–36.0)
MCV: 89.8 fL (ref 79.5–101.0)
MONO#: 0.3 10*3/uL (ref 0.1–0.9)
MONO%: 10.3 % (ref 0.0–14.0)
NEUT%: 62.7 % (ref 38.4–76.8)
NEUTROS ABS: 2 10*3/uL (ref 1.5–6.5)
Platelets: 236 10*3/uL (ref 145–400)
RBC: 3.16 10*6/uL — AB (ref 3.70–5.45)
RDW: 14.4 % (ref 11.2–14.5)
WBC: 3.2 10*3/uL — AB (ref 3.9–10.3)
lymph#: 0.7 10*3/uL — ABNORMAL LOW (ref 0.9–3.3)

## 2015-07-14 LAB — COMPREHENSIVE METABOLIC PANEL
ALT: <9 U/L (ref 0–55)
AST: 15 U/L (ref 5–34)
Albumin: 2.8 g/dL — ABNORMAL LOW (ref 3.5–5.0)
Alkaline Phosphatase: 129 U/L (ref 40–150)
Anion Gap: 9 mEq/L (ref 3–11)
BUN: 9.8 mg/dL (ref 7.0–26.0)
CO2: 26 meq/L (ref 22–29)
Calcium: 8.4 mg/dL (ref 8.4–10.4)
Chloride: 107 mEq/L (ref 98–109)
Creatinine: 0.8 mg/dL (ref 0.6–1.1)
EGFR: 72 mL/min/{1.73_m2} — AB (ref >90)
GLUCOSE: 109 mg/dL (ref 70–140)
POTASSIUM: 3 meq/L — AB (ref 3.5–5.1)
SODIUM: 142 meq/L (ref 136–145)
TOTAL PROTEIN: 5.7 g/dL — AB (ref 6.4–8.3)
Total Bilirubin: 0.61 mg/dL (ref 0.20–1.20)

## 2015-07-14 NOTE — Telephone Encounter (Signed)
per pof to sch pt appt-gave pt copy of avs °

## 2015-07-14 NOTE — Progress Notes (Signed)
[place] Telephone:(336) [phone]   Fax:(336) [phone]  OFFICE PROGRESS NOTE  Glo Herring., MD [place] [address]  DIAGNOSIS: Recurrent non-small cell lung cancer, adenocarcinoma with positive EGFR mutation in exon 21 (L858R). The patient has multifocal disease including 2 nodules in the right lung with a suspicious tiny lesion in the left lung.  PRIOR THERAPY: Palliative radiotherapy to the enlarging right upper lobe lung mass under the care of Dr. Pablo Ledger completed on 01/06/2015.  CURRENT THERAPY:  1) Tarceva 150 mg by mouth daily status post 12 months of treatment.  INTERVAL HISTORY: [name] 75 y.o. female returns to the clinic today for follow-up visit accompanied by her daughter.The patient is feeling well today with no specific complaints except for mild fatigue. The patient denied having any significant chest pain, cough or hemoptysis. She is currently on home oxygen for the history of COPD and shortness of breath. She is tolerating her treatment with oral Tarceva fairly well with no significant adverse effects except for mild diarrhea improved with Imodium. She had repeat CBC and comprehensive metabolic panel performed earlier today and she is here for evaluation and discussion of her lab results.  MEDICAL HISTORY: Past Medical History  Diagnosis Date  . COPD (chronic obstructive pulmonary disease) ([place])   . Thyroid disease   . Pernicious anemia   . Hypertension   . AAA (abdominal aortic aneurysm) ([place])   . Chronic kidney disease   . Stroke ([place])   . Anemia of chronic disease 11/14/2014  . Lung cancer ([place])   . Primary cancer of right upper lobe of lung ([place]) 05/10/2012    ALLERGIES:  is allergic to codeine; penicillins; and pravastatin sodium.  MEDICATIONS:  Current Outpatient Prescriptions  Medication Sig Dispense Refill  . acetaminophen (TYLENOL) 500 MG tablet Take 500-1,000 mg by mouth every 6 (six) hours as needed  for moderate pain.     Marland Kitchen albuterol (PROVENTIL) (2.5 MG/3ML) 0.083% nebulizer solution Take 2.5 mg by nebulization every 4 (four) hours. Or Four times daily    . ALPRAZolam (XANAX) 1 MG tablet Take 1 mg by mouth 4 (four) times daily as needed. For anxiety and/or sleep    . aspirin EC 81 MG EC tablet Take 1 tablet (81 mg total) by mouth daily.    . clindamycin (CLEOCIN T) 1 % external solution Apply topically 2 (two) times daily. (Patient taking differently: Apply 1 application topically as needed. ) 30 mL 1  . Cyanocobalamin (VITAMIN B-12) 1000 MCG SUBL Place 1 tablet under the tongue daily.    . ferrous sulfate 325 (65 FE) MG tablet Take 325 mg by mouth 3 (three) times daily with meals.    . Fluticasone-Salmeterol (ADVAIR) 250-50 MCG/DOSE AEPB Inhale 1 puff into the lungs every 12 (twelve) hours.    Marland Kitchen guaiFENesin (MUCINEX) 600 MG 12 hr tablet Take 600 mg by mouth daily as needed for cough.     . levothyroxine (SYNTHROID, LEVOTHROID) 100 MCG tablet Take 100 mcg by mouth daily before breakfast.     . loperamide (IMODIUM) 2 MG capsule Take 2 mg by mouth as needed for diarrhea or loose stools.    Marland Kitchen loratadine (CLARITIN) 10 MG tablet Take 10 mg by mouth daily as needed for allergies.     . mirtazapine (REMERON) 7.5 MG tablet Take 7.5 mg by mouth at bedtime.  12  . NON FORMULARY OXYGEN      24/7    .  potassium chloride SA (K-DUR,KLOR-CON) 20 MEQ tablet Take 1 tablet (20 mEq total) by mouth daily. 7 tablet 0  . sulfamethoxazole-trimethoprim (BACTRIM DS,SEPTRA DS) 800-160 MG tablet Take 1 tablet by mouth 2 (two) times daily. 14 tablet 0  . TARCEVA 150 MG tablet TAKE 1 TABLET BY MOUTH DAILY. TAKE ON AN EMPTY STOMACH 1 HOUR BEFORE MEALS OR 2 HOURS AFTER. 30 tablet 2  . traMADol (ULTRAM) 50 MG tablet Take 1 tablet (50 mg total) by mouth every 12 (twelve) hours as needed. 50 tablet 1  . Vitamin D, Ergocalciferol, (DRISDOL) 50000 UNITS CAPS capsule Take 50,000 Units by mouth every 30 (thirty) days. Fridays      No current facility-administered medications for this visit.    SURGICAL HISTORY:  Past Surgical History  Procedure Laterality Date  . Abdominal hysterectomy    . Appendectomy    . Cholecystectomy    . Tubal ligation    . Chest tube insertion  03/08/2012    Procedure: CHEST TUBE INSERTION;  Surgeon: Ivin Poot, MD;  Location: [place];  Service: Thoracic;  Laterality: Right;  . Video assisted thoracoscopy  03/17/2012    Procedure: VIDEO ASSISTED THORACOSCOPY;  Surgeon: Ivin Poot, MD;  Location: [place];  Service: Thoracic;  Laterality: Right;  . Resection of apical bleb  03/17/2012    Procedure: RESECTION OF APICAL BLEB;  Surgeon: Ivin Poot, MD;  Location: [place];  Service: Thoracic;  Laterality: Right;  stapling of bleb  . Colonoscopy N/A 04/29/2014    Procedure: COLONOSCOPY;  Surgeon: Daneil Dolin, MD;  [facility];  Service: Endoscopy;  Laterality: N/A;  1245pm  . Esophagogastroduodenoscopy N/A 04/29/2014    Procedure: ESOPHAGOGASTRODUODENOSCOPY (EGD);  Surgeon: Daneil Dolin, MD;  [facility];  Service: Endoscopy;  Laterality: N/A;  . Savory dilation N/A 04/29/2014    Procedure: SAVORY DILATION;  Surgeon: Daneil Dolin, MD;  [facility];  Service: Endoscopy;  Laterality: N/A;  Venia Minks dilation N/A 04/29/2014    Procedure: Venia Minks DILATION;  Surgeon: Daneil Dolin, MD;  [facility];  Service: Endoscopy;  Laterality: N/A;  . Tee without cardioversion N/A 11/15/2014    Procedure: TRANSESOPHAGEAL ECHOCARDIOGRAM (TEE);  Surgeon: Sueanne Margarita, MD;  Location: [facility] ENDOSCOPY;  Service: Cardiovascular;  Laterality: N/A;    REVIEW OF SYSTEMS:  A comprehensive review of systems was negative except for: Constitutional: positive for fatigue Gastrointestinal: positive for diarrhea   PHYSICAL EXAMINATION: General appearance: alert, cooperative, fatigued and no distress Head: Normocephalic, without obvious abnormality,  atraumatic Neck: no adenopathy, no JVD, supple, symmetrical, trachea midline and thyroid not enlarged, symmetric, no tenderness/mass/nodules Lymph nodes: Cervical, supraclavicular, and axillary nodes normal. Resp: clear to auscultation bilaterally Back: symmetric, no curvature. ROM normal. No CVA tenderness. Cardio: regular rate and rhythm, S1, S2 normal, no murmur, click, rub or gallop GI: soft, non-tender; bowel sounds normal; no masses,  no organomegaly Extremities: edema 1+ Neurologic: Alert and oriented X 3, normal strength and tone. Normal symmetric reflexes. Normal coordination and gait  ECOG PERFORMANCE STATUS: 2 - Symptomatic, <50% confined to bed  Blood pressure 150/69, pulse 79, temperature 98 F (36.7 C), temperature source Oral, resp. rate 18, height 5' 4"  (1.626 m), weight 98 lb 12.8 oz (44.815 kg), SpO2 100 %.  LABORATORY DATA: Lab Results  Component Value Date   WBC 3.2* 07/14/2015   HGB 9.2* 07/14/2015   HCT 28.4* 07/14/2015   MCV 89.8 07/14/2015  PLT 236 07/14/2015      Chemistry      Component Value Date/Time   NA 142 07/14/2015 1000   NA 140 11/13/2014 1744   K 3.0* 07/14/2015 1000   K 3.1* 11/13/2014 1744   CL 103 11/13/2014 1744   CO2 26 07/14/2015 1000   CO2 26 11/13/2014 1736   BUN 9.8 07/14/2015 1000   BUN 16 11/13/2014 1744   CREATININE 0.8 07/14/2015 1000   CREATININE 1.00 11/13/2014 1744   CREATININE 1.13* 03/14/2013 1210      Component Value Date/Time   CALCIUM 8.4 07/14/2015 1000   CALCIUM 8.7* 11/13/2014 1736   ALKPHOS 129 07/14/2015 1000   ALKPHOS 118 11/13/2014 1736   AST 15 07/14/2015 1000   AST 17 11/13/2014 1736   ALT <9 07/14/2015 1000   ALT 11* 11/13/2014 1736   BILITOT 0.61 07/14/2015 1000   BILITOT 0.7 11/13/2014 1736       RADIOGRAPHIC STUDIES: No results found.  ASSESSMENT AND PLAN: This is a very pleasant 75 years old with metastatic non-small cell lung cancer, adenocarcinoma with positive EGFR mutation who is  currently undergoing treatment with Tarceva 150 mg by mouth daily status post 12 months of treatment. The patient is tolerating her treatment fairly well with no significant adverse effects except for diarrhea improved with Imodium. I recommended for the patient to continue her treatment with Tarceva with the same dose for now. For the back pain, she will continue on pain medication for now. He'll see the patient back for follow-up visit in one month's for reevaluation after repeating CT scan of the chest, abdomen and pelvis for restaging of her disease. For the hypokalemia, the patient will resume her potassium supplement at home. She was advised to call immediately if she has any concerning symptoms in the interval. The patient voices understanding of current disease status and treatment options and is in agreement with the current care plan.  All questions were answered. The patient knows to call the clinic with any problems, questions or concerns. We can certainly see the patient much sooner if necessary.  Disclaimer: This note was dictated with voice recognition software. Similar sounding words can inadvertently be transcribed and may not be corrected upon review.

## 2015-07-21 MED FILL — TARCEVA 150 MG TABLET: 150 | 30 days supply | Qty: 30 | Fill #2

## 2015-07-24 DIAGNOSIS — J439 Emphysema, unspecified: Secondary | ICD-10-CM | POA: Diagnosis not present

## 2015-07-24 DIAGNOSIS — J449 Chronic obstructive pulmonary disease, unspecified: Secondary | ICD-10-CM | POA: Diagnosis not present

## 2015-08-08 ENCOUNTER — Other Ambulatory Visit (HOSPITAL_BASED_OUTPATIENT_CLINIC_OR_DEPARTMENT_OTHER): Payer: PPO

## 2015-08-08 ENCOUNTER — Encounter (HOSPITAL_COMMUNITY): Payer: Self-pay

## 2015-08-08 ENCOUNTER — Ambulatory Visit (HOSPITAL_COMMUNITY)
Admission: RE | Admit: 2015-08-08 | Discharge: 2015-08-08 | Disposition: A | Discharge status: Home / Self Care | Payer: PPO | Source: Ambulatory Visit | Attending: Internal Medicine | Admitting: Internal Medicine

## 2015-08-08 DIAGNOSIS — I714 Abdominal aortic aneurysm, without rupture: Secondary | ICD-10-CM | POA: Diagnosis not present

## 2015-08-08 DIAGNOSIS — C3411 Malignant neoplasm of upper lobe, right bronchus or lung: Secondary | ICD-10-CM | POA: Insufficient documentation

## 2015-08-08 DIAGNOSIS — Z5111 Encounter for antineoplastic chemotherapy: Secondary | ICD-10-CM | POA: Diagnosis not present

## 2015-08-08 DIAGNOSIS — K861 Other chronic pancreatitis: Secondary | ICD-10-CM | POA: Diagnosis not present

## 2015-08-08 DIAGNOSIS — R918 Other nonspecific abnormal finding of lung field: Secondary | ICD-10-CM | POA: Diagnosis not present

## 2015-08-08 LAB — CBC WITH DIFFERENTIAL/PLATELET
BASO%: 0.2 % (ref 0.0–2.0)
Basophils Absolute: 0 10*3/uL (ref 0.0–0.1)
EOS%: 5.6 % (ref 0.0–7.0)
Eosinophils Absolute: 0.3 10*3/uL (ref 0.0–0.5)
HCT: 29.6 % — ABNORMAL LOW (ref 34.8–46.6)
HGB: 9.6 g/dL — ABNORMAL LOW (ref 11.6–15.9)
LYMPH%: 16.5 % (ref 14.0–49.7)
MCH: 29.2 pg (ref 25.1–34.0)
MCHC: 32.4 g/dL (ref 31.5–36.0)
MCV: 90 fL (ref 79.5–101.0)
MONO#: 0.4 10*3/uL (ref 0.1–0.9)
MONO%: 8.5 % (ref 0.0–14.0)
NEUT%: 69.2 % (ref 38.4–76.8)
NEUTROS ABS: 3.3 10*3/uL (ref 1.5–6.5)
Platelets: 259 10*3/uL (ref 145–400)
RBC: 3.29 10*6/uL — AB (ref 3.70–5.45)
RDW: 13.6 % (ref 11.2–14.5)
WBC: 4.8 10*3/uL (ref 3.9–10.3)
lymph#: 0.8 10*3/uL — ABNORMAL LOW (ref 0.9–3.3)

## 2015-08-08 LAB — COMPREHENSIVE METABOLIC PANEL
ALT: 9 U/L (ref 0–55)
AST: 15 U/L (ref 5–34)
Albumin: 3.2 g/dL — ABNORMAL LOW (ref 3.5–5.0)
Alkaline Phosphatase: 145 U/L (ref 40–150)
Anion Gap: 7 mEq/L (ref 3–11)
BILIRUBIN TOTAL: 0.87 mg/dL (ref 0.20–1.20)
BUN: 17.1 mg/dL (ref 7.0–26.0)
CO2: 25 meq/L (ref 22–29)
Calcium: 8.8 mg/dL (ref 8.4–10.4)
Chloride: 107 mEq/L (ref 98–109)
Creatinine: 0.9 mg/dL (ref 0.6–1.1)
EGFR: 65 mL/min/{1.73_m2} — AB (ref >90)
GLUCOSE: 87 mg/dL (ref 70–140)
Potassium: 4.4 mEq/L (ref 3.5–5.1)
SODIUM: 139 meq/L (ref 136–145)
TOTAL PROTEIN: 6.2 g/dL — AB (ref 6.4–8.3)

## 2015-08-08 MED ORDER — IOPAMIDOL (ISOVUE-300) INJECTION 61%
75.0000 mL | Freq: Once | INTRAVENOUS | Status: AC | PRN
  Administered 2015-08-08: 75 mL via INTRAVENOUS

## 2015-08-12 ENCOUNTER — Other Ambulatory Visit (HOSPITAL_BASED_OUTPATIENT_CLINIC_OR_DEPARTMENT_OTHER): Payer: PPO

## 2015-08-12 ENCOUNTER — Telehealth: Payer: Self-pay | Admitting: Internal Medicine

## 2015-08-12 ENCOUNTER — Encounter: Payer: Self-pay | Admitting: Internal Medicine

## 2015-08-12 ENCOUNTER — Ambulatory Visit (HOSPITAL_BASED_OUTPATIENT_CLINIC_OR_DEPARTMENT_OTHER): Payer: PPO | Admitting: Internal Medicine

## 2015-08-12 VITALS — BP 146/50 | HR 94 | Temp 97.9°F | Resp 18 | Ht 64.0 in | Wt 99.0 lb

## 2015-08-12 DIAGNOSIS — M549 Dorsalgia, unspecified: Secondary | ICD-10-CM | POA: Diagnosis not present

## 2015-08-12 DIAGNOSIS — Z5111 Encounter for antineoplastic chemotherapy: Secondary | ICD-10-CM

## 2015-08-12 DIAGNOSIS — C3411 Malignant neoplasm of upper lobe, right bronchus or lung: Secondary | ICD-10-CM

## 2015-08-12 DIAGNOSIS — E876 Hypokalemia: Secondary | ICD-10-CM | POA: Diagnosis not present

## 2015-08-12 NOTE — Telephone Encounter (Signed)
Gave pt apt & avs °

## 2015-08-12 NOTE — Progress Notes (Signed)
[place] [phone]   [phone]  OFFICE PROGRESS NOTE  Glo Herring., MD [place] [address]  DIAGNOSIS: Recurrent non-small cell lung cancer, adenocarcinoma with positive EGFR mutation in exon 21 ([id number]). The patient has multifocal disease including 2 nodules in the right lung with a suspicious tiny lesion in the left lung.  PRIOR THERAPY: Palliative radiotherapy to the enlarging right upper lobe lung mass under the care of Dr. Pablo Ledger completed on 01/06/2015.  CURRENT THERAPY:  1) Tarceva 150 mg by mouth daily status post 13 months of treatment.  INTERVAL HISTORY: [name] 75 y.o. Reese returns to the clinic today for follow-up visit accompanied by her daughter.The patient is feeling well today with no specific complaints except for mild fatigue and feeling cold all the time. The patient denied having any significant chest pain, cough or hemoptysis. She is currently on home oxygen for the history of COPD and shortness of breath. She is tolerating her treatment with oral Tarceva fairly well with no significant adverse effects except for mild diarrhea improved with Imodium. She had repeat CBC and comprehensive metabolic panel as well as CT scan of the chest, abdomen and pelvis performed recently and she is here for evaluation and discussion of her lab and scan results.  MEDICAL HISTORY: Past Medical History  Diagnosis Date  . COPD (chronic obstructive pulmonary disease) ([place])   . Thyroid disease   . Pernicious anemia   . Hypertension   . AAA (abdominal aortic aneurysm) ([place])   . Chronic kidney disease   . Stroke ([place])   . Anemia of chronic disease 11/14/2014  . Lung cancer ([place])   . Primary cancer of right upper lobe of lung ([place]) 05/10/2012    ALLERGIES:  is allergic to codeine; penicillins; and pravastatin sodium.  MEDICATIONS:  Current Outpatient Prescriptions  Medication Sig Dispense Refill  .  acetaminophen (TYLENOL) 500 MG tablet Take 500-1,000 mg by mouth every 6 (six) hours as needed for moderate pain.     Marland Kitchen albuterol (PROVENTIL) (2.5 MG/3ML) 0.083% nebulizer solution Take 2.5 mg by nebulization every 4 (four) hours. Or Four times daily    . ALPRAZolam (XANAX) 1 MG tablet Take 1 mg by mouth 4 (four) times daily as needed. For anxiety and/or sleep    . amLODipine (NORVASC) 2.5 MG tablet Take 2.5 mg by mouth daily.  11  . aspirin EC 81 MG EC tablet Take 1 tablet (81 mg total) by mouth daily.    . clindamycin (CLEOCIN T) 1 % external solution Apply topically 2 (two) times daily. (Patient taking differently: Apply 1 application topically as needed. ) 30 mL 1  . Cyanocobalamin (VITAMIN B-12) 1000 MCG SUBL Place 1 tablet under the tongue daily.    . ferrous sulfate 325 (65 FE) MG tablet Take 325 mg by mouth 3 (three) times daily with meals.    . Fluticasone-Salmeterol (ADVAIR) 250-50 MCG/DOSE AEPB Inhale 1 puff into the lungs every 12 (twelve) hours.    Marland Kitchen guaiFENesin (MUCINEX) 600 MG 12 hr tablet Take 600 mg by mouth daily as needed for cough.     . levothyroxine (SYNTHROID, LEVOTHROID) 100 MCG tablet Take 100 mcg by mouth daily before breakfast.     . loperamide (IMODIUM) 2 MG capsule Take 2 mg by mouth as needed for diarrhea or loose stools.    Marland Kitchen loratadine (CLARITIN) 10 MG tablet Take 10 mg by mouth daily as needed for allergies.     Marland Kitchen  mirtazapine (REMERON) 7.5 MG tablet Take 7.5 mg by mouth at bedtime.  12  . NON FORMULARY OXYGEN      24/7    . potassium chloride SA (K-DUR,KLOR-CON) 20 MEQ tablet Take 1 tablet (20 mEq total) by mouth daily. 7 tablet 0  . TARCEVA 150 MG tablet TAKE 1 TABLET BY MOUTH DAILY. TAKE ON AN EMPTY STOMACH 1 HOUR BEFORE MEALS OR 2 HOURS AFTER. 30 tablet 2  . traMADol (ULTRAM) 50 MG tablet Take 1 tablet (50 mg total) by mouth every 12 (twelve) hours as needed. 50 tablet 1  . Vitamin D, Ergocalciferol, (DRISDOL) 50000 UNITS CAPS capsule Take 50,000 Units by mouth  every 30 (thirty) days. Fridays     No current facility-administered medications for this visit.    SURGICAL HISTORY:  Past Surgical History  Procedure Laterality Date  . Abdominal hysterectomy    . Appendectomy    . Cholecystectomy    . Tubal ligation    . Chest tube insertion  03/08/2012    Procedure: CHEST TUBE INSERTION;  Surgeon: Ivin Poot, MD;  Location: [facility];  Service: Thoracic;  Laterality: Right;  . Video assisted thoracoscopy  03/17/2012    Procedure: VIDEO ASSISTED THORACOSCOPY;  Surgeon: Ivin Poot, MD;  Location: [facility];  Service: Thoracic;  Laterality: Right;  . Resection of apical bleb  03/17/2012    Procedure: RESECTION OF APICAL BLEB;  Surgeon: Ivin Poot, MD;  Location: [facility];  Service: Thoracic;  Laterality: Right;  stapling of bleb  . Colonoscopy N/A 04/29/2014    Procedure: COLONOSCOPY;  Surgeon: Daneil Dolin, MD;  [facility];  Service: Endoscopy;  Laterality: N/A;  1245pm  . Esophagogastroduodenoscopy N/A 04/29/2014    Procedure: ESOPHAGOGASTRODUODENOSCOPY (EGD);  Surgeon: Daneil Dolin, MD;  [facility];  Service: Endoscopy;  Laterality: N/A;  . [place] dilation N/A 04/29/2014    Procedure: SAVORY DILATION;  Surgeon: Daneil Dolin, MD;  [facility];  Service: Endoscopy;  Laterality: N/A;  Venia Minks dilation N/A 04/29/2014    Procedure: Venia Minks DILATION;  Surgeon: Daneil Dolin, MD;  [facility];  Service: Endoscopy;  Laterality: N/A;  . Tee without cardioversion N/A 11/15/2014    Procedure: TRANSESOPHAGEAL ECHOCARDIOGRAM (TEE);  Surgeon: Sueanne Margarita, MD;  Location[facility];  Service: Cardiovascular;  Laterality: N/A;    REVIEW OF SYSTEMS:  Constitutional: positive for fatigue Eyes: negative Ears, nose, mouth, throat, and face: negative Respiratory: positive for dyspnea on exertion Cardiovascular: negative Gastrointestinal: negative Genitourinary:negative Integument/breast:  negative Hematologic/lymphatic: negative Musculoskeletal:negative Neurological: negative Behavioral/Psych: negative Endocrine: negative Allergic/Immunologic: negative   PHYSICAL EXAMINATION: General appearance: alert, cooperative, fatigued and no distress Head: Normocephalic, without obvious abnormality, atraumatic Neck: no adenopathy, no JVD, supple, symmetrical, trachea midline and thyroid not enlarged, symmetric, no tenderness/mass/nodules Lymph nodes: Cervical, supraclavicular, and axillary nodes normal. Resp: clear to auscultation bilaterally Back: symmetric, no curvature. ROM normal. No CVA tenderness. Cardio: regular rate and rhythm, S1, S2 normal, no murmur, click, rub or gallop GI: soft, non-tender; bowel sounds normal; no masses,  no organomegaly Extremities: edema 1+ Neurologic: Alert and oriented X 3, normal strength and tone. Normal symmetric reflexes. Normal coordination and gait  ECOG PERFORMANCE STATUS: 2 - Symptomatic, <50% confined to bed  Blood pressure 146/50, pulse 94, temperature 97.9 F (36.6 C), temperature source Oral, resp. rate 18, height _0  (1.626 m), weight 99 lb (44.906 kg), SpO2 98 %.  LABORATORY DATA: Lab Results  Component Value Date   WBC 4.8 08/08/2015   HGB 9.6* 08/08/2015   HCT 29.6* 08/08/2015   MCV 90.0 08/08/2015   PLT 259 08/08/2015      Chemistry      Component Value Date/Time   NA 139 08/08/2015 1057   NA 140 11/13/2014 1744   K 4.4 08/08/2015 1057   K 3.1* 11/13/2014 1744   CL 103 11/13/2014 1744   CO2 25 08/08/2015 1057   CO2 26 11/13/2014 1736   BUN 17.1 08/08/2015 1057   BUN 16 11/13/2014 1744   CREATININE 0.9 08/08/2015 1057   CREATININE 1.00 11/13/2014 1744   CREATININE 1.13* 03/14/2013 1210      Component Value Date/Time   CALCIUM 8.8 08/08/2015 1057   CALCIUM 8.7* 11/13/2014 1736   ALKPHOS 145 08/08/2015 1057   ALKPHOS 118 11/13/2014 1736   AST 15 08/08/2015 1057   AST 17 11/13/2014 1736   ALT 9 08/08/2015  1057   ALT 11* 11/13/2014 1736   BILITOT 0.87 08/08/2015 1057   BILITOT 0.7 11/13/2014 1736       RADIOGRAPHIC STUDIES: Ct Chest W Contrast  08/08/2015  CLINICAL DATA:  Restaging right lung cancer, initial diagnosis 2013. EXAM: CT CHEST, ABDOMEN, AND PELVIS WITH CONTRAST TECHNIQUE: Multidetector CT imaging of the chest, abdomen and pelvis was performed following the standard protocol during bolus administration of intravenous contrast. CONTRAST:  46m ISOVUE-300 IOPAMIDOL (ISOVUE-300) INJECTION 61% COMPARISON:  03/03/2015 FINDINGS: CT CHEST FINDINGS Mediastinum/Lymph Nodes: No breast masses are identified. No supraclavicular or axillary lymphadenopathy. Small scattered lymph nodes are stable. The heart is normal in size. No pericardial effusion. Stable tortuosity, ectasia and calcification involving the thoracic aorta. Stable mild pulmonary artery enlargement suggesting pulmonary hypertension. No mediastinal or hilar mass adenopathy. Small scattered lymph nodes are stable. 12.5 mm pretracheal node on image number 23 is unchanged. Small aortopulmonary window nodes are also stable. 8 mm right hilar node on image 23 is stable. Lungs/Pleura: Significant interval progression of extensive pleural thickening and ill-defined airspace consolidation at the lung apex and in the posterior aspect of the right upper lung. Findings concerning for recurrent tumor. Recommend PET-CT or biopsy or further evaluation. Stable underlying advanced emphysematous changes and pulmonary scarring. New none New right lung pulmonary nodules worrisome for metastatic disease. 9.5 mm rounded subpleural nodular density on image number 72 is indeterminate. This was not definitely present on the prior study. 9.5 mm cavitary lesion in the right upper lobe on image 79. 5 mm right upper lobe pulmonary nodule on image number 60. 4 mm right lower lobe pulmonary nodule on image 60 11 mm right lower lobe pulmonary nodule on image 67. Left lung  lesions include: 8 mm subpleural left lower lobe pulmonary nodule on image number 20. 6 mm nodule in the left lower lobe on image number 116. 5.5 mm nodule in the left upper lobe on image number 64. Musculoskeletal: Stable sclerotic lesion in the T3 vertebral body. No new lesions. CT ABDOMEN PELVIS FINDINGS Hepatobiliary: No focal hepatic lesions to suggest metastatic disease. Mild central intrahepatic and extrahepatic biliary dilatation likely due to prior cholecystectomy. Pancreas: Changes of chronic calcific pancreatitis a markedly atrophied pancreas and dilated pancreatic duct. Spleen: Within normal limits in size.  No focal lesions. Adrenals/Urinary Tract: The adrenal glands are unremarkable and stable. Bilateral renal artery calcifications. No definite collecting system calculi. No ureteral or bladder calculi. No renal mass. Stomach/Bowel: The stomach, duodenum, small bowel and colon are unremarkable. No inflammatory changes, mass lesions  or obstructive findings. There is a large amount of stool throughout the colon suggesting constipation. Vascular/Lymphatic: 4.8 x 4.1 cm infrarenal abdominal aortic aneurysm is stable. Moderate mural thrombus. Advanced atherosclerotic calcifications involving the aorta, iliac artery and branch vessels. Small scattered mesenteric and retroperitoneal lymph nodes but no mass or overt adenopathy. Reproductive: The uterus and ovaries are surgically absent. Other: The bladder appears normal. No pelvic mass or adenopathy. No inguinal mass or adenopathy. Musculoskeletal: No significant bony findings. IMPRESSION: 1. Findings concerning for recurrent tumor in the right lung along with pulmonary metastatic disease. 2. No findings for abdominal/metastatic disease. 3. Stable T3 lesion.  No new bone lesions. 4. Stable changes of chronic calcific pancreatitis. 5. Stable 4.8 cm infrarenal abdominal aortic aneurysm. 6. Status post cholecystectomy with mild biliary dilatation. Electronically  Signed   By: Marijo Sanes M.D.   On: 08/08/2015 15:17   Ct Abdomen Pelvis W Contrast  08/08/2015  CLINICAL DATA:  Restaging right lung cancer, initial diagnosis 2013. EXAM: CT CHEST, ABDOMEN, AND PELVIS WITH CONTRAST TECHNIQUE: Multidetector CT imaging of the chest, abdomen and pelvis was performed following the standard protocol during bolus administration of intravenous contrast. CONTRAST:  67m ISOVUE-300 IOPAMIDOL (ISOVUE-300) INJECTION 61% COMPARISON:  03/03/2015 FINDINGS: CT CHEST FINDINGS Mediastinum/Lymph Nodes: No breast masses are identified. No supraclavicular or axillary lymphadenopathy. Small scattered lymph nodes are stable. The heart is normal in size. No pericardial effusion. Stable tortuosity, ectasia and calcification involving the thoracic aorta. Stable mild pulmonary artery enlargement suggesting pulmonary hypertension. No mediastinal or hilar mass adenopathy. Small scattered lymph nodes are stable. 12.5 mm pretracheal node on image number 23 is unchanged. Small aortopulmonary window nodes are also stable. 8 mm right hilar node on image 23 is stable. Lungs/Pleura: Significant interval progression of extensive pleural thickening and ill-defined airspace consolidation at the lung apex and in the posterior aspect of the right upper lung. Findings concerning for recurrent tumor. Recommend PET-CT or biopsy or further evaluation. Stable underlying advanced emphysematous changes and pulmonary scarring. New none New right lung pulmonary nodules worrisome for metastatic disease. 9.5 mm rounded subpleural nodular density on image number 72 is indeterminate. This was not definitely present on the prior study. 9.5 mm cavitary lesion in the right upper lobe on image 79. 5 mm right upper lobe pulmonary nodule on image number 60. 4 mm right lower lobe pulmonary nodule on image 60 11 mm right lower lobe pulmonary nodule on image 67. Left lung lesions include: 8 mm subpleural left lower lobe pulmonary nodule  on image number 20. 6 mm nodule in the left lower lobe on image number 116. 5.5 mm nodule in the left upper lobe on image number 64. Musculoskeletal: Stable sclerotic lesion in the T3 vertebral body. No new lesions. CT ABDOMEN PELVIS FINDINGS Hepatobiliary: No focal hepatic lesions to suggest metastatic disease. Mild central intrahepatic and extrahepatic biliary dilatation likely due to prior cholecystectomy. Pancreas: Changes of chronic calcific pancreatitis a markedly atrophied pancreas and dilated pancreatic duct. Spleen: Within normal limits in size.  No focal lesions. Adrenals/Urinary Tract: The adrenal glands are unremarkable and stable. Bilateral renal artery calcifications. No definite collecting system calculi. No ureteral or bladder calculi. No renal mass. Stomach/Bowel: The stomach, duodenum, small bowel and colon are unremarkable. No inflammatory changes, mass lesions or obstructive findings. There is a large amount of stool throughout the colon suggesting constipation. Vascular/Lymphatic: 4.8 x 4.1 cm infrarenal abdominal aortic aneurysm is stable. Moderate mural thrombus. Advanced atherosclerotic calcifications involving the aorta, iliac artery and  branch vessels. Small scattered mesenteric and retroperitoneal lymph nodes but no mass or overt adenopathy. Reproductive: The uterus and ovaries are surgically absent. Other: The bladder appears normal. No pelvic mass or adenopathy. No inguinal mass or adenopathy. Musculoskeletal: No significant bony findings. IMPRESSION: 1. Findings concerning for recurrent tumor in the right lung along with pulmonary metastatic disease. 2. No findings for abdominal/metastatic disease. 3. Stable T3 lesion.  No new bone lesions. 4. Stable changes of chronic calcific pancreatitis. 5. Stable 4.8 cm infrarenal abdominal aortic aneurysm. 6. Status post cholecystectomy with mild biliary dilatation. Electronically Signed   By: Marijo Sanes M.D.   On: 08/08/2015 15:17     ASSESSMENT AND PLAN: This is a very pleasant 75 years old with metastatic non-small cell lung cancer, adenocarcinoma with positive EGFR mutation who is currently undergoing treatment with Tarceva 150 mg by mouth daily status post 12 months of treatment. The patient is tolerating her treatment fairly well with no significant adverse effects except for diarrhea improved with Imodium. The recent CT scan of the chest, abdomen and pelvis showed findings concerning for recurrent tumor in the right lung with pulmonary metastatic disease. I personally reviewed the scan images and discuss the results with the patient and her daughter. I recommended for the patient to have plasma test for EGFR mutation analysis to rule out the presence of any EGFR resistant mutation. In the meantime,I recommended for the patient to continue her treatment with Tarceva with the same dose for now. For the back pain, she will continue on pain medication for now. He'll see the patient back for follow-up visit in one month for reevaluation with repeat blood work.  For the history of hypokalemia, the patient will resume her potassium supplement at home every other day. She was advised to call immediately if she has any concerning symptoms in the interval. The patient voices understanding of current disease status and treatment options and is in agreement with the current care plan.  All questions were answered. The patient knows to call the clinic with any problems, questions or concerns. We can certainly see the patient much sooner if necessary.  Disclaimer: This note was dictated with voice recognition software. Similar sounding words can inadvertently be transcribed and may not be corrected upon review.

## 2015-08-19 LAB — EPIDERMAL GROWTH FACTOR RECEPTOR (EGFR) MUTATION ANALYSIS

## 2015-08-21 ENCOUNTER — Other Ambulatory Visit: Payer: Self-pay | Admitting: Internal Medicine

## 2015-08-21 MED FILL — TARCEVA 150 MG TABLET: 150 | 30 days supply | Qty: 30 | Fill #0

## 2015-08-23 DIAGNOSIS — J439 Emphysema, unspecified: Secondary | ICD-10-CM | POA: Diagnosis not present

## 2015-08-23 DIAGNOSIS — J449 Chronic obstructive pulmonary disease, unspecified: Secondary | ICD-10-CM | POA: Diagnosis not present

## 2015-09-09 DIAGNOSIS — J9610 Chronic respiratory failure, unspecified whether with hypoxia or hypercapnia: Secondary | ICD-10-CM | POA: Diagnosis not present

## 2015-09-09 DIAGNOSIS — J449 Chronic obstructive pulmonary disease, unspecified: Secondary | ICD-10-CM | POA: Diagnosis not present

## 2015-09-09 DIAGNOSIS — I714 Abdominal aortic aneurysm, without rupture: Secondary | ICD-10-CM | POA: Diagnosis not present

## 2015-09-09 DIAGNOSIS — Z01812 Encounter for preprocedural laboratory examination: Secondary | ICD-10-CM | POA: Diagnosis not present

## 2015-09-11 ENCOUNTER — Ambulatory Visit (HOSPITAL_BASED_OUTPATIENT_CLINIC_OR_DEPARTMENT_OTHER): Payer: PPO | Admitting: Internal Medicine

## 2015-09-11 ENCOUNTER — Telehealth: Payer: Self-pay | Admitting: Internal Medicine

## 2015-09-11 ENCOUNTER — Other Ambulatory Visit (HOSPITAL_BASED_OUTPATIENT_CLINIC_OR_DEPARTMENT_OTHER): Payer: PPO

## 2015-09-11 ENCOUNTER — Encounter: Payer: Self-pay | Admitting: Internal Medicine

## 2015-09-11 ENCOUNTER — Encounter: Payer: Self-pay | Admitting: *Deleted

## 2015-09-11 VITALS — BP 176/70 | HR 103 | Temp 97.9°F | Resp 17 | Wt 99.0 lb

## 2015-09-11 DIAGNOSIS — M549 Dorsalgia, unspecified: Secondary | ICD-10-CM | POA: Diagnosis not present

## 2015-09-11 DIAGNOSIS — C3411 Malignant neoplasm of upper lobe, right bronchus or lung: Secondary | ICD-10-CM | POA: Diagnosis not present

## 2015-09-11 DIAGNOSIS — Z5111 Encounter for antineoplastic chemotherapy: Secondary | ICD-10-CM

## 2015-09-11 LAB — CBC WITH DIFFERENTIAL/PLATELET
BASO%: 0.3 % (ref 0.0–2.0)
Basophils Absolute: 0 10*3/uL (ref 0.0–0.1)
EOS%: 4.5 % (ref 0.0–7.0)
Eosinophils Absolute: 0.1 10*3/uL (ref 0.0–0.5)
HCT: 29.8 % — ABNORMAL LOW (ref 34.8–46.6)
HEMOGLOBIN: 9.6 g/dL — AB (ref 11.6–15.9)
LYMPH%: 19.1 % (ref 14.0–49.7)
MCH: 28.6 pg (ref 25.1–34.0)
MCHC: 32.2 g/dL (ref 31.5–36.0)
MCV: 88.7 fL (ref 79.5–101.0)
MONO#: 0.3 10*3/uL (ref 0.1–0.9)
MONO%: 10 % (ref 0.0–14.0)
NEUT%: 66.1 % (ref 38.4–76.8)
NEUTROS ABS: 2 10*3/uL (ref 1.5–6.5)
Platelets: 234 10*3/uL (ref 145–400)
RBC: 3.36 10*6/uL — ABNORMAL LOW (ref 3.70–5.45)
RDW: 14.1 % (ref 11.2–14.5)
WBC: 3.1 10*3/uL — AB (ref 3.9–10.3)
lymph#: 0.6 10*3/uL — ABNORMAL LOW (ref 0.9–3.3)

## 2015-09-11 LAB — COMPREHENSIVE METABOLIC PANEL
ALBUMIN: 3.4 g/dL — AB (ref 3.5–5.0)
ALK PHOS: 142 U/L (ref 40–150)
ALT: <9 U/L (ref 0–55)
AST: 15 U/L (ref 5–34)
Anion Gap: 9 mEq/L (ref 3–11)
BILIRUBIN TOTAL: 0.68 mg/dL (ref 0.20–1.20)
BUN: 12.1 mg/dL (ref 7.0–26.0)
CO2: 24 meq/L (ref 22–29)
Calcium: 8.8 mg/dL (ref 8.4–10.4)
Chloride: 107 mEq/L (ref 98–109)
Creatinine: 0.9 mg/dL (ref 0.6–1.1)
EGFR: 67 mL/min/{1.73_m2} — ABNORMAL LOW (ref >90)
GLUCOSE: 92 mg/dL (ref 70–140)
POTASSIUM: 3.8 meq/L (ref 3.5–5.1)
SODIUM: 140 meq/L (ref 136–145)
TOTAL PROTEIN: 6.5 g/dL (ref 6.4–8.3)

## 2015-09-11 NOTE — Progress Notes (Signed)
Oncology Nurse Navigator Documentation  Oncology Nurse Navigator Flowsheets 09/11/2015  Navigator Encounter Type Clinic/MDC  Patient Visit Type MedOnc  Treatment Phase Treatment  Barriers/Navigation Needs No barriers at this time  Interventions None required  Acuity Level 1  Acuity Level 1 Minimal follow up required  Time Spent with Patient 71   Spoke with patient and daughter today at [facility].  She is doing well today.  She has no questions about [place]

## 2015-09-11 NOTE — Progress Notes (Signed)
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 The patient has multifocal disease including 2 nodules in the right lung with a suspicious tiny lesion in the left lung.  PRIOR THERAPY: Palliative radiotherapy to the enlarging right upper lobe lung mass under the care of Dr. Pablo Ledger completed on 01/06/2015.  CURRENT THERAPY:  1) Tarceva 150 mg by mouth daily status post 14 months of treatment.  INTERVAL HISTORY: [name] 75 y.o. female returns to the clinic today for follow-up visit accompanied by her daughter.The patient is feeling well today with no specific complaints except for mild fatigue. The patient denied having any significant chest pain, cough or hemoptysis. She is currently on home oxygen for the history of COPD and shortness of breath. She is tolerating her treatment with oral Tarceva fairly well with no significant adverse effects except for mild diarrhea improved with Imodium and grade 1 skin rash. She had repeat CBC and comprehensive metabolic panel. She had plasma test for EGFR mutation that showed no positive mutation.  MEDICAL HISTORY: Past Medical History  Diagnosis Date  . COPD (chronic obstructive pulmonary disease) ([place])   . Thyroid disease   . Pernicious anemia   . Hypertension   . AAA (abdominal aortic aneurysm) ([place])   . Chronic kidney disease   . Stroke ([place])   . Anemia of chronic disease 11/14/2014  . Lung cancer ([place])   . Primary cancer of right upper lobe of lung ([place]) 05/10/2012    ALLERGIES:  is allergic to codeine; penicillins; and pravastatin sodium.  MEDICATIONS:  Current Outpatient Prescriptions  Medication Sig Dispense Refill  . acetaminophen (TYLENOL) 500 MG tablet Take 500-1,000 mg by mouth  every 6 (six) hours as needed for moderate pain.     Marland Kitchen albuterol (PROVENTIL) (2.5 MG/3ML) 0.083% nebulizer solution Take 2.5 mg by nebulization every 4 (four) hours. Or Four times daily    . ALPRAZolam (XANAX) 1 MG tablet Take 1 mg by mouth 4 (four) times daily as needed. For anxiety and/or sleep    . amLODipine (NORVASC) 2.5 MG tablet Take 2.5 mg by mouth daily.  11  . aspirin EC 81 MG EC tablet Take 1 tablet (81 mg total) by mouth daily.    . clindamycin (CLEOCIN T) 1 % external solution Apply topically 2 (two) times daily. (Patient taking differently: Apply 1 application topically as needed. ) 30 mL 1  . Cyanocobalamin (VITAMIN B-12) 1000 MCG SUBL Place 1 tablet under the tongue daily.    . ferrous sulfate 325 (65 FE) MG tablet Take 325 mg by mouth 3 (three) times daily with meals.    . Fluticasone-Salmeterol (ADVAIR) 250-50 MCG/DOSE AEPB Inhale 1 puff into the lungs every 12 (twelve) hours.    Marland Kitchen guaiFENesin (MUCINEX) 600 MG 12 hr tablet Take 600 mg by mouth daily as needed for cough.     . levothyroxine (SYNTHROID, LEVOTHROID) 100 MCG tablet Take 100 mcg by mouth daily before breakfast.     . loperamide (IMODIUM) 2 MG capsule Take 2 mg by mouth as needed for diarrhea or loose stools.    Marland Kitchen loratadine (CLARITIN) 10 MG tablet Take 10 mg by mouth daily as needed for allergies.     . mirtazapine (REMERON) 7.5 MG tablet Take 7.5  mg by mouth at bedtime.  12  . NON FORMULARY OXYGEN      24/7    . potassium chloride SA (K-DUR,KLOR-CON) 20 MEQ tablet Take 1 tablet (20 mEq total) by mouth daily. 7 tablet 0  . TARCEVA 150 MG tablet TAKE 1 TABLET BY MOUTH DAILY. TAKE ON AN EMPTY STOMACH 1 HOUR BEFORE MEALS OR 2 HOURS AFTER. 30 tablet 2  . traMADol (ULTRAM) 50 MG tablet Take 1 tablet (50 mg total) by mouth every 12 (twelve) hours as needed. 50 tablet 1  . Vitamin D, Ergocalciferol, (DRISDOL) 50000 UNITS CAPS capsule Take 50,000 Units by mouth every 30 (thirty) days. Fridays     No current  facility-administered medications for this visit.    SURGICAL HISTORY:  Past Surgical History  Procedure Laterality Date  . Abdominal hysterectomy    . Appendectomy    . Cholecystectomy    . Tubal ligation    . Chest tube insertion  03/08/2012    Procedure: CHEST TUBE INSERTION;  Surgeon: Ivin Poot, MD;  Location: [facility];  Service: Thoracic;  Laterality: Right;  . Video assisted thoracoscopy  03/17/2012    Procedure: VIDEO ASSISTED THORACOSCOPY;  Surgeon: Ivin Poot, MD;  Location: [facility];  Service: Thoracic;  Laterality: Right;  . Resection of apical bleb  03/17/2012    Procedure: RESECTION OF APICAL BLEB;  Surgeon: Ivin Poot, MD;  Location: [facility];  Service: Thoracic;  Laterality: Right;  stapling of bleb  . Colonoscopy N/A 04/29/2014    Procedure: COLONOSCOPY;  Surgeon: Daneil Dolin, MD;  [facility];  Service: Endoscopy;  Laterality: N/A;  1245pm  . Esophagogastroduodenoscopy N/A 04/29/2014    Procedure: ESOPHAGOGASTRODUODENOSCOPY (EGD);  Surgeon: Daneil Dolin, MD;  [facility];  Service: Endoscopy;  Laterality: N/A;  . Savory dilation N/A 04/29/2014    Procedure: SAVORY DILATION;  Surgeon: Daneil Dolin, MD;  [facility];  Service: Endoscopy;  Laterality: N/A;  Venia Minks dilation N/A 04/29/2014    Procedure: Venia Minks DILATION;  Surgeon: Daneil Dolin, MD;  [facility];  Service: Endoscopy;  Laterality: N/A;  . Tee without cardioversion N/A 11/15/2014    Procedure: TRANSESOPHAGEAL ECHOCARDIOGRAM (TEE);  Surgeon: Sueanne Margarita, MD;  Location[facility];  Service: Cardiovascular;  Laterality: N/A;    REVIEW OF SYSTEMS:  A comprehensive review of systems was negative except for: Constitutional: positive for fatigue Respiratory: positive for dyspnea on exertion Integument/breast: positive for rash   PHYSICAL EXAMINATION: General appearance: alert, cooperative, fatigued and no distress Head: Normocephalic, without  obvious abnormality, atraumatic Neck: no adenopathy, no JVD, supple, symmetrical, trachea midline and thyroid not enlarged, symmetric, no tenderness/mass/nodules Lymph nodes: Cervical, supraclavicular, and axillary nodes normal. Resp: clear to auscultation bilaterally Back: symmetric, no curvature. ROM normal. No CVA tenderness. Cardio: regular rate and rhythm, S1, S2 normal, no murmur, click, rub or gallop GI: soft, non-tender; bowel sounds normal; no masses,  no organomegaly Extremities: edema 1+ Neurologic: Alert and oriented X 3, normal strength and tone. Normal symmetric reflexes. Normal coordination and gait  ECOG PERFORMANCE STATUS: 2 - Symptomatic, <50% confined to bed  Blood pressure 176/70, pulse 103, temperature 97.9 F (36.6 C), temperature source Oral, resp. rate 17, weight 99 lb (44.906 kg), SpO2 95 %.  LABORATORY DATA: Lab Results  Component Value Date   WBC 3.1* 09/11/2015   HGB 9.6* 09/11/2015   HCT 29.8* 09/11/2015   MCV 88.7 09/11/2015  PLT 234 09/11/2015      Chemistry      Component Value Date/Time   NA 139 08/08/2015 1057   NA 140 11/13/2014 1744   K 4.4 08/08/2015 1057   K 3.1* 11/13/2014 1744   CL 103 11/13/2014 1744   CO2 25 08/08/2015 1057   CO2 26 11/13/2014 1736   BUN 17.1 08/08/2015 1057   BUN 16 11/13/2014 1744   CREATININE 0.9 08/08/2015 1057   CREATININE 1.00 11/13/2014 1744   CREATININE 1.13* 03/14/2013 1210      Component Value Date/Time   CALCIUM 8.8 08/08/2015 1057   CALCIUM 8.7* 11/13/2014 1736   ALKPHOS 145 08/08/2015 1057   ALKPHOS 118 11/13/2014 1736   AST 15 08/08/2015 1057   AST 17 11/13/2014 1736   ALT 9 08/08/2015 1057   ALT 11* 11/13/2014 1736   BILITOT 0.87 08/08/2015 1057   BILITOT 0.7 11/13/2014 1736       RADIOGRAPHIC STUDIES: No results found.  ASSESSMENT AND PLAN: This is a very pleasant 75 years old with metastatic non-small cell lung cancer, adenocarcinoma Of the right upper lobe with positive EGFR  mutation who is currently undergoing treatment with Tarceva 150 mg by mouth daily status post 12 months of treatment. The patient is tolerating her treatment fairly well with no significant adverse effects except for diarrhea improved with Imodium. The recent plasma test for EGFR mutation was negative. I recommended for the patient to continue her treatment with Tarceva with the same dose for now. For the back pain, she will continue on pain medication for now. I will see the patient back for follow-up visit in one month for reevaluation with repeat blood work.  She was advised to call immediately if she has any concerning symptoms in the interval. The patient voices understanding of current disease status and treatment options and is in agreement with the current care plan.  All questions were answered. The patient knows to call the clinic with any problems, questions or concerns. We can certainly see the patient much sooner if necessary.  Disclaimer: This note was dictated with voice recognition software. Similar sounding words can inadvertently be transcribed and may not be corrected upon review.

## 2015-09-11 NOTE — Telephone Encounter (Signed)
Gave pt apt & avs °

## 2015-09-15 ENCOUNTER — Inpatient Hospital Stay: Admission: RE | Admit: 2015-09-15 | Payer: PPO | Source: Ambulatory Visit

## 2015-09-15 ENCOUNTER — Other Ambulatory Visit: Payer: PPO

## 2015-09-15 MED FILL — TARCEVA 150 MG TABLET: 150 | 30 days supply | Qty: 30 | Fill #1

## 2015-09-18 DIAGNOSIS — I10 Essential (primary) hypertension: Secondary | ICD-10-CM | POA: Diagnosis not present

## 2015-09-18 DIAGNOSIS — R609 Edema, unspecified: Secondary | ICD-10-CM | POA: Diagnosis not present

## 2015-09-18 DIAGNOSIS — D649 Anemia, unspecified: Secondary | ICD-10-CM | POA: Diagnosis not present

## 2015-09-18 DIAGNOSIS — R809 Proteinuria, unspecified: Secondary | ICD-10-CM | POA: Diagnosis not present

## 2015-09-18 DIAGNOSIS — E876 Hypokalemia: Secondary | ICD-10-CM | POA: Diagnosis not present

## 2015-09-22 ENCOUNTER — Ambulatory Visit: Payer: PPO | Admitting: Surgery

## 2015-09-23 DIAGNOSIS — J449 Chronic obstructive pulmonary disease, unspecified: Secondary | ICD-10-CM | POA: Diagnosis not present

## 2015-09-23 DIAGNOSIS — J439 Emphysema, unspecified: Secondary | ICD-10-CM | POA: Diagnosis not present

## 2015-09-24 DIAGNOSIS — C3490 Malignant neoplasm of unspecified part of unspecified bronchus or lung: Secondary | ICD-10-CM | POA: Diagnosis not present

## 2015-09-24 DIAGNOSIS — J9610 Chronic respiratory failure, unspecified whether with hypoxia or hypercapnia: Secondary | ICD-10-CM | POA: Diagnosis not present

## 2015-09-24 DIAGNOSIS — I10 Essential (primary) hypertension: Secondary | ICD-10-CM | POA: Diagnosis not present

## 2015-09-24 DIAGNOSIS — J449 Chronic obstructive pulmonary disease, unspecified: Secondary | ICD-10-CM | POA: Diagnosis not present

## 2015-10-08 ENCOUNTER — Other Ambulatory Visit: Payer: Self-pay | Admitting: *Deleted

## 2015-10-08 DIAGNOSIS — C3411 Malignant neoplasm of upper lobe, right bronchus or lung: Secondary | ICD-10-CM

## 2015-10-09 ENCOUNTER — Telehealth: Payer: Self-pay | Admitting: Internal Medicine

## 2015-10-09 ENCOUNTER — Ambulatory Visit (HOSPITAL_BASED_OUTPATIENT_CLINIC_OR_DEPARTMENT_OTHER): Payer: PPO | Admitting: Internal Medicine

## 2015-10-09 ENCOUNTER — Other Ambulatory Visit (HOSPITAL_BASED_OUTPATIENT_CLINIC_OR_DEPARTMENT_OTHER): Payer: PPO

## 2015-10-09 ENCOUNTER — Encounter: Payer: Self-pay | Admitting: Internal Medicine

## 2015-10-09 VITALS — BP 173/53 | HR 81 | Temp 98.2°F | Resp 18 | Ht 64.0 in | Wt 98.9 lb

## 2015-10-09 DIAGNOSIS — M549 Dorsalgia, unspecified: Secondary | ICD-10-CM

## 2015-10-09 DIAGNOSIS — R21 Rash and other nonspecific skin eruption: Secondary | ICD-10-CM | POA: Diagnosis not present

## 2015-10-09 DIAGNOSIS — C3411 Malignant neoplasm of upper lobe, right bronchus or lung: Secondary | ICD-10-CM

## 2015-10-09 DIAGNOSIS — Z5111 Encounter for antineoplastic chemotherapy: Secondary | ICD-10-CM

## 2015-10-09 DIAGNOSIS — J449 Chronic obstructive pulmonary disease, unspecified: Secondary | ICD-10-CM | POA: Diagnosis not present

## 2015-10-09 DIAGNOSIS — J9610 Chronic respiratory failure, unspecified whether with hypoxia or hypercapnia: Secondary | ICD-10-CM | POA: Diagnosis not present

## 2015-10-09 LAB — CBC WITH DIFFERENTIAL/PLATELET
BASO%: 0.6 % (ref 0.0–2.0)
Basophils Absolute: 0 10*3/uL (ref 0.0–0.1)
EOS%: 5 % (ref 0.0–7.0)
Eosinophils Absolute: 0.2 10*3/uL (ref 0.0–0.5)
HCT: 28.5 % — ABNORMAL LOW (ref 34.8–46.6)
HEMOGLOBIN: 9.1 g/dL — AB (ref 11.6–15.9)
LYMPH%: 22.6 % (ref 14.0–49.7)
MCH: 27.9 pg (ref 25.1–34.0)
MCHC: 31.9 g/dL (ref 31.5–36.0)
MCV: 87.4 fL (ref 79.5–101.0)
MONO#: 0.4 10*3/uL (ref 0.1–0.9)
MONO%: 12 % (ref 0.0–14.0)
NEUT#: 2 10*3/uL (ref 1.5–6.5)
NEUT%: 59.8 % (ref 38.4–76.8)
Platelets: 224 10*3/uL (ref 145–400)
RBC: 3.26 10*6/uL — AB (ref 3.70–5.45)
RDW: 14.2 % (ref 11.2–14.5)
WBC: 3.4 10*3/uL — AB (ref 3.9–10.3)
lymph#: 0.8 10*3/uL — ABNORMAL LOW (ref 0.9–3.3)

## 2015-10-09 LAB — COMPREHENSIVE METABOLIC PANEL
ALBUMIN: 3.1 g/dL — AB (ref 3.5–5.0)
ALK PHOS: 159 U/L — AB (ref 40–150)
ALT: 9 U/L (ref 0–55)
AST: 14 U/L (ref 5–34)
Anion Gap: 8 mEq/L (ref 3–11)
BUN: 18 mg/dL (ref 7.0–26.0)
CALCIUM: 8.5 mg/dL (ref 8.4–10.4)
CO2: 26 mEq/L (ref 22–29)
Chloride: 108 mEq/L (ref 98–109)
Creatinine: 0.8 mg/dL (ref 0.6–1.1)
EGFR: 70 mL/min/{1.73_m2} — AB (ref >90)
Glucose: 90 mg/dl (ref 70–140)
POTASSIUM: 4.1 meq/L (ref 3.5–5.1)
SODIUM: 141 meq/L (ref 136–145)
Total Bilirubin: 0.51 mg/dL (ref 0.20–1.20)
Total Protein: 5.9 g/dL — ABNORMAL LOW (ref 6.4–8.3)

## 2015-10-09 NOTE — Telephone Encounter (Signed)
per pof to sch pt appt-gave pt copy of avs-gave contrast-adv central sch willc all to sch trmt

## 2015-10-09 NOTE — Progress Notes (Signed)
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 The patient has multifocal disease including 2 nodules in the right lung with a suspicious tiny lesion in the left lung.  PRIOR THERAPY: Palliative radiotherapy to the enlarging right upper lobe lung mass under the care of Dr. Pablo Ledger completed on 01/06/2015.  CURRENT THERAPY:  1) Tarceva 150 mg by mouth daily status post 15 months of treatment.  INTERVAL HISTORY: [name] 75 y.o. female returns to the clinic today for follow-up visit accompanied by her daughter.The patient is feeling well today with no specific complaints except for mild fatigue and the baseline shortness of breath. The patient denied having any significant chest pain, cough or hemoptysis. She is currently on home oxygen. She is tolerating her treatment with oral Tarceva fairly well with no significant adverse effects except for grade 1 skin rash. She had repeat CBC and comprehensive metabolic panel.   MEDICAL HISTORY: Past Medical History  Diagnosis Date  . COPD (chronic obstructive pulmonary disease) ([place])   . Thyroid disease   . Pernicious anemia   . Hypertension   . AAA (abdominal aortic aneurysm) ([place])   . Chronic kidney disease   . Stroke ([place])   . Anemia of chronic disease 11/14/2014  . Lung cancer ([place])   . Primary cancer of right upper lobe of lung ([place]) 05/10/2012    ALLERGIES:  is allergic to codeine; penicillins; and pravastatin sodium.  MEDICATIONS:  Current Outpatient Prescriptions  Medication Sig Dispense Refill  . acetaminophen (TYLENOL) 500 MG tablet Take 500-1,000 mg by mouth every 6 (six) hours as needed for moderate pain.     Marland Kitchen albuterol (PROVENTIL) (2.5 MG/3ML) 0.083% nebulizer solution Take  2.5 mg by nebulization every 4 (four) hours. Or Four times daily    . ALPRAZolam (XANAX) 1 MG tablet Take 1 mg by mouth 4 (four) times daily as needed. For anxiety and/or sleep    . amLODipine (NORVASC) 2.5 MG tablet Take 2.5 mg by mouth daily.  11  . aspirin EC 81 MG EC tablet Take 1 tablet (81 mg total) by mouth daily.    . clindamycin (CLEOCIN T) 1 % external solution Apply topically 2 (two) times daily. (Patient taking differently: Apply 1 application topically as needed. ) 30 mL 1  . Cyanocobalamin (VITAMIN B-12) 1000 MCG SUBL Place 1 tablet under the tongue daily.    . ferrous sulfate 325 (65 FE) MG tablet Take 325 mg by mouth 3 (three) times daily with meals.    . Fluticasone-Salmeterol (ADVAIR) 250-50 MCG/DOSE AEPB Inhale 1 puff into the lungs every 12 (twelve) hours.    Marland Kitchen guaiFENesin (MUCINEX) 600 MG 12 hr tablet Take 600 mg by mouth daily as needed for cough.     . levothyroxine (SYNTHROID, LEVOTHROID) 100 MCG tablet Take 100 mcg by mouth daily before breakfast.     . loperamide (IMODIUM) 2 MG capsule Take 2 mg by mouth as needed for diarrhea or loose stools.    Marland Kitchen loratadine (CLARITIN) 10 MG tablet Take 10 mg by mouth daily as needed for allergies.     . mirtazapine (REMERON) 7.5 MG tablet Take 7.5 mg by mouth at bedtime.  12  . NON FORMULARY OXYGEN      24/7    .  potassium chloride SA (K-DUR,KLOR-CON) 20 MEQ tablet Take 1 tablet (20 mEq total) by mouth daily. 7 tablet 0  . TARCEVA 150 MG tablet TAKE 1 TABLET BY MOUTH DAILY. TAKE ON AN EMPTY STOMACH 1 HOUR BEFORE MEALS OR 2 HOURS AFTER. 30 tablet 2  . traMADol (ULTRAM) 50 MG tablet Take 1 tablet (50 mg total) by mouth every 12 (twelve) hours as needed. (Patient not taking: Reported on 10/09/2015) 50 tablet 1  . Vitamin D, Ergocalciferol, (DRISDOL) 50000 UNITS CAPS capsule Take 50,000 Units by mouth every 30 (thirty) days. Reported on 10/09/2015     No current facility-administered medications for this visit.    SURGICAL HISTORY:  Past  Surgical History  Procedure Laterality Date  . Abdominal hysterectomy    . Appendectomy    . Cholecystectomy    . Tubal ligation    . Chest tube insertion  03/08/2012    Procedure: CHEST TUBE INSERTION;  Surgeon: Ivin Poot, MD;  Location: [place];  Service: Thoracic;  Laterality: Right;  . Video assisted thoracoscopy  03/17/2012    Procedure: VIDEO ASSISTED THORACOSCOPY;  Surgeon: Ivin Poot, MD;  Location: [facility];  Service: Thoracic;  Laterality: Right;  . Resection of apical bleb  03/17/2012    Procedure: RESECTION OF APICAL BLEB;  Surgeon: Ivin Poot, MD;  Location: [facility];  Service: Thoracic;  Laterality: Right;  stapling of bleb  . Colonoscopy N/A 04/29/2014    Procedure: COLONOSCOPY;  Surgeon: Daneil Dolin, MD;  [facility];  Service: Endoscopy;  Laterality: N/A;  1245pm  . Esophagogastroduodenoscopy N/A 04/29/2014    Procedure: ESOPHAGOGASTRODUODENOSCOPY (EGD);  Surgeon: Daneil Dolin, MD;  [facility];  Service: Endoscopy;  Laterality: N/A;  . Savory dilation N/A 04/29/2014    Procedure: SAVORY DILATION;  Surgeon: Daneil Dolin, MD;  [facility];  Service: Endoscopy;  Laterality: N/A;  Venia Minks dilation N/A 04/29/2014    Procedure: Venia Minks DILATION;  Surgeon: Daneil Dolin, MD;  [facility];  Service: Endoscopy;  Laterality: N/A;  . Tee without cardioversion N/A 11/15/2014    Procedure: TRANSESOPHAGEAL ECHOCARDIOGRAM (TEE);  Surgeon: Sueanne Margarita, MD;  Location: [facility] END[facility];  Service: Cardiovascular;  Laterality: N/A;    REVIEW OF SYSTEMS:  A comprehensive review of systems was negative except for: Constitutional: positive for fatigue Respiratory: positive for dyspnea on exertion Integument/breast: positive for rash   PHYSICAL EXAMINATION: General appearance: alert, cooperative, fatigued and no distress Head: Normocephalic, without obvious abnormality, atraumatic Neck: no adenopathy, no JVD, supple, symmetrical,  trachea midline and thyroid not enlarged, symmetric, no tenderness/mass/nodules Lymph nodes: Cervical, supraclavicular, and axillary nodes normal. Resp: clear to auscultation bilaterally Back: symmetric, no curvature. ROM normal. No CVA tenderness. Cardio: regular rate and rhythm, S1, S2 normal, no murmur, click, rub or gallop GI: soft, non-tender; bowel sounds normal; no masses,  no organomegaly Extremities: edema 1+ Neurologic: Alert and oriented X 3, normal strength and tone. Normal symmetric reflexes. Normal coordination and gait  ECOG PERFORMANCE STATUS: 2 - Symptomatic, <50% confined to bed  Blood pressure 173/53, pulse 81, temperature 98.2 F (36.8 C), temperature source Oral, resp. rate 18, height 5' 4"  (1.626 m), weight 98 lb 14.4 oz (44.861 kg), SpO2 99 %.  LABORATORY DATA: Lab Results  Component Value Date   WBC 3.4* 10/09/2015   HGB 9.1* 10/09/2015   HCT 28.5* 10/09/2015   MCV 87.4 10/09/2015   PLT 224 10/09/2015  Chemistry      Component Value Date/Time   NA 140 09/11/2015 1053   NA 140 11/13/2014 1744   K 3.8 09/11/2015 1053   K 3.1* 11/13/2014 1744   CL 103 11/13/2014 1744   CO2 24 09/11/2015 1053   CO2 26 11/13/2014 1736   BUN 12.1 09/11/2015 1053   BUN 16 11/13/2014 1744   CREATININE 0.9 09/11/2015 1053   CREATININE 1.00 11/13/2014 1744   CREATININE 1.13* 03/14/2013 1210      Component Value Date/Time   CALCIUM 8.8 09/11/2015 1053   CALCIUM 8.7* 11/13/2014 1736   ALKPHOS 142 09/11/2015 1053   ALKPHOS 118 11/13/2014 1736   AST 15 09/11/2015 1053   AST 17 11/13/2014 1736   ALT <9 09/11/2015 1053   ALT 11* 11/13/2014 1736   BILITOT 0.68 09/11/2015 1053   BILITOT 0.7 11/13/2014 1736       RADIOGRAPHIC STUDIES: No results found.  ASSESSMENT AND PLAN: This is a very pleasant 75 years old with metastatic non-small cell lung cancer, adenocarcinoma Of the right upper lobe with positive EGFR mutation who is currently undergoing treatment with  Tarceva 150 mg by mouth daily status post 15 months of treatment. The patient is tolerating her treatment fairly well with no significant adverse effects except for mild skin rash. I recommended for the patient to continue her treatment with Tarceva with the same dose for now. For the back pain, she will continue on pain medication for now. I will see the patient back for follow-up visit in one month for reevaluation with repeat blood work as well as repeat CT scan of the chest, abdomen and pelvis for restaging of her disease.  She was advised to call immediately if she has any concerning symptoms in the interval. The patient voices understanding of current disease status and treatment options and is in agreement with the current care plan.  All questions were answered. The patient knows to call the clinic with any problems, questions or concerns. We can certainly see the patient much sooner if necessary.  Disclaimer: This note was dictated with voice recognition software. Similar sounding words can inadvertently be transcribed and may not be corrected upon review.

## 2015-10-17 MED FILL — TARCEVA 150 MG TABLET: 150 | 30 days supply | Qty: 30 | Fill #2

## 2015-10-21 ENCOUNTER — Other Ambulatory Visit: Payer: Self-pay | Admitting: *Deleted

## 2015-10-21 DIAGNOSIS — I714 Abdominal aortic aneurysm, without rupture, unspecified: Secondary | ICD-10-CM

## 2015-10-23 ENCOUNTER — Encounter: Payer: Self-pay | Admitting: Surgery

## 2015-10-23 DIAGNOSIS — J439 Emphysema, unspecified: Secondary | ICD-10-CM | POA: Diagnosis not present

## 2015-10-23 DIAGNOSIS — J449 Chronic obstructive pulmonary disease, unspecified: Secondary | ICD-10-CM | POA: Diagnosis not present

## 2015-10-24 ENCOUNTER — Telehealth: Payer: Self-pay | Admitting: Internal Medicine

## 2015-10-24 NOTE — Telephone Encounter (Signed)
per pof to sch pt appt*cld and adv pt of appt time change and date

## 2015-10-27 ENCOUNTER — Ambulatory Visit (HOSPITAL_COMMUNITY)
Admission: RE | Admit: 2015-10-27 | Discharge: 2015-10-27 | Disposition: A | Discharge status: Home / Self Care | Payer: PPO | Source: Ambulatory Visit | Attending: Surgery | Admitting: Surgery

## 2015-10-27 DIAGNOSIS — I129 Hypertensive chronic kidney disease with stage 1 through stage 4 chronic kidney disease, or unspecified chronic kidney disease: Secondary | ICD-10-CM | POA: Insufficient documentation

## 2015-10-27 DIAGNOSIS — E079 Disorder of thyroid, unspecified: Secondary | ICD-10-CM | POA: Insufficient documentation

## 2015-10-27 DIAGNOSIS — J449 Chronic obstructive pulmonary disease, unspecified: Secondary | ICD-10-CM | POA: Diagnosis not present

## 2015-10-27 DIAGNOSIS — N189 Chronic kidney disease, unspecified: Secondary | ICD-10-CM | POA: Insufficient documentation

## 2015-10-27 DIAGNOSIS — I714 Abdominal aortic aneurysm, without rupture, unspecified: Secondary | ICD-10-CM

## 2015-11-03 ENCOUNTER — Ambulatory Visit (INDEPENDENT_AMBULATORY_CARE_PROVIDER_SITE_OTHER): Payer: PPO | Admitting: Surgery

## 2015-11-03 ENCOUNTER — Encounter: Payer: Self-pay | Admitting: Surgery

## 2015-11-03 ENCOUNTER — Ambulatory Visit: Payer: PPO | Admitting: Surgery

## 2015-11-03 VITALS — BP 188/77 | HR 83 | Temp 97.6°F | Resp 16 | Ht 64.0 in | Wt 102.0 lb

## 2015-11-03 DIAGNOSIS — I714 Abdominal aortic aneurysm, without rupture, unspecified: Secondary | ICD-10-CM

## 2015-11-03 NOTE — Progress Notes (Signed)
[facility] [place]  Patient name: [name] MRN: [id number] DOB: [birth year] Sex: female  REASON FOR VISIT: Follow up AAA  HPI: [name] is a 75 y.o. female who is back today for follow-up of her abdominal aortic aneurysm.  This was detected on CT scan during treatment for recurrent non-small cell lung cancer.  She has a history of palliative radiation therapy to a right upper lobe mass.  Her aneurysm has been stable at 4.8 cm.  She also has an occluded left iliac artery.  Past Medical History:  Diagnosis Date  . AAA (abdominal aortic aneurysm) ([place])   . Anemia of chronic disease 11/14/2014  . Chronic kidney disease   . COPD (chronic obstructive pulmonary disease) ([place])   . Hypertension   . Lung cancer ([place])   . Pernicious anemia   . Primary cancer of right upper lobe of lung ([place]) 05/10/2012  . Stroke ([place])   . Thyroid disease     Family History  Problem Relation Age of Onset  . Cancer Mother   . Heart failure Brother   . Heart disease Brother     before age 61  . Colon cancer Neg Hx     SOCIAL HISTORY: Social History  Substance Use Topics  . Smoking status: Former Smoker    Types: Cigarettes    Quit date: 04/05/2006  . Smokeless tobacco: Former Systems developer    Quit date: 03/07/2006  . Alcohol use No    Allergies  Allergen Reactions  . Codeine Nausea And Vomiting    Projectile vomiting  . Penicillins Rash    Tolerates Rocephine  . Pravastatin Sodium Other (See Comments)    Muscle cramps    Current Outpatient Prescriptions  Medication Sig Dispense Refill  . acetaminophen (TYLENOL) 500 MG tablet Take 500-1,000 mg by mouth every 6 (six) hours as needed for moderate pain.     Marland Kitchen albuterol (PROVENTIL) (2.5 MG/3ML) 0.083% nebulizer solution Take 2.5 mg by nebulization every 4 (four) hours. Or Four times daily    . ALPRAZolam (XANAX) 1 MG tablet Take 1 mg by mouth 4 (four) times daily as needed. For anxiety and/or sleep     . amLODipine (NORVASC) 2.5 MG tablet Take 2.5 mg by mouth daily.  11  . aspirin EC 81 MG EC tablet Take 1 tablet (81 mg total) by mouth daily.    . clindamycin (CLEOCIN T) 1 % external solution Apply topically 2 (two) times daily. (Patient taking differently: Apply 1 application topically as needed. ) 30 mL 1  . Cyanocobalamin (VITAMIN B-12) 1000 MCG SUBL Place 1 tablet under the tongue daily.    . ferrous sulfate 325 (65 FE) MG tablet Take 325 mg by mouth 3 (three) times daily with meals.    . Fluticasone-Salmeterol (ADVAIR) 250-50 MCG/DOSE AEPB Inhale 1 puff into the lungs every 12 (twelve) hours.    Marland Kitchen guaiFENesin (MUCINEX) 600 MG 12 hr tablet Take 600 mg by mouth daily as needed for cough.     . levothyroxine (SYNTHROID, LEVOTHROID) 100 MCG tablet Take 100 mcg by mouth daily before breakfast.     . loperamide (IMODIUM) 2 MG capsule Take 2 mg by mouth as needed for diarrhea or loose stools.    Marland Kitchen loratadine (CLARITIN) 10 MG tablet Take 10 mg by mouth daily as needed for allergies.     . mirtazapine (REMERON) 7.5 MG tablet Take 7.5 mg by mouth at bedtime.  12  . NON FORMULARY  OXYGEN      24/7    . potassium chloride SA (K-DUR,KLOR-CON) 20 MEQ tablet Take 1 tablet (20 mEq total) by mouth daily. 7 tablet 0  . TARCEVA 150 MG tablet TAKE 1 TABLET BY MOUTH DAILY. TAKE ON AN EMPTY STOMACH 1 HOUR BEFORE MEALS OR 2 HOURS AFTER. 30 tablet 2  . Vitamin D, Ergocalciferol, (DRISDOL) 50000 UNITS CAPS capsule Take 50,000 Units by mouth every 30 (thirty) days. Reported on 10/09/2015    . traMADol (ULTRAM) 50 MG tablet Take 1 tablet (50 mg total) by mouth every 12 (twelve) hours as needed. (Patient not taking: Reported on 11/03/2015) 50 tablet 1   No current facility-administered medications for this visit.     REVIEW OF SYSTEMS:  '[X]'$  denotes positive finding, '[ ]'$  denotes negative finding Cardiac  Comments:  Chest pain or chest pressure:    Shortness of breath upon exertion: x   Short of breath when lying  flat: x Home oxygen   Irregular heart rhythm:        Vascular    Pain in calf, thigh, or hip brought on by ambulation:    Pain in feet at night that wakes you up from your sleep:     Blood clot in your veins:    Leg swelling:  x       Pulmonary    Oxygen at home: x   Productive cough:  x   Wheezing:         Neurologic    Sudden weakness in arms or legs:  x   Sudden numbness in arms or legs:     Sudden onset of difficulty speaking or slurred speech:    Temporary loss of vision in one eye:     Problems with dizziness:         Gastrointestinal    Blood in stool:     Vomited blood:         Genitourinary    Burning when urinating:     Blood in urine:        Psychiatric    Major depression:         Hematologic    Bleeding problems:    Problems with blood clotting too easily:        Skin    Rashes or ulcers: x       Constitutional    Fever or chills: x     PHYSICAL EXAM: Vitals:   11/03/15 1043 11/03/15 1045  BP: (!) 191/79 (!) 188/77  Pulse: 83   Resp: 16   Temp: 97.6 F (36.4 C)   TempSrc: Oral   SpO2: 96%   Weight: 102 lb (46.3 kg)   Height: '5\' 4"'$  (1.626 m)     GENERAL: The patient is a well-nourished female, in no acute distress. The vital signs are documented above. CARDIAC: There is a regular rate and rhythm.  VASCULAR: No carotid bruits PULMONARY: There is good air exchange bilaterally without wheezing or rales. ABDOMEN: Soft and non-tender with normal pitched bowel sounds.  MUSCULOSKELETAL: There are no major deformities or cyanosis. NEUROLOGIC: No focal weakness or paresthesias are detected. SKIN: There are no ulcers or rashes noted. PSYCHIATRIC: The patient has a normal affect.  DATA:  I have reviewed her CT scan with the following findings: 1. Findings concerning for recurrent tumor in the right lung along with pulmonary metastatic disease. 2. No findings for abdominal/metastatic disease. 3. Stable T3 lesion.  No new bone lesions. 4. Stable  changes of chronic calcific pancreatitis. 5. Stable 4.8 cm infrarenal abdominal aortic aneurysm. 6. Status post cholecystectomy with mild biliary dilatation.  MEDICAL ISSUES: Abdominal aortic aneurysm: Maximum aortic diameter remained stable at 4.8 cm.  I again discussed with the patient that she is a very high operative risk, given that she has an occluded left iliac artery and would need a femoral-femoral bypass graft in addition to her stent graft.  This would most likely require general anesthesia which I'm not sure she would tolerate with her underlying lung pathology.  In addition, it appears that the lung cancer may have recurred.  I discussed that I would continue to follow her aneurysm.  I have her scheduled for follow-up in 6 months.  We again discussed the signs and symptoms of rupture and to report to the emergency department should these occur.    Annamarie Major, MD [facility] [phone] Pager [phone]

## 2015-11-05 DIAGNOSIS — I10 Essential (primary) hypertension: Secondary | ICD-10-CM | POA: Diagnosis not present

## 2015-11-05 DIAGNOSIS — C3490 Malignant neoplasm of unspecified part of unspecified bronchus or lung: Secondary | ICD-10-CM | POA: Diagnosis not present

## 2015-11-05 DIAGNOSIS — J449 Chronic obstructive pulmonary disease, unspecified: Secondary | ICD-10-CM | POA: Diagnosis not present

## 2015-11-05 DIAGNOSIS — J9610 Chronic respiratory failure, unspecified whether with hypoxia or hypercapnia: Secondary | ICD-10-CM | POA: Diagnosis not present

## 2015-11-07 ENCOUNTER — Ambulatory Visit (HOSPITAL_COMMUNITY)
Admission: RE | Admit: 2015-11-07 | Discharge: 2015-11-07 | Disposition: A | Discharge status: Home / Self Care | Payer: PPO | Source: Ambulatory Visit | Attending: Internal Medicine | Admitting: Internal Medicine

## 2015-11-07 ENCOUNTER — Other Ambulatory Visit: Payer: PPO

## 2015-11-07 ENCOUNTER — Encounter (HOSPITAL_COMMUNITY): Payer: Self-pay

## 2015-11-07 ENCOUNTER — Ambulatory Visit (HOSPITAL_BASED_OUTPATIENT_CLINIC_OR_DEPARTMENT_OTHER): Payer: PPO | Admitting: Internal Medicine

## 2015-11-07 DIAGNOSIS — Z5111 Encounter for antineoplastic chemotherapy: Secondary | ICD-10-CM

## 2015-11-07 DIAGNOSIS — C3491 Malignant neoplasm of unspecified part of right bronchus or lung: Secondary | ICD-10-CM | POA: Diagnosis not present

## 2015-11-07 DIAGNOSIS — R918 Other nonspecific abnormal finding of lung field: Secondary | ICD-10-CM | POA: Diagnosis not present

## 2015-11-07 DIAGNOSIS — K861 Other chronic pancreatitis: Secondary | ICD-10-CM | POA: Insufficient documentation

## 2015-11-07 DIAGNOSIS — C3411 Malignant neoplasm of upper lobe, right bronchus or lung: Secondary | ICD-10-CM

## 2015-11-07 DIAGNOSIS — I714 Abdominal aortic aneurysm, without rupture: Secondary | ICD-10-CM | POA: Insufficient documentation

## 2015-11-07 LAB — COMPREHENSIVE METABOLIC PANEL
ALT: <9 U/L (ref 0–55)
AST: 12 U/L (ref 5–34)
Albumin: 2.9 g/dL — ABNORMAL LOW (ref 3.5–5.0)
Alkaline Phosphatase: 153 U/L — ABNORMAL HIGH (ref 40–150)
Anion Gap: 12 mEq/L — ABNORMAL HIGH (ref 3–11)
BUN: 21 mg/dL (ref 7.0–26.0)
CALCIUM: 8.3 mg/dL — AB (ref 8.4–10.4)
CHLORIDE: 104 meq/L (ref 98–109)
CO2: 26 mEq/L (ref 22–29)
Creatinine: 0.8 mg/dL (ref 0.6–1.1)
EGFR: 70 mL/min/{1.73_m2} — ABNORMAL LOW (ref >90)
GLUCOSE: 98 mg/dL (ref 70–140)
POTASSIUM: 3.2 meq/L — AB (ref 3.5–5.1)
SODIUM: 142 meq/L (ref 136–145)
Total Bilirubin: 0.51 mg/dL (ref 0.20–1.20)
Total Protein: 6.2 g/dL — ABNORMAL LOW (ref 6.4–8.3)

## 2015-11-07 LAB — CBC WITH DIFFERENTIAL/PLATELET
BASO%: 0.4 % (ref 0.0–2.0)
BASOS ABS: 0 10*3/uL (ref 0.0–0.1)
EOS%: 7.3 % — AB (ref 0.0–7.0)
Eosinophils Absolute: 0.2 10*3/uL (ref 0.0–0.5)
HCT: 28.3 % — ABNORMAL LOW (ref 34.8–46.6)
HGB: 8.9 g/dL — ABNORMAL LOW (ref 11.6–15.9)
LYMPH%: 21.8 % (ref 14.0–49.7)
MCH: 27.7 pg (ref 25.1–34.0)
MCHC: 31.4 g/dL — ABNORMAL LOW (ref 31.5–36.0)
MCV: 88.2 fL (ref 79.5–101.0)
MONO#: 0.3 10*3/uL (ref 0.1–0.9)
MONO%: 11.9 % (ref 0.0–14.0)
NEUT#: 1.5 10*3/uL (ref 1.5–6.5)
NEUT%: 58.6 % (ref 38.4–76.8)
Platelets: 225 10*3/uL (ref 145–400)
RBC: 3.21 10*6/uL — AB (ref 3.70–5.45)
RDW: 14.2 % (ref 11.2–14.5)
WBC: 2.6 10*3/uL — ABNORMAL LOW (ref 3.9–10.3)
lymph#: 0.6 10*3/uL — ABNORMAL LOW (ref 0.9–3.3)

## 2015-11-07 MED ORDER — IOPAMIDOL (ISOVUE-300) INJECTION 61%
100.0000 mL | Freq: Once | INTRAVENOUS | Status: AC | PRN
  Administered 2015-11-07: 80 mL via INTRAVENOUS

## 2015-11-08 NOTE — Progress Notes (Signed)
Call patient with the result and encourage K rich diet

## 2015-11-09 DIAGNOSIS — J449 Chronic obstructive pulmonary disease, unspecified: Secondary | ICD-10-CM | POA: Diagnosis not present

## 2015-11-09 DIAGNOSIS — J9610 Chronic respiratory failure, unspecified whether with hypoxia or hypercapnia: Secondary | ICD-10-CM | POA: Diagnosis not present

## 2015-11-10 ENCOUNTER — Telehealth: Payer: Self-pay

## 2015-11-10 NOTE — Telephone Encounter (Signed)
Called and spoke to patients husband and informed him to have pt increase potassium in her diet, examples given. Pt husband verbalized understanding.

## 2015-11-10 NOTE — Telephone Encounter (Signed)
-----   Message from Ardeen Garland, RN sent at 11/10/2015  1:19 PM EDT ----- Can you call this pt too? ----- Message ----- From: Curt Bears, MD Sent: 11/08/2015  10:31 AM To: Lucile Crater, RN, Diane Lillia Abed, RN  Call patient with the result and encourage K rich diet

## 2015-11-13 ENCOUNTER — Encounter: Payer: Self-pay | Admitting: Internal Medicine

## 2015-11-13 ENCOUNTER — Telehealth: Payer: Self-pay | Admitting: Internal Medicine

## 2015-11-13 ENCOUNTER — Ambulatory Visit (HOSPITAL_BASED_OUTPATIENT_CLINIC_OR_DEPARTMENT_OTHER): Payer: PPO | Admitting: Internal Medicine

## 2015-11-13 ENCOUNTER — Ambulatory Visit: Payer: PPO | Admitting: Internal Medicine

## 2015-11-13 VITALS — BP 160/65 | HR 81 | Temp 98.6°F | Resp 18 | Ht 64.0 in | Wt 100.9 lb

## 2015-11-13 DIAGNOSIS — C3411 Malignant neoplasm of upper lobe, right bronchus or lung: Secondary | ICD-10-CM

## 2015-11-13 DIAGNOSIS — M549 Dorsalgia, unspecified: Secondary | ICD-10-CM

## 2015-11-13 DIAGNOSIS — Z5111 Encounter for antineoplastic chemotherapy: Secondary | ICD-10-CM

## 2015-11-13 DIAGNOSIS — E876 Hypokalemia: Secondary | ICD-10-CM

## 2015-11-13 NOTE — Telephone Encounter (Signed)
Gave patient avs report and appointments for September.  °

## 2015-11-13 NOTE — Progress Notes (Signed)
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 The patient has multifocal disease including 2 nodules in the right lung with a suspicious tiny lesion in the left lung.  PRIOR THERAPY: Palliative radiotherapy to the enlarging right upper lobe lung mass under the care of Dr. Pablo Ledger completed on 01/06/2015.  CURRENT THERAPY:  1) Tarceva 150 mg by mouth daily status post 16 months of treatment.  INTERVAL HISTORY: [name] 75 y.o. female returns to the clinic today for follow-up visit accompanied by her daughter.The patient is feeling well today with no specific complaints except for mild fatigue and the baseline shortness of breath. The patient denied having any significant chest pain, cough or hemoptysis. She is currently on home oxygen. She is tolerating her treatment with oral Tarceva fairly well with no significant adverse effects except for grade 1 skin rash and occasional episodes of diarrhea. She had repeat CT scan of the chest, abdomen and pelvis performed recently and she is here for evaluation and discussion of her scan results.  MEDICAL HISTORY: Past Medical History:  Diagnosis Date  . AAA (abdominal aortic aneurysm) ([place])   . Anemia of chronic disease 11/14/2014  . Chronic kidney disease   . COPD (chronic obstructive pulmonary disease) ([place])   . Hypertension   . Lung cancer ([place])   . Pernicious anemia   . Primary cancer of right upper lobe of lung ([place]) 05/10/2012  . Stroke ([place])   . Thyroid disease     ALLERGIES:  is allergic to codeine; penicillins; and pravastatin sodium.  MEDICATIONS:  Current Outpatient Prescriptions  Medication Sig Dispense Refill  . acetaminophen (TYLENOL) 500 MG tablet Take 500-1,000 mg by  mouth every 6 (six) hours as needed for moderate pain.     Marland Kitchen albuterol (PROVENTIL) (2.5 MG/3ML) 0.083% nebulizer solution Take 2.5 mg by nebulization every 4 (four) hours. Or Four times daily    . ALPRAZolam (XANAX) 1 MG tablet Take 1 mg by mouth 4 (four) times daily as needed. For anxiety and/or sleep    . amLODipine (NORVASC) 2.5 MG tablet Take 2.5 mg by mouth daily.  11  . aspirin EC 81 MG EC tablet Take 1 tablet (81 mg total) by mouth daily.    . clindamycin (CLEOCIN T) 1 % external solution Apply topically 2 (two) times daily. (Patient taking differently: Apply 1 application topically as needed. ) 30 mL 1  . Cyanocobalamin (VITAMIN B-12) 1000 MCG SUBL Place 1 tablet under the tongue daily.    . ferrous sulfate 325 (65 FE) MG tablet Take 325 mg by mouth 3 (three) times daily with meals.    . Fluticasone-Salmeterol (ADVAIR) 250-50 MCG/DOSE AEPB Inhale 1 puff into the lungs every 12 (twelve) hours.    Marland Kitchen guaiFENesin (MUCINEX) 600 MG 12 hr tablet Take 600 mg by mouth daily as needed for cough.     . levothyroxine (SYNTHROID, LEVOTHROID) 100 MCG tablet Take 100 mcg by mouth daily before breakfast.     . loperamide (IMODIUM) 2 MG capsule Take 2 mg by mouth as needed for diarrhea or loose stools.    Marland Kitchen loratadine (CLARITIN) 10 MG tablet Take 10 mg by mouth daily as needed for allergies.     . mirtazapine (REMERON) 7.5 MG tablet Take  7.5 mg by mouth at bedtime.  12  . NON FORMULARY OXYGEN      24/7    . potassium chloride SA (K-DUR,KLOR-CON) 20 MEQ tablet Take 1 tablet (20 mEq total) by mouth daily. 7 tablet 0  . TARCEVA 150 MG tablet TAKE 1 TABLET BY MOUTH DAILY. TAKE ON AN EMPTY STOMACH 1 HOUR BEFORE MEALS OR 2 HOURS AFTER. 30 tablet 2  . traMADol (ULTRAM) 50 MG tablet Take 1 tablet (50 mg total) by mouth every 12 (twelve) hours as needed. (Patient not taking: Reported on 11/03/2015) 50 tablet 1  . Vitamin D, Ergocalciferol, (DRISDOL) 50000 UNITS CAPS capsule Take 50,000 Units by mouth every 30  (thirty) days. Reported on 10/09/2015     No current facility-administered medications for this visit.     SURGICAL HISTORY:  Past Surgical History:  Procedure Laterality Date  . ABDOMINAL HYSTERECTOMY    . APPENDECTOMY    . CHEST TUBE INSERTION  03/08/2012   Procedure: CHEST TUBE INSERTION;  Surgeon: Ivin Poot, MD;  Location: [facility];  Service: Thoracic;  Laterality: Right;  . CHOLECYSTECTOMY    . COLONOSCOPY N/A 04/29/2014   Procedure: COLONOSCOPY;  Surgeon: Daneil Dolin, MD;  [facility];  Service: Endoscopy;  Laterality: N/A;  1245pm  . ESOPHAGOGASTRODUODENOSCOPY N/A 04/29/2014   Procedure: ESOPHAGOGASTRODUODENOSCOPY (EGD);  Surgeon: Daneil Dolin, MD;  [facility];  Service: Endoscopy;  Laterality: N/A;  Venia Minks DILATION N/A 04/29/2014   Procedure: Venia Minks DILATION;  Surgeon: Daneil Dolin, MD;  [facility];  Service: Endoscopy;  Laterality: N/A;  . RESECTION OF APICAL BLEB  03/17/2012   Procedure: RESECTION OF APICAL BLEB;  Surgeon: Ivin Poot, MD;  Location: [facility] OR;  Service: Thoracic;  Laterality: Right;  stapling of bleb  . SAVORY DILATION N/A 04/29/2014   Procedure: SAVORY DILATION;  Surgeon: Daneil Dolin, MD;  [facility];  Service: Endoscopy;  Laterality: N/A;  . TEE WITHOUT CARDIOVERSION N/A 11/15/2014   Procedure: TRANSESOPHAGEAL ECHOCARDIOGRAM (TEE);  Surgeon: Sueanne Margarita, MD;  Location: [facility] ENDOSCOPY;  Service: Cardiovascular;  Laterality: N/A;  . TUBAL LIGATION    . VIDEO ASSISTED THORACOSCOPY  03/17/2012   Procedure: VIDEO ASSISTED THORACOSCOPY;  Surgeon: Ivin Poot, MD;  Location: [facility] OR;  Service: Thoracic;  Laterality: Right;    REVIEW OF SYSTEMS:  Constitutional: positive for fatigue Eyes: negative Ears, nose, mouth, throat, and face: negative Respiratory: positive for cough, dyspnea on exertion and sputum Cardiovascular: negative Gastrointestinal:  negative Genitourinary:negative Integument/breast: negative Hematologic/lymphatic: negative Musculoskeletal:positive for muscle weakness Neurological: negative Behavioral/Psych: negative Endocrine: negative Allergic/Immunologic: negative   PHYSICAL EXAMINATION: General appearance: alert, cooperative, fatigued and no distress Head: Normocephalic, without obvious abnormality, atraumatic Neck: no adenopathy, no JVD, supple, symmetrical, trachea midline and thyroid not enlarged, symmetric, no tenderness/mass/nodules Lymph nodes: Cervical, supraclavicular, and axillary nodes normal. Resp: clear to auscultation bilaterally Back: symmetric, no curvature. ROM normal. No CVA tenderness. Cardio: regular rate and rhythm, S1, S2 normal, no murmur, click, rub or gallop GI: soft, non-tender; bowel sounds normal; no masses,  no organomegaly Extremities: edema 1+ Neurologic: Alert and oriented X 3, normal strength and tone. Normal symmetric reflexes. Normal coordination and gait  ECOG PERFORMANCE STATUS: 2 - Symptomatic, <50% confined to bed  Blood pressure (!) 160/65, pulse 81, temperature 98.6 F (37 C), temperature source Oral, resp. rate 18, height 5' 4"  (1.626 m), weight 100 lb 14.4 oz (45.8 kg), SpO2 100 %.  LABORATORY DATA: Lab Results  Component Value Date   WBC 2.6 (L) 11/07/2015   HGB 8.9 (L) 11/07/2015   HCT 28.3 (L) 11/07/2015   MCV 88.2 11/07/2015   PLT 225 11/07/2015      Chemistry      Component Value Date/Time   NA 142 11/07/2015 0901   K 3.2 (L) 11/07/2015 0901   CL 103 11/13/2014 1744   CO2 26 11/07/2015 0901   BUN 21.0 11/07/2015 0901   CREATININE 0.8 11/07/2015 0901      Component Value Date/Time   CALCIUM 8.3 (L) 11/07/2015 0901   ALKPHOS 153 (H) 11/07/2015 0901   AST 12 11/07/2015 0901   ALT <9 11/07/2015 0901   BILITOT 0.51 11/07/2015 0901       RADIOGRAPHIC STUDIES: Ct Chest W Contrast  Result Date: 11/07/2015 CLINICAL DATA:  Subsequent treatment  strategy for lung carcinoma diagnosed 2013. RIGHT lung resection. Radiation therapy ongoing. Oral chemotherapy ongoing. EXAM: CT CHEST, ABDOMEN, AND PELVIS WITH CONTRAST TECHNIQUE: Multidetector CT imaging of the chest, abdomen and pelvis was performed following the standard protocol during bolus administration of intravenous contrast. CONTRAST:  15m ISOVUE-300 IOPAMIDOL (ISOVUE-300) INJECTION 61% COMPARISON:  CT 08/08/2015 FINDINGS: CT CHEST FINDINGS Mediastinum/Nodes: No axillary or supraclavicular lymphadenopathy. Precarinal lymph node measures 14 mm compared to 13 mm for no change. No hilar adenopathy identified. Lungs/Pleura: RIGHT middle lobe nodule measuring 13 mm x 14 mm is increased from 10 mm by 8 mm. The 8 mm lymph RIGHT upper lobe nodule compares to 7 mm (image 55, series 5). RIGHT lower lobe nodule measuring 8 mm (image 80, series 5) increased from 5 mm. Within the LEFT lung upper lobe nodule on image 59, series 5 measures 9 mm increased from 5 mm). Within the LEFT lower lobe 9 mm nodule on image 101, series 5 is increased from 6 mm. Medial LEFT lower lobe nodules also increased on image 107) Musculoskeletal: No aggressive osseous lesion. CT ABDOMEN AND PELVIS FINDINGS Hepatobiliary: No focal hepatic lesion. Postcholecystectomy. No biliary dilatation. Pancreas: Chronic pancreatic duct dilatation hand calcification. Spleen: Normal spleen Adrenals/urinary tract: Adrenal glands and kidneys are normal. The ureters and bladder normal. Stomach/Bowel: Stomach, small bowel, appendix, and cecum are normal. The colon and rectosigmoid colon are normal. Vascular/Lymphatic: Abdominal aorta is normal caliber with atherosclerotic calcification. There is no retroperitoneal or periportal lymphadenopathy. Is aneurysmal dilatation of the infrarenal abdominal aorta to 41 mm not changed from prior. No pelvic lymphadenopathy. No upper abdominal adenopathy. Reproductive: Post hysterectomy Other: No aggressive osseous lesion.  Musculoskeletal: No aggressive osseous lesion. IMPRESSION: Chest Impression: 1. Interval increase in size of bilateral pulmonary nodules consists with PROGRESSION OF PULMONARY METASTASIS. 2. No evidence of progressive mediastinal nodal metastasis. 3. Stable consolidation in the RIGHT upper lobe. Abdomen / Pelvis Impression: 1. No evidence of metastatic disease in the abdomen or pelvis. 2. Stable large infrarenal abdominal aortic aneurysm (41 mm) . 3. Findings of chronic pancreatitis. Electronically Signed   By: SSuzy BouchardM.D.   On: 11/07/2015 13:15   Ct Abdomen Pelvis W Contrast  Result Date: 11/07/2015 CLINICAL DATA:  Subsequent treatment strategy for lung carcinoma diagnosed 2013. RIGHT lung resection. Radiation therapy ongoing. Oral chemotherapy ongoing. EXAM: CT CHEST, ABDOMEN, AND PELVIS WITH CONTRAST TECHNIQUE: Multidetector CT imaging of the chest, abdomen and pelvis was performed following the standard protocol during bolus administration of intravenous contrast. CONTRAST:  828mISOVUE-300 IOPAMIDOL (ISOVUE-300) INJECTION 61% COMPARISON:  CT 08/08/2015 FINDINGS: CT CHEST FINDINGS Mediastinum/Nodes: No axillary or supraclavicular  lymphadenopathy. Precarinal lymph node measures 14 mm compared to 13 mm for no change. No hilar adenopathy identified. Lungs/Pleura: RIGHT middle lobe nodule measuring 13 mm x 14 mm is increased from 10 mm by 8 mm. The 8 mm lymph RIGHT upper lobe nodule compares to 7 mm (image 55, series 5). RIGHT lower lobe nodule measuring 8 mm (image 80, series 5) increased from 5 mm. Within the LEFT lung upper lobe nodule on image 59, series 5 measures 9 mm increased from 5 mm). Within the LEFT lower lobe 9 mm nodule on image 101, series 5 is increased from 6 mm. Medial LEFT lower lobe nodules also increased on image 107) Musculoskeletal: No aggressive osseous lesion. CT ABDOMEN AND PELVIS FINDINGS Hepatobiliary: No focal hepatic lesion. Postcholecystectomy. No biliary dilatation.  Pancreas: Chronic pancreatic duct dilatation hand calcification. Spleen: Normal spleen Adrenals/urinary tract: Adrenal glands and kidneys are normal. The ureters and bladder normal. Stomach/Bowel: Stomach, small bowel, appendix, and cecum are normal. The colon and rectosigmoid colon are normal. Vascular/Lymphatic: Abdominal aorta is normal caliber with atherosclerotic calcification. There is no retroperitoneal or periportal lymphadenopathy. Is aneurysmal dilatation of the infrarenal abdominal aorta to 41 mm not changed from prior. No pelvic lymphadenopathy. No upper abdominal adenopathy. Reproductive: Post hysterectomy Other: No aggressive osseous lesion. Musculoskeletal: No aggressive osseous lesion. IMPRESSION: Chest Impression: 1. Interval increase in size of bilateral pulmonary nodules consists with PROGRESSION OF PULMONARY METASTASIS. 2. No evidence of progressive mediastinal nodal metastasis. 3. Stable consolidation in the RIGHT upper lobe. Abdomen / Pelvis Impression: 1. No evidence of metastatic disease in the abdomen or pelvis. 2. Stable large infrarenal abdominal aortic aneurysm (41 mm) . 3. Findings of chronic pancreatitis. Electronically Signed   By: Suzy Bouchard M.D.   On: 11/07/2015 13:15    ASSESSMENT AND PLAN: This is a very pleasant 75 years old with metastatic non-small cell lung cancer, adenocarcinoma Of the right upper lobe with positive EGFR mutation who is currently undergoing treatment with Tarceva 150 mg by mouth daily status post 16 months of treatment. The patient is tolerating her treatment fairly well with no significant adverse effects except for mild skin rash and few episodes of diarrhea. The recent CT scan of the chest, abdomen and pelvis showed some evidence of slow progression of the pulmonary nodules. None of these nodules is large enough for repeat biopsy to check for resistant mutation. I discussed the scan results with the patient and her daughter. I recommended for  the patient to continue her treatment with Tarceva with the same dose for now. I will continue to monitor the prior nodules) on the upcoming scans. I would consider the patient for repeat biopsy in the future to test for T790M resistant mutation. The previous blood test for the mutation was negative. For the back pain, she will continue on pain medication for now. I will see the patient back for follow-up visit in one month for reevaluation with repeat blood work.  She was advised to call immediately if she has any concerning symptoms in the interval. The patient voices understanding of current disease status and treatment options and is in agreement with the current care plan.  All questions were answered. The patient knows to call the clinic with any problems, questions or concerns. We can certainly see the patient much sooner if necessary.  Disclaimer: This note was dictated with voice recognition software. Similar sounding words can inadvertently be transcribed and may not be corrected upon review.

## 2015-11-17 ENCOUNTER — Other Ambulatory Visit: Payer: Self-pay | Admitting: Internal Medicine

## 2015-11-18 MED FILL — TARCEVA 150 MG TABLET: 150 | 30 days supply | Qty: 30 | Fill #0

## 2015-11-20 ENCOUNTER — Encounter: Payer: Self-pay | Admitting: Surgery

## 2015-11-23 DIAGNOSIS — J449 Chronic obstructive pulmonary disease, unspecified: Secondary | ICD-10-CM | POA: Diagnosis not present

## 2015-11-23 DIAGNOSIS — J439 Emphysema, unspecified: Secondary | ICD-10-CM | POA: Diagnosis not present

## 2015-11-28 DIAGNOSIS — H02055 Trichiasis without entropian left lower eyelid: Secondary | ICD-10-CM | POA: Diagnosis not present

## 2015-11-28 DIAGNOSIS — H02052 Trichiasis without entropian right lower eyelid: Secondary | ICD-10-CM | POA: Diagnosis not present

## 2015-11-28 DIAGNOSIS — H2513 Age-related nuclear cataract, bilateral: Secondary | ICD-10-CM | POA: Diagnosis not present

## 2015-11-28 DIAGNOSIS — H353131 Nonexudative age-related macular degeneration, bilateral, early dry stage: Secondary | ICD-10-CM | POA: Diagnosis not present

## 2015-12-10 DIAGNOSIS — J449 Chronic obstructive pulmonary disease, unspecified: Secondary | ICD-10-CM | POA: Diagnosis not present

## 2015-12-10 DIAGNOSIS — J9610 Chronic respiratory failure, unspecified whether with hypoxia or hypercapnia: Secondary | ICD-10-CM | POA: Diagnosis not present

## 2015-12-15 ENCOUNTER — Encounter: Payer: Self-pay | Admitting: Internal Medicine

## 2015-12-15 ENCOUNTER — Ambulatory Visit (HOSPITAL_BASED_OUTPATIENT_CLINIC_OR_DEPARTMENT_OTHER): Payer: PPO | Admitting: Internal Medicine

## 2015-12-15 ENCOUNTER — Telehealth: Payer: Self-pay | Admitting: Internal Medicine

## 2015-12-15 ENCOUNTER — Other Ambulatory Visit (HOSPITAL_BASED_OUTPATIENT_CLINIC_OR_DEPARTMENT_OTHER): Payer: PPO

## 2015-12-15 VITALS — BP 143/74 | HR 95 | Temp 98.1°F | Resp 18 | Ht 64.0 in | Wt 101.3 lb

## 2015-12-15 DIAGNOSIS — C3411 Malignant neoplasm of upper lobe, right bronchus or lung: Secondary | ICD-10-CM | POA: Diagnosis not present

## 2015-12-15 DIAGNOSIS — D649 Anemia, unspecified: Secondary | ICD-10-CM

## 2015-12-15 DIAGNOSIS — E876 Hypokalemia: Secondary | ICD-10-CM

## 2015-12-15 DIAGNOSIS — Z5111 Encounter for antineoplastic chemotherapy: Secondary | ICD-10-CM

## 2015-12-15 LAB — COMPREHENSIVE METABOLIC PANEL
ALBUMIN: 2.9 g/dL — AB (ref 3.5–5.0)
ALK PHOS: 169 U/L — AB (ref 40–150)
ALT: 12 U/L (ref 0–55)
AST: 15 U/L (ref 5–34)
Anion Gap: 9 mEq/L (ref 3–11)
BUN: 21 mg/dL (ref 7.0–26.0)
CALCIUM: 8.2 mg/dL — AB (ref 8.4–10.4)
CO2: 23 mEq/L (ref 22–29)
CREATININE: 0.8 mg/dL (ref 0.6–1.1)
Chloride: 109 mEq/L (ref 98–109)
EGFR: 69 mL/min/{1.73_m2} — AB (ref >90)
GLUCOSE: 87 mg/dL (ref 70–140)
POTASSIUM: 3.4 meq/L — AB (ref 3.5–5.1)
Sodium: 141 mEq/L (ref 136–145)
Total Bilirubin: 0.45 mg/dL (ref 0.20–1.20)
Total Protein: 5.8 g/dL — ABNORMAL LOW (ref 6.4–8.3)

## 2015-12-15 LAB — CBC WITH DIFFERENTIAL/PLATELET
BASO%: 1.1 % (ref 0.0–2.0)
BASOS ABS: 0 10*3/uL (ref 0.0–0.1)
EOS ABS: 0.2 10*3/uL (ref 0.0–0.5)
EOS%: 5.2 % (ref 0.0–7.0)
HEMATOCRIT: 26.9 % — AB (ref 34.8–46.6)
HEMOGLOBIN: 8.5 g/dL — AB (ref 11.6–15.9)
LYMPH#: 0.7 10*3/uL — AB (ref 0.9–3.3)
LYMPH%: 20.9 % (ref 14.0–49.7)
MCH: 26.8 pg (ref 25.1–34.0)
MCHC: 31.5 g/dL (ref 31.5–36.0)
MCV: 85.2 fL (ref 79.5–101.0)
MONO#: 0.3 10*3/uL (ref 0.1–0.9)
MONO%: 8.6 % (ref 0.0–14.0)
NEUT#: 2.1 10*3/uL (ref 1.5–6.5)
NEUT%: 64.2 % (ref 38.4–76.8)
PLATELETS: 255 10*3/uL (ref 145–400)
RBC: 3.15 10*6/uL — ABNORMAL LOW (ref 3.70–5.45)
RDW: 15.2 % — AB (ref 11.2–14.5)
WBC: 3.2 10*3/uL — ABNORMAL LOW (ref 3.9–10.3)

## 2015-12-15 NOTE — Progress Notes (Signed)
    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 The patient has multifocal disease including 2 nodules in the right lung with a suspicious tiny lesion in the left lung.  PRIOR THERAPY: Palliative radiotherapy to the enlarging right upper lobe lung mass under the care of Dr. Wentworth completed on 01/06/2015.  CURRENT THERAPY:  1) Tarceva 150 mg by mouth daily status post 17 months of treatment.  INTERVAL HISTORY: [name] 75 y.o. female returns to the clinic today for follow-up visit accompanied by her daughter.The patient is feeling well today with no specific complaints except for mild fatigue and the baseline shortness of breath. She is on home oxygen and her oxygen saturation is 100% on 2 L/minute nasal cannula. The patient denied having any significant chest pain, cough or hemoptysis. She is currently on home oxygen. She is tolerating her treatment with oral Tarceva fairly well with no significant adverse effects except for grade 1 skin rash and occasional episodes of diarrhea. She had repeat blood work earlier today and she is here for evaluation.  MEDICAL HISTORY: Past Medical History:  Diagnosis Date  . AAA (abdominal aortic aneurysm) (HCC)   . Anemia of chronic disease 11/14/2014  . Chronic kidney disease   . COPD (chronic obstructive pulmonary disease) (HCC)   . Hypertension   . Lung cancer (HCC)   . Pernicious anemia   . Primary cancer of right upper lobe of lung (HCC) 05/10/2012  . Stroke (HCC)   . Thyroid disease     ALLERGIES:  is allergic to codeine; penicillins; and pravastatin sodium.  MEDICATIONS:  Current Outpatient Prescriptions  Medication Sig Dispense Refill  . acetaminophen (TYLENOL) 500 MG tablet Take  500-1,000 mg by mouth every 6 (six) hours as needed for moderate pain.     . albuterol (PROVENTIL) (2.5 MG/3ML) 0.083% nebulizer solution Take 2.5 mg by nebulization every 4 (four) hours. Or Four times daily    . ALPRAZolam (XANAX) 1 MG tablet Take 1 mg by mouth 4 (four) times daily as needed. For anxiety and/or sleep    . amLODipine (NORVASC) 2.5 MG tablet Take 2.5 mg by mouth daily.  11  . aspirin EC 81 MG EC tablet Take 1 tablet (81 mg total) by mouth daily.    . clindamycin (CLEOCIN T) 1 % external solution Apply topically 2 (two) times daily. (Patient taking differently: Apply 1 application topically as needed. ) 30 mL 1  . Cyanocobalamin (VITAMIN B-12) 1000 MCG SUBL Place 1 tablet under the tongue daily.    . ferrous sulfate 325 (65 FE) MG tablet Take 325 mg by mouth 3 (three) times daily with meals.    . Fluticasone-Salmeterol (ADVAIR) 250-50 MCG/DOSE AEPB Inhale 1 puff into the lungs every 12 (twelve) hours.    . guaiFENesin (MUCINEX) 600 MG 12 hr tablet Take 600 mg by mouth daily as needed for cough.     . levothyroxine (SYNTHROID, LEVOTHROID) 100 MCG tablet Take 100 mcg by mouth daily before breakfast.     . loperamide (IMODIUM) 2 MG capsule Take 2 mg by mouth as needed for diarrhea or loose stools.    . loratadine (CLARITIN) 10 MG tablet Take 10 mg by mouth daily as needed for allergies.     . mirtazapine (REMERON)   7.5 MG tablet Take 7.5 mg by mouth at bedtime.  12  . NON FORMULARY OXYGEN      24/7    . potassium chloride SA (K-DUR,KLOR-CON) 20 MEQ tablet Take 1 tablet (20 mEq total) by mouth daily. 7 tablet 0  . TARCEVA 150 MG tablet TAKE 1 TABLET BY MOUTH DAILY. TAKE ON AN EMPTY STOMACH 1 HOUR BEFORE MEALS OR 2 HOURS AFTER. 30 tablet 2  . Vitamin D, Ergocalciferol, (DRISDOL) 50000 UNITS CAPS capsule Take 50,000 Units by mouth every 30 (thirty) days. Reported on 10/09/2015     No current facility-administered medications for this visit.     SURGICAL HISTORY:  Past Surgical History:    Procedure Laterality Date  . ABDOMINAL HYSTERECTOMY    . APPENDECTOMY    . CHEST TUBE INSERTION  03/08/2012   Procedure: CHEST TUBE INSERTION;  Surgeon: Ivin Poot, MD;  Location: [place];  Service: Thoracic;  Laterality: Right;  . CHOLECYSTECTOMY    . COLONOSCOPY N/A 04/29/2014   Procedure: COLONOSCOPY;  Surgeon: Daneil Dolin, MD;  [facility];  Service: Endoscopy;  Laterality: N/A;  1245pm  . ESOPHAGOGASTRODUODENOSCOPY N/A 04/29/2014   Procedure: ESOPHAGOGASTRODUODENOSCOPY (EGD);  Surgeon: Daneil Dolin, MD;  [facility];  Service: Endoscopy;  Laterality: N/A;  Venia Minks DILATION N/A 04/29/2014   Procedure: Venia Minks DILATION;  Surgeon: Daneil Dolin, MD;  [facility];  Service: Endoscopy;  Laterality: N/A;  . RESECTION OF APICAL BLEB  03/17/2012   Procedure: RESECTION OF APICAL BLEB;  Surgeon: Ivin Poot, MD;  [facility] OR;  Service: Thoracic;  Laterality: Right;  stapling of bleb  . SAVORY DILATION N/A 04/29/2014   Procedure: SAVORY DILATION;  Surgeon: Daneil Dolin, MD;  [facility];  Service: Endoscopy;  Laterality: N/A;  . TEE WITHOUT CARDIOVERSION N/A 11/15/2014   Procedure: TRANSESOPHAGEAL ECHOCARDIOGRAM (TEE);  Surgeon: Sueanne Margarita, MD;  Location: [facility] ENDOSCOPY;  Service: Cardiovascular;  Laterality: N/A;  . TUBAL LIGATION    . VIDEO ASSISTED THORACOSCOPY  03/17/2012   Procedure: VIDEO ASSISTED THORACOSCOPY;  Surgeon: Ivin Poot, MD;  Location[facility] OR;  Service: Thoracic;  Laterality: Right;    REVIEW OF SYSTEMS:  A comprehensive review of systems was negative except for: Constitutional: positive for fatigue Respiratory: positive for dyspnea on exertion   PHYSICAL EXAMINATION: General appearance: alert, cooperative, fatigued and no distress Head: Normocephalic, without obvious abnormality, atraumatic Neck: no adenopathy, no JVD, supple, symmetrical, trachea midline and thyroid not enlarged, symmetric, no  tenderness/mass/nodules Lymph nodes: Cervical, supraclavicular, and axillary nodes normal. Resp: clear to auscultation bilaterally Back: symmetric, no curvature. ROM normal. No CVA tenderness. Cardio: regular rate and rhythm, S1, S2 normal, no murmur, click, rub or gallop GI: soft, non-tender; bowel sounds normal; no masses,  no organomegaly Extremities: edema 1+ Neurologic: Alert and oriented X 3, normal strength and tone. Normal symmetric reflexes. Normal coordination and gait  ECOG PERFORMANCE STATUS: 2 - Symptomatic, <50% confined to bed  There were no vitals taken for this visit.  LABORATORY DATA: Lab Results  Component Value Date   WBC 3.2 (L) 12/15/2015   HGB 8.5 (L) 12/15/2015   HCT 26.9 (L) 12/15/2015   MCV 85.2 12/15/2015   PLT 255 12/15/2015      Chemistry      Component Value Date/Time   NA 142 11/07/2015 0901   K 3.2 (L) 11/07/2015 0901   CL 103 11/13/2014 1744   CO2 26  11/07/2015 0901   BUN 21.0 11/07/2015 0901   CREATININE 0.8 11/07/2015 0901      Component Value Date/Time   CALCIUM 8.3 (L) 11/07/2015 0901   ALKPHOS 153 (H) 11/07/2015 0901   AST 12 11/07/2015 0901   ALT <9 11/07/2015 0901   BILITOT 0.51 11/07/2015 0901       RADIOGRAPHIC STUDIES: No results found.  ASSESSMENT AND PLAN: This is a very pleasant 75 years old with metastatic non-small cell lung cancer, adenocarcinoma of the right upper lobe with positive EGFR mutation who is currently undergoing treatment with Tarceva 150 mg by mouth daily status post 17 months of treatment. The patient is tolerating her treatment fairly well with no significant adverse effects except for mild skin rash and few episodes of diarrhea. I recommended for the patient to continue her treatment with Tarceva with the same dose for now.  For the back pain, she will continue on pain medication for now. For the anemia, I advised the patient to continue on oral iron tablets for now. I will see the patient back for  follow-up visit in one month for reevaluation with repeat blood work.  She was advised to call immediately if she has any concerning symptoms in the interval. The patient voices understanding of current disease status and treatment options and is in agreement with the current care plan.  All questions were answered. The patient knows to call the clinic with any problems, questions or concerns. We can certainly see the patient much sooner if necessary.  Disclaimer: This note was dictated with voice recognition software. Similar sounding words can inadvertently be transcribed and may not be corrected upon review.

## 2015-12-15 NOTE — Telephone Encounter (Signed)
Gave patient avs report and appointments for October  °

## 2015-12-16 MED FILL — TARCEVA 150 MG TABLET: 150 | 30 days supply | Qty: 30 | Fill #1

## 2015-12-24 DIAGNOSIS — J449 Chronic obstructive pulmonary disease, unspecified: Secondary | ICD-10-CM | POA: Diagnosis not present

## 2015-12-24 DIAGNOSIS — J439 Emphysema, unspecified: Secondary | ICD-10-CM | POA: Diagnosis not present

## 2016-01-06 DIAGNOSIS — J9610 Chronic respiratory failure, unspecified whether with hypoxia or hypercapnia: Secondary | ICD-10-CM | POA: Diagnosis not present

## 2016-01-06 DIAGNOSIS — I10 Essential (primary) hypertension: Secondary | ICD-10-CM | POA: Diagnosis not present

## 2016-01-06 DIAGNOSIS — C3490 Malignant neoplasm of unspecified part of unspecified bronchus or lung: Secondary | ICD-10-CM | POA: Diagnosis not present

## 2016-01-06 DIAGNOSIS — Z23 Encounter for immunization: Secondary | ICD-10-CM | POA: Diagnosis not present

## 2016-01-06 DIAGNOSIS — J449 Chronic obstructive pulmonary disease, unspecified: Secondary | ICD-10-CM | POA: Diagnosis not present

## 2016-01-09 DIAGNOSIS — J449 Chronic obstructive pulmonary disease, unspecified: Secondary | ICD-10-CM | POA: Diagnosis not present

## 2016-01-09 DIAGNOSIS — J9610 Chronic respiratory failure, unspecified whether with hypoxia or hypercapnia: Secondary | ICD-10-CM | POA: Diagnosis not present

## 2016-01-12 MED FILL — TARCEVA 150 MG TABLET: 150 | 30 days supply | Qty: 30 | Fill #2

## 2016-01-13 ENCOUNTER — Ambulatory Visit (HOSPITAL_BASED_OUTPATIENT_CLINIC_OR_DEPARTMENT_OTHER): Payer: PPO | Admitting: Internal Medicine

## 2016-01-13 ENCOUNTER — Encounter: Payer: Self-pay | Admitting: Internal Medicine

## 2016-01-13 ENCOUNTER — Telehealth: Payer: Self-pay | Admitting: Internal Medicine

## 2016-01-13 ENCOUNTER — Other Ambulatory Visit (HOSPITAL_BASED_OUTPATIENT_CLINIC_OR_DEPARTMENT_OTHER): Payer: PPO

## 2016-01-13 VITALS — BP 179/52 | HR 79 | Temp 97.8°F | Resp 17 | Ht 64.0 in | Wt 105.1 lb

## 2016-01-13 DIAGNOSIS — M549 Dorsalgia, unspecified: Secondary | ICD-10-CM

## 2016-01-13 DIAGNOSIS — C3411 Malignant neoplasm of upper lobe, right bronchus or lung: Secondary | ICD-10-CM | POA: Diagnosis not present

## 2016-01-13 DIAGNOSIS — Z5111 Encounter for antineoplastic chemotherapy: Secondary | ICD-10-CM

## 2016-01-13 DIAGNOSIS — D649 Anemia, unspecified: Secondary | ICD-10-CM

## 2016-01-13 LAB — CBC WITH DIFFERENTIAL/PLATELET
BASO%: 0.9 % (ref 0.0–2.0)
BASOS ABS: 0 10*3/uL (ref 0.0–0.1)
EOS ABS: 0.1 10*3/uL (ref 0.0–0.5)
EOS%: 4.4 % (ref 0.0–7.0)
HEMATOCRIT: 25.1 % — AB (ref 34.8–46.6)
HEMOGLOBIN: 7.7 g/dL — AB (ref 11.6–15.9)
LYMPH#: 0.6 10*3/uL — AB (ref 0.9–3.3)
LYMPH%: 22.5 % (ref 14.0–49.7)
MCH: 26.2 pg (ref 25.1–34.0)
MCHC: 30.8 g/dL — ABNORMAL LOW (ref 31.5–36.0)
MCV: 85 fL (ref 79.5–101.0)
MONO#: 0.3 10*3/uL (ref 0.1–0.9)
MONO%: 10.8 % (ref 0.0–14.0)
NEUT%: 61.4 % (ref 38.4–76.8)
NEUTROS ABS: 1.6 10*3/uL (ref 1.5–6.5)
PLATELETS: 242 10*3/uL (ref 145–400)
RBC: 2.95 10*6/uL — ABNORMAL LOW (ref 3.70–5.45)
RDW: 15.2 % — AB (ref 11.2–14.5)
WBC: 2.6 10*3/uL — AB (ref 3.9–10.3)

## 2016-01-13 LAB — COMPREHENSIVE METABOLIC PANEL
ALBUMIN: 2.6 g/dL — AB (ref 3.5–5.0)
ALK PHOS: 179 U/L — AB (ref 40–150)
ALT: <9 U/L (ref 0–55)
AST: 14 U/L (ref 5–34)
Anion Gap: 8 mEq/L (ref 3–11)
BILIRUBIN TOTAL: 0.51 mg/dL (ref 0.20–1.20)
BUN: 15.8 mg/dL (ref 7.0–26.0)
CALCIUM: 7.9 mg/dL — AB (ref 8.4–10.4)
CO2: 25 mEq/L (ref 22–29)
Chloride: 109 mEq/L (ref 98–109)
Creatinine: 0.8 mg/dL (ref 0.6–1.1)
EGFR: 74 mL/min/{1.73_m2} — AB (ref >90)
GLUCOSE: 93 mg/dL (ref 70–140)
Potassium: 3.4 mEq/L — ABNORMAL LOW (ref 3.5–5.1)
SODIUM: 142 meq/L (ref 136–145)
TOTAL PROTEIN: 5.3 g/dL — AB (ref 6.4–8.3)

## 2016-01-13 NOTE — Telephone Encounter (Signed)
Gave patient avs report and appointments for November. Central radiology will call re scan.

## 2016-01-13 NOTE — Progress Notes (Signed)
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 The patient has multifocal disease including 2 nodules in the right lung with a suspicious tiny lesion in the left lung.  PRIOR THERAPY: Palliative radiotherapy to the enlarging right upper lobe lung mass under the care of Dr. Pablo Ledger completed on 01/06/2015.  CURRENT THERAPY:  1) Tarceva 150 mg by mouth daily status post 18 months of treatment.  INTERVAL HISTORY: [name] 75 y.o. female returns to the clinic today for follow-up visit accompanied by her daughter.The patient is feeling well today with no specific complaints except for mild fatigue and the baseline shortness of breath and she is currently on home oxygen. The patient denied having any significant chest pain, cough or hemoptysis. She is currently on home oxygen. She is tolerating her treatment with oral Tarceva fairly well with no significant adverse effects except for grade 1 skin rash and occasional episodes of diarrhea. She had repeat blood work earlier today and she is here for evaluation.  MEDICAL HISTORY: Past Medical History:  Diagnosis Date  . AAA (abdominal aortic aneurysm) ([place])   . Anemia of chronic disease 11/14/2014  . Chronic kidney disease   . COPD (chronic obstructive pulmonary disease) ([place])   . Hypertension   . Lung cancer ([place])   . Pernicious anemia   . Primary cancer of right upper lobe of lung ([place]) 05/10/2012  . Stroke ([place])   . Thyroid disease     ALLERGIES:  is allergic to codeine; penicillins; and pravastatin sodium.  MEDICATIONS:  Current Outpatient Prescriptions  Medication Sig Dispense Refill  . acetaminophen (TYLENOL) 500 MG tablet Take 500-1,000 mg by mouth every 6 (six) hours as needed  for moderate pain.     Marland Kitchen albuterol (PROVENTIL) (2.5 MG/3ML) 0.083% nebulizer solution Take 2.5 mg by nebulization every 4 (four) hours. Or Four times daily    . ALPRAZolam (XANAX) 1 MG tablet Take 1 mg by mouth 4 (four) times daily as needed. For anxiety and/or sleep    . amLODipine (NORVASC) 2.5 MG tablet Take 2.5 mg by mouth daily.  11  . aspirin EC 81 MG EC tablet Take 1 tablet (81 mg total) by mouth daily.    . clindamycin (CLEOCIN T) 1 % external solution Apply topically 2 (two) times daily. (Patient taking differently: Apply 1 application topically as needed. ) 30 mL 1  . Cyanocobalamin (VITAMIN B-12) 1000 MCG SUBL Place 1 tablet under the tongue daily.    . ferrous sulfate 325 (65 FE) MG tablet Take 325 mg by mouth 3 (three) times daily with meals.    . Fluticasone-Salmeterol (ADVAIR) 250-50 MCG/DOSE AEPB Inhale 1 puff into the lungs every 12 (twelve) hours.    Marland Kitchen guaiFENesin (MUCINEX) 600 MG 12 hr tablet Take 600 mg by mouth daily as needed for cough.     . levothyroxine (SYNTHROID, LEVOTHROID) 100 MCG tablet Take 100 mcg by mouth daily before breakfast.     . loperamide (IMODIUM) 2 MG capsule Take 2 mg by mouth as needed for diarrhea or loose stools.    Marland Kitchen loratadine (CLARITIN) 10 MG tablet Take 10 mg by mouth daily as needed for allergies.     . mirtazapine (REMERON) 7.5 MG tablet Take 7.5 mg by mouth at  bedtime.  12  . NON FORMULARY OXYGEN      24/7    . potassium chloride SA (K-DUR,KLOR-CON) 20 MEQ tablet Take 1 tablet (20 mEq total) by mouth daily. 7 tablet 0  . TARCEVA 150 MG tablet TAKE 1 TABLET BY MOUTH DAILY. TAKE ON AN EMPTY STOMACH 1 HOUR BEFORE MEALS OR 2 HOURS AFTER. 30 tablet 2  . Vitamin D, Ergocalciferol, (DRISDOL) 50000 UNITS CAPS capsule Take 50,000 Units by mouth every 30 (thirty) days. Reported on 10/09/2015     No current facility-administered medications for this visit.     SURGICAL HISTORY:  Past Surgical History:  Procedure Laterality Date  . ABDOMINAL  HYSTERECTOMY    . APPENDECTOMY    . CHEST TUBE INSERTION  03/08/2012   Procedure: CHEST TUBE INSERTION;  Surgeon: Ivin Poot, MD;  Location: [facility];  Service: Thoracic;  Laterality: Right;  . CHOLECYSTECTOMY    . COLONOSCOPY N/A 04/29/2014   Procedure: COLONOSCOPY;  Surgeon: Daneil Dolin, MD;  [facility];  Service: Endoscopy;  Laterality: N/A;  1245pm  . ESOPHAGOGASTRODUODENOSCOPY N/A 04/29/2014   Procedure: ESOPHAGOGASTRODUODENOSCOPY (EGD);  Surgeon: Daneil Dolin, MD;  [facility];  Service: Endoscopy;  Laterality: N/A;  Venia Minks DILATION N/A 04/29/2014   Procedure: Venia Minks DILATION;  Surgeon: Daneil Dolin, MD;  [facility];  Service: Endoscopy;  Laterality: N/A;  . RESECTION OF APICAL BLEB  03/17/2012   Procedure: RESECTION OF APICAL BLEB;  Surgeon: Ivin Poot, MD;  Location: [facility] OR;  Service: Thoracic;  Laterality: Right;  stapling of bleb  . SAVORY DILATION N/A 04/29/2014   Procedure: SAVORY DILATION;  Surgeon: Daneil Dolin, MD;  [facility];  Service: Endoscopy;  Laterality: N/A;  . TEE WITHOUT CARDIOVERSION N/A 11/15/2014   Procedure: TRANSESOPHAGEAL ECHOCARDIOGRAM (TEE);  Surgeon: Sueanne Margarita, MD;  [facility] ENDOSCOPY;  Service: Cardiovascular;  Laterality: N/A;  . TUBAL LIGATION    . VIDEO ASSISTED THORACOSCOPY  03/17/2012   Procedure: VIDEO ASSISTED THORACOSCOPY;  Surgeon: Ivin Poot, MD;  Location: [facility] OR;  Service: Thoracic;  Laterality: Right;    REVIEW OF SYSTEMS:  A comprehensive review of systems was negative except for: Constitutional: positive for fatigue Respiratory: positive for dyspnea on exertion   PHYSICAL EXAMINATION: General appearance: alert, cooperative, fatigued and no distress Head: Normocephalic, without obvious abnormality, atraumatic Neck: no adenopathy, no JVD, supple, symmetrical, trachea midline and thyroid not enlarged, symmetric, no tenderness/mass/nodules Lymph nodes: Cervical,  supraclavicular, and axillary nodes normal. Resp: clear to auscultation bilaterally Back: symmetric, no curvature. ROM normal. No CVA tenderness. Cardio: regular rate and rhythm, S1, S2 normal, no murmur, click, rub or gallop GI: soft, non-tender; bowel sounds normal; no masses,  no organomegaly Extremities: edema 1+ Neurologic: Alert and oriented X 3, normal strength and tone. Normal symmetric reflexes. Normal coordination and gait  ECOG PERFORMANCE STATUS: 2 - Symptomatic, <50% confined to bed  Blood pressure (!) 179/52, pulse 79, temperature 97.8 F (36.6 C), temperature source Oral, resp. rate 17, height 5' 4"  (1.626 m), weight 105 lb 1.6 oz (47.7 kg), SpO2 99 %. Repeat blood pressure was 152/54  LABORATORY DATA: Lab Results  Component Value Date   WBC 2.6 (L) 01/13/2016   HGB 7.7 (L) 01/13/2016   HCT 25.1 (L) 01/13/2016   MCV 85.0 01/13/2016   PLT 242 01/13/2016      Chemistry      Component Value Date/Time   NA 142  01/13/2016 1312   K 3.4 (L) 01/13/2016 1312   CL 103 11/13/2014 1744   CO2 25 01/13/2016 1312   BUN 15.8 01/13/2016 1312   CREATININE 0.8 01/13/2016 1312      Component Value Date/Time   CALCIUM 7.9 (L) 01/13/2016 1312   ALKPHOS 179 (H) 01/13/2016 1312   AST 14 01/13/2016 1312   ALT <9 01/13/2016 1312   BILITOT 0.51 01/13/2016 1312       RADIOGRAPHIC STUDIES: No results found.  ASSESSMENT AND PLAN: This is a very pleasant 75 years old with metastatic non-small cell lung cancer, adenocarcinoma of the right upper lobe with positive EGFR mutation who is currently undergoing treatment with Tarceva 150 mg by mouth daily status post 17 months of treatment. The patient is tolerating her treatment fairly well with no significant adverse effects except for mild skin rash and few episodes of diarrhea. I recommended for the patient to continue her treatment with Tarceva with the same dose for now.  For the back pain, she will continue on pain medication for  now. For the anemia, I advised the patient to continue on oral iron tablets for now. I will see the patient back for follow-up visit in one month for reevaluation with repeat blood work as well as CT scan of the chest, abdomen and pelvis for restaging of her disease.  She was advised to call immediately if she has any concerning symptoms in the interval. The patient voices understanding of current disease status and treatment options and is in agreement with the current care plan.  All questions were answered. The patient knows to call the clinic with any problems, questions or concerns. We can certainly see the patient much sooner if necessary.  Disclaimer: This note was dictated with voice recognition software. Similar sounding words can inadvertently be transcribed and may not be corrected upon review.

## 2016-01-23 DIAGNOSIS — J439 Emphysema, unspecified: Secondary | ICD-10-CM | POA: Diagnosis not present

## 2016-01-23 DIAGNOSIS — J449 Chronic obstructive pulmonary disease, unspecified: Secondary | ICD-10-CM | POA: Diagnosis not present

## 2016-02-09 DIAGNOSIS — J449 Chronic obstructive pulmonary disease, unspecified: Secondary | ICD-10-CM | POA: Diagnosis not present

## 2016-02-09 DIAGNOSIS — J9610 Chronic respiratory failure, unspecified whether with hypoxia or hypercapnia: Secondary | ICD-10-CM | POA: Diagnosis not present

## 2016-02-10 ENCOUNTER — Other Ambulatory Visit: Payer: Self-pay | Admitting: Internal Medicine

## 2016-02-11 ENCOUNTER — Other Ambulatory Visit (HOSPITAL_BASED_OUTPATIENT_CLINIC_OR_DEPARTMENT_OTHER): Payer: PPO

## 2016-02-11 ENCOUNTER — Encounter (HOSPITAL_COMMUNITY): Payer: Self-pay

## 2016-02-11 ENCOUNTER — Ambulatory Visit (HOSPITAL_COMMUNITY)
Admission: RE | Admit: 2016-02-11 | Discharge: 2016-02-11 | Disposition: A | Discharge status: Home / Self Care | Payer: PPO | Source: Ambulatory Visit | Attending: Internal Medicine | Admitting: Internal Medicine

## 2016-02-11 DIAGNOSIS — I2584 Coronary atherosclerosis due to calcified coronary lesion: Secondary | ICD-10-CM | POA: Diagnosis not present

## 2016-02-11 DIAGNOSIS — K861 Other chronic pancreatitis: Secondary | ICD-10-CM | POA: Diagnosis not present

## 2016-02-11 DIAGNOSIS — I2721 Secondary pulmonary arterial hypertension: Secondary | ICD-10-CM | POA: Insufficient documentation

## 2016-02-11 DIAGNOSIS — J439 Emphysema, unspecified: Secondary | ICD-10-CM | POA: Diagnosis not present

## 2016-02-11 DIAGNOSIS — Z5111 Encounter for antineoplastic chemotherapy: Secondary | ICD-10-CM

## 2016-02-11 DIAGNOSIS — N200 Calculus of kidney: Secondary | ICD-10-CM | POA: Diagnosis not present

## 2016-02-11 DIAGNOSIS — I700 Atherosclerosis of aorta: Secondary | ICD-10-CM | POA: Insufficient documentation

## 2016-02-11 DIAGNOSIS — C3411 Malignant neoplasm of upper lobe, right bronchus or lung: Secondary | ICD-10-CM

## 2016-02-11 DIAGNOSIS — I719 Aortic aneurysm of unspecified site, without rupture: Secondary | ICD-10-CM | POA: Insufficient documentation

## 2016-02-11 DIAGNOSIS — C7800 Secondary malignant neoplasm of unspecified lung: Secondary | ICD-10-CM | POA: Insufficient documentation

## 2016-02-11 DIAGNOSIS — I2510 Atherosclerotic heart disease of native coronary artery without angina pectoris: Secondary | ICD-10-CM | POA: Diagnosis not present

## 2016-02-11 LAB — CBC WITH DIFFERENTIAL/PLATELET
BASO%: 0.7 % (ref 0.0–2.0)
Basophils Absolute: 0 10*3/uL (ref 0.0–0.1)
EOS%: 4.6 % (ref 0.0–7.0)
Eosinophils Absolute: 0.2 10*3/uL (ref 0.0–0.5)
HEMATOCRIT: 27.3 % — AB (ref 34.8–46.6)
HGB: 8.3 g/dL — ABNORMAL LOW (ref 11.6–15.9)
LYMPH#: 0.9 10*3/uL (ref 0.9–3.3)
LYMPH%: 21.3 % (ref 14.0–49.7)
MCH: 25.9 pg (ref 25.1–34.0)
MCHC: 30.4 g/dL — AB (ref 31.5–36.0)
MCV: 85 fL (ref 79.5–101.0)
MONO#: 0.5 10*3/uL (ref 0.1–0.9)
MONO%: 12.7 % (ref 0.0–14.0)
NEUT%: 60.7 % (ref 38.4–76.8)
NEUTROS ABS: 2.5 10*3/uL (ref 1.5–6.5)
Platelets: 286 10*3/uL (ref 145–400)
RBC: 3.21 10*6/uL — AB (ref 3.70–5.45)
RDW: 14.6 % — AB (ref 11.2–14.5)
WBC: 4.1 10*3/uL (ref 3.9–10.3)

## 2016-02-11 LAB — COMPREHENSIVE METABOLIC PANEL
ALT: 11 U/L (ref 0–55)
AST: 13 U/L (ref 5–34)
Albumin: 2.9 g/dL — ABNORMAL LOW (ref 3.5–5.0)
Alkaline Phosphatase: 190 U/L — ABNORMAL HIGH (ref 40–150)
Anion Gap: 10 mEq/L (ref 3–11)
BUN: 15.3 mg/dL (ref 7.0–26.0)
CALCIUM: 8.5 mg/dL (ref 8.4–10.4)
CHLORIDE: 111 meq/L — AB (ref 98–109)
CO2: 24 meq/L (ref 22–29)
CREATININE: 0.8 mg/dL (ref 0.6–1.1)
EGFR: 77 mL/min/{1.73_m2} — ABNORMAL LOW (ref >90)
Glucose: 95 mg/dl (ref 70–140)
Potassium: 3.5 mEq/L (ref 3.5–5.1)
Sodium: 144 mEq/L (ref 136–145)
TOTAL PROTEIN: 6 g/dL — AB (ref 6.4–8.3)
Total Bilirubin: 0.51 mg/dL (ref 0.20–1.20)

## 2016-02-11 MED ORDER — IOPAMIDOL (ISOVUE-300) INJECTION 61%
100.0000 mL | Freq: Once | INTRAVENOUS | Status: AC | PRN
  Administered 2016-02-11: 80 mL via INTRAVENOUS

## 2016-02-11 MED FILL — TARCEVA 150 MG TABLET: 150 | 30 days supply | Qty: 30 | Fill #0

## 2016-02-12 ENCOUNTER — Telehealth: Payer: Self-pay | Admitting: Pharmacist

## 2016-02-12 NOTE — Telephone Encounter (Signed)
Oral Chemotherapy Pharmacist Encounter  Received call from patient with questions about copay assistance for her Tarceva. Patient had been enrolled for copay assistance through [employer] and they have informed patient that they will not have available copay assistance funds for non-small cell lung cancer for 2018.  While on the phone with patient I looked for other foundation assistance and there are no monies available from [employer], [employer], [employer], or [employer]. I will start application for [place] to [facility]) for manufacturer medication support.  Patient will bring in requested financial documentation when she comes to see Dr. Julien Nordmann on 11/15. At that time patient will sign application for [employer] and oral chemo clinic will fax to manufacturer.  Patient understands and is in agreement with above plan.  Oral Chemo Clinic will continue to follow.  Johny Drilling, PharmD, BCPS 02/12/2016  11:21 AM Oral Chemotherapy Clinic [phone]

## 2016-02-18 ENCOUNTER — Encounter: Payer: Self-pay | Admitting: Internal Medicine

## 2016-02-18 ENCOUNTER — Ambulatory Visit (HOSPITAL_BASED_OUTPATIENT_CLINIC_OR_DEPARTMENT_OTHER): Payer: PPO | Admitting: Internal Medicine

## 2016-02-18 ENCOUNTER — Telehealth: Payer: Self-pay | Admitting: Internal Medicine

## 2016-02-18 VITALS — BP 175/78 | HR 87 | Temp 98.1°F | Resp 18 | Ht 64.0 in | Wt 101.9 lb

## 2016-02-18 DIAGNOSIS — D649 Anemia, unspecified: Secondary | ICD-10-CM | POA: Diagnosis not present

## 2016-02-18 DIAGNOSIS — I714 Abdominal aortic aneurysm, without rupture, unspecified: Secondary | ICD-10-CM

## 2016-02-18 DIAGNOSIS — C3411 Malignant neoplasm of upper lobe, right bronchus or lung: Secondary | ICD-10-CM

## 2016-02-18 DIAGNOSIS — I10 Essential (primary) hypertension: Secondary | ICD-10-CM

## 2016-02-18 DIAGNOSIS — M7989 Other specified soft tissue disorders: Secondary | ICD-10-CM

## 2016-02-18 DIAGNOSIS — Z5111 Encounter for antineoplastic chemotherapy: Secondary | ICD-10-CM

## 2016-02-18 DIAGNOSIS — J449 Chronic obstructive pulmonary disease, unspecified: Secondary | ICD-10-CM

## 2016-02-18 NOTE — Progress Notes (Signed)
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 The patient has multifocal disease including 2 nodules in the right lung with a suspicious tiny lesion in the left lung.  PRIOR THERAPY: Palliative radiotherapy to the enlarging right upper lobe lung mass under the care of Dr. Pablo Ledger completed on 01/06/2015.  CURRENT THERAPY:  1) Tarceva 150 mg by mouth daily status post 19 months of treatment.  INTERVAL HISTORY: [name] 75 y.o. female returns to the clinic today for follow-up visit accompanied by her daughter. The patient continues to complain of her baseline shortness of breath and mild non-productive cough. She has no hemoptysis. She is on home oxygen. She is tolerating her treatment with oral Tarceva fairly well with no significant adverse effects except for mild skin rash and occasional episodes of diarrhea. She will surround 4 pounds since her last visit. She has no nausea or vomiting. She has no fever or chills. She continues to have swelling of the lower extremities and she is currently on torsemide by her primary care physician. She also has uncontrolled hypertension and she is currently on amlodipine. She had repeat CT scan of the chest, abdomen and pelvis performed recently and she is here for evaluation and discussion of her scan results.  MEDICAL HISTORY: Past Medical History:  Diagnosis Date  . AAA (abdominal aortic aneurysm) ([place])   . Anemia of chronic disease 11/14/2014  . Chronic kidney disease   . COPD (chronic obstructive pulmonary disease) ([place])   . Hypertension   . Lung cancer ([place])   . Pernicious anemia   . Primary cancer of right upper lobe of lung ([place]) 05/10/2012  . Stroke ([place])   . Thyroid disease      ALLERGIES:  is allergic to codeine; penicillins; and pravastatin sodium.  MEDICATIONS:  Current Outpatient Prescriptions  Medication Sig Dispense Refill  . acetaminophen (TYLENOL) 500 MG tablet Take 500-1,000 mg by mouth every 6 (six) hours as needed for moderate pain.     Marland Kitchen albuterol (PROVENTIL) (2.5 MG/3ML) 0.083% nebulizer solution Take 2.5 mg by nebulization every 4 (four) hours. Or Four times daily    . ALPRAZolam (XANAX) 1 MG tablet Take 1 mg by mouth 4 (four) times daily as needed. For anxiety and/or sleep    . amLODipine (NORVASC) 2.5 MG tablet Take 2.5 mg by mouth daily.  11  . aspirin EC 81 MG EC tablet Take 1 tablet (81 mg total) by mouth daily.    . clindamycin (CLEOCIN T) 1 % external solution Apply topically 2 (two) times daily. (Patient taking differently: Apply 1 application topically as needed. ) 30 mL 1  . Cyanocobalamin (VITAMIN B-12) 1000 MCG SUBL Place 1 tablet under the tongue daily.    . ferrous sulfate 325 (65 FE) MG tablet Take 325 mg by mouth 3 (three) times daily with meals.    . Fluticasone-Salmeterol (ADVAIR) 250-50 MCG/DOSE AEPB Inhale 1 puff into the lungs every 12 (twelve) hours.    Marland Kitchen guaiFENesin (MUCINEX) 600 MG 12 hr tablet Take 600 mg by mouth daily as needed for cough.     . levothyroxine (SYNTHROID, LEVOTHROID) 100 MCG tablet Take 100 mcg by mouth daily before breakfast.     . loperamide (IMODIUM) 2 MG capsule Take 2 mg  by mouth as needed for diarrhea or loose stools.    Marland Kitchen loratadine (CLARITIN) 10 MG tablet Take 10 mg by mouth daily as needed for allergies.     . mirtazapine (REMERON) 7.5 MG tablet Take 7.5 mg by mouth at bedtime.  12  . NON FORMULARY OXYGEN      24/7    . potassium chloride SA (K-DUR,KLOR-CON) 20 MEQ tablet Take 1 tablet (20 mEq total) by mouth daily. 7 tablet 0  . TARCEVA 150 MG tablet TAKE 1 TABLET BY MOUTH DAILY. TAKE ON AN EMPTY STOMACH 1 HOUR BEFORE MEALS OR 2 HOURS AFTER. 30 tablet 2  . torsemide (DEMADEX) 10 MG tablet Take 10  mg by mouth.    . Vitamin D, Ergocalciferol, (DRISDOL) 50000 UNITS CAPS capsule Take 50,000 Units by mouth every 30 (thirty) days. Reported on 10/09/2015     No current facility-administered medications for this visit.     SURGICAL HISTORY:  Past Surgical History:  Procedure Laterality Date  . ABDOMINAL HYSTERECTOMY    . APPENDECTOMY    . CHEST TUBE INSERTION  03/08/2012   Procedure: CHEST TUBE INSERTION;  Surgeon: Ivin Poot, MD;  Location: [facility];  Service: Thoracic;  Laterality: Right;  . CHOLECYSTECTOMY    . COLONOSCOPY N/A 04/29/2014   Procedure: COLONOSCOPY;  Surgeon: Daneil Dolin, MD;  [facility];  Service: Endoscopy;  Laterality: N/A;  1245pm  . ESOPHAGOGASTRODUODENOSCOPY N/A 04/29/2014   Procedure: ESOPHAGOGASTRODUODENOSCOPY (EGD);  Surgeon: Daneil Dolin, MD;  [facility];  Service: Endoscopy;  Laterality: N/A;  Venia Minks DILATION N/A 04/29/2014   Procedure: Venia Minks DILATION;  Surgeon: Daneil Dolin, MD;  [facility];  Service: Endoscopy;  Laterality: N/A;  . RESECTION OF APICAL BLEB  03/17/2012   Procedure: RESECTION OF APICAL BLEB;  Surgeon: Ivin Poot, MD;  Location: [facility] OR;  Service: Thoracic;  Laterality: Right;  stapling of bleb  . SAVORY DILATION N/A 04/29/2014   Procedure: SAVORY DILATION;  Surgeon: Daneil Dolin, MD;  [facility];  Service: Endoscopy;  Laterality: N/A;  . TEE WITHOUT CARDIOVERSION N/A 11/15/2014   Procedure: TRANSESOPHAGEAL ECHOCARDIOGRAM (TEE);  Surgeon: Sueanne Margarita, MD;  Location: [facility] ENDOSCOPY;  Service: Cardiovascular;  Laterality: N/A;  . TUBAL LIGATION    . VIDEO ASSISTED THORACOSCOPY  03/17/2012   Procedure: VIDEO ASSISTED THORACOSCOPY;  Surgeon: Ivin Poot, MD;  Location[facility] OR;  Service: Thoracic;  Laterality: Right;    REVIEW OF SYSTEMS:  Constitutional: positive for fatigue and weight loss Eyes: negative Ears, nose, mouth, throat, and face: negative Respiratory: positive for  cough, dyspnea on exertion and wheezing Cardiovascular: negative Gastrointestinal: negative Genitourinary:negative Integument/breast: negative Hematologic/lymphatic: negative Musculoskeletal:negative Neurological: negative Behavioral/Psych: negative Endocrine: negative Allergic/Immunologic: negative   PHYSICAL EXAMINATION: General appearance: alert, cooperative, fatigued and no distress Head: Normocephalic, without obvious abnormality, atraumatic Neck: no adenopathy, no JVD, supple, symmetrical, trachea midline and thyroid not enlarged, symmetric, no tenderness/mass/nodules Lymph nodes: Cervical, supraclavicular, and axillary nodes normal. Resp: clear to auscultation bilaterally Back: symmetric, no curvature. ROM normal. No CVA tenderness. Cardio: regular rate and rhythm, S1, S2 normal, no murmur, click, rub or gallop GI: soft, non-tender; bowel sounds normal; no masses,  no organomegaly Extremities: edema 1+ Neurologic: Alert and oriented X 3, normal strength and tone. Normal symmetric reflexes. Normal coordination and gait  ECOG PERFORMANCE STATUS: 2 - Symptomatic, <50% confined to bed  Blood pressure (!) 175/78, pulse 87, temperature 98.1 F (36.7  C), temperature source Oral, resp. rate 18, height 5' 4"  (1.626 m), weight 101 lb 14.4 oz (46.2 kg), SpO2 100 %. Repeat blood pressure was 152/54  LABORATORY DATA: Lab Results  Component Value Date   WBC 4.1 02/11/2016   HGB 8.3 (L) 02/11/2016   HCT 27.3 (L) 02/11/2016   MCV 85.0 02/11/2016   PLT 286 02/11/2016      Chemistry      Component Value Date/Time   NA 144 02/11/2016 1052   K 3.5 02/11/2016 1052   CL 103 11/13/2014 1744   CO2 24 02/11/2016 1052   BUN 15.3 02/11/2016 1052   CREATININE 0.8 02/11/2016 1052      Component Value Date/Time   CALCIUM 8.5 02/11/2016 1052   ALKPHOS 190 (H) 02/11/2016 1052   AST 13 02/11/2016 1052   ALT 11 02/11/2016 1052   BILITOT 0.51 02/11/2016 1052       RADIOGRAPHIC  STUDIES: Ct Chest W Contrast  Result Date: 02/11/2016 CLINICAL DATA:  Right lung cancer, previous radiation therapy, ongoing oral chemotherapy. EXAM: CT CHEST, ABDOMEN, AND PELVIS WITH CONTRAST TECHNIQUE: Multidetector CT imaging of the chest, abdomen and pelvis was performed following the standard protocol during bolus administration of intravenous contrast. CONTRAST:  75m ISOVUE-300 IOPAMIDOL (ISOVUE-300) INJECTION 61% COMPARISON:  11/07/2015 and 11/18/2014. FINDINGS: CT CHEST FINDINGS Cardiovascular: Atherosclerotic calcification of the arterial vasculature, including coronary arteries. Heart size normal. No pericardial effusion. Pulmonary arteries are enlarged. Mediastinum/Nodes: Mediastinal lymph nodes measure up to 11 mm in the low right paratracheal station, as before. There may be right infrahilar lymphoid tissue. No axillary adenopathy. Esophagus is grossly unremarkable. Lungs/Pleura: Moderate centrilobular emphysema with bullous changes at the right apex. Postoperative changes in the posterior right hemi thorax with associated post treatment collapse/consolidation bronchiectasis, as before. Right middle lobe nodule measures 10 mm (series 4, image 61), previously 8 mm. Cavitary nodule in the lateral segment right middle lobe measures 1.3 x 1.5 cm (image 78), previously 1.3 x 1.4 cm. Cavitary nodule in the anterior right lower lobe measures 13 mm (image 93), previously 8 mm. Subpleural nodular opacification in the right lower lobe appears more prominent as well, some of which is cavitary. Similarly, cavitary and solid nodules in the left lung have enlarged as well. No pleural fluid. Airway is unremarkable. Musculoskeletal: No worrisome lytic or sclerotic lesions. CT ABDOMEN PELVIS FINDINGS Hepatobiliary: Liver measures 18 cm. Intrahepatic and extrahepatic biliary ductal prominence is unchanged and likely related to cholecystectomy. Pancreas: There are calcifications throughout an atrophic pancreas.  Pancreatic duct is mildly dilated, as before. Spleen: Negative. Adrenals/Urinary Tract: Adrenal glands are unremarkable. Stones are seen in the kidneys. Low-attenuation lesions in the kidneys measure up to 1.2 cm on the left and are too small to definitively characterize but statistically, cysts are most likely. Ureters are decompressed. Bladder is grossly unremarkable. Stomach/Bowel: Stomach, small bowel and colon are unremarkable. Appendix is not readily visualized. Vascular/Lymphatic: Atherosclerotic calcification of the arterial vasculature. Infrarenal aorta measures 4.9 cm, increased from 4.2 cm on 11/18/2014. No pathologically enlarged lymph nodes. Reproductive: Uterus appears to be absent.  No adnexal mass. Other: Name free fluid. Mesenteries and peritoneum are unremarkable. Musculoskeletal: Lucency in L3 is unchanged. IMPRESSION: 1. Progressive pulmonary metastatic disease. 2. Lucency in L3 is unchanged. 3. Aortic atherosclerosis (ICD10-170.0). Coronary artery calcification. 4. **An incidental finding of potential clinical significance has been found. Enlarging infrarenal aortic aneurysm. Greater than 5 mm growth over the past 12 months is associated with an increased risk of aneurysm rupture.  Recommend vascular surgery referral/consultation if not already obtained. ** 5. Enlarged pulmonary arteries, indicative of pulmonary arterial hypertension. 6. Chronic calcific pancreatitis. 7.  Emphysema (ICD10-J43.9). 8. Bilateral renal stones. Electronically Signed   By: Lorin Picket M.D.   On: 02/11/2016 14:53   Ct Abdomen Pelvis W Contrast  Result Date: 02/11/2016 CLINICAL DATA:  Right lung cancer, previous radiation therapy, ongoing oral chemotherapy. EXAM: CT CHEST, ABDOMEN, AND PELVIS WITH CONTRAST TECHNIQUE: Multidetector CT imaging of the chest, abdomen and pelvis was performed following the standard protocol during bolus administration of intravenous contrast. CONTRAST:  39m ISOVUE-300 IOPAMIDOL  (ISOVUE-300) INJECTION 61% COMPARISON:  11/07/2015 and 11/18/2014. FINDINGS: CT CHEST FINDINGS Cardiovascular: Atherosclerotic calcification of the arterial vasculature, including coronary arteries. Heart size normal. No pericardial effusion. Pulmonary arteries are enlarged. Mediastinum/Nodes: Mediastinal lymph nodes measure up to 11 mm in the low right paratracheal station, as before. There may be right infrahilar lymphoid tissue. No axillary adenopathy. Esophagus is grossly unremarkable. Lungs/Pleura: Moderate centrilobular emphysema with bullous changes at the right apex. Postoperative changes in the posterior right hemi thorax with associated post treatment collapse/consolidation bronchiectasis, as before. Right middle lobe nodule measures 10 mm (series 4, image 61), previously 8 mm. Cavitary nodule in the lateral segment right middle lobe measures 1.3 x 1.5 cm (image 78), previously 1.3 x 1.4 cm. Cavitary nodule in the anterior right lower lobe measures 13 mm (image 93), previously 8 mm. Subpleural nodular opacification in the right lower lobe appears more prominent as well, some of which is cavitary. Similarly, cavitary and solid nodules in the left lung have enlarged as well. No pleural fluid. Airway is unremarkable. Musculoskeletal: No worrisome lytic or sclerotic lesions. CT ABDOMEN PELVIS FINDINGS Hepatobiliary: Liver measures 18 cm. Intrahepatic and extrahepatic biliary ductal prominence is unchanged and likely related to cholecystectomy. Pancreas: There are calcifications throughout an atrophic pancreas. Pancreatic duct is mildly dilated, as before. Spleen: Negative. Adrenals/Urinary Tract: Adrenal glands are unremarkable. Stones are seen in the kidneys. Low-attenuation lesions in the kidneys measure up to 1.2 cm on the left and are too small to definitively characterize but statistically, cysts are most likely. Ureters are decompressed. Bladder is grossly unremarkable. Stomach/Bowel: Stomach, small  bowel and colon are unremarkable. Appendix is not readily visualized. Vascular/Lymphatic: Atherosclerotic calcification of the arterial vasculature. Infrarenal aorta measures 4.9 cm, increased from 4.2 cm on 11/18/2014. No pathologically enlarged lymph nodes. Reproductive: Uterus appears to be absent.  No adnexal mass. Other: Name free fluid. Mesenteries and peritoneum are unremarkable. Musculoskeletal: Lucency in L3 is unchanged. IMPRESSION: 1. Progressive pulmonary metastatic disease. 2. Lucency in L3 is unchanged. 3. Aortic atherosclerosis (ICD10-170.0). Coronary artery calcification. 4. **An incidental finding of potential clinical significance has been found. Enlarging infrarenal aortic aneurysm. Greater than 5 mm growth over the past 12 months is associated with an increased risk of aneurysm rupture. Recommend vascular surgery referral/consultation if not already obtained. ** 5. Enlarged pulmonary arteries, indicative of pulmonary arterial hypertension. 6. Chronic calcific pancreatitis. 7.  Emphysema (ICD10-J43.9). 8. Bilateral renal stones. Electronically Signed   By: MLorin PicketM.D.   On: 02/11/2016 14:53    ASSESSMENT AND PLAN: This is a very pleasant 75years old with metastatic non-small cell lung cancer, adenocarcinoma of the right upper lobe with positive EGFR mutation who is currently undergoing treatment with Tarceva 150 mg by mouth daily status post 19 months of treatment. The patient is tolerating her treatment fairly well with no significant adverse effects except for mild skin rash and few episodes  of diarrhea. The recent CT scan of the chest, abdomen and pelvis showed mild disease progression but in the absence of other good treatment options for this patient especially with her current performance status and poor pulmonary function, I recommended for the patient to continue her current treatment with Tarceva. We will accept the mild disease progression since the patient is not  symptomatically worse than her baseline. For the anemia, I advised the patient to continue on oral iron tablets with vitamin C for now. For the hypertension and edema of the lower extremities, the patient will continue on Norvasc and torsemide for now. I recommended for her to contact her primary care physician for recommendation and adjustment of her medications. I will see the patient back for follow-up visit in one month for reevaluation with repeat blood work.  For the abdominal aortic aneurysm, the patient was advised with the changes of the size of the aneurysm on the current scan and she will contact her vascular surgeon for evaluation. She was advised to call immediately if she has any concerning symptoms in the interval. The patient voices understanding of current disease status and treatment options and is in agreement with the current care plan.  All questions were answered. The patient knows to call the clinic with any problems, questions or concerns. We can certainly see the patient much sooner if necessary.  Disclaimer: This note was dictated with voice recognition software. Similar sounding words can inadvertently be transcribed and may not be corrected upon review.

## 2016-02-18 NOTE — Telephone Encounter (Signed)
Lab and follow up appointment was scheduled per 02/18/16 los. A copy of the AVS report and appointment schedule was given to patient, per 02/18/16 los.

## 2016-02-20 NOTE — Telephone Encounter (Signed)
Oral Chemotherapy Pharmacist Encounter  Received call from [employer] that they had received patient's application for medication assistance and her case will be transferred over to their foundation to be considered for free medication. We will find out at a later time about determination.  [employer] Pt ID: [id number]  Johny Drilling, PharmD, BCPS 02/20/2016  3:51 PM Oral Chemotherapy Clinic [phone]

## 2016-02-23 DIAGNOSIS — J449 Chronic obstructive pulmonary disease, unspecified: Secondary | ICD-10-CM | POA: Diagnosis not present

## 2016-02-23 DIAGNOSIS — J439 Emphysema, unspecified: Secondary | ICD-10-CM | POA: Diagnosis not present

## 2016-02-27 NOTE — Telephone Encounter (Signed)
Oral Chemotherapy Pharmacist Encounter  Received notification from [facility]) that patient has been approved for free Tarceva from the manufacturer for the next 12 months. [employer] will contact patient for medication delivery issues. Patient is aware and appreciative of assistance from [facility].  We will sign off at this time, please let us know if we can be of further assistance in the future.  Johny Drilling, PharmD, BCPS 02/27/2016  11:58 AM Oral Chemotherapy Clinic [phone]

## 2016-03-08 DIAGNOSIS — J9611 Chronic respiratory failure with hypoxia: Secondary | ICD-10-CM | POA: Diagnosis not present

## 2016-03-08 DIAGNOSIS — C3490 Malignant neoplasm of unspecified part of unspecified bronchus or lung: Secondary | ICD-10-CM | POA: Diagnosis not present

## 2016-03-08 DIAGNOSIS — J449 Chronic obstructive pulmonary disease, unspecified: Secondary | ICD-10-CM | POA: Diagnosis not present

## 2016-03-08 DIAGNOSIS — I10 Essential (primary) hypertension: Secondary | ICD-10-CM | POA: Diagnosis not present

## 2016-03-09 DIAGNOSIS — I2781 Cor pulmonale (chronic): Secondary | ICD-10-CM | POA: Diagnosis not present

## 2016-03-09 DIAGNOSIS — F419 Anxiety disorder, unspecified: Secondary | ICD-10-CM | POA: Diagnosis not present

## 2016-03-09 DIAGNOSIS — J449 Chronic obstructive pulmonary disease, unspecified: Secondary | ICD-10-CM | POA: Diagnosis not present

## 2016-03-09 DIAGNOSIS — G894 Chronic pain syndrome: Secondary | ICD-10-CM | POA: Diagnosis not present

## 2016-03-09 DIAGNOSIS — Z681 Body mass index (BMI) 19 or less, adult: Secondary | ICD-10-CM | POA: Diagnosis not present

## 2016-03-09 DIAGNOSIS — C3490 Malignant neoplasm of unspecified part of unspecified bronchus or lung: Secondary | ICD-10-CM | POA: Diagnosis not present

## 2016-03-09 DIAGNOSIS — R600 Localized edema: Secondary | ICD-10-CM | POA: Diagnosis not present

## 2016-03-09 DIAGNOSIS — Z1389 Encounter for screening for other disorder: Secondary | ICD-10-CM | POA: Diagnosis not present

## 2016-03-09 DIAGNOSIS — I63531 Cerebral infarction due to unspecified occlusion or stenosis of right posterior cerebral artery: Secondary | ICD-10-CM | POA: Diagnosis not present

## 2016-03-09 DIAGNOSIS — J439 Emphysema, unspecified: Secondary | ICD-10-CM | POA: Diagnosis not present

## 2016-03-09 DIAGNOSIS — J9610 Chronic respiratory failure, unspecified whether with hypoxia or hypercapnia: Secondary | ICD-10-CM | POA: Diagnosis not present

## 2016-03-10 DIAGNOSIS — J449 Chronic obstructive pulmonary disease, unspecified: Secondary | ICD-10-CM | POA: Diagnosis not present

## 2016-03-10 DIAGNOSIS — J9610 Chronic respiratory failure, unspecified whether with hypoxia or hypercapnia: Secondary | ICD-10-CM | POA: Diagnosis not present

## 2016-03-18 ENCOUNTER — Ambulatory Visit: Payer: PPO | Admitting: Internal Medicine

## 2016-03-18 ENCOUNTER — Other Ambulatory Visit: Payer: PPO

## 2016-03-24 DIAGNOSIS — R809 Proteinuria, unspecified: Secondary | ICD-10-CM | POA: Diagnosis not present

## 2016-03-24 DIAGNOSIS — I10 Essential (primary) hypertension: Secondary | ICD-10-CM | POA: Diagnosis not present

## 2016-03-24 DIAGNOSIS — E876 Hypokalemia: Secondary | ICD-10-CM | POA: Diagnosis not present

## 2016-03-24 DIAGNOSIS — D649 Anemia, unspecified: Secondary | ICD-10-CM | POA: Diagnosis not present

## 2016-03-24 DIAGNOSIS — J439 Emphysema, unspecified: Secondary | ICD-10-CM | POA: Diagnosis not present

## 2016-03-24 DIAGNOSIS — J449 Chronic obstructive pulmonary disease, unspecified: Secondary | ICD-10-CM | POA: Diagnosis not present

## 2016-04-07 ENCOUNTER — Telehealth: Payer: Self-pay | Admitting: Internal Medicine

## 2016-04-07 ENCOUNTER — Encounter: Payer: Self-pay | Admitting: Internal Medicine

## 2016-04-07 ENCOUNTER — Other Ambulatory Visit (HOSPITAL_BASED_OUTPATIENT_CLINIC_OR_DEPARTMENT_OTHER): Payer: PPO

## 2016-04-07 ENCOUNTER — Ambulatory Visit (HOSPITAL_BASED_OUTPATIENT_CLINIC_OR_DEPARTMENT_OTHER): Payer: PPO | Admitting: Internal Medicine

## 2016-04-07 VITALS — BP 133/63 | HR 104 | Temp 97.9°F | Resp 19 | Wt 100.8 lb

## 2016-04-07 DIAGNOSIS — I10 Essential (primary) hypertension: Secondary | ICD-10-CM | POA: Diagnosis not present

## 2016-04-07 DIAGNOSIS — J449 Chronic obstructive pulmonary disease, unspecified: Secondary | ICD-10-CM

## 2016-04-07 DIAGNOSIS — C3411 Malignant neoplasm of upper lobe, right bronchus or lung: Secondary | ICD-10-CM | POA: Diagnosis not present

## 2016-04-07 DIAGNOSIS — M7989 Other specified soft tissue disorders: Secondary | ICD-10-CM

## 2016-04-07 DIAGNOSIS — Z5111 Encounter for antineoplastic chemotherapy: Secondary | ICD-10-CM

## 2016-04-07 DIAGNOSIS — D630 Anemia in neoplastic disease: Secondary | ICD-10-CM

## 2016-04-07 DIAGNOSIS — I714 Abdominal aortic aneurysm, without rupture, unspecified: Secondary | ICD-10-CM

## 2016-04-07 DIAGNOSIS — E876 Hypokalemia: Secondary | ICD-10-CM | POA: Diagnosis not present

## 2016-04-07 DIAGNOSIS — D638 Anemia in other chronic diseases classified elsewhere: Secondary | ICD-10-CM

## 2016-04-07 LAB — CBC WITH DIFFERENTIAL/PLATELET
BASO%: 0.9 % (ref 0.0–2.0)
BASOS ABS: 0 10*3/uL (ref 0.0–0.1)
EOS%: 5.7 % (ref 0.0–7.0)
Eosinophils Absolute: 0.2 10*3/uL (ref 0.0–0.5)
HCT: 25.7 % — ABNORMAL LOW (ref 34.8–46.6)
HGB: 7.9 g/dL — ABNORMAL LOW (ref 11.6–15.9)
LYMPH%: 23.9 % (ref 14.0–49.7)
MCH: 24.6 pg — ABNORMAL LOW (ref 25.1–34.0)
MCHC: 30.7 g/dL — AB (ref 31.5–36.0)
MCV: 80.4 fL (ref 79.5–101.0)
MONO#: 0.4 10*3/uL (ref 0.1–0.9)
MONO%: 11.9 % (ref 0.0–14.0)
NEUT#: 2.1 10*3/uL (ref 1.5–6.5)
NEUT%: 57.6 % (ref 38.4–76.8)
Platelets: 294 10*3/uL (ref 145–400)
RBC: 3.2 10*6/uL — ABNORMAL LOW (ref 3.70–5.45)
RDW: 18.7 % — ABNORMAL HIGH (ref 11.2–14.5)
WBC: 3.6 10*3/uL — ABNORMAL LOW (ref 3.9–10.3)
lymph#: 0.9 10*3/uL (ref 0.9–3.3)

## 2016-04-07 LAB — COMPREHENSIVE METABOLIC PANEL
ALT: 12 U/L (ref 0–55)
AST: 17 U/L (ref 5–34)
Albumin: 2.4 g/dL — ABNORMAL LOW (ref 3.5–5.0)
Alkaline Phosphatase: 245 U/L — ABNORMAL HIGH (ref 40–150)
Anion Gap: 10 mEq/L (ref 3–11)
BUN: 19.4 mg/dL (ref 7.0–26.0)
CALCIUM: 7.3 mg/dL — AB (ref 8.4–10.4)
CHLORIDE: 107 meq/L (ref 98–109)
CO2: 27 mEq/L (ref 22–29)
Creatinine: 0.7 mg/dL (ref 0.6–1.1)
EGFR: 80 mL/min/{1.73_m2} — AB (ref >90)
GLUCOSE: 89 mg/dL (ref 70–140)
POTASSIUM: 3.1 meq/L — AB (ref 3.5–5.1)
SODIUM: 144 meq/L (ref 136–145)
Total Bilirubin: 0.42 mg/dL (ref 0.20–1.20)
Total Protein: 4.9 g/dL — ABNORMAL LOW (ref 6.4–8.3)

## 2016-04-07 NOTE — Progress Notes (Signed)
[facility] [phone]   [phone]  OFFICE PROGRESS NOTE  Glo Herring., MD [place] [address]  DIAGNOSIS: Recurrent non-small cell lung cancer, adenocarcinoma with positive EGFR mutation in exon 21 initially diagnosed in December 2013.  PRIOR THERAPY: Palliative radiotherapy to the enlarging right upper lobe lung mass under the care of Dr. Pablo Ledger completed on 01/06/2015.  CURRENT THERAPY:  1) Tarceva 150 mg by mouth daily status post 20 months of treatment.   INTERVAL HISTORY: [name] 76 y.o. female came to the clinic today for follow-up visit accompanied by her daughter. The patient continues to complain of fatigue and weakness as well as pain in the right scapular area. She takes Tylenol 2 tablets at nighttime and 1 tablet in the morning. She does not take any narcotic medications. She is also complaining of shortness of breath with exertion. She has few episodes of diarrhea improved with Imodium. She denied having any nausea or vomiting. She has no chest pain but continues to have shortness breath with exertion with no cough or hemoptysis. She denied having any significant weight loss or night sweats. She denied having any bleeding issues. She is tolerating her treatment with Tarceva fairly well. She is here today for evaluation and repeat blood work.  MEDICAL HISTORY: Past Medical History:  Diagnosis Date  . AAA (abdominal aortic aneurysm) ([facility])   . Anemia of chronic disease 11/14/2014  . Chronic kidney disease   . COPD (chronic obstructive pulmonary disease) ([facility])   . Hypertension   . Lung cancer ([place])   . Pernicious anemia   . Primary cancer of right upper lobe of lung ([facility]) 05/10/2012  . Stroke ([facility])   . Thyroid disease     ALLERGIES:  is allergic to codeine; penicillins; and pravastatin sodium.  MEDICATIONS:  Current Outpatient Prescriptions  Medication Sig Dispense Refill  . acetaminophen  (TYLENOL) 500 MG tablet Take 500-1,000 mg by mouth every 6 (six) hours as needed for moderate pain.     Marland Kitchen albuterol (PROVENTIL) (2.5 MG/3ML) 0.083% nebulizer solution Take 2.5 mg by nebulization every 4 (four) hours. Or Four times daily    . ALPRAZolam (XANAX) 1 MG tablet Take 1 mg by mouth 4 (four) times daily as needed. For anxiety and/or sleep    . amLODipine (NORVASC) 2.5 MG tablet Take 2.5 mg by mouth daily.  11  . aspirin EC 81 MG EC tablet Take 1 tablet (81 mg total) by mouth daily.    . clindamycin (CLEOCIN T) 1 % external solution Apply topically 2 (two) times daily. (Patient taking differently: Apply 1 application topically as needed. ) 30 mL 1  . Cyanocobalamin (VITAMIN B-12) 1000 MCG SUBL Place 1 tablet under the tongue daily.    . ferrous sulfate 325 (65 FE) MG tablet Take 325 mg by mouth 3 (three) times daily with meals.    . Fluticasone-Salmeterol (ADVAIR) 250-50 MCG/DOSE AEPB Inhale 1 puff into the lungs every 12 (twelve) hours.    Marland Kitchen guaiFENesin (MUCINEX) 600 MG 12 hr tablet Take 600 mg by mouth daily as needed for cough.     . levothyroxine (SYNTHROID, LEVOTHROID) 100 MCG tablet Take 100 mcg by mouth daily before breakfast.     . loperamide (IMODIUM) 2 MG capsule Take 2 mg by mouth as needed for diarrhea or loose stools.    Marland Kitchen loratadine (CLARITIN) 10 MG tablet Take 10 mg by mouth daily as needed for allergies.     Marland Kitchen  mirtazapine (REMERON) 7.5 MG tablet Take 7.5 mg by mouth at bedtime.  12  . NON FORMULARY OXYGEN      24/7    . potassium chloride SA (K-DUR,KLOR-CON) 20 MEQ tablet Take 1 tablet (20 mEq total) by mouth daily. 7 tablet 0  . TARCEVA 150 MG tablet TAKE 1 TABLET BY MOUTH DAILY. TAKE ON AN EMPTY STOMACH 1 HOUR BEFORE MEALS OR 2 HOURS AFTER. 30 tablet 2  . torsemide (DEMADEX) 10 MG tablet Take 10 mg by mouth.    . Vitamin D, Ergocalciferol, (DRISDOL) 50000 UNITS CAPS capsule Take 50,000 Units by mouth every 30 (thirty) days. Reported on 10/09/2015     No current  facility-administered medications for this visit.     SURGICAL HISTORY:  Past Surgical History:  Procedure Laterality Date  . ABDOMINAL HYSTERECTOMY    . APPENDECTOMY    . CHEST TUBE INSERTION  03/08/2012   Procedure: CHEST TUBE INSERTION;  Surgeon: Ivin Poot, MD;  Location: [facility];  Service: Thoracic;  Laterality: Right;  . CHOLECYSTECTOMY    . COLONOSCOPY N/A 04/29/2014   Procedure: COLONOSCOPY;  Surgeon: Daneil Dolin, MD;  [facility];  Service: Endoscopy;  Laterality: N/A;  1245pm  . ESOPHAGOGASTRODUODENOSCOPY N/A 04/29/2014   Procedure: ESOPHAGOGASTRODUODENOSCOPY (EGD);  Surgeon: Daneil Dolin, MD;  [facility];  Service: Endoscopy;  Laterality: N/A;  Venia Minks DILATION N/A 04/29/2014   Procedure: Venia Minks DILATION;  Surgeon: Daneil Dolin, MD;  [facility];  Service: Endoscopy;  Laterality: N/A;  . RESECTION OF APICAL BLEB  03/17/2012   Procedure: RESECTION OF APICAL BLEB;  Surgeon: Ivin Poot, MD;  Location: [facility] OR;  Service: Thoracic;  Laterality: Right;  stapling of bleb  . SAVORY DILATION N/A 04/29/2014   Procedure: SAVORY DILATION;  Surgeon: Daneil Dolin, MD;  [facility];  Service: Endoscopy;  Laterality: N/A;  . TEE WITHOUT CARDIOVERSION N/A 11/15/2014   Procedure: TRANSESOPHAGEAL ECHOCARDIOGRAM (TEE);  Surgeon: Sueanne Margarita, MD;  Location: [facility] ENDOSCOPY;  Service: Cardiovascular;  Laterality: N/A;  . TUBAL LIGATION    . VIDEO ASSISTED THORACOSCOPY  03/17/2012   Procedure: VIDEO ASSISTED THORACOSCOPY;  Surgeon: Ivin Poot, MD;  Location: [facility] OR;  Service: Thoracic;  Laterality: Right;    REVIEW OF SYSTEMS:  Constitutional: positive for fatigue Eyes: negative Ears, nose, mouth, throat, and face: negative Respiratory: positive for dyspnea on exertion Cardiovascular: negative Gastrointestinal: negative Genitourinary:negative Integument/breast: negative Hematologic/lymphatic: negative Musculoskeletal:positive  for bone pain Neurological: negative Behavioral/Psych: negative Endocrine: negative Allergic/Immunologic: negative   PHYSICAL EXAMINATION: General appearance: alert, cooperative, fatigued and no distress Head: Normocephalic, without obvious abnormality, atraumatic Neck: no adenopathy, no JVD, supple, symmetrical, trachea midline and thyroid not enlarged, symmetric, no tenderness/mass/nodules Lymph nodes: Cervical, supraclavicular, and axillary nodes normal. Resp: clear to auscultation bilaterally Back: symmetric, no curvature. ROM normal. No CVA tenderness. Cardio: regular rate and rhythm, S1, S2 normal, no murmur, click, rub or gallop GI: soft, non-tender; bowel sounds normal; no masses,  no organomegaly Extremities: extremities normal, atraumatic, no cyanosis or edema Neurologic: Alert and oriented X 3, normal strength and tone. Normal symmetric reflexes. Normal coordination and gait  ECOG PERFORMANCE STATUS: 1 - Symptomatic but completely ambulatory  Blood pressure 133/63, pulse (!) 104, temperature 97.9 F (36.6 C), temperature source Oral, resp. rate 19, weight 100 lb 12.8 oz (45.7 kg), SpO2 100 %. Repeat blood pressure was 152/54  LABORATORY DATA: Lab Results  Component Value Date  WBC 3.6 (L) 04/07/2016   HGB 7.9 (L) 04/07/2016   HCT 25.7 (L) 04/07/2016   MCV 80.4 04/07/2016   PLT 294 04/07/2016      Chemistry      Component Value Date/Time   NA 144 04/07/2016 1027   K 3.1 (L) 04/07/2016 1027   CL 103 11/13/2014 1744   CO2 27 04/07/2016 1027   BUN 19.4 04/07/2016 1027   CREATININE 0.7 04/07/2016 1027      Component Value Date/Time   CALCIUM 7.3 (L) 04/07/2016 1027   ALKPHOS 245 (H) 04/07/2016 1027   AST 17 04/07/2016 1027   ALT 12 04/07/2016 1027   BILITOT 0.42 04/07/2016 1027       RADIOGRAPHIC STUDIES: No results found.  ASSESSMENT AND PLAN: This is a very pleasant 76 years old white female with: 1) recurrent non-small cell lung cancer, adenocarcinoma  with positive EGFR mutation in exon 21 ([id number]). She is currently undergoing treatment with Tarceva 150 mg by mouth daily status post 20 months of treatment. The patient is tolerating her treatment well except for fatigue and few episodes of diarrhea. I recommended for her to continue her treatment with Tarceva with the same dose. I will see her back for follow-up visit in one month's for reevaluation with repeat CT scan of the chest, abdomen and pelvis for restaging of her disease. 2) hypokalemia: I recommended for the patient to start treatment with potassium chloride 20 mEq by mouth daily for the next 10 days. 3) anemia of neoplastic disease.: I will arrange for the patient to receive 2 units of PRBCs transfusion in the next few days. 4) hypertension: Her blood pressure is better controlled today. She will continue her current treatment with Norvasc and to a sigmoid. The patient was advised to call immediately if she has any concerning symptoms in the interval. She was advised to call immediately if she has any concerning symptoms in the interval. The patient voices understanding of current disease status and treatment options and is in agreement with the current care plan.  All questions were answered. The patient knows to call the clinic with any problems, questions or concerns. We can certainly see the patient much sooner if necessary.  Disclaimer: This note was dictated with voice recognition software. Similar sounding words can inadvertently be transcribed and may not be corrected upon review.

## 2016-04-07 NOTE — Telephone Encounter (Signed)
sw pt to confirm 1/4 appt date/time per LOS

## 2016-04-07 NOTE — Telephone Encounter (Signed)
2 bottles of contrast given to patient, per CT ordered. Appointments scheduled per 04/07/16 los.  Patient was given a copy of the AVS report and appointment schedule, per 04/07/16 los.

## 2016-04-08 ENCOUNTER — Ambulatory Visit (HOSPITAL_COMMUNITY)
Admission: RE | Admit: 2016-04-08 | Discharge: 2016-04-08 | Disposition: A | Discharge status: Home / Self Care | Payer: PPO | Source: Ambulatory Visit | Attending: Internal Medicine | Admitting: Internal Medicine

## 2016-04-08 ENCOUNTER — Other Ambulatory Visit: Payer: PPO

## 2016-04-08 ENCOUNTER — Other Ambulatory Visit: Payer: Self-pay | Admitting: Medical Oncology

## 2016-04-08 DIAGNOSIS — C3411 Malignant neoplasm of upper lobe, right bronchus or lung: Secondary | ICD-10-CM

## 2016-04-08 DIAGNOSIS — Z5111 Encounter for antineoplastic chemotherapy: Secondary | ICD-10-CM

## 2016-04-08 DIAGNOSIS — D649 Anemia, unspecified: Secondary | ICD-10-CM

## 2016-04-08 DIAGNOSIS — E876 Hypokalemia: Secondary | ICD-10-CM

## 2016-04-08 DIAGNOSIS — I10 Essential (primary) hypertension: Secondary | ICD-10-CM

## 2016-04-08 DIAGNOSIS — D638 Anemia in other chronic diseases classified elsewhere: Secondary | ICD-10-CM

## 2016-04-08 LAB — PREPARE RBC (CROSSMATCH)

## 2016-04-08 MED ORDER — DIPHENHYDRAMINE HCL 25 MG PO CAPS
25.0000 mg | ORAL_CAPSULE | Freq: Once | ORAL | Status: AC
  Administered 2016-04-08: 25 mg via ORAL
  Filled 2016-04-08: qty 1

## 2016-04-08 MED ORDER — SODIUM CHLORIDE 0.9 % IV SOLN
250.0000 mL | Freq: Once | INTRAVENOUS | Status: AC
  Administered 2016-04-08: 250 mL via INTRAVENOUS

## 2016-04-08 MED ORDER — ACETAMINOPHEN 325 MG PO TABS
650.0000 mg | ORAL_TABLET | Freq: Once | ORAL | Status: AC
  Administered 2016-04-08: 650 mg via ORAL
  Filled 2016-04-08: qty 2

## 2016-04-08 NOTE — Progress Notes (Signed)
Diagnosis Association: Symptomatic anemia (D64.9)  Provider: Mayme Genta  Procedure: Patient received 2 units of PRBCs via piv.  Patient tolerated well.  Post procedure: Patient alert, oriented and ambulatory at discharge.

## 2016-04-09 LAB — TYPE AND SCREEN
ABO/RH(D): A NEG
ANTIBODY SCREEN: NEGATIVE
Unit division: 0
Unit division: 0

## 2016-04-10 DIAGNOSIS — J449 Chronic obstructive pulmonary disease, unspecified: Secondary | ICD-10-CM | POA: Diagnosis not present

## 2016-04-10 DIAGNOSIS — J9610 Chronic respiratory failure, unspecified whether with hypoxia or hypercapnia: Secondary | ICD-10-CM | POA: Diagnosis not present

## 2016-04-24 DIAGNOSIS — J449 Chronic obstructive pulmonary disease, unspecified: Secondary | ICD-10-CM | POA: Diagnosis not present

## 2016-04-24 DIAGNOSIS — J439 Emphysema, unspecified: Secondary | ICD-10-CM | POA: Diagnosis not present

## 2016-04-30 ENCOUNTER — Other Ambulatory Visit: Payer: Self-pay | Admitting: *Deleted

## 2016-04-30 DIAGNOSIS — C3411 Malignant neoplasm of upper lobe, right bronchus or lung: Secondary | ICD-10-CM

## 2016-05-03 ENCOUNTER — Ambulatory Visit (HOSPITAL_COMMUNITY): Payer: PPO

## 2016-05-03 ENCOUNTER — Other Ambulatory Visit: Payer: PPO

## 2016-05-03 ENCOUNTER — Encounter: Payer: Self-pay | Admitting: Surgery

## 2016-05-05 ENCOUNTER — Ambulatory Visit: Payer: PPO | Admitting: Internal Medicine

## 2016-05-07 ENCOUNTER — Telehealth: Payer: PPO | Admitting: Nurse Practitioner

## 2016-05-07 DIAGNOSIS — R5081 Fever presenting with conditions classified elsewhere: Secondary | ICD-10-CM

## 2016-05-07 NOTE — Progress Notes (Signed)
Based on what you shared with me it looks like you have a serious condition that should be evaluated in a face to face office visit.  NOTE: Even if you have entered your credit card information for this eVisit, you will not be charged.   If you are having a true medical emergency please call 911.  If you need an urgent face to face visit, [facility] has four urgent care centers for your convenience.  If you need care fast and have a high deductible or no insurance consider:   [email or web address]  [phone]  [address], [address] [place], [place] [address] 8 am to 8 pm Monday-Friday 10 am to 4 pm Saturday-Sunday   The following sites will take your  insurance:    . [facility]  [phone] Get Driving Directions Find a Provider at this Location  [address] [place], [place] [address] . 10 am to 8 pm Monday-Friday . 12 pm to 8 pm Saturday-Sunday   . [facility]  [phone] Get Driving Directions Find a Provider at this Location  [address], [address] [place], [place] [address] . 8 am to 8 pm Monday-Friday . 9 am to 6 pm Saturday . 11 am to 6 pm Sunday   . [facility] [place]  [phone] Get Driving Directions  [address] [place], [place] [address] . 8 am to 8 pm Monday-Friday . 8 am to 4 pm Saturday-Sunday   Your e-visit answers were reviewed by a board certified advanced clinical practitioner to complete your personal care plan.  Thank you for using e-Visits.  

## 2016-05-10 ENCOUNTER — Ambulatory Visit: Payer: PPO | Admitting: Surgery

## 2016-05-11 DIAGNOSIS — J9610 Chronic respiratory failure, unspecified whether with hypoxia or hypercapnia: Secondary | ICD-10-CM | POA: Diagnosis not present

## 2016-05-11 DIAGNOSIS — J449 Chronic obstructive pulmonary disease, unspecified: Secondary | ICD-10-CM | POA: Diagnosis not present

## 2016-05-17 ENCOUNTER — Other Ambulatory Visit (HOSPITAL_BASED_OUTPATIENT_CLINIC_OR_DEPARTMENT_OTHER): Payer: PPO

## 2016-05-17 ENCOUNTER — Ambulatory Visit (HOSPITAL_COMMUNITY)
Admission: RE | Admit: 2016-05-17 | Discharge: 2016-05-17 | Disposition: A | Discharge status: Home / Self Care | Payer: PPO | Source: Ambulatory Visit | Attending: Internal Medicine | Admitting: Internal Medicine

## 2016-05-17 DIAGNOSIS — I10 Essential (primary) hypertension: Secondary | ICD-10-CM | POA: Diagnosis not present

## 2016-05-17 DIAGNOSIS — J439 Emphysema, unspecified: Secondary | ICD-10-CM | POA: Diagnosis not present

## 2016-05-17 DIAGNOSIS — R918 Other nonspecific abnormal finding of lung field: Secondary | ICD-10-CM | POA: Insufficient documentation

## 2016-05-17 DIAGNOSIS — K861 Other chronic pancreatitis: Secondary | ICD-10-CM | POA: Diagnosis not present

## 2016-05-17 DIAGNOSIS — E876 Hypokalemia: Secondary | ICD-10-CM | POA: Diagnosis not present

## 2016-05-17 DIAGNOSIS — C3411 Malignant neoplasm of upper lobe, right bronchus or lung: Secondary | ICD-10-CM | POA: Diagnosis not present

## 2016-05-17 DIAGNOSIS — Z5111 Encounter for antineoplastic chemotherapy: Secondary | ICD-10-CM

## 2016-05-17 DIAGNOSIS — J90 Pleural effusion, not elsewhere classified: Secondary | ICD-10-CM | POA: Insufficient documentation

## 2016-05-17 DIAGNOSIS — N200 Calculus of kidney: Secondary | ICD-10-CM | POA: Diagnosis not present

## 2016-05-17 DIAGNOSIS — I714 Abdominal aortic aneurysm, without rupture: Secondary | ICD-10-CM | POA: Insufficient documentation

## 2016-05-17 DIAGNOSIS — D638 Anemia in other chronic diseases classified elsewhere: Secondary | ICD-10-CM | POA: Diagnosis not present

## 2016-05-17 DIAGNOSIS — C3490 Malignant neoplasm of unspecified part of unspecified bronchus or lung: Secondary | ICD-10-CM | POA: Diagnosis not present

## 2016-05-17 LAB — CBC WITH DIFFERENTIAL/PLATELET
BASO%: 0.4 % (ref 0.0–2.0)
Basophils Absolute: 0 10*3/uL (ref 0.0–0.1)
EOS%: 4.5 % (ref 0.0–7.0)
Eosinophils Absolute: 0.2 10*3/uL (ref 0.0–0.5)
HCT: 29.7 % — ABNORMAL LOW (ref 34.8–46.6)
HGB: 9.2 g/dL — ABNORMAL LOW (ref 11.6–15.9)
LYMPH%: 15.8 % (ref 14.0–49.7)
MCH: 26.5 pg (ref 25.1–34.0)
MCHC: 31 g/dL — ABNORMAL LOW (ref 31.5–36.0)
MCV: 85.6 fL (ref 79.5–101.0)
MONO#: 0.5 10*3/uL (ref 0.1–0.9)
MONO%: 10.3 % (ref 0.0–14.0)
NEUT#: 3.4 10*3/uL (ref 1.5–6.5)
NEUT%: 69 % (ref 38.4–76.8)
Platelets: 406 10*3/uL — ABNORMAL HIGH (ref 145–400)
RBC: 3.47 10*6/uL — ABNORMAL LOW (ref 3.70–5.45)
RDW: 19.9 % — ABNORMAL HIGH (ref 11.2–14.5)
WBC: 4.9 10*3/uL (ref 3.9–10.3)
lymph#: 0.8 10*3/uL — ABNORMAL LOW (ref 0.9–3.3)

## 2016-05-17 LAB — COMPREHENSIVE METABOLIC PANEL
ALT: 10 U/L (ref 0–55)
AST: 15 U/L (ref 5–34)
Albumin: 2 g/dL — ABNORMAL LOW (ref 3.5–5.0)
Alkaline Phosphatase: 222 U/L — ABNORMAL HIGH (ref 40–150)
Anion Gap: 7 mEq/L (ref 3–11)
BUN: 15.1 mg/dL (ref 7.0–26.0)
CALCIUM: 7.6 mg/dL — AB (ref 8.4–10.4)
CHLORIDE: 105 meq/L (ref 98–109)
CO2: 29 meq/L (ref 22–29)
Creatinine: 0.7 mg/dL (ref 0.6–1.1)
EGFR: 79 mL/min/{1.73_m2} — ABNORMAL LOW (ref >90)
GLUCOSE: 91 mg/dL (ref 70–140)
POTASSIUM: 4.1 meq/L (ref 3.5–5.1)
SODIUM: 141 meq/L (ref 136–145)
Total Bilirubin: 0.43 mg/dL (ref 0.20–1.20)
Total Protein: 4.9 g/dL — ABNORMAL LOW (ref 6.4–8.3)

## 2016-05-17 MED ORDER — SODIUM CHLORIDE 0.9 % IJ SOLN
INTRAMUSCULAR | Status: AC
  Filled 2016-05-17: qty 50

## 2016-05-17 MED ORDER — IOPAMIDOL (ISOVUE-300) INJECTION 61%
INTRAVENOUS | Status: AC
  Administered 2016-05-17: 75 mL
  Filled 2016-05-17: qty 75

## 2016-05-18 ENCOUNTER — Encounter: Payer: Self-pay | Admitting: Internal Medicine

## 2016-05-18 ENCOUNTER — Telehealth: Payer: Self-pay | Admitting: Internal Medicine

## 2016-05-18 ENCOUNTER — Ambulatory Visit (HOSPITAL_BASED_OUTPATIENT_CLINIC_OR_DEPARTMENT_OTHER): Payer: PPO | Admitting: Internal Medicine

## 2016-05-18 ENCOUNTER — Encounter: Payer: Self-pay | Admitting: *Deleted

## 2016-05-18 ENCOUNTER — Ambulatory Visit (HOSPITAL_BASED_OUTPATIENT_CLINIC_OR_DEPARTMENT_OTHER): Payer: PPO

## 2016-05-18 VITALS — BP 161/73 | HR 82 | Temp 98.3°F | Resp 18 | Ht 64.0 in | Wt 100.1 lb

## 2016-05-18 DIAGNOSIS — I10 Essential (primary) hypertension: Secondary | ICD-10-CM

## 2016-05-18 DIAGNOSIS — D630 Anemia in neoplastic disease: Secondary | ICD-10-CM | POA: Diagnosis not present

## 2016-05-18 DIAGNOSIS — D638 Anemia in other chronic diseases classified elsewhere: Secondary | ICD-10-CM

## 2016-05-18 DIAGNOSIS — C3411 Malignant neoplasm of upper lobe, right bronchus or lung: Secondary | ICD-10-CM

## 2016-05-18 DIAGNOSIS — Z5111 Encounter for antineoplastic chemotherapy: Secondary | ICD-10-CM

## 2016-05-18 NOTE — Progress Notes (Signed)
[name] [phone]   [phone]  OFFICE PROGRESS NOTE  Glo Herring., MD [place] [address]  DIAGNOSIS: Recurrent non-small cell lung cancer, adenocarcinoma with positive EGFR mutation in exon 21 initially diagnosed in December 2013.  PRIOR THERAPY: Palliative radiotherapy to the enlarging right upper lobe lung mass under the care of Dr. Pablo Ledger completed on 01/06/2015.  CURRENT THERAPY:  1) Tarceva 150 mg by mouth daily status post 21 months of treatment.   INTERVAL HISTORY: [name] 76 y.o. female came to the clinic today for follow-up visit accompanied by her daughter. The patient is feeling a little bit better today. She has 2 weeks of feeling fatigue and tired as well as shortness of breath and mild fever. She also has cough productive of yellowish sputum at that time. She denied having any current chest pain or hemoptysis. She has no significant weight loss or night sweats. She has no current fever or chills. She continues to have swelling of the right lower extremity. The patient has been tolerating her treatment with Tarceva fairly well. She had repeat CT scan of the chest performed recently and she is here for evaluation and discussion of her scan results.  MEDICAL HISTORY: Past Medical History:  Diagnosis Date  . AAA (abdominal aortic aneurysm) ([facility])   . Anemia of chronic disease 11/14/2014  . Chronic kidney disease   . COPD (chronic obstructive pulmonary disease) ([facility])   . Hypertension   . Lung cancer ([facility])   . Pernicious anemia   . Primary cancer of right upper lobe of lung ([facility]) 05/10/2012  . Stroke ([facility])   . Thyroid disease     ALLERGIES:  is allergic to codeine; penicillins; and pravastatin sodium.  MEDICATIONS:  Current Outpatient Prescriptions  Medication Sig Dispense Refill  . acetaminophen (TYLENOL) 500 MG tablet Take 500-1,000 mg by mouth every 6 (six) hours as needed for moderate pain.      Marland Kitchen albuterol (PROVENTIL) (2.5 MG/3ML) 0.083% nebulizer solution Take 2.5 mg by nebulization every 4 (four) hours. Or Four times daily    . ALPRAZolam (XANAX) 1 MG tablet Take 1 mg by mouth 4 (four) times daily as needed. For anxiety and/or sleep    . amLODipine (NORVASC) 2.5 MG tablet Take 2.5 mg by mouth daily.  11  . aspirin EC 81 MG EC tablet Take 1 tablet (81 mg total) by mouth daily.    . clindamycin (CLEOCIN T) 1 % external solution Apply topically 2 (two) times daily. (Patient taking differently: Apply 1 application topically as needed. ) 30 mL 1  . Cyanocobalamin (VITAMIN B-12) 1000 MCG SUBL Place 1 tablet under the tongue daily.    . ferrous sulfate 325 (65 FE) MG tablet Take 325 mg by mouth 3 (three) times daily with meals.    . Fluticasone-Salmeterol (ADVAIR) 250-50 MCG/DOSE AEPB Inhale 1 puff into the lungs every 12 (twelve) hours.    Marland Kitchen guaiFENesin (MUCINEX) 600 MG 12 hr tablet Take 600 mg by mouth daily as needed for cough.     . levothyroxine (SYNTHROID, LEVOTHROID) 100 MCG tablet Take 100 mcg by mouth daily before breakfast.     . loperamide (IMODIUM) 2 MG capsule Take 2 mg by mouth as needed for diarrhea or loose stools.    Marland Kitchen loratadine (CLARITIN) 10 MG tablet Take 10 mg by mouth daily as needed for allergies.     . mirtazapine (REMERON) 7.5 MG tablet Take 7.5 mg  by mouth at bedtime.  12  . NON FORMULARY OXYGEN      24/7    . potassium chloride SA (K-DUR,KLOR-CON) 20 MEQ tablet Take 1 tablet (20 mEq total) by mouth daily. 7 tablet 0  . TARCEVA 150 MG tablet TAKE 1 TABLET BY MOUTH DAILY. TAKE ON AN EMPTY STOMACH 1 HOUR BEFORE MEALS OR 2 HOURS AFTER. 30 tablet 2  . torsemide (DEMADEX) 10 MG tablet Take 10 mg by mouth.    . Vitamin D, Ergocalciferol, (DRISDOL) 50000 UNITS CAPS capsule Take 50,000 Units by mouth every 30 (thirty) days. Reported on 10/09/2015     No current facility-administered medications for this visit.     SURGICAL HISTORY:  Past Surgical History:  Procedure  Laterality Date  . ABDOMINAL HYSTERECTOMY    . APPENDECTOMY    . CHEST TUBE INSERTION  03/08/2012   Procedure: CHEST TUBE INSERTION;  Surgeon: Ivin Poot, MD;  Location: [facility];  Service: Thoracic;  Laterality: Right;  . CHOLECYSTECTOMY    . COLONOSCOPY N/A 04/29/2014   Procedure: COLONOSCOPY;  Surgeon: Daneil Dolin, MD;  [facility];  Service: Endoscopy;  Laterality: N/A;  1245pm  . ESOPHAGOGASTRODUODENOSCOPY N/A 04/29/2014   Procedure: ESOPHAGOGASTRODUODENOSCOPY (EGD);  Surgeon: Daneil Dolin, MD;  [facility];  Service: Endoscopy;  Laterality: N/A;  Venia Minks DILATION N/A 04/29/2014   Procedure: Venia Minks DILATION;  Surgeon: Daneil Dolin, MD;  [facility];  Service: Endoscopy;  Laterality: N/A;  . RESECTION OF APICAL BLEB  03/17/2012   Procedure: RESECTION OF APICAL BLEB;  Surgeon: Ivin Poot, MD;  Location: [facility] OR;  Service: Thoracic;  Laterality: Right;  stapling of bleb  . SAVORY DILATION N/A 04/29/2014   Procedure: SAVORY DILATION;  Surgeon: Daneil Dolin, MD;  [facility];  Service: Endoscopy;  Laterality: N/A;  . TEE WITHOUT CARDIOVERSION N/A 11/15/2014   Procedure: TRANSESOPHAGEAL ECHOCARDIOGRAM (TEE);  Surgeon: Sueanne Margarita, MD;  Location: [facility] ENDOSCOPY;  Service: Cardiovascular;  Laterality: N/A;  . TUBAL LIGATION    . VIDEO ASSISTED THORACOSCOPY  03/17/2012   Procedure: VIDEO ASSISTED THORACOSCOPY;  Surgeon: Ivin Poot, MD;  Location: [facility] OR;  Service: Thoracic;  Laterality: Right;    REVIEW OF SYSTEMS:  Constitutional: positive for fatigue Eyes: negative Ears, nose, mouth, throat, and face: negative Respiratory: positive for cough and dyspnea on exertion Cardiovascular: negative Gastrointestinal: negative Genitourinary:negative Integument/breast: negative Hematologic/lymphatic: negative Musculoskeletal:positive for arthralgias, bone pain and muscle weakness Neurological: negative Behavioral/Psych:  negative Endocrine: negative Allergic/Immunologic: negative   PHYSICAL EXAMINATION: General appearance: alert, cooperative, fatigued and no distress Head: Normocephalic, without obvious abnormality, atraumatic Neck: no adenopathy, no JVD, supple, symmetrical, trachea midline and thyroid not enlarged, symmetric, no tenderness/mass/nodules Lymph nodes: Cervical, supraclavicular, and axillary nodes normal. Resp: clear to auscultation bilaterally Back: symmetric, no curvature. ROM normal. No CVA tenderness. Cardio: regular rate and rhythm, S1, S2 normal, no murmur, click, rub or gallop GI: soft, non-tender; bowel sounds normal; no masses,  no organomegaly Extremities: edema 2+ edema in the right lower extremity Neurologic: Alert and oriented X 3, normal strength and tone. Normal symmetric reflexes. Normal coordination and gait  ECOG PERFORMANCE STATUS: 2 - Symptomatic, <50% confined to bed  Blood pressure (!) 161/73, pulse 82, temperature 98.3 F (36.8 C), temperature source Oral, resp. rate 18, height 5' 4"  (1.626 m), weight 100 lb 1.6 oz (45.4 kg), SpO2 100 %. Repeat blood pressure was 152/54  LABORATORY DATA: Lab Results  Component Value Date   WBC 4.9 05/17/2016   HGB 9.2 (L) 05/17/2016   HCT 29.7 (L) 05/17/2016   MCV 85.6 05/17/2016   PLT 406 (H) 05/17/2016      Chemistry      Component Value Date/Time   NA 141 05/17/2016 0849   K 4.1 05/17/2016 0849   CL 103 11/13/2014 1744   CO2 29 05/17/2016 0849   BUN 15.1 05/17/2016 0849   CREATININE 0.7 05/17/2016 0849      Component Value Date/Time   CALCIUM 7.6 (L) 05/17/2016 0849   ALKPHOS 222 (H) 05/17/2016 0849   AST 15 05/17/2016 0849   ALT 10 05/17/2016 0849   BILITOT 0.43 05/17/2016 0849       RADIOGRAPHIC STUDIES: Ct Chest W Contrast  Result Date: 05/17/2016 CLINICAL DATA:  restaging right lung cancer diagnosed in 2013. Ongoing oral chemotherapy. Prior wedge resection in the right upper lobe and bow lobectomy and  prior right pleurodesis. EXAM: CT CHEST, ABDOMEN, AND PELVIS WITH CONTRAST TECHNIQUE: Multidetector CT imaging of the chest, abdomen and pelvis was performed following the standard protocol during bolus administration of intravenous contrast. CONTRAST:  20m ISOVUE-300 IOPAMIDOL (ISOVUE-300) INJECTION 61% COMPARISON:  02/11/2016 FINDINGS: CT CHEST FINDINGS Cardiovascular: Coronary, aortic arch, and branch vessel atherosclerotic vascular disease. Mediastinum/Nodes: Indistinctly marginated right lower paratracheal node 1.1 cm in short axis on image 19/2, stable. Rightward deviation of the trachea and mediastinal structures due to volume loss in the right hemithorax which appears chronic. Lungs/Pleura: Postoperative findings in the right chest with wedge resections staple lines and pleural thickening. Emphysema. Pattern of scarring in the right lung is generally similar to prior. Peribronchovascular nodularity in the right middle lobe measures up to 10 mm in short axis on image 49/5, previously 9 mm. An abnormal nodule with focal interstitial thickening in the right middle lobe measuring 2.2 by 2.0 cm on image 67/5 previously measured 1.5 by 1.3 cm. A similar region in the right lower lobe measures 1.6 by 1.5 cm on image 78/5, previously 1.2 by 1.3 cm by my measurements. These last 2 lesions are concerning for low-grade adenocarcinoma. Worsened ground-glass and interstitial accentuation in the right lower lobe inferiorly. Small right pleural effusion is worsened. Somewhat spiculated 1.3 by 1.6 cm nodule in the left upper lobe on image 60/5 previously measured 1.2 by 1.1 cm. In the left lower lobe with a 2.2 by 1.5 cm nodule on image 107/5 previously measured 1.5 by 1.1 cm. Medially in the left lower lobe on image 108/5, a pleural-based 2.1 by 1.5 cm nodule previously measured 2.0 by 1.1 cm. The Musculoskeletal: Thoracic kyphosis. CT ABDOMEN PELVIS FINDINGS Hepatobiliary: Cholecystectomy.  Otherwise unremarkable.  Pancreas: Chronic calcific pancreatitis with scattered coarse calcifications, pancreatic atrophy, and underlying mildly dilated dorsal pancreatic duct. Spleen: Unremarkable Adrenals/Urinary Tract: 7 mm hypodense lesion of the right kidney lower pole is likely a cyst but technically too small to characterize. Small cyst of the left mid kidney. Adrenal glands unremarkable. Several small renal calculi are present, for example in the right mid kidney on image 82/6 and in the left kidney upper pole on image 77 of series 6. Stomach/Bowel: Unremarkable Vascular/Lymphatic: Aortoiliac atherosclerotic vascular disease. Infrarenal abdominal aortic aneurysm with mural thrombus, 5 cm transverse dimension, previously 4.9 cm. Similar configuration to prior. Severe atherosclerotic narrowing in the left common iliac artery. This is also similar to prior. No pathologic adenopathy identified. Reproductive: Uterus absent.  Adnexa unremarkable. Other:  Low-grade diffuse subcutaneous edema. Musculoskeletal: Schmorl's node along the  inferior endplate of L4. IMPRESSION: 1. Multiple enlarging pulmonary nodules compatible with progressive malignancy. 2. Atherosclerosis. The infrarenal abdominal aortic aneurysm measures 5 cm 5.0 cm in transverse dimension, previously 4.9 cm. Recommend followup by abdomen and pelvis CTA in 3-6 months, and vascular surgery referral/consultation if not already obtained. This recommendation follows ACR consensus guidelines: White Paper of the ACR Incidental Findings Committee II on Vascular Findings. J Am Coll Radiol 2013; 10:789-794. 3. Stable mildly enlarged right lower paratracheal lymph node. 4. Stable appearance of chronic calcific pancreatitis. 5. Mild increase in the small right pleural effusion. This is against a background of pleural thickening likely related to prior pleurodesis. There is also some low-grade subcutaneous edema potentially reflecting mild third spacing of fluid. 6. Severe atherosclerotic  narrowing of the left common iliac artery. 7. Emphysema. 8. Small bilateral nonobstructive renal calculi. Electronically Signed   By: Van Clines M.D.   On: 05/17/2016 11:25   Ct Abdomen Pelvis W Contrast  Result Date: 05/17/2016 CLINICAL DATA:  restaging right lung cancer diagnosed in 2013. Ongoing oral chemotherapy. Prior wedge resection in the right upper lobe and bow lobectomy and prior right pleurodesis. EXAM: CT CHEST, ABDOMEN, AND PELVIS WITH CONTRAST TECHNIQUE: Multidetector CT imaging of the chest, abdomen and pelvis was performed following the standard protocol during bolus administration of intravenous contrast. CONTRAST:  82m ISOVUE-300 IOPAMIDOL (ISOVUE-300) INJECTION 61% COMPARISON:  02/11/2016 FINDINGS: CT CHEST FINDINGS Cardiovascular: Coronary, aortic arch, and branch vessel atherosclerotic vascular disease. Mediastinum/Nodes: Indistinctly marginated right lower paratracheal node 1.1 cm in short axis on image 19/2, stable. Rightward deviation of the trachea and mediastinal structures due to volume loss in the right hemithorax which appears chronic. Lungs/Pleura: Postoperative findings in the right chest with wedge resections staple lines and pleural thickening. Emphysema. Pattern of scarring in the right lung is generally similar to prior. Peribronchovascular nodularity in the right middle lobe measures up to 10 mm in short axis on image 49/5, previously 9 mm. An abnormal nodule with focal interstitial thickening in the right middle lobe measuring 2.2 by 2.0 cm on image 67/5 previously measured 1.5 by 1.3 cm. A similar region in the right lower lobe measures 1.6 by 1.5 cm on image 78/5, previously 1.2 by 1.3 cm by my measurements. These last 2 lesions are concerning for low-grade adenocarcinoma. Worsened ground-glass and interstitial accentuation in the right lower lobe inferiorly. Small right pleural effusion is worsened. Somewhat spiculated 1.3 by 1.6 cm nodule in the left upper lobe  on image 60/5 previously measured 1.2 by 1.1 cm. In the left lower lobe with a 2.2 by 1.5 cm nodule on image 107/5 previously measured 1.5 by 1.1 cm. Medially in the left lower lobe on image 108/5, a pleural-based 2.1 by 1.5 cm nodule previously measured 2.0 by 1.1 cm. The Musculoskeletal: Thoracic kyphosis. CT ABDOMEN PELVIS FINDINGS Hepatobiliary: Cholecystectomy.  Otherwise unremarkable. Pancreas: Chronic calcific pancreatitis with scattered coarse calcifications, pancreatic atrophy, and underlying mildly dilated dorsal pancreatic duct. Spleen: Unremarkable Adrenals/Urinary Tract: 7 mm hypodense lesion of the right kidney lower pole is likely a cyst but technically too small to characterize. Small cyst of the left mid kidney. Adrenal glands unremarkable. Several small renal calculi are present, for example in the right mid kidney on image 82/6 and in the left kidney upper pole on image 77 of series 6. Stomach/Bowel: Unremarkable Vascular/Lymphatic: Aortoiliac atherosclerotic vascular disease. Infrarenal abdominal aortic aneurysm with mural thrombus, 5 cm transverse dimension, previously 4.9 cm. Similar configuration to prior. Severe atherosclerotic narrowing in  the left common iliac artery. This is also similar to prior. No pathologic adenopathy identified. Reproductive: Uterus absent.  Adnexa unremarkable. Other:  Low-grade diffuse subcutaneous edema. Musculoskeletal: Schmorl's node along the inferior endplate of L4. IMPRESSION: 1. Multiple enlarging pulmonary nodules compatible with progressive malignancy. 2. Atherosclerosis. The infrarenal abdominal aortic aneurysm measures 5 cm 5.0 cm in transverse dimension, previously 4.9 cm. Recommend followup by abdomen and pelvis CTA in 3-6 months, and vascular surgery referral/consultation if not already obtained. This recommendation follows ACR consensus guidelines: White Paper of the ACR Incidental Findings Committee II on Vascular Findings. J Am Coll Radiol 2013;  10:789-794. 3. Stable mildly enlarged right lower paratracheal lymph node. 4. Stable appearance of chronic calcific pancreatitis. 5. Mild increase in the small right pleural effusion. This is against a background of pleural thickening likely related to prior pleurodesis. There is also some low-grade subcutaneous edema potentially reflecting mild third spacing of fluid. 6. 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 7. Emphysema. 8. Small bilateral nonobstructive renal calculi. Electronically Signed   By: Van Clines M.D.   On: 05/17/2016 11:25    ASSESSMENT AND PLAN:  This is a very pleasant 76 years old white female with recurrent non-small cell lung cancer, adenocarcinoma with positive EGFR mutation in exon 21. She has been on treatment with Tarceva 150 mg by mouth daily for 21 months. The patient has been tolerating her treatment well with no significant adverse effects except for intermittent skin rash and diarrhea. She had repeat CT scan of the chest performed recently. I personally and independently reviewed the scan images and discuss the results with the patient and her daughter. Unfortunately the scan showed evidence for further disease progression. I had a lengthy discussion with the patient and her daughter about her current condition. She will continue on treatment with Tarceva for now. I will send a blood test to [facility] for molecular studies and to rule out the presence of T790M resistant mutation. Unfortunately the patient is high risk for biopsy of any of these lesions. I will see her back for follow-up visit in 3 weeks for evaluation and discussion of her treatment options based on the molecular studies. For the anemia of neoplastic disease the patient received 2 units of PRBCs transfusion and her hemoglobin and hematocrit are better. We will continue to monitor for now. For the hypertension, I strongly advised the patient to continue on her blood  pressure medication and to monitor her blood pressure at home. She was advised to call immediately if she has any concerning symptoms in the interval. The patient voices understanding of current disease status and treatment options and is in agreement with the current care plan.  All questions were answered. The patient knows to call the clinic with any problems, questions or concerns. We can certainly see the patient much sooner if necessary.  Disclaimer: This note was dictated with voice recognition software. Similar sounding words can inadvertently be transcribed and may not be corrected upon review.

## 2016-05-18 NOTE — Progress Notes (Signed)
Oncology Nurse Navigator Documentation  Oncology Nurse Navigator Flowsheets 05/18/2016  Navigator Location [facility]  Navigator Encounter Type Other/per Dr. Julien Nordmann, I completed [facility] form and gave it to lab  Patient Visit Type MedOnc  Treatment Phase Treatment  Barriers/Navigation Needs Coordination of Care  Interventions Coordination of Care  Coordination of Care Other  Acuity Level 2  Acuity Level 2 Other  Time Spent with Patient 15

## 2016-05-18 NOTE — Telephone Encounter (Signed)
Appointments scheduled per 05/18/16 los. Patient was given a copy of the appointment schedule and AVS report, per 05/18/16 los.

## 2016-05-24 DIAGNOSIS — Z0001 Encounter for general adult medical examination with abnormal findings: Secondary | ICD-10-CM | POA: Diagnosis not present

## 2016-05-24 DIAGNOSIS — Z681 Body mass index (BMI) 19 or less, adult: Secondary | ICD-10-CM | POA: Diagnosis not present

## 2016-05-24 DIAGNOSIS — J449 Chronic obstructive pulmonary disease, unspecified: Secondary | ICD-10-CM | POA: Diagnosis not present

## 2016-05-24 DIAGNOSIS — E441 Mild protein-calorie malnutrition: Secondary | ICD-10-CM | POA: Diagnosis not present

## 2016-05-24 DIAGNOSIS — L309 Dermatitis, unspecified: Secondary | ICD-10-CM | POA: Diagnosis not present

## 2016-05-25 DIAGNOSIS — J439 Emphysema, unspecified: Secondary | ICD-10-CM | POA: Diagnosis not present

## 2016-05-25 DIAGNOSIS — J449 Chronic obstructive pulmonary disease, unspecified: Secondary | ICD-10-CM | POA: Diagnosis not present

## 2016-06-04 ENCOUNTER — Encounter: Payer: Self-pay | Admitting: Surgery

## 2016-06-08 DIAGNOSIS — J449 Chronic obstructive pulmonary disease, unspecified: Secondary | ICD-10-CM | POA: Diagnosis not present

## 2016-06-08 DIAGNOSIS — J9610 Chronic respiratory failure, unspecified whether with hypoxia or hypercapnia: Secondary | ICD-10-CM | POA: Diagnosis not present

## 2016-06-09 ENCOUNTER — Other Ambulatory Visit: Payer: Self-pay | Admitting: *Deleted

## 2016-06-09 DIAGNOSIS — C3411 Malignant neoplasm of upper lobe, right bronchus or lung: Secondary | ICD-10-CM

## 2016-06-10 ENCOUNTER — Encounter (HOSPITAL_COMMUNITY): Payer: Self-pay | Admitting: Emergency Medicine

## 2016-06-10 ENCOUNTER — Ambulatory Visit (HOSPITAL_BASED_OUTPATIENT_CLINIC_OR_DEPARTMENT_OTHER): Payer: PPO | Admitting: Internal Medicine

## 2016-06-10 ENCOUNTER — Other Ambulatory Visit (HOSPITAL_BASED_OUTPATIENT_CLINIC_OR_DEPARTMENT_OTHER): Payer: PPO

## 2016-06-10 ENCOUNTER — Emergency Department (HOSPITAL_COMMUNITY): Payer: PPO

## 2016-06-10 ENCOUNTER — Encounter: Payer: Self-pay | Admitting: Internal Medicine

## 2016-06-10 ENCOUNTER — Emergency Department (HOSPITAL_COMMUNITY)
Admission: EM | Admit: 2016-06-10 | Discharge: 2016-06-10 | Disposition: A | Discharge status: Home / Self Care | Payer: PPO | Attending: Emergency Medicine | Admitting: Emergency Medicine

## 2016-06-10 ENCOUNTER — Telehealth: Payer: Self-pay | Admitting: Internal Medicine

## 2016-06-10 VITALS — BP 144/64 | HR 79 | Temp 97.7°F | Resp 18 | Wt 95.2 lb

## 2016-06-10 DIAGNOSIS — I10 Essential (primary) hypertension: Secondary | ICD-10-CM | POA: Insufficient documentation

## 2016-06-10 DIAGNOSIS — Y999 Unspecified external cause status: Secondary | ICD-10-CM | POA: Insufficient documentation

## 2016-06-10 DIAGNOSIS — J449 Chronic obstructive pulmonary disease, unspecified: Secondary | ICD-10-CM | POA: Insufficient documentation

## 2016-06-10 DIAGNOSIS — R071 Chest pain on breathing: Secondary | ICD-10-CM | POA: Diagnosis not present

## 2016-06-10 DIAGNOSIS — S299XXA Unspecified injury of thorax, initial encounter: Secondary | ICD-10-CM | POA: Diagnosis not present

## 2016-06-10 DIAGNOSIS — Z79899 Other long term (current) drug therapy: Secondary | ICD-10-CM | POA: Diagnosis not present

## 2016-06-10 DIAGNOSIS — C3411 Malignant neoplasm of upper lobe, right bronchus or lung: Secondary | ICD-10-CM | POA: Diagnosis not present

## 2016-06-10 DIAGNOSIS — M545 Low back pain: Secondary | ICD-10-CM | POA: Insufficient documentation

## 2016-06-10 DIAGNOSIS — Y929 Unspecified place or not applicable: Secondary | ICD-10-CM | POA: Diagnosis not present

## 2016-06-10 DIAGNOSIS — W19XXXA Unspecified fall, initial encounter: Secondary | ICD-10-CM

## 2016-06-10 DIAGNOSIS — D630 Anemia in neoplastic disease: Secondary | ICD-10-CM | POA: Diagnosis not present

## 2016-06-10 DIAGNOSIS — Y9389 Activity, other specified: Secondary | ICD-10-CM | POA: Diagnosis not present

## 2016-06-10 DIAGNOSIS — W1839XA Other fall on same level, initial encounter: Secondary | ICD-10-CM | POA: Diagnosis not present

## 2016-06-10 DIAGNOSIS — Z87891 Personal history of nicotine dependence: Secondary | ICD-10-CM | POA: Diagnosis not present

## 2016-06-10 DIAGNOSIS — Z7982 Long term (current) use of aspirin: Secondary | ICD-10-CM | POA: Diagnosis not present

## 2016-06-10 DIAGNOSIS — Z85118 Personal history of other malignant neoplasm of bronchus and lung: Secondary | ICD-10-CM | POA: Diagnosis not present

## 2016-06-10 DIAGNOSIS — D638 Anemia in other chronic diseases classified elsewhere: Secondary | ICD-10-CM

## 2016-06-10 DIAGNOSIS — Z5111 Encounter for antineoplastic chemotherapy: Secondary | ICD-10-CM

## 2016-06-10 LAB — CBC WITH DIFFERENTIAL/PLATELET
BASO%: 0.5 % (ref 0.0–2.0)
Basophils Absolute: 0 10*3/uL (ref 0.0–0.1)
EOS%: 2.9 % (ref 0.0–7.0)
Eosinophils Absolute: 0.1 10*3/uL (ref 0.0–0.5)
HCT: 27.7 % — ABNORMAL LOW (ref 34.8–46.6)
HGB: 8.6 g/dL — ABNORMAL LOW (ref 11.6–15.9)
LYMPH%: 18.2 % (ref 14.0–49.7)
MCH: 27.9 pg (ref 25.1–34.0)
MCHC: 31 g/dL — ABNORMAL LOW (ref 31.5–36.0)
MCV: 89.9 fL (ref 79.5–101.0)
MONO#: 0.4 10*3/uL (ref 0.1–0.9)
MONO%: 9.7 % (ref 0.0–14.0)
NEUT%: 68.7 % (ref 38.4–76.8)
NEUTROS ABS: 2.6 10*3/uL (ref 1.5–6.5)
PLATELETS: 245 10*3/uL (ref 145–400)
RBC: 3.08 10*6/uL — AB (ref 3.70–5.45)
RDW: 21.3 % — ABNORMAL HIGH (ref 11.2–14.5)
WBC: 3.7 10*3/uL — AB (ref 3.9–10.3)
lymph#: 0.7 10*3/uL — ABNORMAL LOW (ref 0.9–3.3)

## 2016-06-10 LAB — COMPREHENSIVE METABOLIC PANEL
ALT: 12 U/L (ref 0–55)
AST: 16 U/L (ref 5–34)
Albumin: 2.1 g/dL — ABNORMAL LOW (ref 3.5–5.0)
Alkaline Phosphatase: 193 U/L — ABNORMAL HIGH (ref 40–150)
Anion Gap: 7 mEq/L (ref 3–11)
BILIRUBIN TOTAL: 0.43 mg/dL (ref 0.20–1.20)
BUN: 19 mg/dL (ref 7.0–26.0)
CHLORIDE: 109 meq/L (ref 98–109)
CO2: 24 meq/L (ref 22–29)
CREATININE: 0.7 mg/dL (ref 0.6–1.1)
Calcium: 7.4 mg/dL — ABNORMAL LOW (ref 8.4–10.4)
EGFR: 83 mL/min/{1.73_m2} — ABNORMAL LOW (ref >90)
GLUCOSE: 101 mg/dL (ref 70–140)
Potassium: 3.4 mEq/L — ABNORMAL LOW (ref 3.5–5.1)
SODIUM: 140 meq/L (ref 136–145)
TOTAL PROTEIN: 5 g/dL — AB (ref 6.4–8.3)

## 2016-06-10 MED ORDER — ACETAMINOPHEN 500 MG PO TABS
500.0000 mg | ORAL_TABLET | Freq: Once | ORAL | Status: AC
  Administered 2016-06-10: 500 mg via ORAL
  Filled 2016-06-10: qty 1

## 2016-06-10 MED ORDER — OSIMERTINIB MESYLATE 80 MG PO TABS: 80.0000 mg | ORAL_TABLET | Freq: Every day | ORAL | 2 refills | Status: DC

## 2016-06-10 NOTE — ED Triage Notes (Signed)
[place] [address] about 1300 today, c/o pain to shoulder blasde and c/o pain with deep breathe.  Denies hitting head or LOC.  Pt says she was going up the steps and lost footing and fell down one step (brick).  Pt says she thinks she hit her shoulders on flower bed wood trimming.

## 2016-06-10 NOTE — Discharge Instructions (Signed)
As discussed, your evaluation today has been largely reassuring.  But, it is important that you monitor your condition carefully, and do not hesitate to return to the ED if you develop new, or concerning changes in your condition. ? ?Otherwise, please follow-up with your physician for appropriate ongoing care. ? ?

## 2016-06-10 NOTE — ED Notes (Signed)
Informed of possible 2 hour or more wait, depending on EMS.

## 2016-06-10 NOTE — ED Provider Notes (Signed)
[facility] DEPT Provider Note   CSN: [id number] Arrival date & time: 06/10/16  1456     History   Chief Complaint Chief Complaint  Patient presents with  . Fall    HPI [name] is a 76 y.o. female.  HPI  Patient presents after a fall. Fall occurred 4 hours ago. Falls mechanical, patient fell backwards, striking stairs with her upper thorax, posteriorly. No head trauma, no loss of consciousness, no surgical neck pain, head pain, confusion, disorientation, nausea, vomiting. Patient has had mild soreness across her mid lower back since the fall. No medication taken for relief. Patient has baseline dyspnea, has history of COPD, ongoing therapy for lung cancer.   Past Medical History:  Diagnosis Date  . AAA (abdominal aortic aneurysm) ([place])   . Anemia of chronic disease 11/14/2014  . Chronic kidney disease   . COPD (chronic obstructive pulmonary disease) ([place])   . Hypertension   . Lung cancer ([place])   . Pernicious anemia   . Primary cancer of right upper lobe of lung ([place]) 05/10/2012  . Stroke ([place])   . Thyroid disease     Patient Active Problem List   Diagnosis Date Noted  . Hypertension 05/18/2016  . AAA (abdominal aortic aneurysm) ([place]) 03/04/2015  . Swelling of right lower extremity 03/04/2015  . Anemia of chronic disease 11/14/2014  . Hypokalemia 11/14/2014  . Essential hypertension 11/14/2014  . Acute right PCA stroke ([place]) 11/13/2014  . CVA (cerebral infarction) 11/13/2014  . COPD (chronic obstructive pulmonary disease) ([place]) 11/13/2014  . Chronic respiratory failure ([place]) 11/13/2014  . Cellulitis of right leg 09/23/2014  . Encounter for antineoplastic chemotherapy 09/23/2014  . Mucosal abnormality of stomach   . Esophageal ring   . Special screening for malignant neoplasms, colon   . Dysphagia, pharyngoesophageal phase 04/19/2014  . Encounter for screening colonoscopy 04/19/2014  . Spontaneous pneumothorax 05/10/2012  . Primary cancer of right upper  lobe of lung ([place]) 05/10/2012    Past Surgical History:  Procedure Laterality Date  . ABDOMINAL HYSTERECTOMY    . APPENDECTOMY    . CHEST TUBE INSERTION  03/08/2012   Procedure: CHEST TUBE INSERTION;  Surgeon: Ivin Poot, MD;  Location: [facility];  Service: Thoracic;  Laterality: Right;  . CHOLECYSTECTOMY    . COLONOSCOPY N/A 04/29/2014   Procedure: COLONOSCOPY;  Surgeon: Daneil Dolin, MD;  [facility];  Service: Endoscopy;  Laterality: N/A;  1245pm  . ESOPHAGOGASTRODUODENOSCOPY N/A 04/29/2014   Procedure: ESOPHAGOGASTRODUODENOSCOPY (EGD);  Surgeon: Daneil Dolin, MD;  [facility];  Service: Endoscopy;  Laterality: N/A;  Venia Minks DILATION N/A 04/29/2014   Procedure: Venia Minks DILATION;  Surgeon: Daneil Dolin, MD;  [facility];  Service: Endoscopy;  Laterality: N/A;  . RESECTION OF APICAL BLEB  03/17/2012   Procedure: RESECTION OF APICAL BLEB;  Surgeon: Ivin Poot, MD;  Location: [facility] OR;  Service: Thoracic;  Laterality: Right;  stapling of bleb  . SAVORY DILATION N/A 04/29/2014   Procedure: SAVORY DILATION;  Surgeon: Daneil Dolin, MD;  [facility];  Service: Endoscopy;  Laterality: N/A;  . TEE WITHOUT CARDIOVERSION N/A 11/15/2014   Procedure: TRANSESOPHAGEAL ECHOCARDIOGRAM (TEE);  Surgeon: Sueanne Margarita, MD;  Location: [facility] ENDOSCOPY;  Service: Cardiovascular;  Laterality: N/A;  . TUBAL LIGATION    . VIDEO ASSISTED THORACOSCOPY  03/17/2012   Procedure: VIDEO ASSISTED THORACOSCOPY;  Surgeon: Ivin Poot, MD;  Location: [facility];  Service: Thoracic;  Laterality: Right;    OB History    Gravida Para Term Preterm AB Living   '4 4 2 2       '$ SAB TAB Ectopic Multiple Live Births                   Home Medications    Prior to Admission medications   Medication Sig Start Date End Date Taking? Authorizing Provider  acetaminophen (TYLENOL) 500 MG tablet Take 500-1,000 mg by mouth every 6 (six) hours as needed for moderate pain.      Historical Provider, MD  albuterol (PROVENTIL) (2.5 MG/3ML) 0.083% nebulizer solution Take 2.5 mg by nebulization every 4 (four) hours. Or Four times daily    Historical Provider, MD  ALPRAZolam Duanne Moron) 1 MG tablet Take 1 mg by mouth 4 (four) times daily as needed. For anxiety and/or sleep    Historical Provider, MD  amLODipine (NORVASC) 2.5 MG tablet Take 2.5 mg by mouth daily. 07/24/15   Historical Provider, MD  aspirin EC 81 MG EC tablet Take 1 tablet (81 mg total) by mouth daily. 11/15/14   Delfina Redwood, MD  clindamycin (CLEOCIN T) 1 % external solution Apply topically 2 (two) times daily. Patient taking differently: Apply 1 application topically as needed.  06/03/14   Carlton Adam, PA-C  Cyanocobalamin (VITAMIN B-12) 1000 MCG SUBL Place 1 tablet under the tongue daily.    Historical Provider, MD  ferrous sulfate 325 (65 FE) MG tablet Take 325 mg by mouth 3 (three) times daily with meals.    Historical Provider, MD  Fluticasone-Salmeterol (ADVAIR) 250-50 MCG/DOSE AEPB Inhale 1 puff into the lungs every 12 (twelve) hours.    Historical Provider, MD  guaiFENesin (MUCINEX) 600 MG 12 hr tablet Take 600 mg by mouth daily as needed for cough.     Historical Provider, MD  levothyroxine (SYNTHROID, LEVOTHROID) 100 MCG tablet Take 100 mcg by mouth daily before breakfast.     Historical Provider, MD  loperamide (IMODIUM) 2 MG capsule Take 2 mg by mouth as needed for diarrhea or loose stools.    Historical Provider, MD  loratadine (CLARITIN) 10 MG tablet Take 10 mg by mouth daily as needed for allergies.     Historical Provider, MD  mirtazapine (REMERON) 7.5 MG tablet Take 7.5 mg by mouth at bedtime. 02/20/15   Historical Provider, MD  NON FORMULARY OXYGEN      24/7    Historical Provider, MD  osimertinib mesylate (TAGRISSO) 80 MG tablet Take 1 tablet (80 mg total) by mouth daily. 06/10/16   Curt Bears, MD  potassium chloride SA (K-DUR,KLOR-CON) 20 MEQ tablet Take 1 tablet (20 mEq total) by mouth  daily. 03/04/15   Curt Bears, MD  torsemide (DEMADEX) 10 MG tablet Take 10 mg by mouth. 02/11/16   Historical Provider, MD  Vitamin D, Ergocalciferol, (DRISDOL) 50000 UNITS CAPS capsule Take 50,000 Units by mouth every 30 (thirty) days. Reported on 10/09/2015    Historical Provider, MD    Family History Family History  Problem Relation Age of Onset  . Cancer Mother   . Heart failure Brother   . Heart disease Brother     before age 47  . Colon cancer Neg Hx     Social History Social History  Substance Use Topics  . Smoking status: Former Smoker    Types: Cigarettes    Quit date: 04/05/2006  . Smokeless tobacco: Former Systems developer    Quit date: 03/07/2006  . Alcohol use No  Allergies   Codeine; Penicillins; and Pravastatin sodium   Review of Systems Review of Systems  Constitutional:       Per HPI, otherwise negative  HENT:       Per HPI, otherwise negative  Respiratory:       Per HPI, otherwise negative  Cardiovascular:       Per HPI, otherwise negative  Gastrointestinal: Negative for vomiting.  Endocrine:       Negative aside from HPI  Genitourinary:       Neg aside from HPI   Musculoskeletal:       Per HPI, otherwise negative  Skin: Negative.   Allergic/Immunologic: Positive for immunocompromised state.  Neurological: Negative for syncope.     Physical Exam Updated Vital Signs BP 117/59 (BP Location: Left Arm)   Pulse 85   Temp 98.1 F (36.7 C) (Oral)   Resp 16   Ht '5\' 4"'$  (1.626 m)   Wt 95 lb 3.2 oz (43.2 kg)   SpO2 100%   BMI 16.34 kg/m   Physical Exam  Constitutional: She is oriented to person, place, and time. No distress.  Frail, sickly appearing elderly female  HENT:  Head: Normocephalic and atraumatic.  Eyes: Conjunctivae and EOM are normal.  Pulmonary/Chest: Effort normal. No stridor. No respiratory distress.      Abdominal: She exhibits no distension.  Musculoskeletal: She exhibits no edema.  Neurological: She is alert and oriented  to person, place, and time. No cranial nerve deficit.  Skin: Skin is warm and dry.  Psychiatric: She has a normal mood and affect.  Nursing note and vitals reviewed.    ED Treatments / Results   Radiology Dg Thoracic Spine W/swimmers  Result Date: 06/10/2016 CLINICAL DATA:  [place] onto the back, pain below the shoulders EXAM: THORACIC SPINE - 3 VIEWS COMPARISON:  05/17/2016 FINDINGS: Mild kyphoscoliosis of the thoracic spine. Osteopenia limits evaluation. Thoracic alignment grossly stable. Postsurgical changes in the right upper lung with pleural and parenchymal scarring. Surgical clips in the right upper quadrant. Old right-sided rib fractures. IMPRESSION: Kyphoscoliosis and osteopenia limit evaluation for fracture. CT is suggested if there is continued concern for thoracic fracture. Electronically Signed   By: Donavan Foil M.D.   On: 06/10/2016 18:42    Procedures Procedures (including critical care time)  Medications Ordered in ED Medications  acetaminophen (TYLENOL) tablet 500 mg (500 mg Oral Given 06/10/16 1816)   On repeat exam the patient is in no distress. All findings discussed the patient and 2 daughters. We discussed possibility of occult fracture, though there is little suspicion for this. Patient states that she continues to feel better. With no evidence for distress, patient appropriate for discharge with close outpatient follow-up.   Initial Impression / Assessment and Plan / ED Course  I have reviewed the triage vital signs and the nursing notes.  Pertinent labs & imaging results that were available during my care of the patient were reviewed by me and considered in my medical decision making (see chart for details).    Final Clinical Impressions(s) / ED Diagnoses  Fall, initial encounter   Carmin Muskrat, MD 06/10/16 [phone]

## 2016-06-10 NOTE — Telephone Encounter (Signed)
Appointments scheduled per 06/10/16 los. Patient was given a copy of the AVS report and appointment schedule per 06/10/16 los.

## 2016-06-10 NOTE — Progress Notes (Signed)
[facility] Telephone:([phone]) [phone]   Fax:(336) [phone]  OFFICE PROGRESS NOTE  Glo Herring., MD [place] [address]  DIAGNOSIS: Recurrent non-small cell lung cancer, adenocarcinoma with positive EGFR mutation in exon 21 initially diagnosed in December 2013.  PRIOR THERAPY: 1) Palliative radiotherapy to the enlarging right upper lobe lung mass under the care of Dr. Pablo Ledger completed on 01/06/2015. 2) Tarceva 150 mg by mouth daily status post 22 months of treatment discontinued secondary to disease progression.  CURRENT THERAPY: Tagrisso 80 mg by mouth daily.she is expected to start the first dose of this treatment in the next few days.   INTERVAL HISTORY: [name] 76 y.o. female came to the clinic today for follow-up visit accompanied by her daughter. The patient continues to complain of fatigue and weakness as well as shortness of breath with exertion.she is currently on treatment with Tarceva and has been tolerating her treatment well except for very mild skin rash and occasional diarrhea. She denied having any current chest pain or hemoptysis. She has no significant weight loss or night sweats. She has no fever or chills. She denied having any nausea, vomiting or constipation. The patient was found on recent CT scan of the chest, abdomen and pelvis to have evidence for disease progression.I ordered a blood test for molecular studies by [employer] and unfortunately the results are not conclusive for any mutationsven her original exon 19 mutation which could be a false negative results secondary to poor setting of the tumor ct DNA in the circulation. The patient is here today for evaluation and discussion of her treatment options.   MEDICAL HISTORY: Past Medical History:  Diagnosis Date  . AAA (abdominal aortic aneurysm) ([place])   . Anemia of chronic disease 11/14/2014  . Chronic kidney disease   . COPD (chronic obstructive pulmonary  disease) ([place])   . Hypertension   . Lung cancer ([place])   . Pernicious anemia   . Primary cancer of right upper lobe of lung ([place]) 05/10/2012  . Stroke ([place])   . Thyroid disease     ALLERGIES:  is allergic to codeine; penicillins; and pravastatin sodium.  MEDICATIONS:  Current Outpatient Prescriptions  Medication Sig Dispense Refill  . acetaminophen (TYLENOL) 500 MG tablet Take 500-1,000 mg by mouth every 6 (six) hours as needed for moderate pain.     Marland Kitchen albuterol (PROVENTIL) (2.5 MG/3ML) 0.083% nebulizer solution Take 2.5 mg by nebulization every 4 (four) hours. Or Four times daily    . ALPRAZolam (XANAX) 1 MG tablet Take 1 mg by mouth 4 (four) times daily as needed. For anxiety and/or sleep    . amLODipine (NORVASC) 2.5 MG tablet Take 2.5 mg by mouth daily.  11  . aspirin EC 81 MG EC tablet Take 1 tablet (81 mg total) by mouth daily.    . clindamycin (CLEOCIN T) 1 % external solution Apply topically 2 (two) times daily. (Patient taking differently: Apply 1 application topically as needed. ) 30 mL 1  . Cyanocobalamin (VITAMIN B-12) 1000 MCG SUBL Place 1 tablet under the tongue daily.    . ferrous sulfate 325 (65 FE) MG tablet Take 325 mg by mouth 3 (three) times daily with meals.    . Fluticasone-Salmeterol (ADVAIR) 250-50 MCG/DOSE AEPB Inhale 1 puff into the lungs every 12 (twelve) hours.    Marland Kitchen guaiFENesin (MUCINEX) 600 MG 12 hr tablet Take 600 mg by mouth daily as needed for cough.     Marland Kitchen  levothyroxine (SYNTHROID, LEVOTHROID) 100 MCG tablet Take 100 mcg by mouth daily before breakfast.     . loperamide (IMODIUM) 2 MG capsule Take 2 mg by mouth as needed for diarrhea or loose stools.    Marland Kitchen loratadine (CLARITIN) 10 MG tablet Take 10 mg by mouth daily as needed for allergies.     . mirtazapine (REMERON) 7.5 MG tablet Take 7.5 mg by mouth at bedtime.  12  . NON FORMULARY OXYGEN      24/7    . potassium chloride SA (K-DUR,KLOR-CON) 20 MEQ tablet Take 1 tablet (20 mEq total) by mouth daily. 7  tablet 0  . TARCEVA 150 MG tablet TAKE 1 TABLET BY MOUTH DAILY. TAKE ON AN EMPTY STOMACH 1 HOUR BEFORE MEALS OR 2 HOURS AFTER. 30 tablet 2  . torsemide (DEMADEX) 10 MG tablet Take 10 mg by mouth.    . Vitamin D, Ergocalciferol, (DRISDOL) 50000 UNITS CAPS capsule Take 50,000 Units by mouth every 30 (thirty) days. Reported on 10/09/2015     No current facility-administered medications for this visit.     SURGICAL HISTORY:  Past Surgical History:  Procedure Laterality Date  . ABDOMINAL HYSTERECTOMY    . APPENDECTOMY    . CHEST TUBE INSERTION  03/08/2012   Procedure: CHEST TUBE INSERTION;  Surgeon: Ivin Poot, MD;  Location: [facility];  Service: Thoracic;  Laterality: Right;  . CHOLECYSTECTOMY    . COLONOSCOPY N/A 04/29/2014   Procedure: COLONOSCOPY;  Surgeon: Daneil Dolin, MD;  [facility];  Service: Endoscopy;  Laterality: N/A;  1245pm  . ESOPHAGOGASTRODUODENOSCOPY N/A 04/29/2014   Procedure: ESOPHAGOGASTRODUODENOSCOPY (EGD);  Surgeon: Daneil Dolin, MD;  [facility];  Service: Endoscopy;  Laterality: N/A;  Venia Minks DILATION N/A 04/29/2014   Procedure: Venia Minks DILATION;  Surgeon: Daneil Dolin, MD;  [facility];  Service: Endoscopy;  Laterality: N/A;  . RESECTION OF APICAL BLEB  03/17/2012   Procedure: RESECTION OF APICAL BLEB;  Surgeon: Ivin Poot, MD;  Location[facility] OR;  Service: Thoracic;  Laterality: Right;  stapling of bleb  . SAVORY DILATION N/A 04/29/2014   Procedure: SAVORY DILATION;  Surgeon: Daneil Dolin, MD;  [facility];  Service: Endoscopy;  Laterality: N/A;  . TEE WITHOUT CARDIOVERSION N/A 11/15/2014   Procedure: TRANSESOPHAGEAL ECHOCARDIOGRAM (TEE);  Surgeon: Sueanne Margarita, MD;  [facility] END[facility]COPY;  Service: Cardiovascular;  Laterality: N/A;  . TUBAL LIGATION    . VIDEO ASSISTED THORACOSCOPY  03/17/2012   Procedure: VIDEO ASSISTED THORACOSCOPY;  Surgeon: Ivin Poot, MD;  Location[facility];  Service: Thoracic;   Laterality: Right;    REVIEW OF SYSTEMS:  Constitutional: positive for fatigue Eyes: negative Ears, nose, mouth, throat, and face: negative Respiratory: positive for dyspnea on exertion Cardiovascular: negative Gastrointestinal: negative Genitourinary:negative Integument/breast: negative Hematologic/lymphatic: negative Musculoskeletal:positive for arthralgias and muscle weakness Neurological: negative Behavioral/Psych: negative Endocrine: negative Allergic/Immunologic: negative   PHYSICAL EXAMINATION: General appearance: alert, cooperative, fatigued and no distress Head: Normocephalic, without obvious abnormality, atraumatic Neck: no adenopathy, no JVD, supple, symmetrical, trachea midline and thyroid not enlarged, symmetric, no tenderness/mass/nodules Lymph nodes: Cervical, supraclavicular, and axillary nodes normal. Resp: clear to auscultation bilaterally Back: symmetric, no curvature. ROM normal. No CVA tenderness. Cardio: regular rate and rhythm, S1, S2 normal, no murmur, click, rub or gallop GI: soft, non-tender; bowel sounds normal; no masses,  no organomegaly Extremities: edema 2+ edema in the right lower extremity Neurologic: Alert and oriented X 3, normal  strength and tone. Normal symmetric reflexes. Normal coordination and gait  ECOG PERFORMANCE STATUS: 2 - Symptomatic, <50% confined to bed  Blood pressure (!) 144/64, pulse 79, temperature 97.7 F (36.5 C), temperature source Oral, resp. rate 18, weight 95 lb 3.2 oz (43.2 kg), SpO2 99 %. Repeat blood pressure was 152/54  LABORATORY DATA: Lab Results  Component Value Date   WBC 3.7 (L) 06/10/2016   HGB 8.6 (L) 06/10/2016   HCT 27.7 (L) 06/10/2016   MCV 89.9 06/10/2016   PLT 245 06/10/2016      Chemistry      Component Value Date/Time   NA 141 05/17/2016 0849   K 4.1 05/17/2016 0849   CL 103 11/13/2014 1744   CO2 29 05/17/2016 0849   BUN 15.1 05/17/2016 0849   CREATININE 0.7 05/17/2016 0849      Component  Value Date/Time   CALCIUM 7.6 (L) 05/17/2016 0849   ALKPHOS 222 (H) 05/17/2016 0849   AST 15 05/17/2016 0849   ALT 10 05/17/2016 0849   BILITOT 0.43 05/17/2016 0849       RADIOGRAPHIC STUDIES: Ct Chest W Contrast  Result Date: 05/17/2016 CLINICAL DATA:  restaging right lung cancer diagnosed in 2013. Ongoing oral chemotherapy. Prior wedge resection in the right upper lobe and bow lobectomy and prior right pleurodesis. EXAM: CT CHEST, ABDOMEN, AND PELVIS WITH CONTRAST TECHNIQUE: Multidetector CT imaging of the chest, abdomen and pelvis was performed following the standard protocol during bolus administration of intravenous contrast. CONTRAST:  36m ISOVUE-300 IOPAMIDOL (ISOVUE-300) INJECTION 61% COMPARISON:  02/11/2016 FINDINGS: CT CHEST FINDINGS Cardiovascular: Coronary, aortic arch, and branch vessel atherosclerotic vascular disease. Mediastinum/Nodes: Indistinctly marginated right lower paratracheal node 1.1 cm in short axis on image 19/2, stable. Rightward deviation of the trachea and mediastinal structures due to volume loss in the right hemithorax which appears chronic. Lungs/Pleura: Postoperative findings in the right chest with wedge resections staple lines and pleural thickening. Emphysema. Pattern of scarring in the right lung is generally similar to prior. Peribronchovascular nodularity in the right middle lobe measures up to 10 mm in short axis on image 49/5, previously 9 mm. An abnormal nodule with focal interstitial thickening in the right middle lobe measuring 2.2 by 2.0 cm on image 67/5 previously measured 1.5 by 1.3 cm. A similar region in the right lower lobe measures 1.6 by 1.5 cm on image 78/5, previously 1.2 by 1.3 cm by my measurements. These last 2 lesions are concerning for low-grade adenocarcinoma. Worsened ground-glass and interstitial accentuation in the right lower lobe inferiorly. Small right pleural effusion is worsened. Somewhat spiculated 1.3 by 1.6 cm nodule in the left  upper lobe on image 60/5 previously measured 1.2 by 1.1 cm. In the left lower lobe with a 2.2 by 1.5 cm nodule on image 107/5 previously measured 1.5 by 1.1 cm. Medially in the left lower lobe on image 108/5, a pleural-based 2.1 by 1.5 cm nodule previously measured 2.0 by 1.1 cm. The Musculoskeletal: Thoracic kyphosis. CT ABDOMEN PELVIS FINDINGS Hepatobiliary: Cholecystectomy.  Otherwise unremarkable. Pancreas: Chronic calcific pancreatitis with scattered coarse calcifications, pancreatic atrophy, and underlying mildly dilated dorsal pancreatic duct. Spleen: Unremarkable Adrenals/Urinary Tract: 7 mm hypodense lesion of the right kidney lower pole is likely a cyst but technically too small to characterize. Small cyst of the left mid kidney. Adrenal glands unremarkable. Several small renal calculi are present, for example in the right mid kidney on image 82/6 and in the left kidney upper pole on image 77 of series 6. Stomach/Bowel:  Unremarkable Vascular/Lymphatic: Aortoiliac atherosclerotic vascular disease. Infrarenal abdominal aortic aneurysm with mural thrombus, 5 cm transverse dimension, previously 4.9 cm. Similar configuration to prior. Severe atherosclerotic narrowing in the left common iliac artery. This is also similar to prior. No pathologic adenopathy identified. Reproductive: Uterus absent.  Adnexa unremarkable. Other:  Low-grade diffuse subcutaneous edema. Musculoskeletal: Schmorl's node along the inferior endplate of L4. IMPRESSION: 1. Multiple enlarging pulmonary nodules compatible with progressive malignancy. 2. Atherosclerosis. The infrarenal abdominal aortic aneurysm measures 5 cm 5.0 cm in transverse dimension, previously 4.9 cm. Recommend followup by abdomen and pelvis CTA in 3-6 months, and vascular surgery referral/consultation if not already obtained. This recommendation follows ACR consensus guidelines: White Paper of the ACR Incidental Findings Committee II on Vascular Findings. J Am Coll  Radiol 2013; 10:789-794. 3. Stable mildly enlarged right lower paratracheal lymph node. 4. Stable appearance of chronic calcific pancreatitis. 5. Mild increase in the small right pleural effusion. This is against a background of pleural thickening likely related to prior pleurodesis. There is also some low-grade subcutaneous edema potentially reflecting mild third spacing of fluid. 6. 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 7. Emphysema. 8. Small bilateral nonobstructive renal calculi. Electronically Signed   By: Van Clines M.D.   On: 05/17/2016 11:25   Ct Abdomen Pelvis W Contrast  Result Date: 05/17/2016 CLINICAL DATA:  restaging right lung cancer diagnosed in 2013. Ongoing oral chemotherapy. Prior wedge resection in the right upper lobe and bow lobectomy and prior right pleurodesis. EXAM: CT CHEST, ABDOMEN, AND PELVIS WITH CONTRAST TECHNIQUE: Multidetector CT imaging of the chest, abdomen and pelvis was performed following the standard protocol during bolus administration of intravenous contrast. CONTRAST:  62m ISOVUE-300 IOPAMIDOL (ISOVUE-300) INJECTION 61% COMPARISON:  02/11/2016 FINDINGS: CT CHEST FINDINGS Cardiovascular: Coronary, aortic arch, and branch vessel atherosclerotic vascular disease. Mediastinum/Nodes: Indistinctly marginated right lower paratracheal node 1.1 cm in short axis on image 19/2, stable. Rightward deviation of the trachea and mediastinal structures due to volume loss in the right hemithorax which appears chronic. Lungs/Pleura: Postoperative findings in the right chest with wedge resections staple lines and pleural thickening. Emphysema. Pattern of scarring in the right lung is generally similar to prior. Peribronchovascular nodularity in the right middle lobe measures up to 10 mm in short axis on image 49/5, previously 9 mm. An abnormal nodule with focal interstitial thickening in the right middle lobe measuring 2.2 by 2.0 cm on image 67/5  previously measured 1.5 by 1.3 cm. A similar region in the right lower lobe measures 1.6 by 1.5 cm on image 78/5, previously 1.2 by 1.3 cm by my measurements. These last 2 lesions are concerning for low-grade adenocarcinoma. Worsened ground-glass and interstitial accentuation in the right lower lobe inferiorly. Small right pleural effusion is worsened. Somewhat spiculated 1.3 by 1.6 cm nodule in the left upper lobe on image 60/5 previously measured 1.2 by 1.1 cm. In the left lower lobe with a 2.2 by 1.5 cm nodule on image 107/5 previously measured 1.5 by 1.1 cm. Medially in the left lower lobe on image 108/5, a pleural-based 2.1 by 1.5 cm nodule previously measured 2.0 by 1.1 cm. The Musculoskeletal: Thoracic kyphosis. CT ABDOMEN PELVIS FINDINGS Hepatobiliary: Cholecystectomy.  Otherwise unremarkable. Pancreas: Chronic calcific pancreatitis with scattered coarse calcifications, pancreatic atrophy, and underlying mildly dilated dorsal pancreatic duct. Spleen: Unremarkable Adrenals/Urinary Tract: 7 mm hypodense lesion of the right kidney lower pole is likely a cyst but technically too small to characterize. Small cyst of the left mid kidney. Adrenal  glands unremarkable. Several small renal calculi are present, for example in the right mid kidney on image 82/6 and in the left kidney upper pole on image 77 of series 6. Stomach/Bowel: Unremarkable Vascular/Lymphatic: Aortoiliac atherosclerotic vascular disease. Infrarenal abdominal aortic aneurysm with mural thrombus, 5 cm transverse dimension, previously 4.9 cm. Similar configuration to prior. Severe atherosclerotic narrowing in the left common iliac artery. This is also similar to prior. No pathologic adenopathy identified. Reproductive: Uterus absent.  Adnexa unremarkable. Other:  Low-grade diffuse subcutaneous edema. Musculoskeletal: Schmorl's node along the inferior endplate of L4. IMPRESSION: 1. Multiple enlarging pulmonary nodules compatible with progressive  malignancy. 2. Atherosclerosis. The infrarenal abdominal aortic aneurysm measures 5 cm 5.0 cm in transverse dimension, previously 4.9 cm. Recommend followup by abdomen and pelvis CTA in 3-6 months, and vascular surgery referral/consultation if not already obtained. This recommendation follows ACR consensus guidelines: White Paper of the ACR Incidental Findings Committee II on Vascular Findings. J Am Coll Radiol 2013; 10:789-794. 3. Stable mildly enlarged right lower paratracheal lymph node. 4. Stable appearance of chronic calcific pancreatitis. 5. Mild increase in the small right pleural effusion. This is against a background of pleural thickening likely related to prior pleurodesis. There is also some low-grade subcutaneous edema potentially reflecting mild third spacing of fluid. 6. 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 7. Emphysema. 8. Small bilateral nonobstructive renal calculi. Electronically Signed   By: Van Clines M.D.   On: 05/17/2016 11:25    ASSESSMENT AND PLAN:  This is a very pleasant 76 years old white female with recurrent non-small cell lung cancer, adenocarcinoma with positive EGFR mutation in exon 21. She has been on treatment with Tarceva for 22 months but unfortunately she has evidence for disease progression. I had a lengthy discussion with the patient today about her condition and treatment options. I explained to the patient that she has incurable condition and on the treatment will be of palliative nature. I discussed with her the option of palliative care versus consideration of treatment with Tagrisso for the potential EGFR resistant tumor versus consideration of treatment with chemotherapy which I don't think the patient would be able to tolerate. I discussed with the patient the city options and the adverse effects. She is very interested in treatment with Tagrisso. I will send her prescription to [facility] outpatient pharmacy. For the  anemia of neoplastic disease, we will continue to monitor her hemoglobin and hematocrit closely and consider the patient for transfusion as needed. For hypertension, I advised the patient to continue with her current blood pressure medication and to monitor it closely at home. I will see her back for follow-up visit in 3 weeks for evaluation and management of any adverse effect of her treatment. She was advised to call immediately if she has any concerning symptoms in the interval. The patient voices understanding of current disease status and treatment options and is in agreement with the current care plan.  All questions were answered. The patient knows to call the clinic with any problems, questions or concerns. We can certainly see the patient much sooner if necessary.  Disclaimer: This note was dictated with voice recognition software. Similar sounding words can inadvertently be transcribed and may not be corrected upon review.

## 2016-06-14 ENCOUNTER — Ambulatory Visit: Payer: PPO | Admitting: Surgery

## 2016-06-22 DIAGNOSIS — J449 Chronic obstructive pulmonary disease, unspecified: Secondary | ICD-10-CM | POA: Diagnosis not present

## 2016-06-22 DIAGNOSIS — J439 Emphysema, unspecified: Secondary | ICD-10-CM | POA: Diagnosis not present

## 2016-06-25 ENCOUNTER — Telehealth: Payer: Self-pay | Admitting: Pharmacist

## 2016-06-25 NOTE — Telephone Encounter (Signed)
Oral Chemotherapy Pharmacist Encounter  Received notification from [place] that patients copayment for Tagrisso is very high. Contacted patient and discussed copayment options. There are currently no foundation grants available for patient's diagnosis. Received permission from patient to seek manufacturer assistance from [employer]. [employer] application started. Patient will come to [facility] sometime the week of 6/88/64 to complete application and bring financial documentation. Once application is complete, we will fax to [employer].  This encounter will continue to be updated until final determination.  Oral Oncology Clinic will continue to follow.   Johny Drilling, PharmD, BCPS, BCOP 06/25/2016  2:57 PM Oral Oncology Clinic [phone]

## 2016-06-30 NOTE — Telephone Encounter (Signed)
Oral Chemotherapy Pharmacist Encounter  Received a call from Buena Irish (pt's daughter).  She will bring her mom, income information and pharmacy records to [facility] on 07/01/16 @ 11:30am to meet with Denyse Amass (oral chemo navigator) and complete application for manufacturer assistance for Tagrisso.    Raul Del, PharmD, BCPS, [facility] [phone]

## 2016-07-01 ENCOUNTER — Telehealth: Payer: Self-pay | Admitting: Pharmacist

## 2016-07-01 NOTE — Telephone Encounter (Signed)
Oral Chemotherapy Pharmacist Encounter  I met patient and daughter in the lobby of [facility] to compete AZ&me application for [employer]. Application faxed to [employer] at [phone]  This encounter will continue to be updated until final determination.  Oral Oncology Clinic will continue to follow.   Johny Drilling, PharmD, BCPS, BCOP 07/01/2016  12:51 PM Oral Oncology Clinic [phone]

## 2016-07-01 NOTE — Telephone Encounter (Signed)
Oral Chemotherapy Pharmacist Encounter   I spoke with patient for overview of new oral chemotherapy medication: Tagrisso. Pt is doing well. The prescription was sent to the [facility], copayment is prohibitively expensive, application process for [employer]me started and documented in a separate encounter.   Counseled patient on administration, dosing, side effects, safe handling, and monitoring. Patient will take 1 tablet ('80mg'$  total) by mouth daily without regard to food.  Side effects include but not limited to: diarrhea, nausea, vomiting, fatigue, rash, and dry skin.  [name] voiced understanding and appreciation.   All questions answered.  Patient knows to call the office with any questions or concerns. Oral Oncology Clinic will continue to follow.   Thank you,  [name], PharmD, BCPS, BCOP 07/01/2016  12:55 PM Oral Oncology Clinic [phone]

## 2016-07-06 ENCOUNTER — Other Ambulatory Visit (HOSPITAL_BASED_OUTPATIENT_CLINIC_OR_DEPARTMENT_OTHER): Payer: PPO

## 2016-07-06 ENCOUNTER — Encounter: Payer: Self-pay | Admitting: Internal Medicine

## 2016-07-06 ENCOUNTER — Ambulatory Visit (HOSPITAL_BASED_OUTPATIENT_CLINIC_OR_DEPARTMENT_OTHER): Payer: PPO | Admitting: Internal Medicine

## 2016-07-06 ENCOUNTER — Telehealth: Payer: Self-pay | Admitting: Internal Medicine

## 2016-07-06 VITALS — BP 129/76 | HR 86 | Temp 97.7°F | Resp 18 | Ht 64.0 in | Wt 97.4 lb

## 2016-07-06 DIAGNOSIS — C3411 Malignant neoplasm of upper lobe, right bronchus or lung: Secondary | ICD-10-CM

## 2016-07-06 DIAGNOSIS — D638 Anemia in other chronic diseases classified elsewhere: Secondary | ICD-10-CM

## 2016-07-06 DIAGNOSIS — Z5111 Encounter for antineoplastic chemotherapy: Secondary | ICD-10-CM

## 2016-07-06 DIAGNOSIS — R1011 Right upper quadrant pain: Secondary | ICD-10-CM | POA: Diagnosis not present

## 2016-07-06 DIAGNOSIS — J449 Chronic obstructive pulmonary disease, unspecified: Secondary | ICD-10-CM

## 2016-07-06 LAB — COMPREHENSIVE METABOLIC PANEL
ALBUMIN: 2 g/dL — AB (ref 3.5–5.0)
ALK PHOS: 323 U/L — AB (ref 40–150)
ALT: 15 U/L (ref 0–55)
AST: 18 U/L (ref 5–34)
Anion Gap: 9 mEq/L (ref 3–11)
BUN: 13.8 mg/dL (ref 7.0–26.0)
CALCIUM: 7.6 mg/dL — AB (ref 8.4–10.4)
CO2: 23 mEq/L (ref 22–29)
CREATININE: 0.7 mg/dL (ref 0.6–1.1)
Chloride: 108 mEq/L (ref 98–109)
EGFR: 86 mL/min/{1.73_m2} — ABNORMAL LOW (ref >90)
Glucose: 104 mg/dl (ref 70–140)
Potassium: 3.5 mEq/L (ref 3.5–5.1)
Sodium: 139 mEq/L (ref 136–145)
TOTAL PROTEIN: 5 g/dL — AB (ref 6.4–8.3)
Total Bilirubin: 0.31 mg/dL (ref 0.20–1.20)

## 2016-07-06 LAB — CBC WITH DIFFERENTIAL/PLATELET
BASO%: 0.3 % (ref 0.0–2.0)
BASOS ABS: 0 10*3/uL (ref 0.0–0.1)
EOS%: 8.7 % — AB (ref 0.0–7.0)
Eosinophils Absolute: 0.3 10*3/uL (ref 0.0–0.5)
HEMATOCRIT: 27.2 % — AB (ref 34.8–46.6)
HEMOGLOBIN: 8.5 g/dL — AB (ref 11.6–15.9)
LYMPH#: 0.5 10*3/uL — AB (ref 0.9–3.3)
LYMPH%: 17.7 % (ref 14.0–49.7)
MCH: 28.5 pg (ref 25.1–34.0)
MCHC: 31.3 g/dL — ABNORMAL LOW (ref 31.5–36.0)
MCV: 91.3 fL (ref 79.5–101.0)
MONO#: 0.3 10*3/uL (ref 0.1–0.9)
MONO%: 11.3 % (ref 0.0–14.0)
NEUT%: 62 % (ref 38.4–76.8)
NEUTROS ABS: 1.9 10*3/uL (ref 1.5–6.5)
Platelets: 228 10*3/uL (ref 145–400)
RBC: 2.98 10*6/uL — ABNORMAL LOW (ref 3.70–5.45)
RDW: 16.6 % — AB (ref 11.2–14.5)
WBC: 3 10*3/uL — ABNORMAL LOW (ref 3.9–10.3)

## 2016-07-06 MED ORDER — TRAMADOL HCL 50 MG PO TABS: 50.0000 mg | ORAL_TABLET | Freq: Two times a day (BID) | ORAL | 0 refills | Status: AC | PRN

## 2016-07-06 NOTE — Progress Notes (Signed)
[place] [phone]   Fax:([phone]) [place], MD [address] [place] [address]  DIAGNOSIS: Recurrent non-small cell lung cancer, adenocarcinoma with positive EGFR mutation in exon 21 initially diagnosed in December 2013.  PRIOR THERAPY: 1) Palliative radiotherapy to the enlarging right upper lobe lung mass under the care of Dr. Pablo Ledger completed on 01/06/2015. 2) Tarceva 150 mg by mouth daily status post 22 months of treatment discontinued secondary to disease progression.  CURRENT THERAPY: Tagrisso 80 mg by mouth daily.she is expected to start the first dose of this treatment in the next few days.   INTERVAL HISTORY: [name] 76 y.o. female returns to the clinic today for follow-up visit accompanied by her 2 daughters Jeani Hawking and [place]. The patient is feeling fine today was no specific complaints except for shortness breath at baseline and increased with exertion and she is currently on home oxygen. She also has intermittent right upper quadrant abdominal pain. She takes Tylenol with mild improvement and she is requesting a stronger pain medication. She is allergic to codeine. She denied having any chest pain but continues to have mild cough with no hemoptysis. She has no fever or chills. She has some gait imbalance and a fall 3 weeks ago secondary to weakness in her lower extremities. She has not received her new treatment with Tagrisso yet. She is currently in the process of receiving financial support for her copayment. She is today for evaluation and repeat blood work.   MEDICAL HISTORY: Past Medical History:  Diagnosis Date  . AAA (abdominal aortic aneurysm) ([place])   . Anemia of chronic disease 11/14/2014  . Chronic kidney disease   . COPD (chronic obstructive pulmonary disease) ([place])   . Hypertension   . Lung cancer ([place])   . Pernicious anemia   . Primary cancer of right upper lobe of lung ([place])  05/10/2012  . Stroke ([place])   . Thyroid disease     ALLERGIES:  is allergic to codeine; penicillins; and pravastatin sodium.  MEDICATIONS:  Current Outpatient Prescriptions  Medication Sig Dispense Refill  . acetaminophen (TYLENOL) 500 MG tablet Take 500-1,000 mg by mouth every 6 (six) hours as needed for moderate pain.     Marland Kitchen albuterol (PROVENTIL) (2.5 MG/3ML) 0.083% nebulizer solution Take 2.5 mg by nebulization every 4 (four) hours. Or Four times daily    . ALPRAZolam (XANAX) 1 MG tablet Take 1 mg by mouth 4 (four) times daily as needed. For anxiety and/or sleep    . amLODipine (NORVASC) 2.5 MG tablet Take 2.5 mg by mouth daily.  11  . aspirin EC 81 MG EC tablet Take 1 tablet (81 mg total) by mouth daily.    . clindamycin (CLEOCIN T) 1 % external solution Apply topically 2 (two) times daily. (Patient taking differently: Apply 1 application topically as needed. ) 30 mL 1  . Cyanocobalamin (VITAMIN B-12) 1000 MCG SUBL Place 1 tablet under the tongue daily.    . ferrous sulfate 325 (65 FE) MG tablet Take 325 mg by mouth 3 (three) times daily with meals.    . Fluticasone-Salmeterol (ADVAIR) 250-50 MCG/DOSE AEPB Inhale 1 puff into the lungs every 12 (twelve) hours.    Marland Kitchen guaiFENesin (MUCINEX) 600 MG 12 hr tablet Take 600 mg by mouth daily as needed for cough.     . levothyroxine (SYNTHROID, LEVOTHROID) 100 MCG tablet Take 100 mcg by mouth daily before breakfast.     .  loperamide (IMODIUM) 2 MG capsule Take 2 mg by mouth as needed for diarrhea or loose stools.    Marland Kitchen loratadine (CLARITIN) 10 MG tablet Take 10 mg by mouth daily as needed for allergies.     . mirtazapine (REMERON) 7.5 MG tablet Take 7.5 mg by mouth at bedtime.  12  . NON FORMULARY OXYGEN      24/7    . osimertinib mesylate (TAGRISSO) 80 MG tablet Take 1 tablet (80 mg total) by mouth daily. 30 tablet 2  . potassium chloride SA (K-DUR,KLOR-CON) 20 MEQ tablet Take 1 tablet (20 mEq total) by mouth daily. 7 tablet 0  . torsemide (DEMADEX)  10 MG tablet Take 10 mg by mouth.    . Vitamin D, Ergocalciferol, (DRISDOL) 50000 UNITS CAPS capsule Take 50,000 Units by mouth every 30 (thirty) days. Reported on 10/09/2015     No current facility-administered medications for this visit.     SURGICAL HISTORY:  Past Surgical History:  Procedure Laterality Date  . ABDOMINAL HYSTERECTOMY    . APPENDECTOMY    . CHEST TUBE INSERTION  03/08/2012   Procedure: CHEST TUBE INSERTION;  Surgeon: Ivin Poot, MD;  Location: [facility];  Service: Thoracic;  Laterality: Right;  . CHOLECYSTECTOMY    . COLONOSCOPY N/A 04/29/2014   Procedure: COLONOSCOPY;  Surgeon: Daneil Dolin, MD;  [facility];  Service: Endoscopy;  Laterality: N/A;  1245pm  . ESOPHAGOGASTRODUODENOSCOPY N/A 04/29/2014   Procedure: ESOPHAGOGASTRODUODENOSCOPY (EGD);  Surgeon: Daneil Dolin, MD;  [facility];  Service: Endoscopy;  Laterality: N/A;  Venia Minks DILATION N/A 04/29/2014   Procedure: Venia Minks DILATION;  Surgeon: Daneil Dolin, MD;  [facility];  Service: Endoscopy;  Laterality: N/A;  . RESECTION OF APICAL BLEB  03/17/2012   Procedure: RESECTION OF APICAL BLEB;  Surgeon: Ivin Poot, MD;  Location: [facility] OR;  Service: Thoracic;  Laterality: Right;  stapling of bleb  . SAVORY DILATION N/A 04/29/2014   Procedure: SAVORY DILATION;  Surgeon: Daneil Dolin, MD;  [facility];  Service: Endoscopy;  Laterality: N/A;  . TEE WITHOUT CARDIOVERSION N/A 11/15/2014   Procedure: TRANSESOPHAGEAL ECHOCARDIOGRAM (TEE);  Surgeon: Sueanne Margarita, MD;  Location: [facility] ENDOSCOPY;  Service: Cardiovascular;  Laterality: N/A;  . TUBAL LIGATION    . VIDEO ASSISTED THORACOSCOPY  03/17/2012   Procedure: VIDEO ASSISTED THORACOSCOPY;  Surgeon: Ivin Poot, MD;  Location: [facility] OR;  Service: Thoracic;  Laterality: Right;    REVIEW OF SYSTEMS:  A comprehensive review of systems was negative except for: Constitutional: positive for fatigue Respiratory: positive for  cough and dyspnea on exertion Musculoskeletal: positive for muscle weakness   PHYSICAL EXAMINATION: General appearance: alert, cooperative, fatigued and no distress Head: Normocephalic, without obvious abnormality, atraumatic Neck: no adenopathy, no JVD, supple, symmetrical, trachea midline and thyroid not enlarged, symmetric, no tenderness/mass/nodules Lymph nodes: Cervical, supraclavicular, and axillary nodes normal. Resp: clear to auscultation bilaterally Back: symmetric, no curvature. ROM normal. No CVA tenderness. Cardio: regular rate and rhythm, S1, S2 normal, no murmur, click, rub or gallop GI: soft, non-tender; bowel sounds normal; no masses,  no organomegaly Extremities: extremities normal, atraumatic, no cyanosis or edema  ECOG PERFORMANCE STATUS: 2 - Symptomatic, <50% confined to bed  Blood pressure 129/76, pulse 86, temperature 97.7 F (36.5 C), temperature source Oral, resp. rate 18, height _0  (1.626 m), weight 97 lb 6.4 oz (44.2 kg), SpO2 100 %. Repeat blood pressure was 152/54  LABORATORY DATA:  Lab Results  Component Value Date   WBC 3.0 (L) 07/06/2016   HGB 8.5 (L) 07/06/2016   HCT 27.2 (L) 07/06/2016   MCV 91.3 07/06/2016   PLT 228 07/06/2016      Chemistry      Component Value Date/Time   NA 139 07/06/2016 0942   K 3.5 07/06/2016 0942   CL 103 11/13/2014 1744   CO2 23 07/06/2016 0942   BUN 13.8 07/06/2016 0942   CREATININE 0.7 07/06/2016 0942      Component Value Date/Time   CALCIUM 7.6 (L) 07/06/2016 0942   ALKPHOS 323 (H) 07/06/2016 0942   AST 18 07/06/2016 0942   ALT 15 07/06/2016 0942   BILITOT 0.31 07/06/2016 0942       RADIOGRAPHIC STUDIES: Dg Thoracic Spine W/swimmers  Result Date: 06/10/2016 CLINICAL DATA:  Fell onto the back, pain below the shoulders EXAM: THORACIC SPINE - 3 VIEWS COMPARISON:  05/17/2016 FINDINGS: Mild kyphoscoliosis of the thoracic spine. Osteopenia limits evaluation. Thoracic alignment grossly stable. Postsurgical  changes in the right upper lung with pleural and parenchymal scarring. Surgical clips in the right upper quadrant. Old right-sided rib fractures. IMPRESSION: Kyphoscoliosis and osteopenia limit evaluation for fracture. CT is suggested if there is continued concern for thoracic fracture. Electronically Signed   By: Donavan Foil M.D.   On: 06/10/2016 18:42    ASSESSMENT AND PLAN:  This is a very pleasant 76 years old white female with recurrent non-small cell lung cancer, adenocarcinoma with positive EGFR mutation in exon 21 status post treatment with Tarceva for 22 months discontinued on 06/22/2016 secondary to disease progression. The patient is still awaiting the approval for her treatment with Tagrisso. I recommended for her to start the drug once she receives it. For the right upper quadrant pain, I started the patient on tramadol 50 mg by mouth every 12 as needed. I will see her back for follow-up visit in 3 weeks for evaluation and repeat blood work for close monitoring of her treatment with Tagrisso. She was advised to call immediately if she has any concerning symptoms in the interval. The patient voices understanding of current disease status and treatment options and is in agreement with the current care plan.  All questions were answered. The patient knows to call the clinic with any problems, questions or concerns. We can certainly see the patient much sooner if necessary. I spent 10 minutes counseling the patient face to face. The total time spent in the appointment was 15 minutes.  Disclaimer: This note was dictated with voice recognition software. Similar sounding words can inadvertently be transcribed and may not be corrected upon review.

## 2016-07-06 NOTE — Telephone Encounter (Signed)
Appointments scheduled per 4.3.18 LOS. Patient given AVS report and calendars with future scheduled appointments.  °

## 2016-07-07 NOTE — Telephone Encounter (Signed)
Oral Chemotherapy Pharmacist Encounter  Received notification from [employer] that patient has been successfully enrolled into their patient assistance program to receive Tagrisso at $0 cost through 04/04/17 from the manufacturer. Patient notified. Her 1st cycle will arrive in 3-5 business days. She will call [employer] at [phone] when she has 10 pills left each month to order her next cycle. All questions answered. Patient expressed understanding and appreciation. She knows to call the clinic with questions or concerns.  Oral Oncology Clinic will continue to follow.  Johny Drilling, PharmD, BCPS, BCOP 07/07/2016  9:51 AM Oral Oncology Clinic [phone]

## 2016-07-08 ENCOUNTER — Encounter: Payer: Self-pay | Admitting: Surgery

## 2016-07-09 ENCOUNTER — Ambulatory Visit (HOSPITAL_COMMUNITY)
Admission: RE | Admit: 2016-07-09 | Discharge: 2016-07-09 | Disposition: A | Discharge status: Home / Self Care | Payer: PPO | Source: Ambulatory Visit | Attending: Internal Medicine | Admitting: Internal Medicine

## 2016-07-09 ENCOUNTER — Other Ambulatory Visit (HOSPITAL_COMMUNITY): Payer: Self-pay | Admitting: Internal Medicine

## 2016-07-09 DIAGNOSIS — R0600 Dyspnea, unspecified: Secondary | ICD-10-CM | POA: Diagnosis not present

## 2016-07-09 DIAGNOSIS — C3490 Malignant neoplasm of unspecified part of unspecified bronchus or lung: Secondary | ICD-10-CM | POA: Diagnosis not present

## 2016-07-09 DIAGNOSIS — I517 Cardiomegaly: Secondary | ICD-10-CM | POA: Insufficient documentation

## 2016-07-09 DIAGNOSIS — Z681 Body mass index (BMI) 19 or less, adult: Secondary | ICD-10-CM | POA: Diagnosis not present

## 2016-07-09 DIAGNOSIS — I63531 Cerebral infarction due to unspecified occlusion or stenosis of right posterior cerebral artery: Secondary | ICD-10-CM | POA: Diagnosis not present

## 2016-07-09 DIAGNOSIS — S299XXA Unspecified injury of thorax, initial encounter: Secondary | ICD-10-CM | POA: Diagnosis not present

## 2016-07-09 DIAGNOSIS — Z1389 Encounter for screening for other disorder: Secondary | ICD-10-CM | POA: Diagnosis not present

## 2016-07-09 DIAGNOSIS — J9610 Chronic respiratory failure, unspecified whether with hypoxia or hypercapnia: Secondary | ICD-10-CM | POA: Diagnosis not present

## 2016-07-09 DIAGNOSIS — I714 Abdominal aortic aneurysm, without rupture: Secondary | ICD-10-CM | POA: Diagnosis not present

## 2016-07-09 DIAGNOSIS — J449 Chronic obstructive pulmonary disease, unspecified: Secondary | ICD-10-CM | POA: Diagnosis not present

## 2016-07-09 DIAGNOSIS — R918 Other nonspecific abnormal finding of lung field: Secondary | ICD-10-CM | POA: Insufficient documentation

## 2016-07-09 DIAGNOSIS — M549 Dorsalgia, unspecified: Secondary | ICD-10-CM | POA: Diagnosis not present

## 2016-07-15 DIAGNOSIS — R634 Abnormal weight loss: Secondary | ICD-10-CM | POA: Diagnosis not present

## 2016-07-15 DIAGNOSIS — J9611 Chronic respiratory failure with hypoxia: Secondary | ICD-10-CM | POA: Diagnosis not present

## 2016-07-15 DIAGNOSIS — J449 Chronic obstructive pulmonary disease, unspecified: Secondary | ICD-10-CM | POA: Diagnosis not present

## 2016-07-15 DIAGNOSIS — C3490 Malignant neoplasm of unspecified part of unspecified bronchus or lung: Secondary | ICD-10-CM | POA: Diagnosis not present

## 2016-07-19 ENCOUNTER — Encounter: Payer: Self-pay | Admitting: Surgery

## 2016-07-19 ENCOUNTER — Ambulatory Visit (INDEPENDENT_AMBULATORY_CARE_PROVIDER_SITE_OTHER): Payer: PPO | Admitting: Surgery

## 2016-07-19 VITALS — BP 126/70 | HR 85 | Temp 97.1°F | Resp 20 | Ht 64.0 in | Wt 95.0 lb

## 2016-07-19 DIAGNOSIS — I714 Abdominal aortic aneurysm, without rupture, unspecified: Secondary | ICD-10-CM

## 2016-07-19 NOTE — Progress Notes (Signed)
[facility]  Patient name: [name] MRN: [id number] DOB: 10/18/1940 Sex: female   REASON FOR VISIT:   f/u AAA  HISOTRY OF PRESENT ILLNESS:   [name] is a 76 y.o. female who is back today for follow-up of her abdominal aortic aneurysm.  This was detected on CT scan during treatment for recurrent non-small cell lung cancer.  She has a history of palliative radiation therapy to a right upper lobe mass.  Her aneurysm has been stable at 4.8 cm.  She also has an occluded left iliac artery.  The patient reports no symptoms of left leg ischemia or claudication.  She denies any abdominal pain.  She has recently fallen and is recovering from that.  She is also on a new regimen for chemotherapy.    PAST MEDICAL HISTORY:   Past Medical History:  Diagnosis Date  . AAA (abdominal aortic aneurysm) ([place])   . Anemia of chronic disease 11/14/2014  . Chronic kidney disease   . COPD (chronic obstructive pulmonary disease) ([place])   . Hypertension   . Lung cancer ([place])   . Pernicious anemia   . Primary cancer of right upper lobe of lung ([place]) 05/10/2012  . Stroke ([place])   . Thyroid disease      FAMILY HISTORY:   Family History  Problem Relation Age of Onset  . Cancer Mother   . Heart failure Brother   . Heart disease Brother     before age 75  . Colon cancer Neg Hx     SOCIAL HISTORY:   Social History  Substance Use Topics  . Smoking status: Former Smoker    Types: Cigarettes    Quit date: 04/05/2006  . Smokeless tobacco: Former Systems developer    Quit date: 03/07/2006  . Alcohol use No     ALLERGIES:   Allergies  Allergen Reactions  . Codeine Nausea And Vomiting    Projectile vomiting  . Penicillins Rash    Tolerates Rocephine  . Doxycycline Nausea And Vomiting  . Pravastatin Sodium Other (See Comments)    Muscle cramps     CURRENT MEDICATIONS:   Current Outpatient Prescriptions  Medication Sig Dispense Refill    . acetaminophen (TYLENOL) 500 MG tablet Take 500-1,000 mg by mouth every 6 (six) hours as needed for moderate pain.     Marland Kitchen albuterol (PROVENTIL) (2.5 MG/3ML) 0.083% nebulizer solution Take 2.5 mg by nebulization every 4 (four) hours. Or Four times daily    . ALPRAZolam (XANAX) 1 MG tablet Take 1 mg by mouth 4 (four) times daily as needed. For anxiety and/or sleep    . amLODipine (NORVASC) 2.5 MG tablet Take 2.5 mg by mouth daily.  11  . azithromycin (ZITHROMAX) 250 MG tablet Take by mouth daily.    . clindamycin (CLEOCIN T) 1 % external solution Apply topically 2 (two) times daily. 30 mL 1  . ferrous sulfate 325 (65 FE) MG tablet Take 325 mg by mouth 3 (three) times daily with meals.    . Fluticasone-Salmeterol (ADVAIR) 250-50 MCG/DOSE AEPB Inhale 1 puff into the lungs every 12 (twelve) hours.    Marland Kitchen guaiFENesin (MUCINEX) 600 MG 12 hr tablet Take 600 mg by mouth daily as needed for cough.     . levothyroxine (SYNTHROID, LEVOTHROID) 100 MCG tablet Take 100 mcg by mouth daily before breakfast.     . loperamide (IMODIUM) 2 MG capsule Take 2 mg by mouth as needed for diarrhea or loose stools.    Marland Kitchen  loratadine (CLARITIN) 10 MG tablet Take 10 mg by mouth daily as needed for allergies.     . mirtazapine (REMERON) 7.5 MG tablet Take 7.5 mg by mouth at bedtime.  12  . NON FORMULARY OXYGEN      24/7    . osimertinib mesylate (TAGRISSO) 80 MG tablet Take 1 tablet (80 mg total) by mouth daily. 30 tablet 2  . potassium chloride SA (K-DUR,KLOR-CON) 20 MEQ tablet Take 1 tablet (20 mEq total) by mouth daily. (Patient not taking: Reported on 07/19/2016) 7 tablet 0  . torsemide (DEMADEX) 10 MG tablet Take 10 mg by mouth.    . traMADol (ULTRAM) 50 MG tablet Take 1 tablet (50 mg total) by mouth every 12 (twelve) hours as needed. (Patient not taking: Reported on 07/19/2016) 30 tablet 0   No current facility-administered medications for this visit.     REVIEW OF SYSTEMS:   '[X]'$  denotes positive finding, '[ ]'$  denotes  negative finding Cardiac  Comments:  Chest pain or chest pressure:    Shortness of breath upon exertion:    Short of breath when lying flat:    Irregular heart rhythm:        Vascular    Pain in calf, thigh, or hip brought on by ambulation:    Pain in feet at night that wakes you up from your sleep:     Blood clot in your veins:    Leg swelling:         Pulmonary    Oxygen at home:    Productive cough:     Wheezing:         Neurologic    Sudden weakness in arms or legs:     Sudden numbness in arms or legs:     Sudden onset of difficulty speaking or slurred speech:    Temporary loss of vision in one eye:     Problems with dizziness:         Gastrointestinal    Blood in stool:     Vomited blood:         Genitourinary    Burning when urinating:     Blood in urine:        Psychiatric    Major depression:         Hematologic    Bleeding problems:    Problems with blood clotting too easily:        Skin    Rashes or ulcers:        Constitutional    Fever or chills:      PHYSICAL EXAM:   Vitals:   07/19/16 1406  BP: 126/70  Pulse: 85  Resp: 20  Temp: 97.1 F (36.2 C)  TempSrc: Oral  SpO2: 99%  Weight: 95 lb (43.1 kg)  Height: '5\' 4"'$  (1.626 m)    GENERAL: The patient is a well-nourished female, in no acute distress. The vital signs are documented above. CARDIAC: There is a regular rate and rhythm.  PULMONARY: Non-labored respirations  MUSCULOSKELETAL: There are no major deformities or cyanosis. NEUROLOGIC: No focal weakness or paresthesias are detected. SKIN: There are no ulcers or rashes noted. PSYCHIATRIC: The patient has a normal affect.  STUDIES:   I have reviewed her CTA with the following findinggs: 1. Multiple enlarging pulmonary nodules compatible with progressive malignancy. 2. Atherosclerosis. The infrarenal abdominal aortic aneurysm measures 5 cm 5.0 cm in transverse dimension, previously 4.9 cm. Recommend followup by abdomen and pelvis  CTA in 3-6 months, and  vascular surgery referral/consultation if not already obtained. This recommendation follows ACR consensus guidelines: White Paper of the ACR Incidental Findings Committee II on Vascular Findings. J Am Coll Radiol 2013; 10:789-794. 3. Stable mildly enlarged right lower paratracheal lymph node. 4. Stable appearance of chronic calcific pancreatitis. 5. Mild increase in the small right pleural effusion. This is against a background of pleural thickening likely related to prior pleurodesis. There is also some low-grade subcutaneous edema potentially reflecting mild third spacing of fluid. 6. 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 7. Emphysema. 8. Small bilateral nonobstructive renal calculi.   MEDICAL ISSUES:   AAA:  Based on the patient's multiple comorbidities and overall health as well as prognosis, I would not recommend intervention on her aneurysm at this time.  It measures approximately 5 cm.  She would require somewhat of a complex endovascular repair because of her occluded iliac artery.  I'm not sure she would tolerate this at this time.  I will follow up with her again in 6 months.    Annamarie Major, MD [facility] [phone] Pager [phone]

## 2016-07-23 DIAGNOSIS — J439 Emphysema, unspecified: Secondary | ICD-10-CM | POA: Diagnosis not present

## 2016-07-23 DIAGNOSIS — J449 Chronic obstructive pulmonary disease, unspecified: Secondary | ICD-10-CM | POA: Diagnosis not present

## 2016-07-27 ENCOUNTER — Ambulatory Visit (HOSPITAL_BASED_OUTPATIENT_CLINIC_OR_DEPARTMENT_OTHER): Payer: PPO | Admitting: Internal Medicine

## 2016-07-27 ENCOUNTER — Encounter: Payer: Self-pay | Admitting: Internal Medicine

## 2016-07-27 ENCOUNTER — Other Ambulatory Visit (HOSPITAL_BASED_OUTPATIENT_CLINIC_OR_DEPARTMENT_OTHER): Payer: PPO

## 2016-07-27 ENCOUNTER — Telehealth: Payer: Self-pay | Admitting: Internal Medicine

## 2016-07-27 VITALS — BP 112/62 | HR 84 | Temp 97.7°F | Resp 18 | Ht 64.0 in | Wt 85.9 lb

## 2016-07-27 DIAGNOSIS — C3411 Malignant neoplasm of upper lobe, right bronchus or lung: Secondary | ICD-10-CM

## 2016-07-27 DIAGNOSIS — Z5111 Encounter for antineoplastic chemotherapy: Secondary | ICD-10-CM

## 2016-07-27 DIAGNOSIS — I10 Essential (primary) hypertension: Secondary | ICD-10-CM | POA: Diagnosis not present

## 2016-07-27 DIAGNOSIS — D649 Anemia, unspecified: Secondary | ICD-10-CM | POA: Diagnosis not present

## 2016-07-27 DIAGNOSIS — D638 Anemia in other chronic diseases classified elsewhere: Secondary | ICD-10-CM

## 2016-07-27 LAB — CBC WITH DIFFERENTIAL/PLATELET
BASO%: 0.7 % (ref 0.0–2.0)
Basophils Absolute: 0 10*3/uL (ref 0.0–0.1)
EOS ABS: 0.2 10*3/uL (ref 0.0–0.5)
EOS%: 4.7 % (ref 0.0–7.0)
HCT: 25.4 % — ABNORMAL LOW (ref 34.8–46.6)
HEMOGLOBIN: 8.2 g/dL — AB (ref 11.6–15.9)
LYMPH%: 14.1 % (ref 14.0–49.7)
MCH: 29 pg (ref 25.1–34.0)
MCHC: 32.3 g/dL (ref 31.5–36.0)
MCV: 90 fL (ref 79.5–101.0)
MONO#: 0.3 10*3/uL (ref 0.1–0.9)
MONO%: 9.3 % (ref 0.0–14.0)
NEUT%: 71.2 % (ref 38.4–76.8)
NEUTROS ABS: 2.3 10*3/uL (ref 1.5–6.5)
Platelets: 238 10*3/uL (ref 145–400)
RBC: 2.82 10*6/uL — ABNORMAL LOW (ref 3.70–5.45)
RDW: 15.2 % — AB (ref 11.2–14.5)
WBC: 3.2 10*3/uL — AB (ref 3.9–10.3)
lymph#: 0.4 10*3/uL — ABNORMAL LOW (ref 0.9–3.3)

## 2016-07-27 LAB — COMPREHENSIVE METABOLIC PANEL
ALBUMIN: 1.7 g/dL — AB (ref 3.5–5.0)
ALK PHOS: 210 U/L — AB (ref 40–150)
ALT: 8 U/L (ref 0–55)
AST: 14 U/L (ref 5–34)
Anion Gap: 8 mEq/L (ref 3–11)
BUN: 14.1 mg/dL (ref 7.0–26.0)
CO2: 25 meq/L (ref 22–29)
CREATININE: 0.8 mg/dL (ref 0.6–1.1)
Calcium: 7.5 mg/dL — ABNORMAL LOW (ref 8.4–10.4)
Chloride: 104 mEq/L (ref 98–109)
EGFR: 74 mL/min/{1.73_m2} — AB (ref >90)
GLUCOSE: 93 mg/dL (ref 70–140)
Potassium: 4.2 mEq/L (ref 3.5–5.1)
SODIUM: 136 meq/L (ref 136–145)
TOTAL PROTEIN: 4.9 g/dL — AB (ref 6.4–8.3)

## 2016-07-27 NOTE — Progress Notes (Signed)
[facility] Telephone:(336) [phone]   Fax:(336) [place], MD [address] [place] [address]  DIAGNOSIS: Recurrent non-small cell lung cancer, adenocarcinoma with positive EGFR mutation in exon 21 initially diagnosed in December 2013.  PRIOR THERAPY: 1) Palliative radiotherapy to the enlarging right upper lobe lung mass under the care of Dr. Pablo Ledger completed on 01/06/2015. 2) Tarceva 150 mg by mouth daily status post 22 months of treatment discontinued secondary to disease progression.  CURRENT THERAPY: Tagrisso 80 mg by mouth daily. First dose 07/08/2016.   INTERVAL HISTORY: [name] 76 y.o. female returns to the clinic today for follow-up visit accompanied by her daughter. The patient was started Tagrisso 3 weeks ago and has been tolerating it well except for frequent episodes of diarrhea. She has been using Imodium more frequently recently. She lost around 10 pounds over the last 3 weeks. She is also complaining of increasing fatigue. She has no significant skin rash. She denied having any chest pain but continues to have the baseline shortness of breath increased with exertion. She has no cough or hemoptysis. She denied having any bleeding issues. She has no nausea, vomiting or constipation. She denied having any fever or chills. The patient is here today for evaluation with repeat blood work.   MEDICAL HISTORY: Past Medical History:  Diagnosis Date  . AAA (abdominal aortic aneurysm) ([place])   . Anemia of chronic disease 11/14/2014  . Chronic kidney disease   . COPD (chronic obstructive pulmonary disease) ([place])   . Hypertension   . Lung cancer ([place])   . Pernicious anemia   . Primary cancer of right upper lobe of lung ([place]) 05/10/2012  . Stroke ([place])   . Thyroid disease     ALLERGIES:  is allergic to codeine; penicillins; doxycycline; and pravastatin sodium.  MEDICATIONS:  Current Outpatient Prescriptions   Medication Sig Dispense Refill  . acetaminophen (TYLENOL) 500 MG tablet Take 500-1,000 mg by mouth every 6 (six) hours as needed for moderate pain.     Marland Kitchen albuterol (PROVENTIL) (2.5 MG/3ML) 0.083% nebulizer solution Take 2.5 mg by nebulization every 4 (four) hours. Or Four times daily    . ALPRAZolam (XANAX) 1 MG tablet Take 1 mg by mouth 4 (four) times daily as needed. For anxiety and/or sleep    . amLODipine (NORVASC) 2.5 MG tablet Take 2.5 mg by mouth daily.  11  . clindamycin (CLEOCIN T) 1 % external solution Apply topically 2 (two) times daily. 30 mL 1  . ferrous sulfate 325 (65 FE) MG tablet Take 325 mg by mouth 3 (three) times daily with meals.    . Fluticasone-Salmeterol (ADVAIR) 250-50 MCG/DOSE AEPB Inhale 1 puff into the lungs every 12 (twelve) hours.    Marland Kitchen guaiFENesin (MUCINEX) 600 MG 12 hr tablet Take 600 mg by mouth daily as needed for cough.     . levothyroxine (SYNTHROID, LEVOTHROID) 100 MCG tablet Take 100 mcg by mouth daily before breakfast.     . loperamide (IMODIUM) 2 MG capsule Take 2 mg by mouth as needed for diarrhea or loose stools.    Marland Kitchen loratadine (CLARITIN) 10 MG tablet Take 10 mg by mouth daily as needed for allergies.     . mirtazapine (REMERON) 7.5 MG tablet Take 7.5 mg by mouth at bedtime.  12  . NON FORMULARY OXYGEN      24/7    . osimertinib mesylate (TAGRISSO) 80 MG tablet Take 1  tablet (80 mg total) by mouth daily. 30 tablet 2  . potassium chloride SA (K-DUR,KLOR-CON) 20 MEQ tablet Take 1 tablet (20 mEq total) by mouth daily. 7 tablet 0  . torsemide (DEMADEX) 10 MG tablet Take 10 mg by mouth.    . traMADol (ULTRAM) 50 MG tablet Take 1 tablet (50 mg total) by mouth every 12 (twelve) hours as needed. (Patient not taking: Reported on 07/19/2016) 30 tablet 0   No current facility-administered medications for this visit.     SURGICAL HISTORY:  Past Surgical History:  Procedure Laterality Date  . ABDOMINAL HYSTERECTOMY    . APPENDECTOMY    . CHEST TUBE INSERTION   03/08/2012   Procedure: CHEST TUBE INSERTION;  Surgeon: Ivin Poot, MD;  Location: [facility];  Service: Thoracic;  Laterality: Right;  . CHOLECYSTECTOMY    . COLONOSCOPY N/A 04/29/2014   Procedure: COLONOSCOPY;  Surgeon: Daneil Dolin, MD;  [facility];  Service: Endoscopy;  Laterality: N/A;  1245pm  . ESOPHAGOGASTRODUODENOSCOPY N/A 04/29/2014   Procedure: ESOPHAGOGASTRODUODENOSCOPY (EGD);  Surgeon: Daneil Dolin, MD;  [facility];  Service: Endoscopy;  Laterality: N/A;  Venia Minks DILATION N/A 04/29/2014   Procedure: Venia Minks DILATION;  Surgeon: Daneil Dolin, MD;  [facility];  Service: Endoscopy;  Laterality: N/A;  . RESECTION OF APICAL BLEB  03/17/2012   Procedure: RESECTION OF APICAL BLEB;  Surgeon: Ivin Poot, MD;  [facility];  Service: Thoracic;  Laterality: Right;  stapling of bleb  . SAVORY DILATION N/A 04/29/2014   Procedure: SAVORY DILATION;  Surgeon: Daneil Dolin, MD;  [facility];  Service: Endoscopy;  Laterality: N/A;  . TEE WITHOUT CARDIOVERSION N/A 11/15/2014   Procedure: TRANSESOPHAGEAL ECHOCARDIOGRAM (TEE);  Surgeon: Sueanne Margarita, MD;  Location: [facility] ENDOSCOPY;  Service: Cardiovascular;  Laterality: N/A;  . TUBAL LIGATION    . VIDEO ASSISTED THORACOSCOPY  03/17/2012   Procedure: VIDEO ASSISTED THORACOSCOPY;  Surgeon: Ivin Poot, MD;  Location: [facility] OR;  Service: Thoracic;  Laterality: Right;    REVIEW OF SYSTEMS:  Constitutional: positive for anorexia, fatigue and weight loss Eyes: negative Ears, nose, mouth, throat, and face: negative Respiratory: positive for dyspnea on exertion Cardiovascular: negative Gastrointestinal: negative Genitourinary:negative Integument/breast: negative Hematologic/lymphatic: negative Musculoskeletal:positive for muscle weakness Neurological: negative Behavioral/Psych: negative Endocrine: negative Allergic/Immunologic: negative   PHYSICAL EXAMINATION: General appearance: alert,  cooperative, fatigued and no distress Head: Normocephalic, without obvious abnormality, atraumatic Neck: no adenopathy, no JVD, supple, symmetrical, trachea midline and thyroid not enlarged, symmetric, no tenderness/mass/nodules Lymph nodes: Cervical, supraclavicular, and axillary nodes normal. Resp: clear to auscultation bilaterally Back: symmetric, no curvature. ROM normal. No CVA tenderness. Cardio: regular rate and rhythm, S1, S2 normal, no murmur, click, rub or gallop GI: soft, non-tender; bowel sounds normal; no masses,  no organomegaly Extremities: extremities normal, atraumatic, no cyanosis or edema Neurologic: Alert and oriented X 3, normal strength and tone. Normal symmetric reflexes. Normal coordination and gait  ECOG PERFORMANCE STATUS: 2 - Symptomatic, <50% confined to bed  Blood pressure 112/62, pulse 84, temperature 97.7 F (36.5 C), temperature source Oral, resp. rate 18, height 5' 4"  (1.626 m), weight 85 lb 14.4 oz (39 kg), SpO2 100 %. Repeat blood pressure was 152/54  LABORATORY DATA: Lab Results  Component Value Date   WBC 3.2 (L) 07/27/2016   HGB 8.2 (L) 07/27/2016   HCT 25.4 (L) 07/27/2016   MCV 90.0 07/27/2016   PLT 238 07/27/2016  Chemistry      Component Value Date/Time   NA 139 07/06/2016 0942   K 3.5 07/06/2016 0942   CL 103 11/13/2014 1744   CO2 23 07/06/2016 0942   BUN 13.8 07/06/2016 0942   CREATININE 0.7 07/06/2016 0942      Component Value Date/Time   CALCIUM 7.6 (L) 07/06/2016 0942   ALKPHOS 323 (H) 07/06/2016 0942   AST 18 07/06/2016 0942   ALT 15 07/06/2016 0942   BILITOT 0.31 07/06/2016 0942       RADIOGRAPHIC STUDIES: Dg Chest 2 View  Result Date: 07/09/2016 CLINICAL DATA:  Fall. EXAM: CHEST  2 VIEW COMPARISON:  CT 05/17/2016, 02/11/2016. FINDINGS: Mediastinum stable. Postsurgical changes right lung. Severe bilateral pleural-parenchymal thickening, particularly prominent right upper and lower lung again noted consistent with  scarring. Reference is made to prior CT report 05/17/2016 for discussion of pulmonary densities present. No acute abnormality identified. Stable cardiomegaly. Thoracic aorta is tortuous. Diffuse osteopenia degenerative change. No acute fracture. Stable pancreatic calcifications. Surgical clips upper abdomen. IMPRESSION: 1. Severe pleural-parenchymal scarring. Postsurgical changes right lung. Reference made to prior CT report of 03/16/2017 for discussion of pulmonary densities present. Stable cardiomegaly. No acute cardiopulmonary disease. 2. No acute bony abnormality. Electronically Signed   By: Marcello Moores  Register   On: 07/09/2016 14:07    ASSESSMENT AND PLAN:  This is a very pleasant 76 years old white female with recurrent non-small cell lung cancer, adenocarcinoma with positive EGFR mutation in exon 21 status post treatment with Tarceva 422 months discontinued in March 2018 secondary to disease progression. The patient was started on treatment with Tagrisso 80 mg by mouth daily on 07/08/2016. She is tolerating this treatment well except for frequent episodes of diarrhea. I recommended for the patient to continue her treatment with Tagrisso with the same dose and she will use Imodium more frequently. If she has no improvement of the diarrhea on Imodium, I would consider her for treatment with Lomotil in addition to Imodium. For the weight loss, we gave the patient supplements of Ensure for the clinic today. I will also increase her dose of Remeron to 15 mg by mouth daily at bedtime. For pain management, the patient will continue on her treatment with tramadol. For the anemia of chronic disease, we will continue to monitor her hemoglobin and hematocrit closely and consider the patient for transfusion if her hemoglobin is less than 8.0 g/dL. For hypertension, she will continue with the current blood pressure medication. I will see the patient back for follow-up visit in 2-3 weeks for reevaluation and  management of any adverse effect of her treatment. She was advised to call immediately if she has any concerning symptoms in the interval. The patient voices understanding of current disease status and treatment options and is in agreement with the current care plan. All questions were answered. The patient knows to call the clinic with any problems, questions or concerns. We can certainly see the patient much sooner if necessary.  Disclaimer: This note was dictated with voice recognition software. Similar sounding words can inadvertently be transcribed and may not be corrected upon review.

## 2016-07-27 NOTE — Telephone Encounter (Signed)
Appointments scheduled per 07/27/16 los. Patient was given a copy of the AVS report and appointment schedule per 07/27/16 los. °

## 2016-08-08 DIAGNOSIS — J9610 Chronic respiratory failure, unspecified whether with hypoxia or hypercapnia: Secondary | ICD-10-CM | POA: Diagnosis not present

## 2016-08-08 DIAGNOSIS — J449 Chronic obstructive pulmonary disease, unspecified: Secondary | ICD-10-CM | POA: Diagnosis not present

## 2016-08-10 NOTE — Progress Notes (Signed)
Nutrition Assessment   Reason for Assessment:   Patient identified on Malnutrition Screening report for poor appetite and weight loss  ASSESSMENT:  76 year old female with lung cancer and patient of Dr. Julien Nordmann. Past medical history of CKD, COPD, HTN, stroke.  Patient taking tagrisso.  Patient reports ate 100% of mashed potatoes and biscuit for lunch, few bites of mixed veggies and peas, and drank 1/2 ensure.  Reports poor appetite due to diarrhea and no desire to eat.  Reports imodium helps with diarrhea and remeron has help increase appetite.    Medications: reviewed  Labs: reviewed  Anthropometrics:   Height: 64 inches Weight: 85 lb UBW: 130 lb about 4-5 years ago BMI: 14 10% weight loss in 3 weeks, significant   NUTRITION DIAGNOSIS: Inadequate oral intake related to cancer and cancer related treatments as evidenced by poor appetite, diarrhea, 10% weight loss in 3 weeks    INTERVENTION:   Discussed strategies to increase calories and protein.  Will mail fact sheet. Also discussed ways nutrition strategies and diarrhea. Fact sheet mailed. Encouraged intake of ensure plus/boost plus.  Coupons mailed. Contact information mailed    MONITORING, EVALUATION, GOAL: patient will increase calories and protein to decrease weight loss   NEXT VISIT: as needed  Hiilei Gerst B. Zenia Resides, [place], [place] Registered Dietitian [phone] (pager)

## 2016-08-17 ENCOUNTER — Telehealth: Payer: Self-pay | Admitting: Internal Medicine

## 2016-08-17 ENCOUNTER — Ambulatory Visit (HOSPITAL_BASED_OUTPATIENT_CLINIC_OR_DEPARTMENT_OTHER): Payer: PPO | Admitting: Internal Medicine

## 2016-08-17 ENCOUNTER — Other Ambulatory Visit: Payer: Self-pay | Admitting: Medical Oncology

## 2016-08-17 ENCOUNTER — Ambulatory Visit (HOSPITAL_COMMUNITY)
Admission: RE | Admit: 2016-08-17 | Discharge: 2016-08-17 | Disposition: A | Discharge status: Home / Self Care | Payer: PPO | Source: Ambulatory Visit | Attending: Internal Medicine | Admitting: Internal Medicine

## 2016-08-17 ENCOUNTER — Encounter: Payer: Self-pay | Admitting: Internal Medicine

## 2016-08-17 ENCOUNTER — Other Ambulatory Visit (HOSPITAL_BASED_OUTPATIENT_CLINIC_OR_DEPARTMENT_OTHER): Payer: PPO

## 2016-08-17 VITALS — BP 117/57 | HR 85 | Temp 97.9°F | Resp 18 | Ht 64.0 in | Wt 83.8 lb

## 2016-08-17 DIAGNOSIS — D649 Anemia, unspecified: Secondary | ICD-10-CM

## 2016-08-17 DIAGNOSIS — Z5111 Encounter for antineoplastic chemotherapy: Secondary | ICD-10-CM

## 2016-08-17 DIAGNOSIS — G893 Neoplasm related pain (acute) (chronic): Secondary | ICD-10-CM | POA: Diagnosis not present

## 2016-08-17 DIAGNOSIS — E46 Unspecified protein-calorie malnutrition: Secondary | ICD-10-CM

## 2016-08-17 DIAGNOSIS — I10 Essential (primary) hypertension: Secondary | ICD-10-CM

## 2016-08-17 DIAGNOSIS — C3411 Malignant neoplasm of upper lobe, right bronchus or lung: Secondary | ICD-10-CM | POA: Diagnosis not present

## 2016-08-17 DIAGNOSIS — D638 Anemia in other chronic diseases classified elsewhere: Secondary | ICD-10-CM

## 2016-08-17 DIAGNOSIS — M7989 Other specified soft tissue disorders: Secondary | ICD-10-CM

## 2016-08-17 LAB — COMPREHENSIVE METABOLIC PANEL
ALBUMIN: 1.8 g/dL — AB (ref 3.5–5.0)
ALK PHOS: 163 U/L — AB (ref 40–150)
ALT: 10 U/L (ref 0–55)
AST: 14 U/L (ref 5–34)
Anion Gap: 8 mEq/L (ref 3–11)
BUN: 18.4 mg/dL (ref 7.0–26.0)
CO2: 22 mEq/L (ref 22–29)
Calcium: 7.5 mg/dL — ABNORMAL LOW (ref 8.4–10.4)
Chloride: 103 mEq/L (ref 98–109)
Creatinine: 0.8 mg/dL (ref 0.6–1.1)
EGFR: 73 mL/min/{1.73_m2} — AB (ref >90)
GLUCOSE: 88 mg/dL (ref 70–140)
Potassium: 4.5 mEq/L (ref 3.5–5.1)
SODIUM: 133 meq/L — AB (ref 136–145)
TOTAL PROTEIN: 4.7 g/dL — AB (ref 6.4–8.3)

## 2016-08-17 LAB — CBC WITH DIFFERENTIAL/PLATELET
BASO%: 0.6 % (ref 0.0–2.0)
Basophils Absolute: 0 10*3/uL (ref 0.0–0.1)
EOS ABS: 0 10*3/uL (ref 0.0–0.5)
EOS%: 1.1 % (ref 0.0–7.0)
HEMATOCRIT: 22.1 % — AB (ref 34.8–46.6)
HEMOGLOBIN: 7 g/dL — AB (ref 11.6–15.9)
LYMPH#: 0.6 10*3/uL — AB (ref 0.9–3.3)
LYMPH%: 18.5 % (ref 14.0–49.7)
MCH: 29.4 pg (ref 25.1–34.0)
MCHC: 31.7 g/dL (ref 31.5–36.0)
MCV: 92.7 fL (ref 79.5–101.0)
MONO#: 0.4 10*3/uL (ref 0.1–0.9)
MONO%: 11.8 % (ref 0.0–14.0)
NEUT%: 68 % (ref 38.4–76.8)
NEUTROS ABS: 2.1 10*3/uL (ref 1.5–6.5)
Platelets: 177 10*3/uL (ref 145–400)
RBC: 2.38 10*6/uL — ABNORMAL LOW (ref 3.70–5.45)
RDW: 16.9 % — AB (ref 11.2–14.5)
WBC: 3.1 10*3/uL — AB (ref 3.9–10.3)

## 2016-08-17 MED ORDER — PROCHLORPERAZINE MALEATE 10 MG PO TABS: 10.0000 mg | ORAL_TABLET | Freq: Four times a day (QID) | ORAL | 0 refills | Status: AC | PRN

## 2016-08-17 NOTE — Telephone Encounter (Signed)
Patient was given 2 bottles of Contrast with a copy of the instructions, per CT ordered.  Appointments scheduled per 08/17/16 los. Patient was given a copy of the AVS report and appointment schedule per 08/17/16 los.

## 2016-08-17 NOTE — Progress Notes (Signed)
[place] Telephone:([phone]   Fax:(336) [phone]  OFFICE PROGRESS NOTE  [place], MD [address] [place] [address]  DIAGNOSIS: Recurrent non-small cell lung cancer, adenocarcinoma with positive EGFR mutation in exon 21 initially diagnosed in December 2013.  PRIOR THERAPY: 1) Palliative radiotherapy to the enlarging right upper lobe lung mass under the care of Dr. Pablo Ledger completed on 01/06/2015. 2) Tarceva 150 mg by mouth daily status post 22 months of treatment discontinued secondary to disease progression.  CURRENT THERAPY: Tagrisso 80 mg by mouth daily. First dose 07/08/2016.   INTERVAL HISTORY: [name] 76 y.o. female returns to the clinic today for follow-up visit accompanied by her daughter. The patient has been complaining of increasing fatigue and weakness as well as intermittent nausea. She she recently lost her son-in-law and the patient was a little bit depressed. She was not eating and drinking a lot of recently. She continues to have shortness breath at baseline and increased with exertion but no significant chest pain, cough or hemoptysis. She has no diarrhea. She denied having any significant skin rash. She has been tolerating her treatment with Tagrisso fairly well. She is here today for evaluation and repeat blood work.  MEDICAL HISTORY: Past Medical History:  Diagnosis Date  . AAA (abdominal aortic aneurysm) ([place])   . Anemia of chronic disease 11/14/2014  . Chronic kidney disease   . COPD (chronic obstructive pulmonary disease) ([place])   . Hypertension   . Lung cancer ([place])   . Pernicious anemia   . Primary cancer of right upper lobe of lung ([place]) 05/10/2012  . Stroke ([place])   . Thyroid disease     ALLERGIES:  is allergic to codeine; penicillins; doxycycline; and pravastatin sodium.  MEDICATIONS:  Current Outpatient Prescriptions  Medication Sig Dispense Refill  . acetaminophen (TYLENOL) 500 MG tablet Take  500-1,000 mg by mouth every 6 (six) hours as needed for moderate pain.     Marland Kitchen albuterol (PROVENTIL) (2.5 MG/3ML) 0.083% nebulizer solution Take 2.5 mg by nebulization every 4 (four) hours. Or Four times daily    . ALPRAZolam (XANAX) 1 MG tablet Take 1 mg by mouth 4 (four) times daily as needed. For anxiety and/or sleep    . amLODipine (NORVASC) 2.5 MG tablet Take 2.5 mg by mouth daily.  11  . clindamycin (CLEOCIN T) 1 % external solution Apply topically 2 (two) times daily. 30 mL 1  . ferrous sulfate 325 (65 FE) MG tablet Take 325 mg by mouth 3 (three) times daily with meals.    . Fluticasone-Salmeterol (ADVAIR) 250-50 MCG/DOSE AEPB Inhale 1 puff into the lungs every 12 (twelve) hours.    Marland Kitchen guaiFENesin (MUCINEX) 600 MG 12 hr tablet Take 600 mg by mouth daily as needed for cough.     . levothyroxine (SYNTHROID, LEVOTHROID) 100 MCG tablet Take 100 mcg by mouth daily before breakfast.     . loperamide (IMODIUM) 2 MG capsule Take 2 mg by mouth as needed for diarrhea or loose stools.    Marland Kitchen loratadine (CLARITIN) 10 MG tablet Take 10 mg by mouth daily as needed for allergies.     . mirtazapine (REMERON) 7.5 MG tablet Take 7.5 mg by mouth at bedtime.  12  . NON FORMULARY OXYGEN      24/7    . osimertinib mesylate (TAGRISSO) 80 MG tablet Take 1 tablet (80 mg total) by mouth daily. 30 tablet 2  . potassium chloride SA (K-DUR,KLOR-CON) 20 MEQ  tablet Take 1 tablet (20 mEq total) by mouth daily. 7 tablet 0  . torsemide (DEMADEX) 10 MG tablet Take 10 mg by mouth.    . traMADol (ULTRAM) 50 MG tablet Take 1 tablet (50 mg total) by mouth every 12 (twelve) hours as needed. (Patient not taking: Reported on 07/19/2016) 30 tablet 0   No current facility-administered medications for this visit.     SURGICAL HISTORY:  Past Surgical History:  Procedure Laterality Date  . ABDOMINAL HYSTERECTOMY    . APPENDECTOMY    . CHEST TUBE INSERTION  03/08/2012   Procedure: CHEST TUBE INSERTION;  Surgeon: Ivin Poot, MD;   Location: [facility];  Service: Thoracic;  Laterality: Right;  . CHOLECYSTECTOMY    . COLONOSCOPY N/A 04/29/2014   Procedure: COLONOSCOPY;  Surgeon: Daneil Dolin, MD;  [facility];  Service: Endoscopy;  Laterality: N/A;  1245pm  . ESOPHAGOGASTRODUODENOSCOPY N/A 04/29/2014   Procedure: ESOPHAGOGASTRODUODENOSCOPY (EGD);  Surgeon: Daneil Dolin, MD;  [facility];  Service: Endoscopy;  Laterality: N/A;  Venia Minks DILATION N/A 04/29/2014   Procedure: Venia Minks DILATION;  Surgeon: Daneil Dolin, MD;  [facility];  Service: Endoscopy;  Laterality: N/A;  . RESECTION OF APICAL BLEB  03/17/2012   Procedure: RESECTION OF APICAL BLEB;  Surgeon: Ivin Poot, MD;  Location: [facility] OR;  Service: Thoracic;  Laterality: Right;  stapling of bleb  . SAVORY DILATION N/A 04/29/2014   Procedure: SAVORY DILATION;  Surgeon: Daneil Dolin, MD;  [facility];  Service: Endoscopy;  Laterality: N/A;  . TEE WITHOUT CARDIOVERSION N/A 11/15/2014   Procedure: TRANSESOPHAGEAL ECHOCARDIOGRAM (TEE);  Surgeon: Sueanne Margarita, MD;  Location: [facility] ENDOSCOPY;  Service: Cardiovascular;  Laterality: N/A;  . TUBAL LIGATION    . VIDEO ASSISTED THORACOSCOPY  03/17/2012   Procedure: VIDEO ASSISTED THORACOSCOPY;  Surgeon: Ivin Poot, MD;  Location: [facility] OR;  Service: Thoracic;  Laterality: Right;    REVIEW OF SYSTEMS:  Constitutional: positive for anorexia, fatigue and weight loss Eyes: negative Ears, nose, mouth, throat, and face: negative Respiratory: positive for dyspnea on exertion Cardiovascular: negative Gastrointestinal: negative Genitourinary:negative Integument/breast: negative Hematologic/lymphatic: negative Musculoskeletal:positive for muscle weakness Neurological: negative Behavioral/Psych: negative Endocrine: negative Allergic/Immunologic: negative   PHYSICAL EXAMINATION: General appearance: alert, cooperative, fatigued and no distress Head: Normocephalic, without obvious  abnormality, atraumatic Neck: no adenopathy, no JVD, supple, symmetrical, trachea midline and thyroid not enlarged, symmetric, no tenderness/mass/nodules Lymph nodes: Cervical, supraclavicular, and axillary nodes normal. Resp: clear to auscultation bilaterally Back: symmetric, no curvature. ROM normal. No CVA tenderness. Cardio: regular rate and rhythm, S1, S2 normal, no murmur, click, rub or gallop GI: soft, non-tender; bowel sounds normal; no masses,  no organomegaly Extremities: extremities normal, atraumatic, no cyanosis or edema Neurologic: Alert and oriented X 3, normal strength and tone. Normal symmetric reflexes. Normal coordination and gait  ECOG PERFORMANCE STATUS: 2 - Symptomatic, <50% confined to bed  Blood pressure (!) 117/57, pulse 85, temperature 97.9 F (36.6 C), temperature source Oral, resp. rate 18, height 5' 4"  (1.626 m), weight 83 lb 12.8 oz (38 kg), SpO2 100 %. Repeat blood pressure was 152/54  LABORATORY DATA: Lab Results  Component Value Date   WBC 3.1 (L) 08/17/2016   HGB 7.0 (L) 08/17/2016   HCT 22.1 (L) 08/17/2016   MCV 92.7 08/17/2016   PLT 177 08/17/2016      Chemistry      Component Value Date/Time   NA 136 07/27/2016 0827  K 4.2 07/27/2016 0827   CL 103 11/13/2014 1744   CO2 25 07/27/2016 0827   BUN 14.1 07/27/2016 0827   CREATININE 0.8 07/27/2016 0827      Component Value Date/Time   CALCIUM 7.5 (L) 07/27/2016 0827   ALKPHOS 210 (H) 07/27/2016 0827   AST 14 07/27/2016 0827   ALT 8 07/27/2016 0827   BILITOT <0.22 07/27/2016 0827       RADIOGRAPHIC STUDIES: No results found.  ASSESSMENT AND PLAN:  This is a very pleasant 76 years old white female with recurrent non-small cell lung cancer, adenocarcinoma with positive EGFR mutation in exon 21 status post treatment with Tarceva for over 22 months discontinued in March 2018 secondary to disease progression. The patient was started on Tagrisso 80 mg by mouth daily 6 weeks ago. She has been  tolerating the treatment well with no significant adverse effects. I recommended for her to continue her current treatment was Tagrisso the same dose. I will see her back for follow-up visit in one month's for evaluation after repeating CT scan of the chest, abdomen and pelvis for restaging of her disease. For the anemia of chronic disease, her hemoglobin is 7.0 today. I will arrange for the patient to receive 2 units of PRBCs transfusion. The patient may need reevaluation by her gastroenterologist in [place]. For the chronic pain management, the patient will continue her current treatment with tramadol. For the malnutrition, she will continue her current treatment with Remeron 15 mg by mouth daily at bedtime. She was advised to call immediately if she has any concerning symptoms in the interval. The patient voices understanding of current disease status and treatment options and is in agreement with the current care plan. All questions were answered. The patient knows to call the clinic with any problems, questions or concerns. We can certainly see the patient much sooner if necessary.  Disclaimer: This note was dictated with voice recognition software. Similar sounding words can inadvertently be transcribed and may not be corrected upon review.

## 2016-08-17 NOTE — Telephone Encounter (Signed)
Spoke with patient re lab/PRBC's 5/18 @ 11 am to arrive 10:30 am. Per desk nurse Friday ok.

## 2016-08-19 ENCOUNTER — Ambulatory Visit (HOSPITAL_COMMUNITY)
Admission: RE | Admit: 2016-08-19 | Discharge: 2016-08-19 | Disposition: A | Discharge status: Home / Self Care | Payer: PPO | Source: Ambulatory Visit | Attending: Internal Medicine | Admitting: Internal Medicine

## 2016-08-19 DIAGNOSIS — D649 Anemia, unspecified: Secondary | ICD-10-CM | POA: Insufficient documentation

## 2016-08-20 ENCOUNTER — Other Ambulatory Visit: Payer: PPO

## 2016-08-20 ENCOUNTER — Ambulatory Visit (HOSPITAL_BASED_OUTPATIENT_CLINIC_OR_DEPARTMENT_OTHER): Payer: PPO

## 2016-08-20 DIAGNOSIS — D649 Anemia, unspecified: Secondary | ICD-10-CM | POA: Diagnosis not present

## 2016-08-20 LAB — PREPARE RBC (CROSSMATCH)

## 2016-08-20 MED ORDER — DIPHENHYDRAMINE HCL 25 MG PO CAPS
ORAL_CAPSULE | ORAL | Status: AC
  Filled 2016-08-20: qty 1

## 2016-08-20 MED ORDER — ACETAMINOPHEN 325 MG PO TABS: 650.0000 mg | ORAL_TABLET | Freq: Once | ORAL | Status: DC

## 2016-08-20 MED ORDER — DIPHENHYDRAMINE HCL 25 MG PO CAPS
25.0000 mg | ORAL_CAPSULE | Freq: Once | ORAL | Status: DC
Start: 2016-08-20 — End: 2016-08-20

## 2016-08-20 MED ORDER — SODIUM CHLORIDE 0.9 % IV SOLN
250.0000 mL | Freq: Once | INTRAVENOUS | Status: AC
  Administered 2016-08-20: 250 mL via INTRAVENOUS

## 2016-08-20 MED ORDER — ACETAMINOPHEN 325 MG PO TABS
ORAL_TABLET | ORAL | Status: AC
  Filled 2016-08-20: qty 2

## 2016-08-20 NOTE — Patient Instructions (Signed)

## 2016-08-21 LAB — TYPE AND SCREEN
ABO/RH(D): A NEG
Antibody Screen: NEGATIVE
UNIT DIVISION: 0
Unit division: 0

## 2016-08-21 LAB — BPAM RBC
BLOOD PRODUCT EXPIRATION DATE: 201805212359
BLOOD PRODUCT EXPIRATION DATE: 201806062359
ISSUE DATE / TIME: 201805181140
ISSUE DATE / TIME: 201805181140
UNIT TYPE AND RH: 600
Unit Type and Rh: 600

## 2016-08-22 DIAGNOSIS — J449 Chronic obstructive pulmonary disease, unspecified: Secondary | ICD-10-CM | POA: Diagnosis not present

## 2016-08-22 DIAGNOSIS — J439 Emphysema, unspecified: Secondary | ICD-10-CM | POA: Diagnosis not present

## 2016-09-06 ENCOUNTER — Other Ambulatory Visit: Payer: Self-pay | Admitting: Medical Oncology

## 2016-09-06 DIAGNOSIS — C3411 Malignant neoplasm of upper lobe, right bronchus or lung: Secondary | ICD-10-CM

## 2016-09-06 MED ORDER — OSIMERTINIB MESYLATE 80 MG PO TABS: 80.0000 mg | ORAL_TABLET | Freq: Every day | ORAL | 2 refills | Status: AC

## 2016-09-08 DIAGNOSIS — J9610 Chronic respiratory failure, unspecified whether with hypoxia or hypercapnia: Secondary | ICD-10-CM | POA: Diagnosis not present

## 2016-09-08 DIAGNOSIS — J449 Chronic obstructive pulmonary disease, unspecified: Secondary | ICD-10-CM | POA: Diagnosis not present

## 2016-09-09 ENCOUNTER — Other Ambulatory Visit: Payer: Self-pay | Admitting: *Deleted

## 2016-09-09 NOTE — Patient Outreach (Signed)
[facility]) Care Management  09/09/2016  [name] 07/31/1940 [id number]  HTA self referral & MD office: Primary care -Dr. Purcell Nails [facility] Oncologist-Dr. Curt Bears Caregiver-[name]-[phone]. Pt lives with daughter.[address], [place], [place] [address].  Telephone call to patient who was advised of [facility] care management services. HIPPA verification received. Spoke with patient & caregiver/Rose via speaker phone.  Patient agreed to screening assessment which was completed.   Major concerns voiced by patient are lost of appetite, weakness, sleeps all the time, loss of weight, & not getting enough exercise. States she has diarrhea after chemo pill but prescription for Imodium has decreased the problem.   Health issues are Lung cancer, COPD, emphysema (on O2 24/7), thyroid condition, hx stroke, anemia, HTN (controlled), malnutrition.  States she eats small amounts frequently & drinks Ensure 2 times weekly. Encouraged patient to eat  foods that she likes,  to increase weight (currently has no dietary restrictions). Encouraged to drink plenty of fluids-states she drinks lots of water.   States gets medications from [facility]. States most recent prescription has been approved by her insurance company. Voices that she manages her own medications. States daughter provides transportation & fixes her meals. Sees oncologist once a month.   Assessment:  -Pt lives with daughter who is her caregiver. -Has severe weight loss -BMI 14 -Malnutrition-severe loss of appetite -Chemo causes diarrhea (taking imodium) -Falls risk-has weakness & drowsiness. -Not getting enough exercise-gait imbalance -Does not have Advance Directives-Dx:recurrent small cell lung cancer.  Patient consents to [facility] care services.     Plan: Refer to care management assistant to [facility] care coordinator for complex case management & chronic disease  management.  Sherrin Daisy, RN BSN [place] Management Coordinator [facility] Care Management  [phone]

## 2016-09-10 ENCOUNTER — Other Ambulatory Visit: Payer: Self-pay | Admitting: *Deleted

## 2016-09-10 ENCOUNTER — Telehealth: Payer: Self-pay | Admitting: Medical Oncology

## 2016-09-10 NOTE — Telephone Encounter (Signed)
Daughter reports pt is weak and wants to know if Julien Nordmann will check her b12 and d3 blood levels. She took b12 infections 2  years ago.

## 2016-09-10 NOTE — Patient Outreach (Signed)
[facility]) Care Management  09/10/2016  [name] 11/28/1940 [id number]  Referral received from [facility] after HTA self referral along with recommendation from MD office (Dr. Redmond School at [facility]).   [name] is a 76 year old female with medical history which includes lung cancer, COPD, emphysema (O2 dependent), thyroid condition, history of CVA, anemia, HTN (controlled), and malnutrition.  [name]'s care team:  Primary care -Dr. Purcell Nails [facility] Oncologist-Dr. Curt Bears Caregiver-Daughter-[name]-[phone] ([address], [place], [place])  I reached out to [name] at home today but was unable to reach her or her daughter. I left a HIPPA compliant voice message requesting a return call.   Plan: I will reach out to [name] again on Monday.    [facility]  [phone]

## 2016-09-11 NOTE — Telephone Encounter (Signed)
OK 

## 2016-09-13 ENCOUNTER — Other Ambulatory Visit: Payer: PPO

## 2016-09-13 ENCOUNTER — Other Ambulatory Visit: Payer: Self-pay | Admitting: *Deleted

## 2016-09-13 ENCOUNTER — Ambulatory Visit (HOSPITAL_COMMUNITY): Admission: RE | Admit: 2016-09-13 | Payer: PPO | Source: Ambulatory Visit

## 2016-09-13 NOTE — Patient Outreach (Addendum)
[facility]) Care Reese  09/13/2016  [name] [birth year] [id number]  Return call received from [name]'s Reese [name] with whom [name] lives. [name] is [name]'s primary caregiver.   She confirms that [name] was last seen by Rose primary care provider in April 2018. [name] was scheduled to see Rose oncologist, [name] today but cancelled because she felt so weak and didn't think she would tolerate the trip to the office.   [name] says that she has reached out to [name] office to inquire about home care services. Currently, [name] is not receiving home care services. I will discuss this further with the patient care coordinator at [facility] after I meet with [name] and [name] at home tomorrow.   I reviewed medications with Mrs. Wagoner and there are no discrepancies.   Plan: I will see [name] at Rose home tomorrow for an initial home visit.   [name]  [phone]

## 2016-09-14 ENCOUNTER — Other Ambulatory Visit: Payer: Self-pay | Admitting: *Deleted

## 2016-09-14 NOTE — Patient Outreach (Signed)
[facility]) Care Management   09/14/2016  [name] 11-Jan-1941 [id number]  [name] is an 76 y.o. female with medical history which includes lung cancer, COPD, emphysema (O2 dependent), thyroid condition, history of CVA, anemia, HTN (controlled), and malnutrition.  I am seeing [name] at home today. She was referred to [facility] by her primary care provider, Dr. Redmond School.   Care Team:  Primarycare -Dr. Purcell Nails [facility]-Dr. Curt Bears Caregiver-Reese-[name]-[phone] ([address], [place], [place])  Subjective: "I just want to be strong enough to walk down the hall on my own. I don't have any appetite and I still have terrible diarrhea."  Objective:  BP 90/62   Pulse 83   SpO2 97%   Review of Systems  Constitutional: Positive for malaise/fatigue and weight loss.  HENT: Negative.   Eyes: Negative.   Respiratory: Positive for sputum production and shortness of breath. Negative for cough and wheezing.        Minimal sputum production after breathing treatment  Cardiovascular: Negative for chest pain, palpitations and leg swelling.  Gastrointestinal: Positive for diarrhea.  Genitourinary: Negative.   Musculoskeletal: Positive for falls and myalgias.  Skin: Negative.   Neurological: Positive for weakness.    Physical Exam  Constitutional: She is oriented to person, place, and time. She appears cachectic. She does not have a sickly appearance. She does not appear ill.  Cardiovascular: Normal rate and regular rhythm.   Respiratory: Effort normal. She has decreased breath sounds in the left upper field, the left middle field and the left lower field. She has no wheezes. She has no rhonchi. She has no rales.  Breath sounds diminished/dull on left  GI: Soft. There is no tenderness.  Neurological: She is alert and oriented to person, place, and time.  Skin: Skin is warm, dry and intact.  Psychiatric: She  has a normal mood and affect. Her speech is normal and behavior is normal. Judgment and thought content normal. Cognition and memory are normal.    Encounter Medications:   Outpatient Encounter Prescriptions as of 09/14/2016  Medication Sig Note  . acetaminophen (TYLENOL) 500 MG tablet Take 500-1,000 mg by mouth every 6 (six) hours as needed for moderate pain.  03/04/2015: NO more than tid takes 2 at at time  . albuterol (PROVENTIL) (2.5 MG/3ML) 0.083% nebulizer solution Take 2.5 mg by nebulization every 4 (four) hours. Or Four times daily 11/13/2014: Pt has taken treatment twice today.  . ferrous sulfate 325 (65 FE) MG tablet Take 325 mg by mouth 3 (three) times daily with meals. 10/22/2014: Takes one a day  . Fluticasone-Salmeterol (ADVAIR) 250-50 MCG/DOSE AEPB Inhale 1 puff into the lungs every 12 (twelve) hours.   Marland Kitchen guaiFENesin (MUCINEX) 600 MG 12 hr tablet Take 600 mg by mouth daily as needed for cough.    . levothyroxine (SYNTHROID, LEVOTHROID) 100 MCG tablet Take 100 mcg by mouth daily before breakfast.    . loperamide (IMODIUM) 2 MG capsule Take 2 mg by mouth as needed for diarrhea or loose stools.   . ALPRAZolam (XANAX) 1 MG tablet Take 1 mg by mouth 4 (four) times daily as needed. For anxiety and/or sleep 09/14/2016: Takes as needed, usually at bedtime  . amLODipine (NORVASC) 2.5 MG tablet Take 2.5 mg by mouth daily. 09/14/2016: Has not been taking x approximately 1 month secondary to hypotension   . clindamycin (CLEOCIN T) 1 % external solution Apply topically 2 (two) times daily. (Patient not  taking: Reported on 09/14/2016) 09/14/2016: On hand; takes prn; has not needed lately  . loratadine (CLARITIN) 10 MG tablet Take 10 mg by mouth daily as needed for allergies.    . mirtazapine (REMERON) 7.5 MG tablet Take 7.5 mg by mouth at bedtime. 03/04/2015: Received from: External Pharmacy  . NON FORMULARY OXYGEN      24/7   . osimertinib mesylate (TAGRISSO) 80 MG tablet Take 1 tablet (80 mg total) by  mouth daily.   . potassium chloride SA (K-DUR,KLOR-CON) 20 MEQ tablet Take 1 tablet (20 mEq total) by mouth daily.   . prochlorperazine (COMPAZINE) 10 MG tablet Take 1 tablet (10 mg total) by mouth every 6 (six) hours as needed for nausea or vomiting.   . torsemide (DEMADEX) 10 MG tablet Take 10 mg by mouth. 02/18/2016: Received from: External Pharmacy  . traMADol (ULTRAM) 50 MG tablet Take 1 tablet (50 mg total) by mouth every 12 (twelve) hours as needed. (Patient not taking: Reported on 07/19/2016)    No facility-administered encounter medications on file as of 09/14/2016.     Functional Status:   In your present state of health, do you have any difficulty performing the following activities: 09/09/2016 09/09/2016  Hearing? - N  Vision? (No Data) Y  Difficulty concentrating or making decisions? - N  Walking or climbing stairs? - Y  Dressing or bathing? - N  Doing errands, shopping? - Y  Some recent data might be hidden    Fall/Depression Screening:    Fall Risk  09/13/2016 09/09/2016 12/05/2014  Falls in the past year? Yes Yes No  Number falls in past yr: 1 1 -  Injury with Fall? No No -  Risk for fall due to : History of fall(s);Impaired balance/gait;Impaired mobility;Medication side effect;Other (Comment) History of fall(s);Impaired balance/gait;Medication side effect -  Risk for fall due to (comments): Other: Malnutrition - -  Follow up Education provided;Falls prevention discussed;Follow up appointment Falls prevention discussed -   PHQ 2/9 Scores 09/09/2016 12/05/2014  PHQ - 2 Score 1 1    Assessment:  76 year old female with COPD and Lung Cancer living in [place] with her Reese and son in law. Patient's verbalized primary concern is weight loss, poor appetite, diarrhea, and weakness.   Knowledge Deficits related to management of poor appetite/weight loss - [name] weighed 100 pounds a year ago and in January; she has lost approximately 20 pounds since then and says she has lost  seemingly more rapidly in the last month; she has poor appetite and early satiety; she denies nausea; she has persistent diarrhea; she started taking Megace approximately 3 days ago  [name] has been trying to eat routine meal times thrice daily and intermittently uses Boost supplements but says she does not take them every day. She is willing to try eating small amounts every few hours while awake rather than becoming overwhelmed with trying to eat "a normal meal".  I reached out to registered dietician Jearld Fenton who is in [place] for guidance. In addition, I will reach out to the lung cancer navigator for recommendations and support for [name]. I have provided Emmi educational materials for review (malnutrition).   [name], [name] is inquiring about the possibility of B12 deficiency and wants to know if Dr. Luan Pulling or any other provider on the care team might want to check a B12 level.   Chronic Persistent Diarrhea - patient states today that she has persistent diarrhea; she reports drinking "a lot"  of water to try to avoid dehydration; she takes Immodium prn; her Reese inquires about "something prescription" to take for diarrhea.   Plan:  [name] will try to eat small amounts every few hours in an effort to increase overall caloric intake.   I will notify Dr. Julien Nordmann of [name]'s complaint of persistent diarrhea.   I will inquire with Dr. Luan Pulling upon his return next week if he wishes to have B12 level checked, as per [name] and her Reese's request.   I will follow up with [name] by phone next week and will plan visits thereafter as appropriate.    [facility]  [phone]

## 2016-09-15 ENCOUNTER — Ambulatory Visit: Payer: PPO | Admitting: Internal Medicine

## 2016-09-16 ENCOUNTER — Other Ambulatory Visit: Payer: Self-pay | Admitting: *Deleted

## 2016-09-16 LAB — GUARDANT 360

## 2016-09-16 NOTE — Patient Instructions (Signed)
Megestrol tablets What is this medicine? MEGESTROL (me JES trol) belongs to a class of drugs known as progestins. Megestrol tablets are used to treat advanced breast or endometrial cancer. This medicine may be used for other purposes; ask your health care provider or pharmacist if you have questions. COMMON BRAND NAME(S): Megace What should I tell my health care provider before I take this medicine? They need to know if you have any of these conditions: -adrenal gland problems -history of blood clots of the legs, lungs, or other parts of the body -diabetes -kidney disease -liver disease -stroke -an unusual or allergic reaction to megestrol, other medicines, foods, dyes, or preservatives -pregnant or trying to get pregnant -breast-feeding How should I use this medicine? Take this medicine by mouth. Follow the directions on the prescription label. Do not take your medicine more often than directed. Take your doses at regular intervals. Do not stop taking except on the advice of your doctor or health care professional. Talk to your pediatrician regarding the use of this medicine in children. Special care may be needed. Overdosage: If you think you have taken too much of this medicine contact a poison control center or emergency room at once. NOTE: This medicine is only for you. Do not share this medicine with others. What if I miss a dose? If you miss a dose, take it as soon as you can. If it is almost time for your next dose, take only that dose. Do not take double or extra doses. What may interact with this medicine? Do not take this medicine with any of the following medications: -dofetilide This medicine may also interact with the following medications: -carbamazepine -indinavir -phenobarbital -phenytoin -primidone -rifampin -warfarin This list may not describe all possible interactions. Give your health care provider a list of all the medicines, herbs, non-prescription drugs, or  dietary supplements you use. Also tell them if you smoke, drink alcohol, or use illegal drugs. Some items may interact with your medicine. What should I watch for while using this medicine? Visit your doctor or health care professional for regular checks on your progress. Continue taking this medicine even if you feel better. It may take 2 months of regular use before you know if this medicine is working for your condition. If you are a female of child-bearing age, use an effective method of birth control while you are taking this medicine. This medicine should not be used by females who are pregnant or breast-feeding. There is a potential for serious side effects to an unborn child or to an infant. Talk to your health care professional or pharmacist for more information. If you have diabetes, this medicine may affect blood sugar levels. Check your blood sugar and talk to your doctor or health care professional if you notice changes. What side effects may I notice from receiving this medicine? Side effects that you should report to your doctor or health care professional as soon as possible: -difficulty breathing or shortness of breath -chest pain -dizziness -fluid retention -increased blood pressure -leg pain or swelling -nausea and vomiting -skin rash or itching -weakness Side effects that usually do not require medical attention (report to your doctor or health care professional if they continue or are bothersome): -breakthrough menstrual bleeding -hot flashes or flushing -increased appetite -mood changes -sweating -weight gain This list may not describe all possible side effects. Call your doctor for medical advice about side effects. You may report side effects to FDA at 1-800-FDA-1088. Where should I keep  my medicine? Keep out of the reach of children. Store at controlled room temperature between 15 and 30 degrees C (59 and 86 degrees F). Protect from heat above 40 degrees C (104  degrees F). Throw away any unused medicine after the expiration date. NOTE: This sheet is a summary. It may not cover all possible information. If you have questions about this medicine, talk to your doctor, pharmacist, or health care provider.  2018 Elsevier/Gold Standard (2007-10-09 15:57:10)

## 2016-09-16 NOTE — Patient Outreach (Signed)
[facility]) Care Management  09/16/2016  [name] 11-24-1940 [id number]  I spoke with [name] and her daughter [name] late yesterday re: plan for a return visit today which was to include lab work. Dr. Luan Pulling is out of the office this week and [name] agrees to await Dr. Luan Pulling' return prior to my next visit so that labs she believes he would like to get might be ordered.   [name] and her daughter asked about her nutrition plan which included trying to eat even small amounts every 2 hours. [name] asked if [name] should be awakened to eat and I advised that [name] be allowed to rest and not awakened to eat during the day.   Plan: I will follow up re: nutrition referral and will reach out to Dr. Luan Pulling and Mrs. Felix on Monday.    [facility]  [phone]

## 2016-09-18 ENCOUNTER — Inpatient Hospital Stay (HOSPITAL_COMMUNITY)
Admission: EM | Admit: 2016-09-18 | Discharge: 2016-10-03 | DRG: 871 | Disposition: E | Payer: PPO | Attending: Internal Medicine | Admitting: Internal Medicine

## 2016-09-18 ENCOUNTER — Encounter (HOSPITAL_COMMUNITY): Payer: Self-pay | Admitting: *Deleted

## 2016-09-18 ENCOUNTER — Emergency Department (HOSPITAL_COMMUNITY): Payer: PPO

## 2016-09-18 DIAGNOSIS — H547 Unspecified visual loss: Secondary | ICD-10-CM | POA: Diagnosis present

## 2016-09-18 DIAGNOSIS — J9602 Acute respiratory failure with hypercapnia: Secondary | ICD-10-CM

## 2016-09-18 DIAGNOSIS — C3410 Malignant neoplasm of upper lobe, unspecified bronchus or lung: Secondary | ICD-10-CM | POA: Diagnosis not present

## 2016-09-18 DIAGNOSIS — J449 Chronic obstructive pulmonary disease, unspecified: Secondary | ICD-10-CM | POA: Diagnosis present

## 2016-09-18 DIAGNOSIS — Z79899 Other long term (current) drug therapy: Secondary | ICD-10-CM

## 2016-09-18 DIAGNOSIS — Z515 Encounter for palliative care: Secondary | ICD-10-CM | POA: Diagnosis not present

## 2016-09-18 DIAGNOSIS — N189 Chronic kidney disease, unspecified: Secondary | ICD-10-CM | POA: Diagnosis not present

## 2016-09-18 DIAGNOSIS — Z880 Allergy status to penicillin: Secondary | ICD-10-CM

## 2016-09-18 DIAGNOSIS — Z7189 Other specified counseling: Secondary | ICD-10-CM | POA: Diagnosis not present

## 2016-09-18 DIAGNOSIS — J189 Pneumonia, unspecified organism: Secondary | ICD-10-CM | POA: Diagnosis not present

## 2016-09-18 DIAGNOSIS — N170 Acute kidney failure with tubular necrosis: Secondary | ICD-10-CM | POA: Diagnosis present

## 2016-09-18 DIAGNOSIS — G9341 Metabolic encephalopathy: Secondary | ICD-10-CM | POA: Diagnosis not present

## 2016-09-18 DIAGNOSIS — I714 Abdominal aortic aneurysm, without rupture, unspecified: Secondary | ICD-10-CM | POA: Diagnosis present

## 2016-09-18 DIAGNOSIS — E860 Dehydration: Secondary | ICD-10-CM | POA: Diagnosis not present

## 2016-09-18 DIAGNOSIS — R627 Adult failure to thrive: Secondary | ICD-10-CM | POA: Diagnosis present

## 2016-09-18 DIAGNOSIS — Z8249 Family history of ischemic heart disease and other diseases of the circulatory system: Secondary | ICD-10-CM

## 2016-09-18 DIAGNOSIS — I959 Hypotension, unspecified: Secondary | ICD-10-CM | POA: Diagnosis present

## 2016-09-18 DIAGNOSIS — Z90710 Acquired absence of both cervix and uterus: Secondary | ICD-10-CM | POA: Diagnosis not present

## 2016-09-18 DIAGNOSIS — J90 Pleural effusion, not elsewhere classified: Secondary | ICD-10-CM | POA: Diagnosis not present

## 2016-09-18 DIAGNOSIS — J9621 Acute and chronic respiratory failure with hypoxia: Secondary | ICD-10-CM | POA: Diagnosis not present

## 2016-09-18 DIAGNOSIS — I129 Hypertensive chronic kidney disease with stage 1 through stage 4 chronic kidney disease, or unspecified chronic kidney disease: Secondary | ICD-10-CM | POA: Diagnosis not present

## 2016-09-18 DIAGNOSIS — D638 Anemia in other chronic diseases classified elsewhere: Secondary | ICD-10-CM | POA: Diagnosis not present

## 2016-09-18 DIAGNOSIS — R531 Weakness: Secondary | ICD-10-CM | POA: Diagnosis not present

## 2016-09-18 DIAGNOSIS — R6521 Severe sepsis with septic shock: Secondary | ICD-10-CM | POA: Diagnosis present

## 2016-09-18 DIAGNOSIS — Z87891 Personal history of nicotine dependence: Secondary | ICD-10-CM

## 2016-09-18 DIAGNOSIS — Z8673 Personal history of transient ischemic attack (TIA), and cerebral infarction without residual deficits: Secondary | ICD-10-CM | POA: Diagnosis not present

## 2016-09-18 DIAGNOSIS — C3411 Malignant neoplasm of upper lobe, right bronchus or lung: Secondary | ICD-10-CM | POA: Diagnosis present

## 2016-09-18 DIAGNOSIS — Z9049 Acquired absence of other specified parts of digestive tract: Secondary | ICD-10-CM | POA: Diagnosis not present

## 2016-09-18 DIAGNOSIS — Z809 Family history of malignant neoplasm, unspecified: Secondary | ICD-10-CM

## 2016-09-18 DIAGNOSIS — D510 Vitamin B12 deficiency anemia due to intrinsic factor deficiency: Secondary | ICD-10-CM | POA: Diagnosis present

## 2016-09-18 DIAGNOSIS — Z881 Allergy status to other antibiotic agents status: Secondary | ICD-10-CM

## 2016-09-18 DIAGNOSIS — J9610 Chronic respiratory failure, unspecified whether with hypoxia or hypercapnia: Secondary | ICD-10-CM | POA: Diagnosis present

## 2016-09-18 DIAGNOSIS — Z452 Encounter for adjustment and management of vascular access device: Secondary | ICD-10-CM

## 2016-09-18 DIAGNOSIS — A419 Sepsis, unspecified organism: Principal | ICD-10-CM | POA: Diagnosis present

## 2016-09-18 DIAGNOSIS — J9601 Acute respiratory failure with hypoxia: Secondary | ICD-10-CM | POA: Diagnosis not present

## 2016-09-18 DIAGNOSIS — E43 Unspecified severe protein-calorie malnutrition: Secondary | ICD-10-CM | POA: Diagnosis not present

## 2016-09-18 DIAGNOSIS — Z888 Allergy status to other drugs, medicaments and biological substances status: Secondary | ICD-10-CM

## 2016-09-18 DIAGNOSIS — Z66 Do not resuscitate: Secondary | ICD-10-CM | POA: Diagnosis not present

## 2016-09-18 DIAGNOSIS — N179 Acute kidney failure, unspecified: Secondary | ICD-10-CM | POA: Diagnosis present

## 2016-09-18 DIAGNOSIS — R404 Transient alteration of awareness: Secondary | ICD-10-CM | POA: Diagnosis not present

## 2016-09-18 DIAGNOSIS — Z681 Body mass index (BMI) 19 or less, adult: Secondary | ICD-10-CM | POA: Diagnosis not present

## 2016-09-18 DIAGNOSIS — J9622 Acute and chronic respiratory failure with hypercapnia: Secondary | ICD-10-CM | POA: Diagnosis not present

## 2016-09-18 DIAGNOSIS — R0902 Hypoxemia: Secondary | ICD-10-CM

## 2016-09-18 DIAGNOSIS — Z7951 Long term (current) use of inhaled steroids: Secondary | ICD-10-CM

## 2016-09-18 LAB — CBC
HEMATOCRIT: 26.9 % — AB (ref 36.0–46.0)
Hemoglobin: 8.7 g/dL — ABNORMAL LOW (ref 12.0–15.0)
MCH: 29 pg (ref 26.0–34.0)
MCHC: 32.3 g/dL (ref 30.0–36.0)
MCV: 89.7 fL (ref 78.0–100.0)
PLATELETS: 111 10*3/uL — AB (ref 150–400)
RBC: 3 MIL/uL — ABNORMAL LOW (ref 3.87–5.11)
RDW: 18.3 % — AB (ref 11.5–15.5)
WBC: 3.1 10*3/uL — AB (ref 4.0–10.5)

## 2016-09-18 LAB — URINALYSIS, ROUTINE W REFLEX MICROSCOPIC
BILIRUBIN URINE: NEGATIVE
GLUCOSE, UA: NEGATIVE mg/dL
HGB URINE DIPSTICK: NEGATIVE
KETONES UR: NEGATIVE mg/dL
LEUKOCYTES UA: NEGATIVE
Nitrite: NEGATIVE
PH: 5 (ref 5.0–8.0)
PROTEIN: NEGATIVE mg/dL
Specific Gravity, Urine: 1.013 (ref 1.005–1.030)

## 2016-09-18 LAB — HEPATIC FUNCTION PANEL
ALBUMIN: 1.4 g/dL — AB (ref 3.5–5.0)
ALK PHOS: 192 U/L — AB (ref 38–126)
ALT: 14 U/L (ref 14–54)
AST: 24 U/L (ref 15–41)
BILIRUBIN DIRECT: 0.1 mg/dL (ref 0.1–0.5)
BILIRUBIN INDIRECT: 0.5 mg/dL (ref 0.3–0.9)
BILIRUBIN TOTAL: 0.6 mg/dL (ref 0.3–1.2)
Total Protein: 4.1 g/dL — ABNORMAL LOW (ref 6.5–8.1)

## 2016-09-18 LAB — BASIC METABOLIC PANEL
Anion gap: 8 (ref 5–15)
BUN: 31 mg/dL — AB (ref 6–20)
CHLORIDE: 108 mmol/L (ref 101–111)
CO2: 23 mmol/L (ref 22–32)
CREATININE: 1.23 mg/dL — AB (ref 0.44–1.00)
Calcium: 7.6 mg/dL — ABNORMAL LOW (ref 8.9–10.3)
GFR calc Af Amer: 49 mL/min — ABNORMAL LOW (ref >60)
GFR calc non Af Amer: 42 mL/min — ABNORMAL LOW (ref >60)
GLUCOSE: 114 mg/dL — AB (ref 65–99)
POTASSIUM: 4.4 mmol/L (ref 3.5–5.1)
SODIUM: 139 mmol/L (ref 135–145)

## 2016-09-18 LAB — LACTIC ACID, PLASMA: Lactic Acid, Venous: 1.6 mmol/L (ref 0.5–1.9)

## 2016-09-18 LAB — TROPONIN I

## 2016-09-18 LAB — CBG MONITORING, ED: Glucose-Capillary: 112 mg/dL — ABNORMAL HIGH (ref 65–99)

## 2016-09-18 MED ORDER — SODIUM CHLORIDE 0.9 % IV BOLUS (SEPSIS)
500.0000 mL | Freq: Once | INTRAVENOUS | Status: AC
  Administered 2016-09-18: 500 mL via INTRAVENOUS

## 2016-09-18 MED ORDER — SODIUM CHLORIDE 0.9 % IV BOLUS (SEPSIS)
1000.0000 mL | Freq: Once | INTRAVENOUS | Status: AC
  Administered 2016-09-18: 1000 mL via INTRAVENOUS

## 2016-09-18 MED ORDER — SODIUM CHLORIDE 0.9 % IV BOLUS (SEPSIS)
500.0000 mL | Freq: Once | INTRAVENOUS | Status: DC
  Administered 2016-09-18: 500 mL via INTRAVENOUS

## 2016-09-18 MED ORDER — SODIUM CHLORIDE 0.9 % IV BOLUS (SEPSIS)
500.0000 mL | Freq: Once | INTRAVENOUS | Status: DC
Start: 2016-09-18 — End: 2016-09-18

## 2016-09-18 NOTE — ED Provider Notes (Signed)
[facility] DEPT Provider Note   CSN: [id number] Arrival date & time: 09/05/2016  1841     History   Chief Complaint Chief Complaint  Patient presents with  . Weakness    HPI [name] is a 76 y.o. female.  HPI Patient presents with generalized weakness for the last couple weeks. Has had a little bit of cough but has chronic cough with the history of lung cancer. she has had a decreased appetite and diarrhea. States has had diarrhea a few times a day. No recent antibiotics. States that is reportedly a side effect of her oral chemotherapy for her lung cancer. Was felt weak. No chest pain. Does have decreased energy level. Patient's daughter requests that B 12-lead onset she's been on. Past Medical History:  Diagnosis Date  . AAA (abdominal aortic aneurysm) ([place])   . Anemia of chronic disease 11/14/2014  . Chronic kidney disease   . COPD (chronic obstructive pulmonary disease) ([place])   . Hypertension   . Lung cancer ([place])   . Pernicious anemia   . Primary cancer of right upper lobe of lung ([place]) 05/10/2012  . Stroke ([place])   . Thyroid disease     Patient Active Problem List   Diagnosis Date Noted  . Hypertension 05/18/2016  . AAA (abdominal aortic aneurysm) ([place]) 03/04/2015  . Swelling of right lower extremity 03/04/2015  . Anemia of chronic disease 11/14/2014  . Hypokalemia 11/14/2014  . Essential hypertension 11/14/2014  . Acute right PCA stroke ([place]) 11/13/2014  . CVA (cerebral infarction) 11/13/2014  . COPD (chronic obstructive pulmonary disease) ([place]) 11/13/2014  . Chronic respiratory failure ([place]) 11/13/2014  . Cellulitis of right leg 09/23/2014  . Encounter for antineoplastic chemotherapy 09/23/2014  . Mucosal abnormality of stomach   . Esophageal ring   . Special screening for malignant neoplasms, colon   . Dysphagia, pharyngoesophageal phase 04/19/2014  . Encounter for screening colonoscopy 04/19/2014  . Spontaneous pneumothorax 05/10/2012  . Primary cancer of  right upper lobe of lung ([place]) 05/10/2012    Past Surgical History:  Procedure Laterality Date  . ABDOMINAL HYSTERECTOMY    . APPENDECTOMY    . CHEST TUBE INSERTION  03/08/2012   Procedure: CHEST TUBE INSERTION;  Surgeon: Ivin Poot, MD;  Location: [facility];  Service: Thoracic;  Laterality: Right;  . CHOLECYSTECTOMY    . COLONOSCOPY N/A 04/29/2014   Procedure: COLONOSCOPY;  Surgeon: Daneil Dolin, MD;  Location[facility];  Service: Endoscopy;  Laterality: N/A;  1245pm  . ESOPHAGOGASTRODUODENOSCOPY N/A 04/29/2014   Procedure: ESOPHAGOGASTRODUODENOSCOPY (EGD);  Surgeon: Daneil Dolin, MD;  [facility];  Service: Endoscopy;  Laterality: N/A;  Venia Minks DILATION N/A 04/29/2014   Procedure: Venia Minks DILATION;  Surgeon: Daneil Dolin, MD;  [facility];  Service: Endoscopy;  Laterality: N/A;  . RESECTION OF APICAL BLEB  03/17/2012   Procedure: RESECTION OF APICAL BLEB;  Surgeon: Ivin Poot, MD;  Location[facility] OR;  Service: Thoracic;  Laterality: Right;  stapling of bleb  . SAVORY DILATION N/A 04/29/2014   Procedure: SAVORY DILATION;  Surgeon: Daneil Dolin, MD;  [facility];  Service: Endoscopy;  Laterality: N/A;  . TEE WITHOUT CARDIOVERSION N/A 11/15/2014   Procedure: TRANSESOPHAGEAL ECHOCARDIOGRAM (TEE);  Surgeon: Sueanne Margarita, MD;  Location: [facility] ENDOSCOPY;  Service: Cardiovascular;  Laterality: N/A;  . TUBAL LIGATION    . VIDEO ASSISTED THORACOSCOPY  03/17/2012   Procedure: VIDEO ASSISTED THORACOSCOPY;  Surgeon: Tharon Aquas  Kerby Less, MD;  Location: [facility] OR;  Service: Thoracic;  Laterality: Right;    OB History    Gravida Para Term Preterm AB Living   4 4 2 2        SAB TAB Ectopic Multiple Live Births                   Home Medications    Prior to Admission medications   Medication Sig Start Date End Date Taking? Authorizing Provider  acetaminophen (TYLENOL) 500 MG tablet Take 500-1,000 mg by mouth every 6 (six) hours as needed for moderate  pain.     [provider]  albuterol (PROVENTIL) (2.5 MG/3ML) 0.083% nebulizer solution Take 2.5 mg by nebulization every 4 (four) hours. Or Four times daily    [provider]  ALPRAZolam (XANAX) 1 MG tablet Take 1 mg by mouth 4 (four) times daily as needed. For anxiety and/or sleep    [provider]  amLODipine (NORVASC) 2.5 MG tablet Take 2.5 mg by mouth daily. 07/24/15   [provider]  clindamycin (CLEOCIN T) 1 % external solution Apply topically 2 (two) times daily. Patient not taking: Reported on 09/14/2016 06/03/14   Carlton Adam, PA-C  cyanocobalamin 2000 MCG tablet Take 2,000 mcg by mouth daily.    [provider]  ferrous sulfate 325 (65 FE) MG tablet Take 325 mg by mouth 3 (three) times daily with meals.    [provider]  Fluticasone-Salmeterol (ADVAIR) 250-50 MCG/DOSE AEPB Inhale 1 puff into the lungs every 12 (twelve) hours.    [provider]  guaiFENesin (MUCINEX) 600 MG 12 hr tablet Take 600 mg by mouth daily as needed for cough.     [provider]  levothyroxine (SYNTHROID, LEVOTHROID) 100 MCG tablet Take 100 mcg by mouth daily before breakfast.     [provider]  loperamide (IMODIUM) 2 MG capsule Take 2 mg by mouth as needed for diarrhea or loose stools.    [provider]  loratadine (CLARITIN) 10 MG tablet Take 10 mg by mouth daily as needed for allergies.     [provider]  mirtazapine (REMERON) 7.5 MG tablet Take 7.5 mg by mouth at bedtime. 02/20/15   [provider]  NON FORMULARY OXYGEN      24/7    [provider]  osimertinib mesylate (TAGRISSO) 80 MG tablet Take 1 tablet (80 mg total) by mouth daily. 09/06/16   Curt Bears, MD  potassium chloride SA (K-DUR,KLOR-CON) 20 MEQ tablet Take 1 tablet (20 mEq total) by mouth daily. Patient not taking: Reported on 09/14/2016 03/04/15   Curt Bears, MD  prochlorperazine (COMPAZINE) 10 MG tablet  Take 1 tablet (10 mg total) by mouth every 6 (six) hours as needed for nausea or vomiting. 08/17/16   Curt Bears, MD  torsemide (DEMADEX) 10 MG tablet Take 10 mg by mouth. 02/11/16   [provider]  traMADol (ULTRAM) 50 MG tablet Take 1 tablet (50 mg total) by mouth every 12 (twelve) hours as needed. Patient not taking: Reported on 07/19/2016 07/06/16   Curt Bears, MD    Family History Family History  Problem Relation Age of Onset  . Cancer Mother   . Heart failure Brother   . Heart disease Brother        before age 25  . Colon cancer Neg Hx     Social History Social History  Substance Use Topics  . Smoking status: Former Smoker    Types:  Cigarettes    Quit date: 04/05/2006  . Smokeless tobacco: Former Systems developer    Quit date: 03/07/2006  . Alcohol use No     Allergies   Codeine; Penicillins; Doxycycline; Pravastatin sodium; and Sulfa antibiotics   Review of Systems Review of Systems  Constitutional: Positive for appetite change. Negative for fatigue.  HENT: Negative for congestion.   Respiratory: Positive for cough.   Gastrointestinal: Negative for abdominal pain.  Musculoskeletal: Negative for back pain.     Physical Exam Updated Vital Signs BP (!) 87/62 (BP Location: Left Arm)   Pulse 84   Temp 97.4 F (36.3 C) (Oral)   Resp 18   Ht 5\' 4"  (1.626 m)   Wt 37.5 kg (82 lb 9 oz)   SpO2 96%   BMI 14.17 kg/m   Physical Exam  Constitutional: She appears well-developed.  HENT:  Head: Atraumatic.  Neck: Neck supple. No JVD present.  Cardiovascular: Normal rate.   Pulmonary/Chest:  Harsh breath sounds on right greater than left.  Abdominal: There is no tenderness.  Musculoskeletal: She exhibits no edema.  Neurological: She is alert.  Skin: Capillary refill takes less than 2 seconds. There is pallor.     ED Treatments / Results  Labs (all labs ordered are listed, but only abnormal results are displayed) Labs Reviewed  CBG MONITORING, ED -  Abnormal; Notable for the following:       Result Value   Glucose-Capillary 112 (*)    All other components within normal limits  BASIC METABOLIC PANEL  CBC  URINALYSIS, ROUTINE W REFLEX MICROSCOPIC  VITAMIN B12  HEPATIC FUNCTION PANEL  TROPONIN I    EKG  EKG Interpretation  Date/Time:  Saturday September 18 2016 18:45:05 EDT Ventricular Rate:  84 PR Interval:    QRS Duration: 92 QT Interval:  380 QTC Calculation: 450 R Axis:   35 Text Interpretation:  Sinus rhythm Borderline short PR interval Nonspecific T abnrm, anterolateral leads Confirmed by Alvino Chapel  MD, Dayra Rapley [phone]) on 09/03/2016 7:26:26 PM       Radiology Dg Chest 2 View  Result Date: 10/01/2016 CLINICAL DATA:  Weakness for 1 and half weeks. EXAM: CHEST  2 VIEW COMPARISON:  07/09/2016 FINDINGS: The cardiac silhouette is mildly enlarged. Mediastinal contours appear intact. There are bilateral pleural effusions with interstitial pulmonary edema. Postsurgical scarring in the right upper thorax is exaggerated by increased interstitial markings, and more nodular in appearance. No evidence of pneumothorax. Osseous structures are without acute abnormality. Soft tissues are grossly normal. IMPRESSION: Bilateral pleural effusion and interstitial pulmonary edema. Exaggerated linear and nodular thickening in the right upper thorax, may represent postsurgical changes exaggerated by pulmonary edema or focal recurrence at lobectomy site. Electronically Signed   By: Fidela Salisbury M.D.   On: 09/12/2016 20:10    Procedures Procedures (including critical care time)  Medications Ordered in ED Medications  sodium chloride 0.9 % bolus 500 mL (0 mLs Intravenous Stopped 09/28/2016 1944)     Initial Impression / Assessment and Plan / ED Course  I have reviewed the triage vital signs and the nursing notes.  Pertinent labs & imaging results that were available during my care of the patient were reviewed by me and considered in my medical  decision making (see chart for details).     Patient with generalized weakness. Has had diarrhea a little bit of cough. Cough is really unchanged however. Decreased oral intake. I think she'll probably require admission. Has hypotension but is afebrile. No recent antibiotics.  Care turned over to Dr. Wilson Singer  Final Clinical Impressions(s) / ED Diagnoses   Final diagnoses:  Weakness  Dehydration    New Prescriptions New Prescriptions   No medications on file     Davonna Belling, MD 10/01/2016 2027

## 2016-09-18 NOTE — ED Notes (Signed)
Informed pt and pt's family of need for urine sample. Pt states that she does not have to go at this time but will inform nursing staff when able to provide sample

## 2016-09-18 NOTE — ED Provider Notes (Signed)
By signing my name below, I, Dyke Brackett, attest that this documentation has been prepared under the direction and in the presence of Virgel Manifold, MD . Electronically Signed: Dyke Brackett, Scribe. 09/28/2016. 9:43 PM.   [name] is an 76 y.o. female with a history of lung cancer, COPD and CKD who presents to the Emergency Department complaining of gradually worsening generalized weakness onset about 1.5 weeks ago. Pt and family members report decreased appetite, fatigue, and diarrhea. Pt was started on oral chemo medication a few months ago She notes chronic unchanging cough. She also reports some back pain, but pt thinks this is due to a prior fall in 03/48.  Pt denies any nausea, vomiting, or chest pain.   On exam she appears frail, but not acutely ill. Mild tachypnea. Coarse breath sounds b/l. Palpable abdominal aorta which seems large but probably also accentuated by her very small habitus. 5cm on last imaging a couple months ago. No abdominal pain. No claudication symptoms. Some back pain but has been ongoing since fall months ago. I think hypotension from dehydration. Cr only 1.23 but she is only 82 lbs and baseline 0.7-0.8. She has been increasingly anorexic and had recent diarrhea. She is afebrile. Mild leukopenia which appears chronic or at least subacute. Anemia is stable. Lactic acid normal. BP on 6/12 recorded as 90/62 although looking further back she seems to normally be normotensive at baseline. Additional IVF. UA pending. Admit.    Virgel Manifold, MD 09/05/2016 2326

## 2016-09-18 NOTE — ED Triage Notes (Signed)
Pt reports feeling weakness x 1.5 weeks.  Pt says she coughs up clear/white/frothy sputum from throat.  Denies that sputum comes from lungs.  Pt hx of lung Cancer with 1/3 of right lung removed. Family report possible dehydration.

## 2016-09-18 NOTE — ED Notes (Addendum)
Md notified of B/P 86/62 Pt still had 538ml in bag. Bolus started.

## 2016-09-19 ENCOUNTER — Inpatient Hospital Stay (HOSPITAL_COMMUNITY): Payer: PPO

## 2016-09-19 DIAGNOSIS — G9341 Metabolic encephalopathy: Secondary | ICD-10-CM | POA: Diagnosis not present

## 2016-09-19 DIAGNOSIS — Z9049 Acquired absence of other specified parts of digestive tract: Secondary | ICD-10-CM | POA: Diagnosis not present

## 2016-09-19 DIAGNOSIS — N170 Acute kidney failure with tubular necrosis: Secondary | ICD-10-CM | POA: Diagnosis not present

## 2016-09-19 DIAGNOSIS — J189 Pneumonia, unspecified organism: Secondary | ICD-10-CM | POA: Diagnosis not present

## 2016-09-19 DIAGNOSIS — R531 Weakness: Secondary | ICD-10-CM

## 2016-09-19 DIAGNOSIS — J9602 Acute respiratory failure with hypercapnia: Secondary | ICD-10-CM | POA: Diagnosis not present

## 2016-09-19 DIAGNOSIS — R6521 Severe sepsis with septic shock: Secondary | ICD-10-CM

## 2016-09-19 DIAGNOSIS — A419 Sepsis, unspecified organism: Principal | ICD-10-CM

## 2016-09-19 DIAGNOSIS — D638 Anemia in other chronic diseases classified elsewhere: Secondary | ICD-10-CM

## 2016-09-19 DIAGNOSIS — I129 Hypertensive chronic kidney disease with stage 1 through stage 4 chronic kidney disease, or unspecified chronic kidney disease: Secondary | ICD-10-CM | POA: Diagnosis not present

## 2016-09-19 DIAGNOSIS — H547 Unspecified visual loss: Secondary | ICD-10-CM | POA: Diagnosis not present

## 2016-09-19 DIAGNOSIS — E43 Unspecified severe protein-calorie malnutrition: Secondary | ICD-10-CM | POA: Diagnosis not present

## 2016-09-19 DIAGNOSIS — N189 Chronic kidney disease, unspecified: Secondary | ICD-10-CM | POA: Diagnosis not present

## 2016-09-19 DIAGNOSIS — Z90710 Acquired absence of both cervix and uterus: Secondary | ICD-10-CM | POA: Diagnosis not present

## 2016-09-19 DIAGNOSIS — Z515 Encounter for palliative care: Secondary | ICD-10-CM | POA: Diagnosis not present

## 2016-09-19 DIAGNOSIS — D510 Vitamin B12 deficiency anemia due to intrinsic factor deficiency: Secondary | ICD-10-CM | POA: Diagnosis not present

## 2016-09-19 DIAGNOSIS — I959 Hypotension, unspecified: Secondary | ICD-10-CM | POA: Diagnosis not present

## 2016-09-19 DIAGNOSIS — Z66 Do not resuscitate: Secondary | ICD-10-CM | POA: Diagnosis not present

## 2016-09-19 DIAGNOSIS — C3410 Malignant neoplasm of upper lobe, unspecified bronchus or lung: Secondary | ICD-10-CM | POA: Diagnosis not present

## 2016-09-19 DIAGNOSIS — Z681 Body mass index (BMI) 19 or less, adult: Secondary | ICD-10-CM | POA: Diagnosis not present

## 2016-09-19 DIAGNOSIS — J9601 Acute respiratory failure with hypoxia: Secondary | ICD-10-CM

## 2016-09-19 DIAGNOSIS — C3411 Malignant neoplasm of upper lobe, right bronchus or lung: Secondary | ICD-10-CM | POA: Diagnosis not present

## 2016-09-19 DIAGNOSIS — N179 Acute kidney failure, unspecified: Secondary | ICD-10-CM | POA: Diagnosis not present

## 2016-09-19 DIAGNOSIS — Z8673 Personal history of transient ischemic attack (TIA), and cerebral infarction without residual deficits: Secondary | ICD-10-CM | POA: Diagnosis not present

## 2016-09-19 DIAGNOSIS — Z452 Encounter for adjustment and management of vascular access device: Secondary | ICD-10-CM | POA: Diagnosis not present

## 2016-09-19 DIAGNOSIS — R627 Adult failure to thrive: Secondary | ICD-10-CM | POA: Diagnosis not present

## 2016-09-19 DIAGNOSIS — E860 Dehydration: Secondary | ICD-10-CM | POA: Diagnosis not present

## 2016-09-19 DIAGNOSIS — J449 Chronic obstructive pulmonary disease, unspecified: Secondary | ICD-10-CM | POA: Diagnosis not present

## 2016-09-19 DIAGNOSIS — Z7189 Other specified counseling: Secondary | ICD-10-CM | POA: Diagnosis not present

## 2016-09-19 DIAGNOSIS — J9622 Acute and chronic respiratory failure with hypercapnia: Secondary | ICD-10-CM | POA: Diagnosis not present

## 2016-09-19 DIAGNOSIS — I714 Abdominal aortic aneurysm, without rupture: Secondary | ICD-10-CM | POA: Diagnosis not present

## 2016-09-19 DIAGNOSIS — J9621 Acute and chronic respiratory failure with hypoxia: Secondary | ICD-10-CM | POA: Diagnosis not present

## 2016-09-19 LAB — PROCALCITONIN
Procalcitonin: 60.84 ng/mL
Procalcitonin: 51.96 ng/mL

## 2016-09-19 LAB — CBC
HEMATOCRIT: 27 % — AB (ref 36.0–46.0)
Hemoglobin: 8.7 g/dL — ABNORMAL LOW (ref 12.0–15.0)
MCH: 29.3 pg (ref 26.0–34.0)
MCHC: 32.2 g/dL (ref 30.0–36.0)
MCV: 90.9 fL (ref 78.0–100.0)
Platelets: 132 10*3/uL — ABNORMAL LOW (ref 150–400)
RBC: 2.97 MIL/uL — AB (ref 3.87–5.11)
RDW: 18.4 % — ABNORMAL HIGH (ref 11.5–15.5)
WBC: 5 10*3/uL (ref 4.0–10.5)

## 2016-09-19 LAB — MRSA PCR SCREENING: MRSA BY PCR: NEGATIVE

## 2016-09-19 LAB — BLOOD GAS, ARTERIAL
Acid-base deficit: 10.1 mmol/L — ABNORMAL HIGH (ref 0.0–2.0)
Bicarbonate: 15.5 mmol/L — ABNORMAL LOW (ref 20.0–28.0)
O2 Content: 4 L/min
O2 Saturation: 92.2 %
PATIENT TEMPERATURE: 37
PH ART: 7.103 — AB (ref 7.350–7.450)
PO2 ART: 69 mmHg — AB (ref 83.0–108.0)
pCO2 arterial: 60.3 mmHg — ABNORMAL HIGH (ref 32.0–48.0)

## 2016-09-19 LAB — BASIC METABOLIC PANEL
ANION GAP: 6 (ref 5–15)
BUN: 28 mg/dL — ABNORMAL HIGH (ref 6–20)
CALCIUM: 7.3 mg/dL — AB (ref 8.9–10.3)
CHLORIDE: 111 mmol/L (ref 101–111)
CO2: 21 mmol/L — AB (ref 22–32)
Creatinine, Ser: 1.09 mg/dL — ABNORMAL HIGH (ref 0.44–1.00)
GFR calc non Af Amer: 49 mL/min — ABNORMAL LOW (ref >60)
GFR, EST AFRICAN AMERICAN: 57 mL/min — AB (ref >60)
GLUCOSE: 87 mg/dL (ref 65–99)
POTASSIUM: 4.3 mmol/L (ref 3.5–5.1)
Sodium: 138 mmol/L (ref 135–145)

## 2016-09-19 LAB — MAGNESIUM: MAGNESIUM: 1.8 mg/dL (ref 1.7–2.4)

## 2016-09-19 LAB — PHOSPHORUS: Phosphorus: 3.5 mg/dL (ref 2.5–4.6)

## 2016-09-19 LAB — VITAMIN B12: VITAMIN B 12: 2707 pg/mL — AB (ref 180–914)

## 2016-09-19 MED ORDER — VANCOMYCIN HCL 500 MG IV SOLR
500.0000 mg | Freq: Two times a day (BID) | INTRAVENOUS | Status: DC
  Administered 2016-09-19 – 2016-09-20 (×2): 500 mg via INTRAVENOUS
  Filled 2016-09-19 (×4): qty 500

## 2016-09-19 MED ORDER — ALPRAZOLAM 0.5 MG PO TABS
1.0000 mg | ORAL_TABLET | Freq: Four times a day (QID) | ORAL | Status: DC | PRN
Start: 2016-09-19 — End: 2016-09-19
  Administered 2016-09-19: 1 mg via ORAL
  Filled 2016-09-19: qty 2

## 2016-09-19 MED ORDER — DEXTROSE 5 % IV SOLN
500.0000 mg | Freq: Three times a day (TID) | INTRAVENOUS | Status: DC
  Administered 2016-09-19 – 2016-09-20 (×4): 500 mg via INTRAVENOUS
  Filled 2016-09-19 (×7): qty 0.5

## 2016-09-19 MED ORDER — SODIUM CHLORIDE 0.9 % IV BOLUS (SEPSIS)
500.0000 mL | Freq: Once | INTRAVENOUS | Status: AC
  Administered 2016-09-19: 500 mL via INTRAVENOUS

## 2016-09-19 MED ORDER — SODIUM CHLORIDE 0.9 % IV BOLUS (SEPSIS)
1000.0000 mL | Freq: Once | INTRAVENOUS | Status: AC
  Administered 2016-09-19: 1000 mL via INTRAVENOUS

## 2016-09-19 MED ORDER — MIRTAZAPINE 15 MG PO TABS
7.5000 mg | ORAL_TABLET | Freq: Every day | ORAL | Status: DC
  Administered 2016-09-19: 7.5 mg via ORAL
  Filled 2016-09-19 (×2): qty 1

## 2016-09-19 MED ORDER — ENOXAPARIN SODIUM 30 MG/0.3ML ~~LOC~~ SOLN
20.0000 mg | SUBCUTANEOUS | Status: DC
  Administered 2016-09-19: 20 mg via SUBCUTANEOUS
  Filled 2016-09-19: qty 0.3

## 2016-09-19 MED ORDER — ORAL CARE MOUTH RINSE
15.0000 mL | Freq: Two times a day (BID) | OROMUCOSAL | Status: DC
  Administered 2016-09-19 – 2016-09-20 (×3): 15 mL via OROMUCOSAL

## 2016-09-19 MED ORDER — LEVOTHYROXINE SODIUM 100 MCG PO TABS
100.0000 ug | ORAL_TABLET | Freq: Every day | ORAL | Status: DC
  Filled 2016-09-19: qty 1

## 2016-09-19 MED ORDER — ALBUTEROL SULFATE (2.5 MG/3ML) 0.083% IN NEBU: 2.5000 mg | INHALATION_SOLUTION | Freq: Four times a day (QID) | RESPIRATORY_TRACT | Status: DC

## 2016-09-19 MED ORDER — LORAZEPAM 2 MG/ML IJ SOLN
1.0000 mg | INTRAMUSCULAR | Status: DC | PRN
  Administered 2016-09-20: 1 mg via INTRAVENOUS
  Filled 2016-09-19: qty 1

## 2016-09-19 MED ORDER — ALBUTEROL SULFATE (2.5 MG/3ML) 0.083% IN NEBU
2.5000 mg | INHALATION_SOLUTION | Freq: Four times a day (QID) | RESPIRATORY_TRACT | Status: DC
  Administered 2016-09-19 (×2): 2.5 mg via RESPIRATORY_TRACT
  Filled 2016-09-19 (×2): qty 3

## 2016-09-19 MED ORDER — PROCHLORPERAZINE MALEATE 5 MG PO TABS: 10.0000 mg | ORAL_TABLET | Freq: Four times a day (QID) | ORAL | Status: DC | PRN

## 2016-09-19 MED ORDER — MORPHINE SULFATE (PF) 2 MG/ML IV SOLN
1.0000 mg | INTRAVENOUS | Status: DC | PRN
  Administered 2016-09-19 – 2016-09-20 (×5): 1 mg via INTRAVENOUS
  Filled 2016-09-19 (×5): qty 1

## 2016-09-19 MED ORDER — VANCOMYCIN HCL IN DEXTROSE 750-5 MG/150ML-% IV SOLN
750.0000 mg | Freq: Once | INTRAVENOUS | Status: AC
  Administered 2016-09-19: 750 mg via INTRAVENOUS
  Filled 2016-09-19: qty 150

## 2016-09-19 MED ORDER — SCOPOLAMINE 1 MG/3DAYS TD PT72
1.0000 | MEDICATED_PATCH | TRANSDERMAL | Status: DC
  Administered 2016-09-19: 1.5 mg via TRANSDERMAL
  Filled 2016-09-19 (×2): qty 1

## 2016-09-19 MED ORDER — SODIUM CHLORIDE 0.9 % IV SOLN
INTRAVENOUS | Status: AC
  Administered 2016-09-19: 10:00:00 via INTRAVENOUS
  Administered 2016-09-19: 100 mL via INTRAVENOUS

## 2016-09-19 MED ORDER — MORPHINE SULFATE (PF) 2 MG/ML IV SOLN
INTRAVENOUS | Status: AC
  Administered 2016-09-19: 1 mg via INTRAVENOUS
  Filled 2016-09-19: qty 1

## 2016-09-19 NOTE — Progress Notes (Signed)
PROGRESS NOTE    [name]  IAX:[id number] DOB: 23-Jul-1940 DOA: 09/18/2016 PCP: Redmond School, MD     Brief Narrative:  76 year old woman admitted from home early this morning due to generalized weakness. She has a history of stage IV lung cancer she has not been eating or drinking well, has no appetite, was recently started on Megace. She was noted to be hypotensive with systolic blood pressures in the 70s to 80s and admission was requested. Overnight she has remained significantly hypotensive despite fluid boluses, she has become more lethargic. Pro-calcitonin of 60 which seems to indicate a systemic bacterial infection. Urinalysis and chest x-ray are not indicative of infection. Blood and urine cultures have been requested and are pending.   Assessment & Plan:   Principal Problem:   Severe sepsis with septic shock (HCC) Active Problems:   Primary cancer of right upper lobe of lung (HCC)   COPD (chronic obstructive pulmonary disease) (HCC)   Chronic respiratory failure (HCC)   Anemia of chronic disease   AAA (abdominal aortic aneurysm) (HCC)   Hypotension   AKI (acute kidney injury) ([place])   Dehydration   Weakness   Severe protein-calorie malnutrition (HCC)   Severe sepsis with septic shock -Will give another fluid bolus, if blood pressure remains below 90 will need to start pressors. -Will request PICC versus central line access in case pressors need to be started. -Start empiric broad-spectrum antibiotics: Vancomycin, Zosyn. -Blood cultures have been requested. -Very poor prognosis given age, stage IV lung cancer. Discussed in detail with daughter at bedside, for now she remains a full code until she can discuss with her other sisters. Explained that I do not believe she would survive a resuscitation effort. -Recheck pro-calcitonin in a.m.  Stage IV lung cancer -Tarceva on hold for now.  Acute metabolic encephalopathy -Due to sepsis, follow.  Acute renal failure -Due  to ATN with hypotension and sepsis, also component of prerenal azotemia. -Improved overnight with IV fluids.   DVT prophylaxis: lovenox Code Status: full code for now Family Communication: Discussed with daughter at bedside; updated on plan of care and all questions answered Disposition Plan: Remain in ICU, critically ill, May need pressors if remains hypotensive  Consultants:   None  Procedures:   None  Antimicrobials:  Anti-infectives    None       Subjective: Drowsy, opens eyes briefly but not able to answer simple questions.  Objective: Vitals:   09/19/16 0800 09/19/16 0810 09/19/16 0900 09/19/16 0910  BP: (!) 97/54  (!) 81/42   Pulse: 98  88 88  Resp: 14  15 20   Temp:      TempSrc:      SpO2: 93% 96% 99%   Weight:      Height:        Intake/Output Summary (Last 24 hours) at 09/19/16 0951 Last data filed at 09/19/16 0700  Gross per 24 hour  Intake          2483.33 ml  Output              250 ml  Net          2233.33 ml   Filed Weights   09/19/2016 1846 09/19/16 0230  Weight: 37.5 kg (82 lb 9 oz) 35.2 kg (77 lb 9.6 oz)    Examination:  General exam: lethargic Respiratory system: Clear to auscultation. Respiratory effort normal. Cardiovascular system:RRR. No murmurs, rubs, gallops. Gastrointestinal system: Abdomen is nondistended, soft and nontender. No organomegaly or  masses felt. Normal bowel sounds heard. Central nervous system: Moves all 4 spontaneously unable to fully assess neurologic status given her lethargy Extremities: No C/C/E, +pedal pulses Skin: No rashes, lesions or ulcers Psychiatry: Unable to assess given current mental state    Data Reviewed: I have personally reviewed following labs and imaging studies  CBC:  Recent Labs Lab 09/28/2016 2021 09/19/16 0229  WBC 3.1* 5.0  HGB 8.7* 8.7*  HCT 26.9* 27.0*  MCV 89.7 90.9  PLT 111* 440*   Basic Metabolic Panel:  Recent Labs Lab 10/01/2016 2021 09/19/16 0229  NA 139 138  K 4.4  4.3  CL 108 111  CO2 23 21*  GLUCOSE 114* 87  BUN 31* 28*  CREATININE 1.23* 1.09*  CALCIUM 7.6* 7.3*  MG  --  1.8  PHOS  --  3.5   GFR: Estimated Creatinine Clearance: 25.2 mL/min (A) (by C-G formula based on SCr of 1.09 mg/dL (H)). Liver Function Tests:  Recent Labs Lab 09/10/2016 2021  AST 24  ALT 14  ALKPHOS 192*  BILITOT 0.6  PROT 4.1*  ALBUMIN 1.4*   No results for input(s): LIPASE, AMYLASE in the last 168 hours. No results for input(s): AMMONIA in the last 168 hours. Coagulation Profile: No results for input(s): INR, PROTIME in the last 168 hours. Cardiac Enzymes:  Recent Labs Lab 09/17/2016 2021  TROPONINI <0.03   BNP (last 3 results) No results for input(s): PROBNP in the last 8760 hours. HbA1C: No results for input(s): HGBA1C in the last 72 hours. CBG:  Recent Labs Lab 09/06/2016 1917  GLUCAP 112*   Lipid Profile: No results for input(s): CHOL, HDL, LDLCALC, TRIG, CHOLHDL, LDLDIRECT in the last 72 hours. Thyroid Function Tests: No results for input(s): TSH, T4TOTAL, FREET4, T3FREE, THYROIDAB in the last 72 hours. Anemia Panel: No results for input(s): VITAMINB12, FOLATE, FERRITIN, TIBC, IRON, RETICCTPCT in the last 72 hours. Urine analysis:    Component Value Date/Time   COLORURINE YELLOW 09/11/2016 2330   APPEARANCEUR CLEAR 09/05/2016 2330   LABSPEC 1.013 09/13/2016 2330   PHURINE 5.0 09/03/2016 2330   GLUCOSEU NEGATIVE 09/23/2016 2330   HGBUR NEGATIVE 09/24/2016 2330   BILIRUBINUR NEGATIVE 09/06/2016 2330   KETONESUR NEGATIVE 09/26/2016 2330   PROTEINUR NEGATIVE 09/21/2016 2330   UROBILINOGEN 0.2 03/16/2012 1140   NITRITE NEGATIVE 09/15/2016 2330   LEUKOCYTESUR NEGATIVE 09/05/2016 2330   Sepsis Labs: @LABRCNTIP (procalcitonin:4,lacticidven:4)  ) Recent Results (from the past 240 hour(s))  MRSA PCR Screening     Status: None   Collection Time: 09/19/16 12:39 AM  Result Value Ref Range Status   MRSA by PCR NEGATIVE NEGATIVE Final     Comment:        The GeneXpert MRSA Assay (FDA approved for NASAL specimens only), is one component of a comprehensive MRSA colonization surveillance program. It is not intended to diagnose MRSA infection nor to guide or monitor treatment for MRSA infections.          Radiology Studies: Dg Chest 2 View  Result Date: 09/07/2016 CLINICAL DATA:  Weakness for 1 and half weeks. EXAM: CHEST  2 VIEW COMPARISON:  07/09/2016 FINDINGS: The cardiac silhouette is mildly enlarged. Mediastinal contours appear intact. There are bilateral pleural effusions with interstitial pulmonary edema. Postsurgical scarring in the right upper thorax is exaggerated by increased interstitial markings, and more nodular in appearance. No evidence of pneumothorax. Osseous structures are without acute abnormality. Soft tissues are grossly normal. IMPRESSION: Bilateral pleural effusion and interstitial pulmonary edema. Exaggerated linear and  nodular thickening in the right upper thorax, may represent postsurgical changes exaggerated by pulmonary edema or focal recurrence at lobectomy site. Electronically Signed   By: Fidela Salisbury M.D.   On: 09/17/2016 20:10        Scheduled Meds: . albuterol  2.5 mg Nebulization QID  . levothyroxine  100 mcg Oral QAC breakfast  . mouth rinse  15 mL Mouth Rinse BID  . mirtazapine  7.5 mg Oral QHS   Continuous Infusions: . sodium chloride 100 mL/hr at 09/19/16 0200  . sodium chloride       LOS: 0 days    Critical care time spent: 45 minutes. Greater than 50% of this time was spent in direct contact with the patient coordinating care.     Lelon Frohlich, MD [facility] Pager [phone]  If 7PM-7AM, please contact night-coverage [email or web address] Password [facility] 09/19/2016, 9:51 AM

## 2016-09-19 NOTE — H&P (Signed)
History and Physical    [name] NGE:[id number] DOB: October 04, 1940 DOA: 09/16/2016  PCP: Redmond School, MD  Patient coming from: home  Chief Complaint: weakness  HPI: [name] is a 76 y.o. female with medical history significant of stage 4 lung cancer,  Copd, htn, chronic anemia comes in with 2 weeks of weakness.  She has not been eating or drinking well.  She has no appetite.  Was just recently started on megace so has not noticed a difference yet.  She denies any fevers.  Has chronic cough.  No pain anywhere.  She has steadily lost weight.  No rashes.  No urinary symptoms.  She is on 2 liters [place] at home.  Pt found to be hypotensive in the ED and dehydrated.  Has been referred for admission for overall FTT.   Review of Systems: As per HPI otherwise 10 point review of systems negative.   Past Medical History:  Diagnosis Date  . AAA (abdominal aortic aneurysm) ([place])   . Anemia of chronic disease 11/14/2014  . Chronic kidney disease   . COPD (chronic obstructive pulmonary disease) ([place])   . Hypertension   . Lung cancer ([place])   . Pernicious anemia   . Primary cancer of right upper lobe of lung ([place]) 05/10/2012  . Stroke ([place])   . Thyroid disease     Past Surgical History:  Procedure Laterality Date  . ABDOMINAL HYSTERECTOMY    . APPENDECTOMY    . CHEST TUBE INSERTION  03/08/2012   Procedure: CHEST TUBE INSERTION;  Surgeon: Ivin Poot, MD;  Location: [place];  Service: Thoracic;  Laterality: Right;  . CHOLECYSTECTOMY    . COLONOSCOPY N/A 04/29/2014   Procedure: COLONOSCOPY;  Surgeon: Daneil Dolin, MD;  [facility];  Service: Endoscopy;  Laterality: N/A;  1245pm  . ESOPHAGOGASTRODUODENOSCOPY N/A 04/29/2014   Procedure: ESOPHAGOGASTRODUODENOSCOPY (EGD);  Surgeon: Daneil Dolin, MD;  [facility];  Service: Endoscopy;  Laterality: N/A;  Venia Minks DILATION N/A 04/29/2014   Procedure: Venia Minks DILATION;  Surgeon: Daneil Dolin, MD;  [facility];   Service: Endoscopy;  Laterality: N/A;  . RESECTION OF APICAL BLEB  03/17/2012   Procedure: RESECTION OF APICAL BLEB;  Surgeon: Ivin Poot, MD;  Location[facility] OR;  Service: Thoracic;  Laterality: Right;  stapling of bleb  . SAVORY DILATION N/A 04/29/2014   Procedure: SAVORY DILATION;  Surgeon: Daneil Dolin, MD;  [facility];  Service: Endoscopy;  Laterality: N/A;  . TEE WITHOUT CARDIOVERSION N/A 11/15/2014   Procedure: TRANSESOPHAGEAL ECHOCARDIOGRAM (TEE);  Surgeon: Sueanne Margarita, MD;  Location: [facility] ENDOSCOPY;  Service: Cardiovascular;  Laterality: N/A;  . TUBAL LIGATION    . VIDEO ASSISTED THORACOSCOPY  03/17/2012   Procedure: VIDEO ASSISTED THORACOSCOPY;  Surgeon: Ivin Poot, MD;  Location: [place];  Service: Thoracic;  Laterality: Right;     reports that she quit smoking about 10 years ago. Her smoking use included Cigarettes. She quit smokeless tobacco use about 10 years ago. She reports that she does not drink alcohol or use drugs.  Allergies  Allergen Reactions  . Codeine Nausea And Vomiting  . Penicillins Rash and Other (See Comments)    Has patient had a PCN reaction causing immediate rash, facial/tongue/throat swelling, SOB or lightheadedness with hypotension: No Has patient had a PCN reaction causing severe rash involving mucus membranes or skin necrosis: No Has patient had a PCN reaction that required hospitalization:  No Has patient had a PCN reaction occurring within the last 10 years: No If all of the above answers are "NO", then may proceed with Cephalosporin use.  Marland Kitchen Doxycycline Nausea And Vomiting  . Pravastatin Sodium Other (See Comments)    Reaction:  Muscle cramps   . Sulfa Antibiotics Other (See Comments)    Reaction:  Unknown     Family History  Problem Relation Age of Onset  . Cancer Mother   . Heart failure Brother   . Heart disease Brother        before age 44  . Colon cancer Neg Hx     Prior to Admission medications   Medication Sig  Start Date End Date Taking? Authorizing Provider  acetaminophen (TYLENOL) 500 MG tablet Take 1,000 mg by mouth every 6 (six) hours as needed for mild pain, moderate pain, fever or headache.    Yes [provider]  albuterol (PROVENTIL) (2.5 MG/3ML) 0.083% nebulizer solution Take 2.5 mg by nebulization 4 (four) times daily.    Yes [provider]  ALPRAZolam Duanne Moron) 1 MG tablet Take 1 mg by mouth 4 (four) times daily as needed for anxiety or sleep.    Yes [provider]  clindamycin (CLEOCIN T) 1 % external solution Apply topically 2 (two) times daily. Patient taking differently: Apply 1 application topically 2 (two) times daily as needed (for breakouts on face).  06/03/14  Yes Johnson, Adrena E, PA-C  Cyanocobalamin (VITAMIN B-12) 5000 MCG SUBL Place 5,000 mcg under the tongue daily.   Yes [provider]  ferrous sulfate 325 (65 FE) MG tablet Take 325 mg by mouth daily at 12 noon.    Yes [provider]  Fluticasone-Salmeterol (ADVAIR) 250-50 MCG/DOSE AEPB Inhale 1 puff into the lungs 2 (two) times daily.    Yes [provider]  guaiFENesin (MUCINEX) 600 MG 12 hr tablet Take 600 mg by mouth daily.    Yes [provider]  levothyroxine (SYNTHROID, LEVOTHROID) 100 MCG tablet Take 100 mcg by mouth daily before breakfast.    Yes [provider]  loperamide (IMODIUM) 2 MG capsule Take 4 mg by mouth daily.    Yes [provider]  loratadine (CLARITIN) 10 MG tablet Take 10 mg by mouth daily.    Yes [provider]  megestrol (MEGACE) 400 MG/10ML suspension Take 400 mg by mouth 2 (two) times daily.   Yes [provider]  mirtazapine (REMERON) 7.5 MG tablet Take 7.5 mg by mouth at bedtime.   Yes [provider]  osimertinib mesylate (TAGRISSO) 80 MG tablet Take 1 tablet (80 mg total) by mouth daily. 09/06/16  Yes Curt Bears, MD  prochlorperazine (COMPAZINE) 10 MG tablet Take 1 tablet (10 mg total)  by mouth every 6 (six) hours as needed for nausea or vomiting. 08/17/16  Yes Curt Bears, MD  traMADol (ULTRAM) 50 MG tablet Take 1 tablet (50 mg total) by mouth every 12 (twelve) hours as needed. Patient taking differently: Take 50 mg by mouth every 12 (twelve) hours as needed.  07/06/16  Yes Curt Bears, MD    Physical Exam: Vitals:   09/09/2016 2200 09/22/2016 2230 09/11/2016 2330 09/19/16 0000  BP: (!) 93/50 (!) 73/52 (!) 88/49 (!) 87/47  Pulse: 79 80 84 85  Resp: 18 20 20 18   Temp:      TempSrc:      SpO2: 100% 98% 98% 97%  Weight:      Height:  Constitutional: NAD, calm, comfortable,  Very cachectic Vitals:   09/24/2016 2200 09/12/2016 2230 09/25/2016 2330 09/19/16 0000  BP: (!) 93/50 (!) 73/52 (!) 88/49 (!) 87/47  Pulse: 79 80 84 85  Resp: 18 20 20 18   Temp:      TempSrc:      SpO2: 100% 98% 98% 97%  Weight:      Height:       Eyes: PERRL, lids and conjunctivae normal ENMT: Mucous membranes are moist. Posterior pharynx clear of any exudate or lesions.Normal dentition.  Neck: normal, supple, no masses, no thyromegaly Respiratory: clear to auscultation bilaterally, no wheezing, no crackles. Normal respiratory effort. No accessory muscle use.  Cardiovascular: Regular rate and rhythm, no murmurs / rubs / gallops. No extremity edema. 2+ pedal pulses. No carotid bruits.  Abdomen: no tenderness, no masses palpated. No hepatosplenomegaly. Bowel sounds positive.  Musculoskeletal: no clubbing / cyanosis. No joint deformity upper and lower extremities. Good ROM, no contractures. Normal muscle tone.  Skin: no rashes, lesions, ulcers. No induration Neurologic: CN 2-12 grossly intact. Sensation intact, DTR normal. Strength 5/5 in all 4.  Psychiatric: Normal judgment and insight. Alert and oriented x 3. Normal mood.    Labs on Admission: I have personally reviewed following labs and imaging studies  CBC:  Recent Labs Lab 09/26/2016 2021  WBC 3.1*  HGB 8.7*  HCT 26.9*  MCV  89.7  PLT 161*   Basic Metabolic Panel:  Recent Labs Lab 09/30/2016 2021  NA 139  K 4.4  CL 108  CO2 23  GLUCOSE 114*  BUN 31*  CREATININE 1.23*  CALCIUM 7.6*   GFR: Estimated Creatinine Clearance: 23.8 mL/min (A) (by C-G formula based on SCr of 1.23 mg/dL (H)). Liver Function Tests:  Recent Labs Lab 09/28/2016 2021  AST 24  ALT 14  ALKPHOS 192*  BILITOT 0.6  PROT 4.1*  ALBUMIN 1.4*    Cardiac Enzymes:  Recent Labs Lab 10/01/2016 2021  TROPONINI <0.03    CBG:  Recent Labs Lab 09/10/2016 1917  GLUCAP 112*    Urine analysis:    Component Value Date/Time   COLORURINE YELLOW 10/01/2016 2330   APPEARANCEUR CLEAR 09/12/2016 2330   LABSPEC 1.013 09/30/2016 2330   PHURINE 5.0 09/17/2016 2330   GLUCOSEU NEGATIVE 09/19/2016 2330   HGBUR NEGATIVE 09/06/2016 2330   BILIRUBINUR NEGATIVE 09/11/2016 2330   KETONESUR NEGATIVE 09/14/2016 2330   PROTEINUR NEGATIVE 09/07/2016 2330   UROBILINOGEN 0.2 03/16/2012 1140   NITRITE NEGATIVE 09/26/2016 2330   LEUKOCYTESUR NEGATIVE 09/25/2016 2330      Radiological Exams on Admission: Dg Chest 2 View  Result Date: 09/11/2016 CLINICAL DATA:  Weakness for 1 and half weeks. EXAM: CHEST  2 VIEW COMPARISON:  07/09/2016 FINDINGS: The cardiac silhouette is mildly enlarged. Mediastinal contours appear intact. There are bilateral pleural effusions with interstitial pulmonary edema. Postsurgical scarring in the right upper thorax is exaggerated by increased interstitial markings, and more nodular in appearance. No evidence of pneumothorax. Osseous structures are without acute abnormality. Soft tissues are grossly normal. IMPRESSION: Bilateral pleural effusion and interstitial pulmonary edema. Exaggerated linear and nodular thickening in the right upper thorax, may represent postsurgical changes exaggerated by pulmonary edema or focal recurrence at lobectomy site. Electronically Signed   By: Fidela Salisbury M.D.   On: 09/07/2016 20:10      Assessment/Plan 76 yo female with lung cancer and overall FTT  Principal Problem:   Hypotension- suspect this is due to dehydration. Lactic acid level nml.  cxr  with chronic pleural effusion.  ua neg.  Will check procalcitonin level, if normal will hold off on abx.  bp responding to ivf.  Cr up to 1.3 from 0.8 which is significant for her (37kg).    Active Problems:   AKI (acute kidney injury) ([place])- ivf   Dehydration- ivf   Primary cancer of right upper lobe of lung ([facility])- noted   COPD (chronic obstructive pulmonary disease) ([place])- noted, cont home o2   Chronic respiratory failure ([place])- as above   Anemia of chronic disease- stable   AAA (abdominal aortic aneurysm) ([place])- not surgical candidate for any intervention   Overall pt has very poor prognosis long term.  Pt seems to have poor insight into this.  DVT prophylaxis: scds  Code Status:  full Family Communication: daughter  Disposition Plan:  Per day team Consults called:  none Admission status: admission   Adi Doro A MD [facility]  If 7PM-7AM, please contact night-coverage [email or web address] Password [email or web address]  09/19/2016, 12:20 AM

## 2016-09-19 NOTE — Progress Notes (Signed)
Patient at this time remains hypotensive, patient + 2L bolus in ED, NS @100ml /hr currently. Patient unable to urinate at this time.  Patient bp 80/47 (58), HR 90, 96 3L [place], pediatric cuff utilized at this time. MD Shanon Brow notified at this time.  Patient to receive another 500cc NS bolus. Will continue to monitor the patient closely.

## 2016-09-19 NOTE — Progress Notes (Signed)
Called by radiology concerning CXR that was ordered post-PICC placement. She has right hemithorax opacification which is changed from admission. Likely CAP, but unable to exclude hemothorax, especially as this is post PICC. She was started on vanc/aztreonam earlier today. ABG: 7.1/60/69/15. Have had multiple discussions with family members with RN assistance regarding code status. She has an extremely poor long and short term prognosis given her age and co-morbidities especially her stage IV lung cancer. Have discussed that aggressive care would include intubation with sedation, possibility of chest tube placement with likely transfer to [facility] given lack of critical care support at [facility]. We have ultimately decided to make her a DNR and proceed with comfort care. She has obvious respiratory distress and intercostal retractions. Start PRN morphine and ativan. 3 daughters, son in law and niece are present during our discussions.  Domingo Mend, MD [facility] Pager: [phone]  Critical care time spent: 35 minutes

## 2016-09-19 NOTE — Progress Notes (Addendum)
Pharmacy Antibiotic Note  [name] is a 76 y.o. female admitted on 09/12/2016 with sepsis.  Pharmacy has been consulted for [facility] AND AZTREONAM dosing.  PCN allergy noted (rash).  Pt has small body habitus (wt = 35.2Kg).  Also asked to dose Lovenox for VTE px  Plan:  Vancomycin 750mg  x 1 then 500mg  IV q12hrs  Check Vanc trough at steady state or as warranted  Aztreonam 500mg  IV q8hrs  Lovenox 20mg  SQ q24hrs   Monitor renal fxn, CBC, progress, c/s  Deescalate ABX when clinically indicated  Height: 5\' 4"  (162.6 cm) Weight: 77 lb 9.6 oz (35.2 kg) IBW/kg (Calculated) : 54.7  Temp (24hrs), Avg:97.6 F (36.4 C), Min:97.4 F (36.3 C), Max:97.8 F (36.6 C)   Recent Labs Lab 09/23/2016 2021 09/19/16 0229  WBC 3.1* 5.0  CREATININE 1.23* 1.09*  LATICACIDVEN 1.6  --     Estimated Creatinine Clearance: 25.2 mL/min (A) (by C-G formula based on SCr of 1.09 mg/dL (H)).    Allergies  Allergen Reactions  . Codeine Nausea And Vomiting  . Penicillins Rash and Other (See Comments)    Has patient had a PCN reaction causing immediate rash, facial/tongue/throat swelling, SOB or lightheadedness with hypotension: No Has patient had a PCN reaction causing severe rash involving mucus membranes or skin necrosis: No Has patient had a PCN reaction that required hospitalization: No Has patient had a PCN reaction occurring within the last 10 years: No If all of the above answers are "NO", then may proceed with Cephalosporin use.  Marland Kitchen Doxycycline Nausea And Vomiting  . Pravastatin Sodium Other (See Comments)    Reaction:  Muscle cramps   . Sulfa Antibiotics Other (See Comments)    Reaction:  Unknown    Antimicrobials this admission: Vanc 6/17 >>  Aztreonam 6/17 >>   Dose adjustments this admission:  Microbiology results:  BCx: pending  UCx: pending   Sputum:    MRSA PCR:   Thank you for allowing pharmacy to be a part of this patient's care.  Hart Robinsons, PharmD Clinical  Pharmacist Pager:  [phone] 09/19/2016  Hart Robinsons A 09/19/2016 10:07 AM

## 2016-09-20 ENCOUNTER — Other Ambulatory Visit: Payer: Self-pay | Admitting: *Deleted

## 2016-09-20 ENCOUNTER — Encounter (HOSPITAL_COMMUNITY): Payer: Self-pay | Admitting: Primary Care

## 2016-09-20 ENCOUNTER — Telehealth: Payer: Self-pay | Admitting: Medical Oncology

## 2016-09-20 DIAGNOSIS — C3411 Malignant neoplasm of upper lobe, right bronchus or lung: Secondary | ICD-10-CM

## 2016-09-20 DIAGNOSIS — Z515 Encounter for palliative care: Secondary | ICD-10-CM

## 2016-09-20 DIAGNOSIS — E43 Unspecified severe protein-calorie malnutrition: Secondary | ICD-10-CM

## 2016-09-20 DIAGNOSIS — Z7189 Other specified counseling: Secondary | ICD-10-CM

## 2016-09-20 LAB — BASIC METABOLIC PANEL
ANION GAP: 8 (ref 5–15)
BUN: 29 mg/dL — ABNORMAL HIGH (ref 6–20)
CHLORIDE: 111 mmol/L (ref 101–111)
CO2: 18 mmol/L — ABNORMAL LOW (ref 22–32)
Calcium: 7.4 mg/dL — ABNORMAL LOW (ref 8.9–10.3)
Creatinine, Ser: 1.27 mg/dL — ABNORMAL HIGH (ref 0.44–1.00)
GFR calc Af Amer: 47 mL/min — ABNORMAL LOW (ref >60)
GFR, EST NON AFRICAN AMERICAN: 41 mL/min — AB (ref >60)
Glucose, Bld: 115 mg/dL — ABNORMAL HIGH (ref 65–99)
POTASSIUM: 4.8 mmol/L (ref 3.5–5.1)
SODIUM: 137 mmol/L (ref 135–145)

## 2016-09-20 LAB — CBC
HEMATOCRIT: 29.3 % — AB (ref 36.0–46.0)
HEMOGLOBIN: 9.1 g/dL — AB (ref 12.0–15.0)
MCH: 29 pg (ref 26.0–34.0)
MCHC: 31.1 g/dL (ref 30.0–36.0)
MCV: 93.3 fL (ref 78.0–100.0)
Platelets: 160 10*3/uL (ref 150–400)
RBC: 3.14 MIL/uL — ABNORMAL LOW (ref 3.87–5.11)
RDW: 18.9 % — ABNORMAL HIGH (ref 11.5–15.5)
WBC: 12.2 10*3/uL — AB (ref 4.0–10.5)

## 2016-09-20 LAB — PROCALCITONIN: Procalcitonin: 47.02 ng/mL

## 2016-09-20 MED ORDER — SODIUM CHLORIDE 0.9 % IV SOLN
1.0000 mg/h | INTRAVENOUS | Status: DC
  Administered 2016-09-20: 1 mg/h via INTRAVENOUS
  Filled 2016-09-20: qty 10

## 2016-09-21 ENCOUNTER — Other Ambulatory Visit: Payer: Self-pay | Admitting: *Deleted

## 2016-09-21 NOTE — Progress Notes (Signed)
Pt expired 09/24/2016 at 2251. Family at bedside. Two RN Caleen Jobs and Nestor Lewandowsky pronounced. Dr Shanon Brow notified and Death certificate signed. [place] [facility] notified. Pt is not a donor candidate. Family took pts belongings and were escorted out by security. Post mortem care given and Bed control notified. Pt is in room awaiting [facility]. 245mg  of Morphine drip wasted with Greggory Keen RN

## 2016-09-21 NOTE — Patient Outreach (Signed)
[facility]) Care Management  09/21/2016  [name] [birth year] [id number]  With regret, I learned from [name]'s family today that she expired last evening. Per her family's request, I reached out to each member of the care team to notify them of her passing. I also reached out to [facility] to ask that they contact the family about coordination of an equipment pick up time.   Plan: Case closure.    [facility] Management  [phone]

## 2016-09-24 LAB — CULTURE, BLOOD (ROUTINE X 2)
CULTURE: NO GROWTH
CULTURE: NO GROWTH
Special Requests: ADEQUATE
Special Requests: ADEQUATE

## 2016-09-29 NOTE — Discharge Summary (Signed)
DEATH SUMMARY  Patient was a 76 y/o woman admitted to the hospital on 09/16/2016 due to weakness with h/o stage IV lung cancer. She deteriorated into septic shock. Family finally decided to transition to comfort care measures. She was put on a morphine drip due to RR in the 40s with obvious signs of distress. She subsequent;y expired on 09-23-16 at Jun 16, 2249.  Causes of Death: Acute Hypoxemic respiratory Failure Septic Shock CAP Stage IV Lung Cancer Severe Protein-Caloric Malnutrition  Domingo Mend, MD [facility] Pager: [phone]

## 2016-10-03 NOTE — Consult Note (Signed)
Consultation Note Date: September 29, 2016   Patient Name: [name]  DOB: [birth year]  MRN: [id number]  Age / Sex: 76 y.o., female  PCP: Rose School, MD Referring Physician: Isaac Reese, [name]*  Reason for Consultation: Establishing goals of care and Psychosocial/spiritual support  HPI/Patient Profile: 76 y.o. 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   Clinical Assessment and Goals of Care: [name] is lying quietly in bed. She does not open her eyes are trying to respond when I call her name or touch her face. She is unable to communicate in any way at this time. Present today at bedside is daughter and son-in-law [name] and [name], and daughter [name].  We go to my office for a family meeting.  [name] and Bigelow share that [name] moved into their home approximately 10 years ago after being hospitalized for COPD. She has been married to [name] (daughter [name]'s father) for over 24 years now. They say that [name] is basically "homebound". They share that their mother and [name] both requests that the 3 daughters [name], and [name] make health care decisions.  We talk about [name]'s chronic health concerns. [name] is the main caregiver who assists with medical treatments and appointments. They tell a story of ups and downs since she was found to have cancer with a lobectomy in 2013. Son-in-law all [name] states, "she's bounced back so many times". He shares that over the last few weeks though [name] has become weaker, laying down more, not as strong basically just going back and forth from the bedroom to the bathroom.  We talk about the  last visit with [name], oncologist. [name] states that she felt the chemo therapy pill was working, but [name] reminds her that the tumors had increased in size.   I share a diagram of the chronic illness pathway, what is normal and expected. I share the difference for people who have cancer.  Both [name] and [name] are tearful. They share that [name] did not want to come to the hospital. We review lab reports including albumin of 1.4, we discussed chest x-ray reports.  We talk about normal expected changes that occur near end of life. With permission, prognosis is discussed. I share that we cannot change what is happening, but we can make sure [name] time is comfortable. We talk about sleeping more, eating and interacting with. Also share that being unable to swallow well or close eyes when resting is an indicator that time is short. I share with [name] and [name] that I believe [name] has hours to days.  We talk about unburdening [name] from medicines and treatments that are not changing what is happening. Family agrees to unburden [name] from Lovenox injection,  but is unwilling/unable to untether her from IV fluids, IV antibiotics, or lab draws at this time. [name] states that if time is short her preferences to return to her mother's room.  Daughter [name] arrives as were talking about time is short and discussing albumin. I share with family that this seems to be a shock to them that their mother is so sick. [name] is very tearful and states, "it's hard, it's our mama". [name] again states that everyone continued to feel that [name] would "bounce back". I share that I will return later in the afternoon to answer further questions.   Healthcare power of attorney NEXT OF KIN - [name] is legally married to spouse [name]. Mrs. He state did previously that she wants her 3 daughters [name], and [name] to make health care decisions if she did is unable. [name] states this is his wish  also.  [name] is essentially homebound. There is no legal paperwork.   SUMMARY OF RECOMMENDATIONS   I share with daughter and son-in-law [name] and [name], and daughter [name] that [name]'s time is likely short. We talk about unburdening her from medications that are not  changing what's happening. They agree to DC Lovenox, but at this point cannot agree to discontinue IV antibiotics or fluids.  Code Status/Advance Care Planning:  DNR  Symptom Management:   per hospitalist, no additional needs at this time.  We discuss increase in morphine dose and frequency. No decision to increase morphine at this time.  Palliative Prophylaxis:   Frequent Pain Assessment, Oral Care and Turn Reposition  Additional Recommendations (Limitations, Scope, Preferences):  Family understands that [name] time is likely short, that they cannot discontinue IV antibiotics or lab draws at this time.  Psycho-social/Spiritual:   Desire for further Chaplaincy support:no  Additional Recommendations: Education on Hospice  Prognosis:   Hours - Days  Discharge Planning: Anticipated Hospital Death      Primary Diagnoses: Present on Admission: . Hypotension . AAA (abdominal aortic aneurysm) ([place]) . Anemia of chronic disease . Chronic respiratory failure ([place]) . COPD (chronic obstructive pulmonary disease) ([place]) . Primary cancer of right upper lobe of lung ([place]) . AKI (acute kidney injury) ([place]) . Dehydration   I have reviewed the medical record, interviewed the patient and family, and examined the patient. The following aspects are pertinent.  Past Medical History:  Diagnosis Date  . AAA (abdominal aortic aneurysm) ([place])   . Anemia of chronic disease 11/14/2014  . Chronic kidney disease   . COPD (chronic obstructive pulmonary disease) ([place])   . Hypertension   . Lung cancer ([place])   . Pernicious anemia   . Primary cancer of right upper lobe of lung ([place]) 05/10/2012  . Stroke ([place])     . Thyroid disease    Social History   Social History  . Marital status: Married    Spouse name: N/A  . Number of children: 4  . Years of education: N/A   Social History Main Topics  . Smoking status: Former Smoker    Types: Cigarettes    Quit date: 04/05/2006  . Smokeless tobacco: Former Systems developer    Quit date: 03/07/2006  . Alcohol use No  . Drug use: No  . Sexual activity: Not Currently   Other Topics Concern  . None   Social History Narrative  . None   Family History  Problem Relation Age of Onset  . Cancer Mother   . Heart failure  Brother   . Heart disease Brother        before age 59  . Colon cancer Neg Hx    Scheduled Meds: . mouth rinse  15 mL Mouth Rinse BID  . scopolamine  1 patch Transdermal Q72H   Continuous Infusions: . aztreonam Stopped (10-02-2016 0530)  . vancomycin 500 mg (10/02/16 0934)   PRN Meds:.LORazepam, morphine injection Medications Prior to Admission:  Prior to Admission medications   Medication Sig Start Date End Date Taking? Authorizing Provider  acetaminophen (TYLENOL) 500 MG tablet Take 1,000 mg by mouth every 6 (six) hours as needed for mild pain, moderate pain, fever or headache.    Yes [provider]  albuterol (PROVENTIL) (2.5 MG/3ML) 0.083% nebulizer solution Take 2.5 mg by nebulization 4 (four) times daily.    Yes [provider]  ALPRAZolam Duanne Moron) 1 MG tablet Take 1 mg by mouth 4 (four) times daily as needed for anxiety or sleep.    Yes [provider]  clindamycin (CLEOCIN T) 1 % external solution Apply topically 2 (two) times daily. Patient taking differently: Apply 1 application topically 2 (two) times daily as needed (for breakouts on face).  06/03/14  Yes Johnson, Adrena E, PA-C  Cyanocobalamin (VITAMIN B-12) 5000 MCG SUBL Place 5,000 mcg under the tongue daily.   Yes [provider]  ferrous sulfate 325 (65 FE) MG tablet Take 325 mg by mouth daily at 12 noon.    Yes [provider]   Fluticasone-Salmeterol (ADVAIR) 250-50 MCG/DOSE AEPB Inhale 1 puff into the lungs 2 (two) times daily.    Yes [provider]  guaiFENesin (MUCINEX) 600 MG 12 hr tablet Take 600 mg by mouth daily.    Yes [provider]  levothyroxine (SYNTHROID, LEVOTHROID) 100 MCG tablet Take 100 mcg by mouth daily before breakfast.    Yes [provider]  loperamide (IMODIUM) 2 MG capsule Take 4 mg by mouth daily.    Yes [provider]  loratadine (CLARITIN) 10 MG tablet Take 10 mg by mouth daily.    Yes [provider]  megestrol (MEGACE) 400 MG/10ML suspension Take 400 mg by mouth 2 (two) times daily.   Yes [provider]  mirtazapine (REMERON) 7.5 MG tablet Take 7.5 mg by mouth at bedtime.   Yes [provider]  osimertinib mesylate (TAGRISSO) 80 MG tablet Take 1 tablet (80 mg total) by mouth daily. 09/06/16  Yes Curt Bears, MD  prochlorperazine (COMPAZINE) 10 MG tablet Take 1 tablet (10 mg total) by mouth every 6 (six) hours as needed for nausea or vomiting. 08/17/16  Yes Curt Bears, MD  traMADol (ULTRAM) 50 MG tablet Take 1 tablet (50 mg total) by mouth every 12 (twelve) hours as needed. Patient taking differently: Take 50 mg by mouth every 12 (twelve) hours as needed.  07/06/16  Yes Curt Bears, MD   Allergies  Allergen Reactions  . Codeine Nausea And Vomiting  . Penicillins Rash and Other (See Comments)    Has patient had a PCN reaction causing immediate rash, facial/tongue/throat swelling, SOB or lightheadedness with hypotension: No Has patient had a PCN reaction causing severe rash involving mucus membranes or skin necrosis: No Has patient had a PCN reaction that required hospitalization: No Has patient had a PCN reaction occurring within the last 10 years: No If all of the above answers are "NO", then may proceed with Cephalosporin use.  Marland Kitchen Doxycycline Nausea And Vomiting  . Pravastatin Sodium Other (See Comments)  Reaction:  Muscle cramps   . Sulfa Antibiotics Other (See Comments)    Reaction:  Unknown    Review of Systems  Unable to perform ROS: Acuity of condition    Physical Exam  Constitutional: No distress.  Frail and thin, appears chronically/acutely ill, does not respond to voice or touch  HENT:  Head: Atraumatic.  Temporal wasting, cachectic  Cardiovascular: Normal rate and regular rhythm.   Pulmonary/Chest: Effort normal. No respiratory distress.  Abdominal: Soft. She exhibits no distension.  Musculoskeletal: She exhibits no edema.  Cachectic  Neurological:  Does not respond to voice or touch, unable to make even basic needs known  Skin: Skin is warm and dry. There is pallor.  Nursing note and vitals reviewed.   Vital Signs: BP (!) 77/43   Pulse 83   Temp (!) 96.9 F (36.1 C)   Resp (!) 25   Ht 5\' 4"  (1.626 m)   Wt 38.1 kg (83 lb 15.9 oz)   SpO2 91%   BMI 14.42 kg/m  Pain Assessment: PAINAD   Pain Score: Asleep   SpO2: SpO2: 91 % O2 Device:SpO2: 91 % O2 Flow Rate: .O2 Flow Rate (L/min): 4 L/min  IO: Intake/output summary:  Intake/Output Summary (Last 24 hours) at 2016/10/16 1203 Last data filed at 10-16-2016 0934  Gross per 24 hour  Intake              170 ml  Output                0 ml  Net              170 ml    LBM: Last BM Date: 10/01/2016 Baseline Weight: Weight: 37.5 kg (82 lb 9 oz) Most recent weight: Weight: 38.1 kg (83 lb 15.9 oz)     Palliative Assessment/Data:   Flowsheet Rows     Most Recent Value  Intake Tab  Referral Department  Hospitalist  Unit at Time of Referral  ICU  Palliative Care Primary Diagnosis  Sepsis/Infectious Disease  Date Notified  10-16-16  Palliative Care Type  New Palliative care  Reason for referral  Clarify Goals of Care, End of Life Care Assistance, Psychosocial or Spiritual support  Date of Admission  10/01/2016  Date first seen by Palliative Care  16-Oct-2016  # of days Palliative referral response time  0 Day(s)  # of  days IP prior to Palliative referral  2  Clinical Assessment  Palliative Performance Scale Score  10%  Pain Max last 24 hours  Not able to report  Pain Min Last 24 hours  Not able to report  Dyspnea Max Last 24 Hours  Not able to report  Dyspnea Min Last 24 hours  Not able to report  Psychosocial & Spiritual Assessment  Palliative Care Outcomes  Patient/Family meeting held?  Yes  Who was at the meeting?  Daughter and son-in-law [name] and [name], daughter [name]  Palliative Care Outcomes  Provided advance care planning, Provided psychosocial or spiritual support  Patient/Family wishes: Interventions discontinued/not started   Mechanical Ventilation      Time In: 1050 Time Out: 1205 Time Total: 75 Greater than 50%  of this time was spent counseling and coordinating care related to the above assessment and plan.  Signed by: Drue Novel, NP   Please contact Palliative Medicine Team phone at [phone] for questions and concerns.  For individual provider: See Shea Evans

## 2016-10-03 NOTE — Telephone Encounter (Signed)
Pt in ICU at [facility].

## 2016-10-03 NOTE — Patient Outreach (Signed)
[facility]) Care Management  09/21/2016  [name] Jun 19, 1940 [id number]  Noted [name]'s admission on 10/02/2016 with weakness, dehydration, and hypotension. I visited with [name] last week at her home. She was very interested in nutrition support and I referred her to our community dietician. Agree that the patient and her daughter have limited insight into condition and prognosis.   Plan: I will continue to follow [name]'s progress while hospitalized and will follow up with her upon discharge to assist with transition of care needs and/or transition to other community agencies as appropriate.    [place] Management  [phone]

## 2016-10-03 NOTE — Progress Notes (Signed)
Patient was noted to have increased respirations in the 30s and 40s. After giving patient Morphine push there was no change in RR. I sat down and asked family if they had thought anymore about the Morphine drip and the daughter and granddaughter said that they think it might be a good idea so that we can keep patient's breathing more controlled and keep her pain free. Spoke with Quinn Axe, NP to notify her of families decision.   Patient respirations have now decreased and she seems more comfortable after giving her a bath and repositioning her. At this post she has been on the drip for a couple of hours. Family in room.

## 2016-10-03 NOTE — Progress Notes (Signed)
Nutrition Brief Note  Chart reviewed. Pt is transitioning to comfort care.  No further nutrition interventions warranted at this time.  Please re-consult as needed.    Colman Cater MS,RD,CSG,LDN Office: [phone] Pager: [phone]

## 2016-10-03 NOTE — Progress Notes (Signed)
PROGRESS NOTE    [name]  TDV:[id number] DOB: 08/15/1940 DOA: 09/22/2016 PCP: Redmond School, MD     Brief Narrative:  76 y/o woman admitted from home on 6/17 due to weakness. H/o Stage IV Lung Cancer. Persistent hypotension on admission, diagnosed with septic shock. Started on broad spectrum abx.   Assessment & Plan:   Principal Problem:   Severe sepsis with septic shock (HCC) Active Problems:   Primary cancer of right upper lobe of lung (HCC)   COPD (chronic obstructive pulmonary disease) (HCC)   Chronic respiratory failure (HCC)   Anemia of chronic disease   AAA (abdominal aortic aneurysm) (HCC)   Hypotension   AKI (acute kidney injury) ([place])   Dehydration   Weakness   Severe protein-calorie malnutrition (HCC)   Acute respiratory failure with hypoxia and hypercapnia (HCC)   Palliative care encounter   Goals of care, counseling/discussion   Acute Hypoxemic respiratory Failure Septic Shock CAP Stage IV Lung Cancer Severe Protein-Caloric Malnutrition  -Decision was made yesterday to transition to comfort care. Family is having a hard time coming to grips that their mother's time is near. -She remains unresponsive today. -Family is yet unable to make the decision to untether her from abx. Since MRSA PCR is negative, will DC vanc today and continue zosyn. -Later this afternoon she is noted to have increased resp distress RR in the 40s. Family has now agreed to a morphine drip for comfort.   DVT prophylaxis: None Code Status: DNR Family Communication: 2 daughters at bedside Disposition Plan: Hospital death (hours to days)  Consultants:   Palliative Care  Procedures:   None  Antimicrobials:  Anti-infectives    Start     Dose/Rate Route Frequency Ordered Stop   09/19/16 2200  vancomycin (VANCOCIN) 500 mg in sodium chloride 0.9 % 100 mL IVPB  Status:  Discontinued     500 mg 100 mL/hr over 60 Minutes Intravenous Every 12 hours 09/19/16 0959 2016-10-04 1508   09/19/16 1100  vancomycin (VANCOCIN) IVPB 750 mg/150 ml premix     750 mg 150 mL/hr over 60 Minutes Intravenous  Once 09/19/16 0956 09/19/16 1459   09/19/16 1100  aztreonam (AZACTAM) 500 mg in dextrose 5 % 50 mL IVPB     500 mg 100 mL/hr over 30 Minutes Intravenous Every 8 hours 09/19/16 1004         Subjective: Unresponsive lying in bed.  Objective: Vitals:   October 04, 2016 0934 2016/10/04 1019 2016-10-04 1200 04-Oct-2016 1400  BP:  (!) 77/43 (!) 79/47 (!) 73/42  Pulse:  83 84 85  Resp:  (!) 25 (!) 33 (!) 30  Temp:      TempSrc:      SpO2:  91% 92% 90%  Weight: 38.1 kg (83 lb 15.9 oz)     Height: 5\' 4"  (1.626 m)       Intake/Output Summary (Last 24 hours) at 2016-10-04 1659 Last data filed at Oct 04, 2016 1537  Gross per 24 hour  Intake              360 ml  Output                0 ml  Net              360 ml   Filed Weights   09/19/2016 1846 09/19/16 0230 10-04-16 0934  Weight: 37.5 kg (82 lb 9 oz) 35.2 kg (77 lb 9.6 oz) 38.1 kg (83 lb 15.9 oz)    Examination:  Unresponsive, mottling of legs. Rest of exam is deferred due to her comfort care status.    Data Reviewed: I have personally reviewed following labs and imaging studies  CBC:  Recent Labs Lab 09/27/2016 2021 09/19/16 0229 2016/10/17 0432  WBC 3.1* 5.0 12.2*  HGB 8.7* 8.7* 9.1*  HCT 26.9* 27.0* 29.3*  MCV 89.7 90.9 93.3  PLT 111* 132* 607   Basic Metabolic Panel:  Recent Labs Lab 09/15/2016 2021 09/19/16 0229 2016-10-17 0432  NA 139 138 137  K 4.4 4.3 4.8  CL 108 111 111  CO2 23 21* 18*  GLUCOSE 114* 87 115*  BUN 31* 28* 29*  CREATININE 1.23* 1.09* 1.27*  CALCIUM 7.6* 7.3* 7.4*  MG  --  1.8  --   PHOS  --  3.5  --    GFR: Estimated Creatinine Clearance: 23.4 mL/min (A) (by C-G formula based on SCr of 1.27 mg/dL (H)). Liver Function Tests:  Recent Labs Lab 09/03/2016 2021  AST 24  ALT 14  ALKPHOS 192*  BILITOT 0.6  PROT 4.1*  ALBUMIN 1.4*   No results for input(s): LIPASE, AMYLASE in the last 168  hours. No results for input(s): AMMONIA in the last 168 hours. Coagulation Profile: No results for input(s): INR, PROTIME in the last 168 hours. Cardiac Enzymes:  Recent Labs Lab 10/01/2016 2021  TROPONINI <0.03   BNP (last 3 results) No results for input(s): PROBNP in the last 8760 hours. HbA1C: No results for input(s): HGBA1C in the last 72 hours. CBG:  Recent Labs Lab 10/01/2016 1917  GLUCAP 112*   Lipid Profile: No results for input(s): CHOL, HDL, LDLCALC, TRIG, CHOLHDL, LDLDIRECT in the last 72 hours. Thyroid Function Tests: No results for input(s): TSH, T4TOTAL, FREET4, T3FREE, THYROIDAB in the last 72 hours. Anemia Panel:  Recent Labs  09/11/2016 2021  VITAMINB12 2,707*   Urine analysis:    Component Value Date/Time   COLORURINE YELLOW 10/02/2016 2330   APPEARANCEUR CLEAR 09/29/2016 2330   LABSPEC 1.013 09/28/2016 2330   PHURINE 5.0 09/12/2016 2330   GLUCOSEU NEGATIVE 09/14/2016 2330   HGBUR NEGATIVE 09/11/2016 2330   BILIRUBINUR NEGATIVE 09/30/2016 2330   KETONESUR NEGATIVE 09/06/2016 2330   PROTEINUR NEGATIVE 10/01/2016 2330   UROBILINOGEN 0.2 03/16/2012 1140   NITRITE NEGATIVE 09/27/2016 2330   LEUKOCYTESUR NEGATIVE 09/06/2016 2330   Sepsis Labs: @LABRCNTIP (procalcitonin:4,lacticidven:4)  ) Recent Results (from the past 240 hour(s))  MRSA PCR Screening     Status: None   Collection Time: 09/19/16 12:39 AM  Result Value Ref Range Status   MRSA by PCR NEGATIVE NEGATIVE Final    Comment:        The GeneXpert MRSA Assay (FDA approved for NASAL specimens only), is one component of a comprehensive MRSA colonization surveillance program. It is not intended to diagnose MRSA infection nor to guide or monitor treatment for MRSA infections.   Culture, blood (Routine X 2) w Reflex to ID Panel     Status: None (Preliminary result)   Collection Time: 09/19/16  9:27 AM  Result Value Ref Range Status   Specimen Description BLOOD RIGHT ARM  Final   Special  Requests   Final    BOTTLES DRAWN AEROBIC AND ANAEROBIC Blood Culture adequate volume   Culture NO GROWTH < 24 HOURS  Final   Report Status PENDING  Incomplete  Culture, blood (Routine X 2) w Reflex to ID Panel     Status: None (Preliminary result)   Collection Time: 09/19/16  9:36 AM  Result Value Ref Range Status   Specimen Description BLOOD RIGHT WRIST  Final   Special Requests   Final    BOTTLES DRAWN AEROBIC AND ANAEROBIC Blood Culture adequate volume   Culture NO GROWTH < 24 HOURS  Final   Report Status PENDING  Incomplete         Radiology Studies: Dg Chest 2 View  Result Date: 09/06/2016 CLINICAL DATA:  Weakness for 1 and half weeks. EXAM: CHEST  2 VIEW COMPARISON:  07/09/2016 FINDINGS: The cardiac silhouette is mildly enlarged. Mediastinal contours appear intact. There are bilateral pleural effusions with interstitial pulmonary edema. Postsurgical scarring in the right upper thorax is exaggerated by increased interstitial markings, and more nodular in appearance. No evidence of pneumothorax. Osseous structures are without acute abnormality. Soft tissues are grossly normal. IMPRESSION: Bilateral pleural effusion and interstitial pulmonary edema. Exaggerated linear and nodular thickening in the right upper thorax, may represent postsurgical changes exaggerated by pulmonary edema or focal recurrence at lobectomy site. Electronically Signed   By: Fidela Salisbury M.D.   On: 09/30/2016 20:10   Dg Chest Port 1 View  Result Date: 09/19/2016 CLINICAL DATA:  PERIPHERAL LINE PLACEMENT, HISTORY OF HTN, CANCER, COPD EXAM: PORTABLE CHEST 1 VIEW COMPARISON:  09/07/2016 FINDINGS: Patient is rotated towards the right. There has been interval placement of a left-sided PICC line, tip projecting over the right hemithorax, and possibly within the superior vena cava. However, followup image with better positioning is recommended. There has been interval development of near complete opacity of the  right hemithorax. There is pulmonary edema. Left pleural effusion, increased. IMPRESSION: 1. New near complete opacity of the right hemithorax which may be at least partially artifactual due to patient positioning but is concerning for possible atelectasis, pleural effusion, hemothorax, or pneumothorax. Follow-up chest x-ray with better positioning or CT scan of the chest is indicated. 2. New PICC line tip may be within the superior vena cava but can be assessed on follow-up. Critical Value/emergent results were called by telephone at the time of interpretation on 09/19/2016 at 1:48 pm to Dr. Lelon Frohlich , who verbally acknowledged these results. Electronically Signed   By: Nolon Nations M.D.   On: 09/19/2016 13:48   Dg Chest Port 1v Same Day  Result Date: 09/19/2016 CLINICAL DATA:  Peripheral line placement EXAM: PORTABLE CHEST 1 VIEW COMPARISON:  09/19/2016 FINDINGS: Left arm PICC tip unchanged from earlier today. The catheter crosses the midline with the tip in the region of the superior vena cava or possibly a branch vein. Complete collapse of the right lung also unchanged. Mediastinal structures shifted to the right. Small left effusion unchanged. Mild airspace disease on the left may represent edema or pneumonia. IMPRESSION: Left arm PICC tip crosses the midline and appears to be in the SVC. Complete collapse of the right lung as noted earlier today Small left effusion and airspace disease the left, probable pulmonary edema. Electronically Signed   By: Franchot Gallo M.D.   On: 09/19/2016 13:48        Scheduled Meds: . mouth rinse  15 mL Mouth Rinse BID  . scopolamine  1 patch Transdermal Q72H   Continuous Infusions: . aztreonam Stopped (2016/10/11 1615)  . morphine 2 mg/hr (10-11-16 1649)     LOS: 1 day    Time spent: 15 minutes. Greater than 50% of this time was spent in direct contact with the patient coordinating care.     Lelon Frohlich, MD [facility] Pager [phone]  If 7PM-7AM, please contact night-coverage [email or web address] Password [facility] 20-Oct-2016, 4:59 PM

## 2016-10-03 NOTE — Telephone Encounter (Signed)
err

## 2016-10-03 DEATH — deceased

## 2017-01-24 ENCOUNTER — Ambulatory Visit: Payer: PPO | Admitting: Surgery
